# Patient Record
Sex: Female | Born: 1945 | Race: White | Hispanic: No | Smoking: Former smoker
Health system: Southern US, Community
[De-identification: ages and names within clinical notes are randomized; demographics above are authoritative.]

## PROBLEM LIST (undated history)

## (undated) DIAGNOSIS — R011 Cardiac murmur, unspecified: Secondary | ICD-10-CM

## (undated) DIAGNOSIS — M1990 Unspecified osteoarthritis, unspecified site: Secondary | ICD-10-CM

## (undated) DIAGNOSIS — E785 Hyperlipidemia, unspecified: Secondary | ICD-10-CM

## (undated) DIAGNOSIS — F419 Anxiety disorder, unspecified: Secondary | ICD-10-CM

## (undated) DIAGNOSIS — J439 Emphysema, unspecified: Secondary | ICD-10-CM

## (undated) DIAGNOSIS — E039 Hypothyroidism, unspecified: Secondary | ICD-10-CM

## (undated) DIAGNOSIS — N840 Polyp of corpus uteri: Secondary | ICD-10-CM

## (undated) DIAGNOSIS — F329 Major depressive disorder, single episode, unspecified: Secondary | ICD-10-CM

## (undated) DIAGNOSIS — H269 Unspecified cataract: Secondary | ICD-10-CM

## (undated) DIAGNOSIS — E669 Obesity, unspecified: Secondary | ICD-10-CM

## (undated) DIAGNOSIS — C55 Malignant neoplasm of uterus, part unspecified: Secondary | ICD-10-CM

## (undated) DIAGNOSIS — J302 Other seasonal allergic rhinitis: Secondary | ICD-10-CM

## (undated) DIAGNOSIS — I10 Essential (primary) hypertension: Secondary | ICD-10-CM

## (undated) DIAGNOSIS — E119 Type 2 diabetes mellitus without complications: Secondary | ICD-10-CM

## (undated) DIAGNOSIS — G4730 Sleep apnea, unspecified: Secondary | ICD-10-CM

## (undated) DIAGNOSIS — F32A Depression, unspecified: Secondary | ICD-10-CM

## (undated) DIAGNOSIS — K589 Irritable bowel syndrome without diarrhea: Secondary | ICD-10-CM

## (undated) HISTORY — DX: Obesity, unspecified: E66.9

## (undated) HISTORY — DX: Type 2 diabetes mellitus without complications: E11.9

## (undated) HISTORY — DX: Unspecified cataract: H26.9

## (undated) HISTORY — DX: Polyp of corpus uteri: N84.0

## (undated) HISTORY — PX: EYE SURGERY: SHX253

## (undated) HISTORY — DX: Major depressive disorder, single episode, unspecified: F32.9

## (undated) HISTORY — DX: Malignant neoplasm of uterus, part unspecified: C55

## (undated) HISTORY — DX: Depression, unspecified: F32.A

## (undated) HISTORY — DX: Hyperlipidemia, unspecified: E78.5

## (undated) HISTORY — DX: Emphysema, unspecified: J43.9

## (undated) HISTORY — DX: Irritable bowel syndrome, unspecified: K58.9

## (undated) HISTORY — DX: Unspecified osteoarthritis, unspecified site: M19.90

---

## 1992-07-15 HISTORY — PX: PLANTAR FASCIA SURGERY: SHX746

## 1999-02-14 ENCOUNTER — Ambulatory Visit (HOSPITAL_COMMUNITY): Admission: RE | Admit: 1999-02-14 | Discharge: 1999-02-14 | Payer: Self-pay | Admitting: Gastroenterology

## 1999-08-31 ENCOUNTER — Other Ambulatory Visit: Admission: RE | Admit: 1999-08-31 | Discharge: 1999-08-31 | Payer: Self-pay | Admitting: Obstetrics and Gynecology

## 2000-07-28 ENCOUNTER — Other Ambulatory Visit: Admission: RE | Admit: 2000-07-28 | Discharge: 2000-07-28 | Payer: Self-pay | Admitting: Obstetrics and Gynecology

## 2001-08-05 ENCOUNTER — Other Ambulatory Visit: Admission: RE | Admit: 2001-08-05 | Discharge: 2001-08-05 | Payer: Self-pay | Admitting: Obstetrics and Gynecology

## 2002-08-05 ENCOUNTER — Other Ambulatory Visit: Admission: RE | Admit: 2002-08-05 | Discharge: 2002-08-05 | Payer: Self-pay | Admitting: Obstetrics and Gynecology

## 2003-09-05 ENCOUNTER — Other Ambulatory Visit: Admission: RE | Admit: 2003-09-05 | Discharge: 2003-09-05 | Payer: Self-pay | Admitting: Obstetrics and Gynecology

## 2004-09-04 ENCOUNTER — Ambulatory Visit (HOSPITAL_COMMUNITY): Admission: RE | Admit: 2004-09-04 | Discharge: 2004-09-04 | Payer: Self-pay | Admitting: Gastroenterology

## 2004-10-29 ENCOUNTER — Other Ambulatory Visit: Admission: RE | Admit: 2004-10-29 | Discharge: 2004-10-29 | Payer: Self-pay | Admitting: Obstetrics and Gynecology

## 2005-11-20 ENCOUNTER — Other Ambulatory Visit: Admission: RE | Admit: 2005-11-20 | Discharge: 2005-11-20 | Payer: Self-pay | Admitting: Obstetrics and Gynecology

## 2005-12-13 ENCOUNTER — Encounter: Payer: Self-pay | Admitting: Family Medicine

## 2006-07-16 ENCOUNTER — Ambulatory Visit: Payer: Self-pay | Admitting: Internal Medicine

## 2006-07-16 LAB — CONVERTED CEMR LAB
Calcium: 9.6 mg/dL (ref 8.4–10.5)
GFR calc non Af Amer: 91 mL/min
Glomerular Filtration Rate, Af Am: 111 mL/min/{1.73_m2}
Glucose, Bld: 93 mg/dL (ref 70–99)
Hemoglobin: 15.9 g/dL — ABNORMAL HIGH (ref 12.0–15.0)
MCV: 90.2 fL (ref 78.0–100.0)
Platelets: 237 10*3/uL (ref 150–400)
RDW: 13.1 % (ref 11.5–14.6)
Sodium: 139 meq/L (ref 135–145)
Total Bilirubin: 0.6 mg/dL (ref 0.3–1.2)
WBC: 6 10*3/uL (ref 4.5–10.5)

## 2006-07-23 ENCOUNTER — Encounter: Admission: RE | Admit: 2006-07-23 | Discharge: 2006-07-23 | Payer: Self-pay | Admitting: Internal Medicine

## 2007-01-13 ENCOUNTER — Ambulatory Visit: Payer: Self-pay | Admitting: Family Medicine

## 2007-01-13 DIAGNOSIS — G454 Transient global amnesia: Secondary | ICD-10-CM

## 2007-01-13 DIAGNOSIS — E039 Hypothyroidism, unspecified: Secondary | ICD-10-CM

## 2007-01-13 DIAGNOSIS — F329 Major depressive disorder, single episode, unspecified: Secondary | ICD-10-CM | POA: Insufficient documentation

## 2007-01-13 DIAGNOSIS — Z9189 Other specified personal risk factors, not elsewhere classified: Secondary | ICD-10-CM

## 2007-01-13 DIAGNOSIS — F418 Other specified anxiety disorders: Secondary | ICD-10-CM | POA: Insufficient documentation

## 2007-01-15 LAB — CONVERTED CEMR LAB
AST: 21 units/L (ref 0–37)
Alkaline Phosphatase: 84 units/L (ref 39–117)
Basophils Absolute: 0 10*3/uL (ref 0.0–0.1)
CO2: 28 meq/L (ref 19–32)
Chloride: 112 meq/L (ref 96–112)
Eosinophils Absolute: 0.1 10*3/uL (ref 0.0–0.6)
Eosinophils Relative: 2.3 % (ref 0.0–5.0)
Free T4: 0.8 ng/dL (ref 0.6–1.6)
GFR calc Af Amer: 111 mL/min
Glucose, Bld: 86 mg/dL (ref 70–99)
HCT: 44.7 % (ref 36.0–46.0)
Hemoglobin: 15.3 g/dL — ABNORMAL HIGH (ref 12.0–15.0)
MCHC: 34.3 g/dL (ref 30.0–36.0)
MCV: 90 fL (ref 78.0–100.0)
Monocytes Relative: 7.6 % (ref 3.0–11.0)
Neutro Abs: 3.2 10*3/uL (ref 1.4–7.7)
Neutrophils Relative %: 61.4 % (ref 43.0–77.0)
Platelets: 209 10*3/uL (ref 150–400)
Potassium: 4 meq/L (ref 3.5–5.1)
RBC: 4.97 M/uL (ref 3.87–5.11)
Sodium: 142 meq/L (ref 135–145)
Total Bilirubin: 0.6 mg/dL (ref 0.3–1.2)
Total CHOL/HDL Ratio: 4.4
Triglycerides: 111 mg/dL (ref 0–149)

## 2007-03-18 ENCOUNTER — Ambulatory Visit: Payer: Self-pay | Admitting: Family Medicine

## 2007-03-23 LAB — CONVERTED CEMR LAB
ALT: 16 units/L (ref 0–35)
Bilirubin, Direct: 0.1 mg/dL (ref 0.0–0.3)
HDL: 48.6 mg/dL (ref >39.0)
LDL Cholesterol: 131 mg/dL — ABNORMAL HIGH (ref 0–99)
TSH: 4.06 microintl units/mL (ref 0.35–5.50)
Total CHOL/HDL Ratio: 4.1
Triglycerides: 99 mg/dL (ref 0–149)
VLDL: 20 mg/dL (ref 0–40)

## 2007-05-04 ENCOUNTER — Telehealth (INDEPENDENT_AMBULATORY_CARE_PROVIDER_SITE_OTHER): Payer: Self-pay | Admitting: *Deleted

## 2007-07-16 HISTORY — PX: BUNIONECTOMY: SHX129

## 2007-08-27 ENCOUNTER — Encounter: Payer: Self-pay | Admitting: Family Medicine

## 2008-01-04 ENCOUNTER — Telehealth (INDEPENDENT_AMBULATORY_CARE_PROVIDER_SITE_OTHER): Payer: Self-pay | Admitting: *Deleted

## 2008-01-12 ENCOUNTER — Ambulatory Visit: Payer: Self-pay | Admitting: Family Medicine

## 2008-01-21 ENCOUNTER — Encounter (INDEPENDENT_AMBULATORY_CARE_PROVIDER_SITE_OTHER): Payer: Self-pay | Admitting: *Deleted

## 2008-01-21 LAB — CONVERTED CEMR LAB
AST: 21 units/L (ref 0–37)
Alkaline Phosphatase: 80 units/L (ref 39–117)
BUN: 14 mg/dL (ref 6–23)
Basophils Absolute: 0 10*3/uL (ref 0.0–0.1)
Basophils Relative: 0.2 % (ref 0.0–1.0)
Bilirubin, Direct: 0.1 mg/dL (ref 0.0–0.3)
CO2: 27 meq/L (ref 19–32)
Calcium: 9.1 mg/dL (ref 8.4–10.5)
Creatinine, Ser: 0.7 mg/dL (ref 0.4–1.2)
Direct LDL: 164.9 mg/dL
Eosinophils Relative: 2.3 % (ref 0.0–5.0)
Glucose, Bld: 106 mg/dL — ABNORMAL HIGH (ref 70–99)
Hemoglobin: 14.7 g/dL (ref 12.0–15.0)
MCHC: 34.5 g/dL (ref 30.0–36.0)
MCV: 92.5 fL (ref 78.0–100.0)
Monocytes Absolute: 0.4 10*3/uL (ref 0.1–1.0)
Neutro Abs: 2.7 10*3/uL (ref 1.4–7.7)
Potassium: 3.9 meq/L (ref 3.5–5.1)
RDW: 13 % (ref 11.5–14.6)
TSH: 6.04 microintl units/mL — ABNORMAL HIGH (ref 0.35–5.50)
Triglycerides: 84 mg/dL (ref 0–149)
VLDL: 17 mg/dL (ref 0–40)

## 2008-01-25 ENCOUNTER — Ambulatory Visit: Payer: Self-pay | Admitting: Family Medicine

## 2008-01-25 DIAGNOSIS — J309 Allergic rhinitis, unspecified: Secondary | ICD-10-CM | POA: Insufficient documentation

## 2008-01-25 DIAGNOSIS — R945 Abnormal results of liver function studies: Secondary | ICD-10-CM

## 2008-01-25 DIAGNOSIS — H9319 Tinnitus, unspecified ear: Secondary | ICD-10-CM | POA: Insufficient documentation

## 2008-01-27 LAB — HM MAMMOGRAPHY: HM Mammogram: NORMAL

## 2008-01-28 ENCOUNTER — Telehealth (INDEPENDENT_AMBULATORY_CARE_PROVIDER_SITE_OTHER): Payer: Self-pay | Admitting: *Deleted

## 2008-04-01 ENCOUNTER — Telehealth (INDEPENDENT_AMBULATORY_CARE_PROVIDER_SITE_OTHER): Payer: Self-pay | Admitting: *Deleted

## 2008-04-05 ENCOUNTER — Ambulatory Visit: Payer: Self-pay | Admitting: Family Medicine

## 2008-04-05 DIAGNOSIS — E785 Hyperlipidemia, unspecified: Secondary | ICD-10-CM | POA: Insufficient documentation

## 2008-04-05 DIAGNOSIS — E1169 Type 2 diabetes mellitus with other specified complication: Secondary | ICD-10-CM | POA: Insufficient documentation

## 2008-04-06 LAB — CONVERTED CEMR LAB
Albumin: 4.1 g/dL (ref 3.5–5.2)
Bilirubin, Direct: 0.1 mg/dL (ref 0.0–0.3)
Total Bilirubin: 0.7 mg/dL (ref 0.3–1.2)
Total CHOL/HDL Ratio: 2.9
Total Protein: 7 g/dL (ref 6.0–8.3)

## 2008-04-07 ENCOUNTER — Encounter (INDEPENDENT_AMBULATORY_CARE_PROVIDER_SITE_OTHER): Payer: Self-pay | Admitting: *Deleted

## 2008-12-13 ENCOUNTER — Telehealth (INDEPENDENT_AMBULATORY_CARE_PROVIDER_SITE_OTHER): Payer: Self-pay | Admitting: *Deleted

## 2009-01-27 ENCOUNTER — Ambulatory Visit: Payer: Self-pay | Admitting: Family Medicine

## 2009-01-27 LAB — CONVERTED CEMR LAB
Bilirubin Urine: NEGATIVE
Blood in Urine, dipstick: NEGATIVE
Ketones, urine, test strip: NEGATIVE
Specific Gravity, Urine: 1.01
WBC Urine, dipstick: NEGATIVE

## 2009-02-07 ENCOUNTER — Ambulatory Visit: Payer: Self-pay | Admitting: Family Medicine

## 2009-02-07 LAB — CONVERTED CEMR LAB
BUN: 14 mg/dL (ref 6–23)
Basophils Absolute: 0 10*3/uL (ref 0.0–0.1)
Basophils Relative: 0.6 % (ref 0.0–3.0)
CO2: 30 meq/L (ref 19–32)
Calcium: 8.8 mg/dL (ref 8.4–10.5)
Chloride: 107 meq/L (ref 96–112)
Cholesterol: 141 mg/dL (ref 0–200)
Creatinine, Ser: 0.7 mg/dL (ref 0.4–1.2)
Eosinophils Absolute: 0.1 10*3/uL (ref 0.0–0.7)
Eosinophils Relative: 2.2 % (ref 0.0–5.0)
GFR calc non Af Amer: 90.52 mL/min (ref >60)
Glucose, Bld: 112 mg/dL — ABNORMAL HIGH (ref 70–99)
HCT: 44.1 % (ref 36.0–46.0)
HDL: 55.6 mg/dL (ref >39.00)
Hemoglobin: 15 g/dL (ref 12.0–15.0)
LDL Cholesterol: 71 mg/dL (ref 0–99)
Lymphocytes Relative: 30.9 % (ref 12.0–46.0)
Lymphs Abs: 1.3 10*3/uL (ref 0.7–4.0)
MCHC: 34.1 g/dL (ref 30.0–36.0)
MCV: 91 fL (ref 78.0–100.0)
Monocytes Absolute: 0.3 10*3/uL (ref 0.1–1.0)
Monocytes Relative: 7.4 % (ref 3.0–12.0)
Neutro Abs: 2.5 10*3/uL (ref 1.4–7.7)
Neutrophils Relative %: 58.9 % (ref 43.0–77.0)
Platelets: 162 10*3/uL (ref 150.0–400.0)
Potassium: 4.4 meq/L (ref 3.5–5.1)
RBC: 4.85 M/uL (ref 3.87–5.11)
RDW: 13.7 % (ref 11.5–14.6)
Sodium: 141 meq/L (ref 135–145)
TSH: 1.43 microintl units/mL (ref 0.35–5.50)
Total CHOL/HDL Ratio: 3
Triglycerides: 72 mg/dL (ref 0.0–149.0)
VLDL: 14.4 mg/dL (ref 0.0–40.0)
WBC: 4.2 10*3/uL — ABNORMAL LOW (ref 4.5–10.5)

## 2009-03-06 ENCOUNTER — Telehealth (INDEPENDENT_AMBULATORY_CARE_PROVIDER_SITE_OTHER): Payer: Self-pay | Admitting: *Deleted

## 2009-05-30 ENCOUNTER — Ambulatory Visit: Payer: Self-pay | Admitting: Family Medicine

## 2009-05-30 DIAGNOSIS — R7309 Other abnormal glucose: Secondary | ICD-10-CM | POA: Insufficient documentation

## 2009-05-31 LAB — CONVERTED CEMR LAB
CO2: 30 meq/L (ref 19–32)
Creatinine, Ser: 0.6 mg/dL (ref 0.4–1.2)
Hgb A1c MFr Bld: 5.8 % (ref 4.6–6.5)

## 2009-12-25 ENCOUNTER — Telehealth (INDEPENDENT_AMBULATORY_CARE_PROVIDER_SITE_OTHER): Payer: Self-pay | Admitting: *Deleted

## 2010-01-11 ENCOUNTER — Ambulatory Visit: Payer: Self-pay | Admitting: Family Medicine

## 2010-01-12 LAB — CONVERTED CEMR LAB
ALT: 19 units/L (ref 0–35)
Albumin: 4 g/dL (ref 3.5–5.2)
Alkaline Phosphatase: 78 units/L (ref 39–117)
BUN: 12 mg/dL (ref 6–23)
Basophils Absolute: 0 10*3/uL (ref 0.0–0.1)
Basophils Relative: 0.4 % (ref 0.0–3.0)
Bilirubin, Direct: 0.2 mg/dL (ref 0.0–0.3)
Creatinine, Ser: 0.7 mg/dL (ref 0.4–1.2)
Eosinophils Absolute: 0.1 10*3/uL (ref 0.0–0.7)
Eosinophils Relative: 2.5 % (ref 0.0–5.0)
Glucose, Bld: 100 mg/dL — ABNORMAL HIGH (ref 70–99)
Hgb A1c MFr Bld: 5.8 % (ref 4.6–6.5)
Lymphs Abs: 1.3 10*3/uL (ref 0.7–4.0)
MCHC: 34.5 g/dL (ref 30.0–36.0)
MCV: 93.3 fL (ref 78.0–100.0)
Potassium: 4.6 meq/L (ref 3.5–5.1)
Sodium: 140 meq/L (ref 135–145)
TSH: 1.67 microintl units/mL (ref 0.35–5.50)
Total CHOL/HDL Ratio: 3
Triglycerides: 88 mg/dL (ref 0.0–149.0)
VLDL: 17.6 mg/dL (ref 0.0–40.0)

## 2010-01-17 ENCOUNTER — Ambulatory Visit: Payer: Self-pay | Admitting: Family Medicine

## 2010-01-17 DIAGNOSIS — F17200 Nicotine dependence, unspecified, uncomplicated: Secondary | ICD-10-CM

## 2010-01-24 ENCOUNTER — Telehealth (INDEPENDENT_AMBULATORY_CARE_PROVIDER_SITE_OTHER): Payer: Self-pay | Admitting: *Deleted

## 2010-02-08 ENCOUNTER — Encounter: Payer: Self-pay | Admitting: Family Medicine

## 2010-02-09 ENCOUNTER — Encounter: Payer: Self-pay | Admitting: Family Medicine

## 2010-03-09 ENCOUNTER — Encounter: Payer: Self-pay | Admitting: Family Medicine

## 2010-03-20 ENCOUNTER — Ambulatory Visit: Payer: Self-pay | Admitting: Family Medicine

## 2010-03-21 LAB — CONVERTED CEMR LAB
Basophils Absolute: 0 10*3/uL (ref 0.0–0.1)
Bilirubin, Direct: 0.1 mg/dL (ref 0.0–0.3)
CO2: 30 meq/L (ref 19–32)
Cholesterol: 141 mg/dL (ref 0–200)
Eosinophils Absolute: 0.1 10*3/uL (ref 0.0–0.7)
Eosinophils Relative: 2.6 % (ref 0.0–5.0)
GFR calc non Af Amer: 100 mL/min (ref >60)
Hgb A1c MFr Bld: 5.9 % (ref 4.6–6.5)
Lymphs Abs: 1.4 10*3/uL (ref 0.7–4.0)
MCV: 92.9 fL (ref 78.0–100.0)
Neutro Abs: 3.3 10*3/uL (ref 1.4–7.7)
RBC: 4.72 M/uL (ref 3.87–5.11)
RDW: 14.7 % — ABNORMAL HIGH (ref 11.5–14.6)
Sodium: 141 meq/L (ref 135–145)
Total CHOL/HDL Ratio: 3
Total Protein: 6.3 g/dL (ref 6.0–8.3)
WBC: 5.3 10*3/uL (ref 4.5–10.5)

## 2010-05-29 ENCOUNTER — Encounter: Payer: Self-pay | Admitting: Family Medicine

## 2010-05-30 ENCOUNTER — Encounter (INDEPENDENT_AMBULATORY_CARE_PROVIDER_SITE_OTHER): Payer: Self-pay | Admitting: *Deleted

## 2010-08-06 ENCOUNTER — Encounter: Payer: Self-pay | Admitting: Family Medicine

## 2010-08-12 LAB — CONVERTED CEMR LAB: Pap Smear: NORMAL

## 2010-08-14 NOTE — Letter (Signed)
Summary: [facility]  [facility]   Imported By: Lanelle Bal 02/16/2010 09:32:39  _____________________________________________________________________  External Attachment:    Type:   Image     Comment:   External Document

## 2010-08-14 NOTE — Assessment & Plan Note (Signed)
Summary: cpx - review lab/cbs   Vital Signs:  Patient profile:   65 year old female Height:      61.5 inches Weight:      256 pounds Temp:     98.7 degrees F oral Pulse rate:   80 / minute Resp:     18 per minute BP sitting:   100 / 72  (left arm)  Vitals Entered By: Jeremy Johann CMA (January 17, 2010 10:08 AM) CC: cpx Comments --no pap --review labs REVIEWED MED LIST, PATIENT AGREED DOSE AND INSTRUCTION CORRECT    History of Present Illness: Pt here for Cpe. Pt state she did have and episode of chest pain , palpatations about 5 years ago but she never had it checked out.  About 7 years ago she had stress test etc but she can not remember cardiologists name.  It was on [employer].  Pt with no complaints.    Hyperlipidemia follow-up      This is a 65 year old woman who presents for Hyperlipidemia follow-up.  The patient denies muscle aches, GI upset, abdominal pain, flushing, itching, constipation, diarrhea, and fatigue.  The patient denies the following symptoms: chest pain/pressure, exercise intolerance, dypsnea, palpitations, syncope, and pedal edema.  Compliance with medications (by patient report) has been near 100%.  Dietary compliance has been good.  The patient reports exercising 3-4X per week.  Adjunctive measures currently used by the patient include ASA, fish oil supplements, and weight reduction.  Pt would like to come off of statin.  No side effects.  She said she has been working hard with diet and exercise.  colonoscopy scheduled for August with Dr Matthias Hughs.  Preventive Screening-Counseling & Management  Alcohol-Tobacco     Alcohol drinks/day: <1     Alcohol type: 3-4-x a year     Smoking Status: current     Smoking Cessation Counseling: YES     Smoke Cessation Stage: precontemplative     Packs/Day: 1.0     Year Started: 1968     Cans of tobacco/week: no     Passive Smoke Exposure: no  Caffeine-Diet-Exercise     Caffeine use/day: 0     Does Patient Exercise:  yes     Type of exercise: curves     Exercise (avg: min/session): 30-60     Times/week: 3  Hep-HIV-STD-Contraception     HIV Risk: no     Dental Visit-last 6 months yes     Dental Care Counseling: not indicated; dental care within six months     SBE monthly: no     SBE Education/Counseling: to perform regular SBE  Safety-Violence-Falls     Seat Belt Use: 100      Sexual History:  currently monogamous.    Current Medications (verified): 1)  Lexapro 20 Mg Tabs (Escitalopram Oxalate) .Marland Kitchen.. 1 By Mouth Once Daily 2)  Fish Oil   Oil (Fish Oil) .Marland Kitchen.. 1 Once Daily 3)  Allegra 180 Mg  Tabs (Fexofenadine Hcl) .Marland Kitchen.. 1 By Mouth Once Daily 4)  Synthroid 75 Mcg  Tabs (Levothyroxine Sodium) .Marland Kitchen.. 1 By Mouth Once Daily 5)  Zocor 20 Mg Tabs (Simvastatin) .Marland Kitchen.. 1 By Mouth At Bedtime 6)  Ambien 10 Mg Tabs (Zolpidem Tartrate) .Marland Kitchen.. 1 By Mouth At Bedtime As Needed 7)  Ubiquinol 100mg  .... Once Daily 8)  Daily Multiple Vitamins  Tabs (Multiple Vitamin) .... Once Daily 9)  Vitamin C 500 Mg Tabs (Ascorbic Acid) .... Take 1 Once Daily 10)  Acacia Seneqol .Marland KitchenMarland KitchenMarland Kitchen  Once Daily 11)  Garlic 320 Mg Caps (Garlic) .... Once Daily 12)  Vitamin E .... Once Daily 13)  Chantix Starting Month Pak 0.5 Mg X 11 & 1 Mg X 42 Tabs (Varenicline Tartrate) .... As Directed  Allergies (verified): No Known Drug Allergies  Past History:  Past Medical History: Last updated: 02/07/2009 Depression Allergic rhinitis Hyperlipidemia  Family History: Last updated: 01/13/2007 F- colon ca  69 M- pancreatic ca 73 PGM-  colon CA MGM- MI  67s Maunt- breast CA  in 30s MAunt-  MI in mid to late 60s  Social History: Last updated: 01/17/2010 Married Current Smoker Alcohol use-yes Drug use-no Regular exercise-no Occupation:  daycare P/T  Risk Factors: Alcohol Use: <1 (01/17/2010) Caffeine Use: 0 (01/17/2010) Exercise: yes (01/17/2010)  Risk Factors: Smoking Status: current (01/17/2010) Packs/Day: 1.0 (01/17/2010) Cans of  tobacco/wk: no (01/17/2010) Passive Smoke Exposure: no (01/17/2010)  Past Surgical History: plantar fascitis -- 1994-- Dr Eulah Pont bunionectomy--left--Dr Duda--2009  Family History: Reviewed history from 01/13/2007 and no changes required. F- colon ca  6 M- pancreatic ca 73 PGM-  colon CA MGM- MI  26s Maunt- breast CA  in 30s MAunt-  MI in mid to late 60s  Social History: Reviewed history from 01/13/2007 and no changes required. 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 Eyes:  Denies blurring, discharge, double vision, eye irritation, eye pain, halos, itching, light sensitivity, red eye, vision loss-1 eye, and vision loss-both eyes; optho-- q1y. ENT:  Denies decreased hearing, difficulty swallowing, ear discharge, earache, hoarseness, nasal congestion, nosebleeds, postnasal drainage, ringing in ears, sinus pressure, and sore throat. CV:  Denies bluish discoloration of lips or nails, chest pain or discomfort, difficulty breathing at night, difficulty breathing while lying down, fainting, fatigue, leg cramps with exertion, lightheadness, near fainting, palpitations, shortness of breath with exertion, swelling of feet, swelling of hands, and weight gain. Resp:  Denies chest discomfort, chest pain with inspiration, cough, coughing up blood, excessive snoring, hypersomnolence, morning headaches, pleuritic, shortness of breath, sputum productive, and wheezing. GI:  Denies abdominal pain, bloody stools, change in bowel habits, constipation, dark tarry stools, diarrhea, excessive appetite, gas, hemorrhoids, indigestion, loss of appetite, and nausea. GU:  Denies abnormal vaginal bleeding, decreased  libido, discharge, dysuria, genital sores, hematuria, incontinence, nocturia, urinary frequency, and urinary hesitancy. MS:  Denies joint pain, joint redness, joint swelling, loss of strength, low back pain, mid back pain, muscle aches, muscle , cramps, muscle weakness, stiffness, and thoracic pain. Derm:  Denies changes in color of skin, changes in nail beds, dryness, excessive perspiration, flushing, hair loss, insect bite(s), itching, lesion(s), poor wound healing, and rash. Neuro:  Denies brief paralysis, difficulty with concentration, disturbances in coordination, falling down, headaches, inability to speak, memory loss, numbness, poor balance, seizures, sensation of room spinning, tingling, tremors, visual disturbances, and weakness. Psych:  Denies alternate hallucination ( auditory/visual), anxiety, depression, easily angered, easily tearful, irritability, mental problems, panic attacks, sense of great danger, suicidal thoughts/plans, thoughts of violence, unusual visions or sounds, and thoughts /plans of harming others. Endo:  Denies cold intolerance, excessive hunger, excessive thirst, excessive urination, heat intolerance, polyuria, and weight change. Allergy:  Denies hives or rash, itching eyes, persistent infections, seasonal allergies, and sneezing.  Physical Exam  General:  Well-developed,well-nourished,in no acute distress; alert,appropriate and cooperative throughout examination   Impression & Recommendations:  Problem # 1:  PREVENTIVE HEALTH CARE (ICD-V70.0)  reviewed labs with pt recheck 3 months ghm utd need records from [facility]  Orders: EKG w/ Interpretation ([address])  Problem # 2:  FASTING HYPERGLYCEMIA (ICD-790.29)  Labs Reviewed: Creat: 0.7 (01/11/2010)     Orders: EKG w/ Interpretation (93000)  Problem # 3:  HYPERLIPIDEMIA (ICD-272.4)  Her updated medication list for this problem includes:    Zocor 20 Mg Tabs (Simvastatin) .Marland Kitchen... 1 by mouth at  bedtime  Labs Reviewed: SGOT: 22 (01/11/2010)   SGPT: 19 (01/11/2010)   HDL:52.50 (01/11/2010), 55.60 (01/27/2009)  LDL:68 (01/11/2010), 71 (01/27/2009)  Chol:138 (01/11/2010), 141 (01/27/2009)  Trig:88.0 (01/11/2010), 72.0 (01/27/2009)  Orders: EKG w/ Interpretation (93000)  Problem # 4:  HYPOTHYROIDISM NOS (ICD-244.9)  Labs Reviewed: TSH: 1.67 (01/11/2010)    HgBA1c: 5.8 (01/11/2010) Chol: 138 (01/11/2010)   HDL: 52.50 (01/11/2010)   LDL: 68 (01/11/2010)   TG: 88.0 (01/11/2010)  Orders: EKG w/ Interpretation (93000)  Problem # 5:  DEPRESSION (ICD-311)  Her updated medication list for this problem includes:    Lexapro 20 Mg Tabs (Escitalopram oxalate) .Marland Kitchen... 1 by mouth once daily  Orders: EKG w/ Interpretation (93000)  Problem # 6:  TOBACCO USE (ICD-305.1)  Her updated medication list for this problem includes:    Chantix Starting Month Pak 0.5 Mg X 11 & 1 Mg X 42 Tabs (Varenicline tartrate) .Marland Kitchen... As directed  Encouraged smoking cessation and discussed different methods for smoking cessation.   Orders: EKG w/ Interpretation (93000)  Complete Medication List: 1)  Lexapro 20 Mg Tabs (Escitalopram oxalate) .Marland Kitchen.. 1 by mouth once daily 2)  Fish Oil Oil (Fish oil) .Marland Kitchen.. 1 once daily 3)  Allegra 180 Mg Tabs (Fexofenadine hcl) .Marland Kitchen.. 1 by mouth once daily 4)  Synthroid 75 Mcg Tabs (levothyroxine Sodium)  .Marland Kitchen.. 1 by mouth once daily 5)  Zocor 20 Mg Tabs (Simvastatin) .Marland Kitchen.. 1 by mouth at bedtime 6)  Ambien 10 Mg Tabs (Zolpidem tartrate) .Marland Kitchen.. 1 by mouth at bedtime as needed 7)  Ubiquinol 100mg   .... Once daily 8)  Daily Multiple Vitamins Tabs (Multiple vitamin) .... Once daily 9)  Vitamin C 500 Mg Tabs (Ascorbic acid) .... Take 1 once daily 10)  Acacia Seneqol  .... Once daily 11)  Garlic 320 Mg Caps (Garlic) .... Once daily 12)  Vitamin E  .... Once daily 13)  Chantix Starting Month Pak 0.5 Mg X 11 & 1 Mg X 42 Tabs (Varenicline tartrate) .... As directed  Other Orders: Tdap =>  92yrs IM ([address]) Zoster (Shingles) Vaccine Live [phone]) Admin 1st Vaccine ([address]) Admin of Any Addtl Vaccine ([address]) Admin 1st Vaccine (State) [phone]) Admin of Any Addtl Vaccine (State) [phone])  Patient Instructions: 1)  fasting labs in 3 months since we decreased zocor dose---- 272.4 790.6  bmp, hgba1c, lipid, hep, cbcd 2)  Please schedule a follow-up appointment in 6 months .  3)  Tobacco is very bad for your health and your loved ones ! You should stop smoking !  4)  Stop smoking tips: Choose a quit date. Cut down before the quit date. Decide what you will do as a substitute when you feel the urge to smoke(gum, toothpick, exercise).  Prescriptions: ZOCOR 20 MG TABS (SIMVASTATIN) 1 by mouth at bedtime  #90 x 3   Entered and Authorized by:   Loreen Freud DO   Signed by:  Loreen Freud DO on 01/17/2010   Method used:   Faxed to ...       [facility] education [facility])       [address] [address]       [place], [place]  [address]       Ph: [phone]       Fax: [phone]   RxID:   [id number] CHANTIX STARTING MONTH PAK 0.5 MG X 11 & 1 MG X 42 TABS (VARENICLINE TARTRATE) as directed  #1 x 0   Entered and Authorized by:   Loreen Freud DO   Signed by:   Loreen Freud DO on 01/17/2010   Method used:   Print then Give to Patient   RxID:   [id number] AMBIEN 10 MG TABS (ZOLPIDEM TARTRATE) 1 by mouth at bedtime as needed  #30 x 0   Entered and Authorized by:   Loreen Freud DO   Signed by:   Loreen Freud DO on 01/17/2010   Method used:   Print then Give to Patient   RxID:   [id number] ZOCOR 20 MG TABS (SIMVASTATIN) 1 by mouth at bedtime  #90 x 3   Entered and Authorized by:   Loreen Freud DO   Signed by:   Loreen Freud DO on 01/17/2010   Method used:   Faxed to ...       [facility] education [facility])       [address] [address]       [place], [place]  [address]       Ph: [phone]       Fax: [phone]   RxID:   [id number] SYNTHROID 75 MCG  TABS (LEVOTHYROXINE SODIUM) 1 by mouth once  daily  #90 x 3   Entered and Authorized by:   Loreen Freud DO   Signed by:   Loreen Freud DO on 01/17/2010   Method used:   Faxed to ...       [facility] education [facility])       [address] [address]       [place], [place]  [address]       Ph: [phone]       Fax: [phone]   RxID:   [id number] LEXAPRO 20 MG TABS (ESCITALOPRAM OXALATE) 1 by mouth once daily  #90 x 3   Entered and Authorized by:   Loreen Freud DO   Signed by:   Loreen Freud DO on 01/17/2010   Method used:   Faxed to ...       [facility] education [facility])       [address] [address]       [place], [place]  [address]       Ph: [phone]       Fax: [phone]   RxID:   [id number] ZOCOR 20 MG TABS (SIMVASTATIN) 1 by mouth at bedtime  #90 x 3   Entered and Authorized by:   Loreen Freud DO   Signed by:   Loreen Freud DO on 01/17/2010   Method used:   Print then Give to Patient   RxID:   [id number]    EKG  Procedure date:  01/17/2010  Findings:      sinus rhythm 68 bpm   TD Result Date:  01/17/2010 TD Result:  given TD Next Due:  10 yr PAP Result Date:  01/25/2009 PAP Result:  normal PAP Next Due:  1 yr Mammogram Result Date:  01/27/2008 Mammogram Result:  normal Mammogram Next Due:  1 yr      Tetanus/Td Vaccine  Vaccine Type: Tdap    Site: right deltoid    Mfr: GlaxoSmithKline    Dose: 0.5 ml    Route: IM    Given by: Jeremy Johann CMA    Exp. Date: 10/07/2011    Lot #: [id number]    VIS given: 06/02/07 version given January 17, 2010.  Zostavax # 1    Vaccine Type: Zostavax    Site: left [place]    Mfr: Merck    Dose: 0.35ml    Route: IM    Given by: Jeremy Johann CMA    Exp. Date: 02/07/2011    Lot #: [id number]    VIS given: 04/26/05 given January 17, 2010.

## 2010-08-14 NOTE — Progress Notes (Signed)
Summary: -result comparison---Waiting on fax  Phone Note Call from Patient Call back at Home Phone [phone]   Summary of Call: Patient left message that Dr. Laury Axon wanted to compare tests from cardiology with new EKG. Patient would like to know what she found when she compared them. Per patient it is ok to leave message on machine if she does not answer.  Please advise. Initial call taken by: Lucious Groves,  January 24, 2010 2:14 PM  Follow-up for Phone Call        did we receive EKG from cardiologist?  I don't see it scanned in yet . Follow-up by: Loreen Freud DO,  January 24, 2010 2:28 PM  Additional Follow-up for Phone Call Additional follow up Details #1::        It is not in patient paper chart, nor e-chart. I do not know who the cardiologist was, ?possibly an [facility]? Left message on machine to call back to office. Lucious Groves CMA  January 24, 2010 3:08 PM  Additional Follow-up by: Lucious Groves CMA,  January 24, 2010 3:07 PM    Additional Follow-up for Phone Call Additional follow up Details #2::    Spoke with patient and it was an Deboraha Sprang MD, Dr. Amil Amen. Patient has already signed release form with their office, and will call them to have it re-faxed to our office.  Lucious Groves CMA,  January 25, 2010 9:55 AM call in am [phone] patricia..............Marland KitchenFelecia Deloach CMA  January 25, 2010 4:49 PM  awaiting return call from patricia to see if she is able to find old ekg due to pt not seen in several years.Marti Sleigh Deloach CMA  January 26, 2010 11:40 AM   Additional Follow-up for Phone Call Additional follow up Details #3:: Details for Additional Follow-up Action Taken: EKG is the same!!   left message to call office..............Marland KitchenFelecia Deloach CMA  January 29, 2010 3:34 PM   DISCUSS WITH PATIENT..................Marland KitchenFelecia Deloach CMA  January 30, 2010 10:19 AM  Additional Follow-up by: Loreen Freud DO,  January 26, 2010 5:01 PM

## 2010-08-14 NOTE — Miscellaneous (Signed)
Summary: Flu/[facility]  [facility]   Imported By: Lanelle Bal 06/08/2010 12:08:51  _____________________________________________________________________  External Attachment:    Type:   Image     Comment:   External Document

## 2010-08-14 NOTE — Procedures (Signed)
Summary: Colonoscopy/[facility]  Colonoscopy/[facility]   Imported By: Lanelle Bal 03/29/2010 10:15:43  _____________________________________________________________________  External Attachment:    Type:   Image     Comment:   External Document

## 2010-08-14 NOTE — Letter (Signed)
Summary: [facility]   Imported By: Lanelle Bal 02/21/2010 08:25:36  _____________________________________________________________________  External Attachment:    Type:   Image     Comment:   External Document

## 2010-08-14 NOTE — Miscellaneous (Signed)
Summary: Immunization Entry   Immunization History:  Influenza Immunization History:    Influenza:  historical @ [facility] (05/29/2010)

## 2010-08-14 NOTE — Progress Notes (Signed)
Summary: lab  Phone Note Call from Patient   Summary of Call: patient has cpx scheduled [id number] --- lab scheduled [phone] --- need lab order Initial call taken by: Okey Regal Spring,  December 25, 2009 12:37 PM  Follow-up for Phone Call        V70.0  244.9  272.9  790.6   bmp, hep, lipid, hgba1cm TSH, CBCD,  UA Follow-up by: Loreen Freud DO,  December 25, 2009 12:40 PM  Additional Follow-up for Phone Call Additional follow up Details #1::        added lab order to appt .Marland KitchenOkey Regal Spring  December 25, 2009 3:02 PM

## 2010-08-15 ENCOUNTER — Encounter: Payer: Self-pay | Admitting: Internal Medicine

## 2010-08-15 ENCOUNTER — Ambulatory Visit (INDEPENDENT_AMBULATORY_CARE_PROVIDER_SITE_OTHER): Payer: 59 | Admitting: Internal Medicine

## 2010-08-15 DIAGNOSIS — R599 Enlarged lymph nodes, unspecified: Secondary | ICD-10-CM | POA: Insufficient documentation

## 2010-08-15 DIAGNOSIS — J029 Acute pharyngitis, unspecified: Secondary | ICD-10-CM | POA: Insufficient documentation

## 2010-08-15 LAB — CONVERTED CEMR LAB: Rapid Strep: NEGATIVE

## 2010-08-16 ENCOUNTER — Encounter: Payer: Self-pay | Admitting: Internal Medicine

## 2010-08-16 ENCOUNTER — Telehealth (INDEPENDENT_AMBULATORY_CARE_PROVIDER_SITE_OTHER): Payer: Self-pay | Admitting: *Deleted

## 2010-08-22 NOTE — Assessment & Plan Note (Signed)
Summary: sore throat   Vital Signs:  Patient profile:   65 year old female Height:      61.5 inches Weight:      228.25 pounds BMI:     42.58 Pulse rate:   82 / minute Pulse rhythm:   regular BP sitting:   132 / 84  (left arm) Cuff size:   large  Vitals Entered By: Army Fossa CMA (August 15, 2010 10:45 AM) CC: Sore throat x 4 days  Comments nasal congestion  [facility] [address]    History of Present Illness: she had a cold at the beginning of the year, lasted 3 weeks, she felt well for one week but then 4 days ago she developed a sore throat. She described a sore throat as intense and worse when she swallows.   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 1 By Mouth Once Daily 2)  Fish Oil   Oil (Fish Oil) .Marland Kitchen.. 1 Once Daily 3)  Allegra 180 Mg  Tabs (Fexofenadine Hcl) .Marland Kitchen.. 1 By Mouth Once Daily 4)  Synthroid 75 Mcg  Tabs (Levothyroxine Sodium) .Marland Kitchen.. 1 By Mouth Once Daily 5)  Zocor 20 Mg Tabs (Simvastatin) .Marland Kitchen.. 1 By Mouth At Bedtime 6)  Ubiquinol 100mg  .... Once Daily 7)  Daily Multiple Vitamins  Tabs (Multiple Vitamin) .... Once Daily 8)  Vitamin C 500 Mg Tabs (Ascorbic Acid) .... Take 1 Once Daily 9)  Acacia Seneqol .... Once Daily 10)  Garlic 320 Mg Caps (Garlic) .... Once Daily 11)  Vitamin E .... Once Daily  Allergies (verified): No Known Drug Allergies  Past History:  Past Medical History: Reviewed history from 02/07/2009 and no changes required. Depression Allergic rhinitis Hyperlipidemia  Past Surgical History: Reviewed history from 01/17/2010 and no changes required. plantar fascitis -- 1994-- Dr  Eulah Pont bunionectomy--left--Dr Duda--2009  Social History: Married Current Smoker-- 1ppd Alcohol use-yes Drug use-no Regular exercise-no Occupation:  daycare P/T  Physical Exam  General:  alert, well-developed, and overweight-appearing.   Head:  face symmetric, no tender to palpation Eyes:  EOMI perla slightly  red conjuntiva L>R Ears:  R ear normal and L ear normal.   Nose:  slightly congested Mouth:  mild redness, no discharge, uvula midline,  pharyngeal walls symmetric,tonsils not enlarged Neck:  she has a  3/4 cm mass at the left jaw line. No attached to deeper structures, nontender Lungs:  Normal respiratory effort, chest expands symmetrically. Lungs are clear to auscultation, no crackles or wheezes. Heart:  normal rate and no murmur.     Impression & Recommendations:  Problem # 1:  SORE THROAT (ICD-462) described the sore throat as severe, exam show only mild redness, strep A neg. Viral pharyngitis? Plan: Throat culture, treat if appropriate Rest, fluids, Tylenol. Call if not better in a few days  Orders: T-Culture, Throat ([id number])  Problem # 2:  LYMPHADENOPATHY (ICD-785.6)  has a three-quarter centimeter lymph node at the left jaw line. 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 Patient states is not worse   Recommend careful self-examination and reassess by  PCP when she comes back (states will see oral surgery May 2012)  Orders: T-Culture, Throat ([id number])  Complete Medication List: 1)  Lexapro 20 Mg Tabs (Escitalopram oxalate) .Marland Kitchen.. 1 by mouth once daily 2)  Fish Oil Oil (Fish oil) .Marland Kitchen.. 1 once daily 3)  Allegra 180 Mg Tabs (Fexofenadine hcl) .Marland Kitchen.. 1 by mouth once daily 4)  Synthroid 75 Mcg Tabs (levothyroxine Sodium)  .Marland Kitchen.. 1 by mouth once daily 5)  Zocor 20 Mg Tabs (Simvastatin) .Marland Kitchen.. 1 by mouth at bedtime 6)  Ubiquinol 100mg   .... Once daily 7)  Daily Multiple Vitamins Tabs  (Multiple vitamin) .... Once daily 8)  Vitamin C 500 Mg Tabs (Ascorbic acid) .... Take 1 once daily 9)  Acacia Seneqol  .... Once daily 10)  Garlic 320 Mg Caps (Garlic) .... Once daily 11)  Vitamin E  .... Once daily   Orders Added: 1)  T-Culture, Throat [[id number]] 2)  Est. Patient Level III [308[address]]    Laboratory Results    Other Tests  Rapid Strep: negative Comments: Army Fossa CMA  August 15, 2010 10:46 AM

## 2010-08-22 NOTE — Progress Notes (Signed)
Summary: ? Pink Eye  Phone Note Call from Patient Call back at Home Phone [phone]   Caller: Patient Summary of Call: Patient called in this morning and said that she thinks that she has pink eye in her left eye. She woke up this morning and it was crusted shut. She said it is red in color and has quite a bit of drainage. She was wondering if we could call something in for this since she was just seen yesterday. Please advise.   Pharmacy: [facility] on [address]  Initial call taken by: Harold Barban,  August 16, 2010 8:20 AM  Follow-up for Phone Call        it may be part of a viral pharyngitis she was seen for yesterday. Recommend cold compresses, the only over-the-counter eyedrops I recommend are  artificial tears. if she gets a lot worse, bring her back. Follow-up by: Nolon Rod. Paz MD,  August 16, 2010 8:49 AM  Additional Follow-up for Phone Call Additional follow up Details #1::        Patient is aware of directions. Additional Follow-up by: Harold Barban,  August 16, 2010 9:05 AM

## 2010-10-09 ENCOUNTER — Encounter: Payer: Self-pay | Admitting: Internal Medicine

## 2010-10-10 ENCOUNTER — Encounter: Payer: Self-pay | Admitting: Internal Medicine

## 2010-10-10 ENCOUNTER — Ambulatory Visit (INDEPENDENT_AMBULATORY_CARE_PROVIDER_SITE_OTHER): Payer: 59 | Admitting: Internal Medicine

## 2010-10-10 DIAGNOSIS — R229 Localized swelling, mass and lump, unspecified: Secondary | ICD-10-CM

## 2010-10-10 DIAGNOSIS — R2240 Localized swelling, mass and lump, unspecified lower limb: Secondary | ICD-10-CM

## 2010-10-10 NOTE — Assessment & Plan Note (Signed)
Lump in the leg x 6 months, has not change in size, benign features It is hurting some. We agreed she will call if the lump increase in size, the skin over there changes . Also if it continue hurting and likes it excised she will call for a referal

## 2010-10-10 NOTE — Progress Notes (Signed)
  Subjective:    Patient ID: [name] [name], female    DOB: 19-Jun-1946, 65 y.o.   MRN: [id number]  HPI Become aware of a knot at the L thigh 6 months ago, area has not changed in size but now is hurting some     Review of Systems No rash , redness or fluctuancy at the area of concer No injury but has started to exercise more    Past Medical History  Diagnosis Date  . Depression   . Allergic rhinitis   . Hyperlipidemia    Past Surgical History  Procedure Date  . Plantar fascia surgery 1994    Dr Eulah Pont  . Bunionectomy 2009    Dr.Duda, left     Family History  Problem Relation Age of Onset  . Colon cancer Father 51  . Pancreatic cancer Mother 62  . Colon cancer Paternal Grandmother   . Heart attack Maternal Grandmother 50  . Breast cancer Maternal Aunt 30  . Heart attack Maternal Aunt 60      Objective:   Physical Exam  Constitutional: She appears well-developed and well-nourished.  Musculoskeletal:       Legs: Lymphadenopathy:       Right: No inguinal adenopathy present.       Left: No inguinal adenopathy present.          Assessment & Plan:

## 2010-11-30 NOTE — Op Note (Signed)
[name] [name], [name]                ACCOUNT NO.:  [id number]   MEDICAL RECORD NO.:  [id number]          PATIENT TYPE:  AMB   LOCATION:  ENDO                         FACILITY:  [facility]   PHYSICIAN:  Bernette Redbird, M.D.   DATE OF BIRTH:  Dec 04, 1945   DATE OF PROCEDURE:  09/04/2004  DATE OF DISCHARGE:                                 OPERATIVE REPORT   PROCEDURE:  Colonoscopy.   INDICATION:  Family history of colon cancer, with negative screening exam  about 5 years ago.   FINDINGS:  Minimal diverticulosis.   PROCEDURE:  The nature, purpose and risks of the procedure were familiar to  the patient from prior examination and she provided written consent.  Sedation prior to and during the course of the examination totaled Phenergan  12.5 mg IV (Since the patient is on antidepressants, I felt some adjunctive  sedative medication would be helpful), plus fentanyl 112.5 mcg IV and Versed  10 mg IV.   DESCRIPTION OF PROCEDURE:  The Olympus adjustable tension adult video  colonoscope was advanced through a rather spastic sigmoid region with some  discomfort experienced by the patient. Once in the region of the transverse  colon, advancement was easy. The terminal ileum was entered for a short  distance and appeared normal. Pullback was then performed. The quality of  prep was excellent and it is felt that all areas were well seen.   There was some mild sigmoid diverticulosis with associated thickening of the  colonic wall and some stiffness in that region, but I did not see any  polyps, cancer, colitis or vascular malformations. Reinspection of the  rectum was unremarkable with antegrade viewing of the distal rectum being  performed since retroflexion could not be accomplished due to a relatively  small rectal ampulla. No biopsies were obtained. The patient tolerated the  procedure well and there were no apparent complications.   IMPRESSION:  1.  Family history of colon cancer, without  worrisome findings on current      examination (V16.0)  2.  Mild sigmoid diverticulosis.   PLAN:  Follow-up colonoscopy in 5 years.      RB/MEDQ  D:  09/04/2004  T:  09/04/2004  Job:  [id number]   cc:   Hal Morales, M.D.  Fax: [phone]   Sharlet Salina, M.D.  [address]  [name]  [place] [address]  Fax: [phone]

## 2010-11-30 NOTE — Assessment & Plan Note (Signed)
[facility]                        [place] OFFICE NOTE   NAME:[name], [name] ELLISTON                       MRN:          [id number]  DATE:07/16/2006                            DOB:          May 08, 1946    CHIEF COMPLAINT:  Confusion.   HISTORY OF PRESENT ILLNESS:  [name] [name] is a 65 year old white female,  who was seen in the office today for the first time.  She states that,  on December 31, she got confused.  The husband noticed that she kept  asking the same questions and she herself, at the beginning of the  episode, noticed that she was confused.  This lasted a few hours and  then gradually resolved.  She did not get violent and has no  recollection of what happened the last two or three hours of this event.  Denies drinking that day.  Now she is back to normal.  She is not taking  any new medications, nor has she been taking any over-the-counter or  herbal medication by mouth.  She did get some detox herbal patches that  she put on her feet prior to this problem.   PAST MEDICAL HISTORY:  1. History of depression.  2. Status post surgery for plantar fasciitis.  3. She is G0, P0.   FAMILY HISTORY:  1. Aunt had breast cancer.  2. Father and grandmother had colon cancer.  3. A maternal aunt had a stroke.  4. Mother and a cousin have diabetes.  5. No hypertension.  6. Several family members, including her mother, have heart disease.   SOCIAL HISTORY:  The patient is married.  Smokes a pack a day.  She  drinks alcohol very seldom, maybe three or four times a year, and does  not use any drugs.   REVIEW OF SYSTEMS:  Denies any chest pain, shortness of breath or  palpitations.  During the above-described episode, she did not have any  bowel or bladder incontinence, slurred speech, facial droop or seizure  activity.  She did not have any loss of consciousness, dizziness.  She  did have a slight headache after the episode of confusion.   Reports that  her depression is well at the present time with her current medications  and she is under no particular stress.   MEDICINES:  1. Lexapro 30 one p.o. daily.  2. Bupropion XL 300 one p.o. daily.  3. Multivitamins, vitamin C, ginkgo biloba, vitamin B and vitamin E.   ALLERGIES:  No known drug allergies.   PHYSICAL EXAMINATION:  The patient is alert and oriented, in no apparent  distress, cooperative and coherent.  She weighs 262 pounds, afebrile,  pulse 80, blood pressure 126/94.  NECK:  Normal carotid pulses.  LUNGS:  Clear to auscultation bilaterally.  CARDIOVASCULAR:  Regular rate and rhythm without a murmur.  ABDOMEN:  Not distended, soft, good bowel sounds.  No organomegaly.  EXTREMITIES:  No edema.  NEUROLOGICAL EXAM:  Speech is normal and fluent.  External ocular  movements are intact.  Pupils are equal and reactive to light and  accommodation.  Motor reflexes  are symmetric.   ASSESSMENT AND PLAN:  The patient presents today with episode of  confusion/amnesia that lasted a few hours and now is resolved.  She does  not have any focal signs on physical exam, nor did she have any focal  symptoms during this event.  At this point, I would like to do an MRI to  rule out a structural brain problem.  I am also going to do a CBC, TSH  and a comprehensive panel.  At the same time, she will be referred for a  neurological evaluation and, in the meantime, she is to let me know if  these symptoms recur.     [name] Ora, MD  Electronically Signed    JP/MedQ  DD: 07/16/2006  DT: 07/16/2006  Job #: [id number]

## 2010-12-14 HISTORY — PX: PAROTID GLAND TUMOR EXCISION: SHX5221

## 2011-02-20 ENCOUNTER — Encounter: Payer: Self-pay | Admitting: Family Medicine

## 2011-02-20 ENCOUNTER — Ambulatory Visit (INDEPENDENT_AMBULATORY_CARE_PROVIDER_SITE_OTHER): Payer: 59 | Admitting: Family Medicine

## 2011-02-20 DIAGNOSIS — F329 Major depressive disorder, single episode, unspecified: Secondary | ICD-10-CM

## 2011-02-20 DIAGNOSIS — F411 Generalized anxiety disorder: Secondary | ICD-10-CM

## 2011-02-20 DIAGNOSIS — Z0000 Encounter for general adult medical examination without abnormal findings: Secondary | ICD-10-CM

## 2011-02-20 DIAGNOSIS — F3289 Other specified depressive episodes: Secondary | ICD-10-CM

## 2011-02-20 DIAGNOSIS — R7309 Other abnormal glucose: Secondary | ICD-10-CM

## 2011-02-20 DIAGNOSIS — E039 Hypothyroidism, unspecified: Secondary | ICD-10-CM

## 2011-02-20 DIAGNOSIS — F419 Anxiety disorder, unspecified: Secondary | ICD-10-CM

## 2011-02-20 DIAGNOSIS — F17200 Nicotine dependence, unspecified, uncomplicated: Secondary | ICD-10-CM

## 2011-02-20 MED ORDER — LEVOTHYROXINE SODIUM 75 MCG PO TABS: 75.0000 ug | ORAL_TABLET | Freq: Every day | ORAL | Status: DC

## 2011-02-20 MED ORDER — ALPRAZOLAM 0.25 MG PO TABS: 0.2500 mg | ORAL_TABLET | Freq: Three times a day (TID) | ORAL | Status: DC | PRN

## 2011-02-20 MED ORDER — ESCITALOPRAM OXALATE 20 MG PO TABS: 20.0000 mg | ORAL_TABLET | Freq: Every day | ORAL | Status: DC

## 2011-02-20 NOTE — Assessment & Plan Note (Signed)
Check labs 

## 2011-02-20 NOTE — Assessment & Plan Note (Signed)
Check labs con't meds 

## 2011-02-20 NOTE — Progress Notes (Signed)
Subjective:     [name] [name] is a 65 y.o. female and is here for a comprehensive physical exam. The patient reports no problems.  History   Social History  . Marital Status: Married    Spouse Name: N/A    Number of Children: N/A  . Years of Education: N/A   Occupational History  . Daycare p/t    Social History Main Topics  . Smoking status: Current Everyday Smoker -- 1.0 packs/day    Types: Cigarettes  . Smokeless tobacco: Not on file  . Alcohol Use: 0.0 oz/week     rare  . Drug Use: No  . Sexually Active: Yes -- Female partner(s)   Other Topics Concern  . Not on file   Social History Narrative   Regular exercise- no    Health Maintenance  Topic Date Due  . Influenza Vaccine  04/15/2011  . Mammogram  02/20/2012  . Pap Smear  02/20/2012  . Tetanus/tdap  01/18/2020  . Colonoscopy  02/20/2020  . Zostavax  Completed     Review of Systems Review of Systems  Constitutional: Negative for activity change, appetite change and fatigue.  HENT: Negative for hearing loss, congestion, tinnitus and ear discharge.  dentist q1m Eyes: Negative for visual disturbance (see optho q1y -- vision corrected to 20/20 with glasses).  Respiratory: Negative for cough, chest tightness and shortness of breath.   Cardiovascular: Negative for chest pain, palpitations and leg swelling.  Gastrointestinal: Negative for abdominal pain, diarrhea, constipation and abdominal distention.  Genitourinary: Negative for urgency, frequency, decreased urine volume and difficulty urinating.  Musculoskeletal: Negative for back pain, arthralgias and gait problem.  Skin: Negative for color change, pallor and rash.  Neurological: Negative for dizziness, light-headedness, numbness and headaches.  Hematological: Negative for adenopathy. Does not bruise/bleed easily.  Psychiatric/Behavioral: Negative for suicidal ideas, confusion, sleep disturbance, self-injury, dysphoric mood, decreased concentration and agitation.        Objective:  BP 120/80  Pulse 65  Temp(Src) 98.7 F (37.1 C) (Oral)  Ht 5' 1.75" (1.568 m)  Wt 228 lb (103.42 kg)  BMI 42.04 kg/m2  SpO2 92%  General Appearance:    Alert, cooperative, no distress, appears stated age  Head:    Normocephalic, without obvious abnormality, atraumatic  Eyes:    PERRL, conjunctiva/corneas clear, EOM's intact, fundi    benign, both eyes  Ears:    Normal TM's and external ear canals, both ears  Nose:   Nares normal, septum midline, mucosa normal, no drainage    or sinus tenderness  Throat:   Lips, mucosa, and tongue normal; teeth and gums normal  Neck:   Supple, symmetrical, trachea midline, no adenopathy;    thyroid:  no enlargement/tenderness/nodules; no carotid   bruit or JVD  Back:     Symmetric, no curvature, ROM normal, no CVA tenderness  Lungs:     Clear to auscultation bilaterally, respirations unlabored  Chest Wall:    No tenderness or deformity   Heart:    Regular rate and rhythm, S1 and S2 normal, no murmur, rub   or gallop  Breast Exam:    gyn  Abdomen:     Soft, non-tender, bowel sounds active all four quadrants,    no masses, no organomegaly  Genitalia:    gyn  Rectal:    gyn  Extremities:   Extremities normal, atraumatic, no cyanosis or edema  Pulses:   2+ and symmetric all extremities  Skin:   Skin color, texture, turgor normal, no  rashes or lesions  Lymph nodes:   Cervical, supraclavicular, and axillary nodes normal  Neurologic:   CNII-XII intact, normal strength, sensation and reflexes    throughout                                                            Assessment:    Healthy female exam.   Plan:     See After Visit Summary for Counseling Recommendations

## 2011-02-20 NOTE — Assessment & Plan Note (Signed)
con't meds 

## 2011-02-20 NOTE — Assessment & Plan Note (Signed)
Pt will try otc--lozenges Will consider chantix

## 2011-02-20 NOTE — Patient Instructions (Signed)

## 2011-02-20 NOTE — Assessment & Plan Note (Addendum)
Check fasting lab ghm utd Mammo/ pap per gyn

## 2011-02-21 ENCOUNTER — Other Ambulatory Visit: Payer: Self-pay | Admitting: Family Medicine

## 2011-02-21 DIAGNOSIS — R7989 Other specified abnormal findings of blood chemistry: Secondary | ICD-10-CM

## 2011-02-21 DIAGNOSIS — Z0000 Encounter for general adult medical examination without abnormal findings: Secondary | ICD-10-CM

## 2011-02-21 DIAGNOSIS — E039 Hypothyroidism, unspecified: Secondary | ICD-10-CM

## 2011-02-22 ENCOUNTER — Other Ambulatory Visit (INDEPENDENT_AMBULATORY_CARE_PROVIDER_SITE_OTHER): Payer: 59

## 2011-02-22 DIAGNOSIS — Z0000 Encounter for general adult medical examination without abnormal findings: Secondary | ICD-10-CM

## 2011-02-22 DIAGNOSIS — R7309 Other abnormal glucose: Secondary | ICD-10-CM

## 2011-02-22 DIAGNOSIS — E039 Hypothyroidism, unspecified: Secondary | ICD-10-CM

## 2011-02-22 DIAGNOSIS — R7989 Other specified abnormal findings of blood chemistry: Secondary | ICD-10-CM

## 2011-02-22 LAB — BASIC METABOLIC PANEL
CO2: 29 mEq/L (ref 19–32)
Calcium: 8.8 mg/dL (ref 8.4–10.5)
GFR: 166.67 mL/min (ref >60.00)
Sodium: 140 mEq/L (ref 135–145)

## 2011-02-22 LAB — HEPATIC FUNCTION PANEL
Alkaline Phosphatase: 65 U/L (ref 39–117)
Bilirubin, Direct: 0 mg/dL (ref 0.0–0.3)
Total Bilirubin: 0.5 mg/dL (ref 0.3–1.2)

## 2011-02-22 LAB — CBC WITH DIFFERENTIAL/PLATELET
Basophils Absolute: 0 10*3/uL (ref 0.0–0.1)
Basophils Relative: 0.4 % (ref 0.0–3.0)
Eosinophils Absolute: 0.1 10*3/uL (ref 0.0–0.7)
Eosinophils Relative: 2.3 % (ref 0.0–5.0)
HCT: 42.2 % (ref 36.0–46.0)
Lymphocytes Relative: 33.2 % (ref 12.0–46.0)
Lymphs Abs: 1.4 10*3/uL (ref 0.7–4.0)
MCV: 93.6 fl (ref 78.0–100.0)
Neutrophils Relative %: 57.2 % (ref 43.0–77.0)
RDW: 14.2 % (ref 11.5–14.6)

## 2011-02-22 LAB — LIPID PANEL
HDL: 65.7 mg/dL (ref >39.00)
Total CHOL/HDL Ratio: 3
VLDL: 13.4 mg/dL (ref 0.0–40.0)

## 2011-02-22 LAB — POCT URINALYSIS DIPSTICK
Blood, UA: NEGATIVE
Nitrite, UA: NEGATIVE
Urobilinogen, UA: 0.2
pH, UA: 7.5

## 2011-02-22 LAB — TSH: TSH: 1.96 u[IU]/mL (ref 0.35–5.50)

## 2011-02-22 LAB — HEMOGLOBIN A1C: Hgb A1c MFr Bld: 5.6 % (ref 4.6–6.5)

## 2011-02-22 NOTE — Progress Notes (Signed)
Labs only

## 2011-02-25 ENCOUNTER — Telehealth: Payer: Self-pay | Admitting: *Deleted

## 2011-02-25 MED ORDER — SIMVASTATIN 20 MG PO TABS: 20.0000 mg | ORAL_TABLET | Freq: Every day | ORAL | Status: DC

## 2011-02-25 NOTE — Telephone Encounter (Signed)
Pt aware of labs result but states that she had been 1/2 the 20 mg so she has only been taking 10 mg daily. Pt does not want to increase to the 40 mg at this time. Zocor 20 mg sent in to pharmacy.

## 2011-02-25 NOTE — Telephone Encounter (Signed)
Ok

## 2011-02-25 NOTE — Telephone Encounter (Signed)
Cholesterol--- LDL goal < 100, HDL >40, TG < 150. Diet and exercise will increase HDL and decrease LDL and TG. Fish, Fish Oil, Flaxseed oil will also help increase the HDL and decrease Triglycerides. Recheck labs in 3 months.---- Increase zocor 40 mg #30 1 po qhs , 2 refills. 272.4 Lipid, hep,bmp   Left message to call back.

## 2011-02-26 ENCOUNTER — Other Ambulatory Visit: Payer: Self-pay | Admitting: Family Medicine

## 2011-02-26 MED ORDER — SIMVASTATIN 20 MG PO TABS: 20.0000 mg | ORAL_TABLET | Freq: Every day | ORAL | Status: DC

## 2011-02-26 NOTE — Telephone Encounter (Signed)
Was seen today needs to have 90 day supply of cholesterol medication sent to [facility] instead of local pharmacy.

## 2011-04-10 ENCOUNTER — Other Ambulatory Visit: Payer: Self-pay | Admitting: Obstetrics and Gynecology

## 2011-04-15 NOTE — Patient Instructions (Addendum)
   Your procedure is scheduled on: Monday October 8th  Enter through the Main Entrance of [facility] at: 12:30 pm Pick up the phone at the desk and dial [phone] and inform us of your arrival  Please call this number if you have any problems the morning of surgery: [phone]  Remember: Do not eat food after midnight  Do not drink clear liquids after: 10am Take these medicines the morning of surgery with a SIP OF WATER:morning meds  Do not wear jewelry, make-up, or FINGER nail polish Do not wear lotions, powders, or perfumes.  No deodorant Do not shave 48 hours prior to surgery. Do not bring valuables to the hospital. Patients discharged on the day of surgery will not be allowed to drive home.      Remember to use your hibiclens as instructed.Please shower with 1/2 bottle the evening before your surgery and the other 1/2 bottle the morning of surgery.

## 2011-04-17 ENCOUNTER — Encounter (HOSPITAL_COMMUNITY): Payer: Self-pay

## 2011-04-17 ENCOUNTER — Encounter (HOSPITAL_COMMUNITY)
Admission: RE | Admit: 2011-04-17 | Discharge: 2011-04-17 | Disposition: A | Discharge status: Home / Self Care | Payer: 59 | Source: Ambulatory Visit | Attending: Obstetrics and Gynecology | Admitting: Obstetrics and Gynecology

## 2011-04-17 ENCOUNTER — Other Ambulatory Visit: Payer: Self-pay

## 2011-04-17 HISTORY — DX: Other seasonal allergic rhinitis: J30.2

## 2011-04-17 HISTORY — DX: Unspecified osteoarthritis, unspecified site: M19.90

## 2011-04-17 HISTORY — DX: Hypothyroidism, unspecified: E03.9

## 2011-04-17 LAB — CBC
MCV: 91.4 fL (ref 78.0–100.0)
Platelets: 161 10*3/uL (ref 150–400)
RBC: 4.66 MIL/uL (ref 3.87–5.11)
WBC: 4.8 10*3/uL (ref 4.0–10.5)

## 2011-04-24 ENCOUNTER — Encounter (HOSPITAL_COMMUNITY): Payer: Self-pay | Admitting: Obstetrics and Gynecology

## 2011-04-24 ENCOUNTER — Encounter (HOSPITAL_COMMUNITY): Payer: Self-pay | Admitting: Anesthesiology

## 2011-04-24 ENCOUNTER — Other Ambulatory Visit: Payer: Self-pay | Admitting: Obstetrics and Gynecology

## 2011-04-24 ENCOUNTER — Ambulatory Visit (HOSPITAL_COMMUNITY)
Admission: RE | Admit: 2011-04-24 | Discharge: 2011-04-24 | Disposition: A | Discharge status: Home / Self Care | Payer: 59 | Source: Ambulatory Visit | Attending: Obstetrics and Gynecology | Admitting: Obstetrics and Gynecology

## 2011-04-24 ENCOUNTER — Ambulatory Visit (HOSPITAL_COMMUNITY): Payer: 59 | Admitting: Anesthesiology

## 2011-04-24 ENCOUNTER — Encounter (HOSPITAL_COMMUNITY)
Admission: RE | Disposition: A | Discharge status: Home / Self Care | Payer: Self-pay | Source: Ambulatory Visit | Attending: Obstetrics and Gynecology

## 2011-04-24 DIAGNOSIS — N840 Polyp of corpus uteri: Secondary | ICD-10-CM | POA: Insufficient documentation

## 2011-04-24 DIAGNOSIS — Z780 Asymptomatic menopausal state: Secondary | ICD-10-CM | POA: Insufficient documentation

## 2011-04-24 DIAGNOSIS — R9389 Abnormal findings on diagnostic imaging of other specified body structures: Secondary | ICD-10-CM | POA: Diagnosis present

## 2011-04-24 HISTORY — PX: HYSTEROSCOPY WITH D & C: SHX1775

## 2011-04-24 HISTORY — PX: POLYPECTOMY: SHX5525

## 2011-04-24 SURGERY — DILATATION AND CURETTAGE /HYSTEROSCOPY
Anesthesia: General | Site: Uterus | Wound class: Clean Contaminated

## 2011-04-24 MED ORDER — LACTATED RINGERS IV SOLN
INTRAVENOUS | Status: DC
  Administered 2011-04-24: 08:00:00 via INTRAVENOUS
  Administered 2011-04-24: 1000 mL via INTRAVENOUS

## 2011-04-24 MED ORDER — FENTANYL CITRATE 0.05 MG/ML IJ SOLN
INTRAMUSCULAR | Status: AC
  Administered 2011-04-24: 50 ug via INTRAVENOUS
  Filled 2011-04-24: qty 2

## 2011-04-24 MED ORDER — MIDAZOLAM HCL 2 MG/2ML IJ SOLN
INTRAMUSCULAR | Status: AC
  Filled 2011-04-24: qty 2

## 2011-04-24 MED ORDER — LIDOCAINE HCL 2 % IJ SOLN
INTRAMUSCULAR | Status: DC | PRN
  Administered 2011-04-24: 10 mL

## 2011-04-24 MED ORDER — PROPOFOL 10 MG/ML IV EMUL
INTRAVENOUS | Status: AC
  Filled 2011-04-24: qty 20

## 2011-04-24 MED ORDER — FENTANYL CITRATE 0.05 MG/ML IJ SOLN
INTRAMUSCULAR | Status: AC
Start: 2011-04-24 — End: 2011-04-24
  Filled 2011-04-24: qty 2

## 2011-04-24 MED ORDER — PROPOFOL 10 MG/ML IV EMUL
INTRAVENOUS | Status: DC | PRN
  Administered 2011-04-24: 200 mg via INTRAVENOUS

## 2011-04-24 MED ORDER — FENTANYL CITRATE 0.05 MG/ML IJ SOLN
INTRAMUSCULAR | Status: DC | PRN
  Administered 2011-04-24 (×2): 50 ug via INTRAVENOUS

## 2011-04-24 MED ORDER — LIDOCAINE HCL (CARDIAC) 20 MG/ML IV SOLN
INTRAVENOUS | Status: AC
  Filled 2011-04-24: qty 5

## 2011-04-24 MED ORDER — IBUPROFEN 200 MG PO TABS: 600.0000 mg | ORAL_TABLET | Freq: Four times a day (QID) | ORAL | Status: AC

## 2011-04-24 MED ORDER — FENTANYL CITRATE 0.05 MG/ML IJ SOLN
1.0000 ug/kg | INTRAMUSCULAR | Status: DC | PRN
  Administered 2011-04-24 (×2): 50 ug via INTRAVENOUS

## 2011-04-24 MED ORDER — KETOROLAC TROMETHAMINE 30 MG/ML IJ SOLN
INTRAMUSCULAR | Status: DC | PRN
  Administered 2011-04-24: 30 mg via INTRAMUSCULAR
  Administered 2011-04-24: 30 mg via INTRAVENOUS

## 2011-04-24 MED ORDER — MIDAZOLAM HCL 5 MG/5ML IJ SOLN
INTRAMUSCULAR | Status: DC | PRN
  Administered 2011-04-24: 2 mg via INTRAVENOUS

## 2011-04-24 MED ORDER — LIDOCAINE HCL (CARDIAC) 20 MG/ML IV SOLN
INTRAVENOUS | Status: DC | PRN
  Administered 2011-04-24: 50 mg via INTRAVENOUS

## 2011-04-24 MED ORDER — ONDANSETRON HCL 4 MG/2ML IJ SOLN
INTRAMUSCULAR | Status: AC
  Filled 2011-04-24: qty 2

## 2011-04-24 MED ORDER — KETOROLAC TROMETHAMINE 60 MG/2ML IM SOLN
INTRAMUSCULAR | Status: AC
  Filled 2011-04-24: qty 2

## 2011-04-24 MED ORDER — GLYCINE 1.5 % IR SOLN
Status: DC | PRN
  Administered 2011-04-24: 3000 mL

## 2011-04-24 MED ORDER — ONDANSETRON HCL 4 MG/2ML IJ SOLN
INTRAMUSCULAR | Status: DC | PRN
  Administered 2011-04-24: 4 mg via INTRAVENOUS

## 2011-04-24 SURGICAL SUPPLY — 21 items
BOOTIES KNEE HIGH SLOAN (MISCELLANEOUS) ×4 IMPLANT
CANISTER SUCTION 2500CC (MISCELLANEOUS) ×2 IMPLANT
CATH ROBINSON RED A/P 16FR (CATHETERS) ×2 IMPLANT
CLOTH BEACON ORANGE TIMEOUT ST (SAFETY) ×2 IMPLANT
CONTAINER PREFILL 10% NBF 60ML (FORM) ×3 IMPLANT
CORD ACTIVE DISPOSABLE (ELECTRODE)
CORD ELECTRO ACTIVE DISP (ELECTRODE) IMPLANT
DILATOR CANAL MILEX (MISCELLANEOUS) ×1 IMPLANT
DRAPE UTILITY XL STRL (DRAPES) ×2 IMPLANT
ELECT REM PT RETURN 9FT ADLT (ELECTROSURGICAL) ×2
ELECT VAPORTRODE GRVD BAR (ELECTRODE) IMPLANT
ELECTRODE REM PT RTRN 9FT ADLT (ELECTROSURGICAL) IMPLANT
ELECTRODE ROLLER VERSAPOINT (ELECTRODE) IMPLANT
ELECTRODE RT ANGLE VERSAPOINT (CUTTING LOOP) IMPLANT
ELECTRODE TWIZZLE TIP (MISCELLANEOUS) IMPLANT
GLOVE SURG SS PI 6.5 STRL IVOR (GLOVE) ×4 IMPLANT
GOWN PREVENTION PLUS LG XLONG (DISPOSABLE) ×4 IMPLANT
LOOP ANGLED CUTTING 22FR (CUTTING LOOP) IMPLANT
PACK HYSTEROSCOPY LF (CUSTOM PROCEDURE TRAY) ×2 IMPLANT
TOWEL OR 17X24 6PK STRL BLUE (TOWEL DISPOSABLE) ×4 IMPLANT
WATER STERILE IRR 1000ML POUR (IV SOLUTION) ×2 IMPLANT

## 2011-04-24 NOTE — H&P (Signed)
[name], [name]                ACCOUNT NO.:  [id number]  MEDICAL RECORD NO.:  [id number]  LOCATION:                                 FACILITY:  PHYSICIAN:  Hal Morales, M.D.DATE OF BIRTH:  1946/07/07  DATE OF ADMISSION: DATE OF DISCHARGE:                             HISTORY & PHYSICAL   HISTORY OF PRESENT ILLNESS:  The patient is a 65 year old white married female, para 0, who presents for further evaluation of postmenopausal bleeding.  The patient underwent menopause in 2003 and has had absolutely no vaginal bleeding up until June 2012, when she bled for 1 day with abdominal cramps.  A pelvic ultrasound that was performed showed normal uterus and ovaries bilaterally with no free fluid and no pelvic masses.  There was minimal fluid seen within the uterus and the endometrial thickness was 7.1 mm.  It was recommended that the patient undergo an endometrial biopsy for evaluation of endometrial tissue, however, the patient declined endometrial biopsy and stated she would prefer having a hysteroscopic evaluation of her endometrial cancer and D and C for workup of her lesion.  She has been given Provera in August 2012 after the ultrasound findings and had absolutely no bleeding from the Provera.  She is set up for a hysteroscopy D and C.  PAST MEDICAL HISTORY:  Hypothyroidism and hypercholesterolemia.  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  FAMILY HISTORY:  Positive for diabetes, heart disease, stroke, colorectal cancer, pancreatic cancer, and melanoma.  CURRENT MEDICATIONS: 1. Unithroid 75 mcg daily. 2. Lexapro 20 mg daily. 3. Simvastatin 20 mg daily.  DRUG SENSITIVITIES:  None known.  REVIEW OF SYSTEMS:  Essentially negative except as mentioned above.  The patient has suffered from depression for many years that has been well controlled with her current dose of  Lexapro.  PHYSICAL EXAMINATION:  GENERAL:  The patient is an obese white female in no acute distress. VITAL SIGNS:  Temperature 97.9, pulse 76, respirations 12, weight is 228 pounds down from 261 pounds at her last check 2 years ago. LUNGS:  Clear. HEART:  Regular rate and rhythm. ABDOMEN:  Obese without palpable masses or organomegaly, though The examination is compromised by the patient's habitus. GU:  EGBUS is within normal limits.  The vagina is rugous.  The cervix is without gross lesions.  The uterus is not enlarged, but the exam is compromised by the patient's habitus.  Adnexa, no masses.  Rectovaginal, no masses.  IMPRESSION: 1. One single episode of postmenopausal bleeding. 2. 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 3. Declined endometrial biopsy. 4. Hypothyroidism. 5. Hypercholesterolemia. 6. Depression.  DISPOSITION:  The patient will undergo hysteroscopy, D and C at [facility] on April 24, 2011.  The risks of anesthesia, bleeding, infection, damage to adjacent organs have all been reviewed and the patient wishes to proceed.     Hal Morales, M.D.     VPH/MEDQ  D:  04/23/2011  T:  04/24/2011  Job:  [id number]

## 2011-04-24 NOTE — Op Note (Signed)
04/24/2011  9:39 AM  PATIENT:  [name] [name]  65 y.o. female  PRE-OPERATIVE DIAGNOSIS:  thickend endometrium, status post menopause  POST-OPERATIVE DIAGNOSIS:  thickend endometrium, status post menopause  PROCEDURE:  Procedure(s): DILATATION AND CURETTAGE (D&C) /HYSTEROSCOPY POLYPECTOMY  SURGEON:  Corrina Steffensen P, MD  ASSISTANTS: N/a  ANESTHESIA:   general  ESTIMATED BLOOD LOSS: * No blood loss amount entered *   BLOOD ADMINISTERED:none  COMPLICATIONS:none FINDINGS: Uterus sounded to 6 cm. Small endometrial polyp on the Left cavity sidewall.  Both tubal ostia visualized  FLUID DEFICIT:60cc  LOCAL MEDICATIONS USED:  LIDOCAINE 10CC  SPECIMEN:  Endometrial polyp and scant currettings  DISPOSITION OF SPECIMEN:  PATHOLOGY  COUNTS:  YES CORRECT  DESCRIPTION OF PROCEDURE:the patient was taken to the operating room after appropriate identification and placed on the operating table. After the attainment of adequate general anesthesia she was placed in the lithotomy position. The perineum and vagina were prepped with multiple layers of Betadine. The bladder was emptied with a an in and out catheter. The perineum was draped in sterile field. A Graves speculum was placed in the vagina. The cervix was grasped with a single-tooth tenaculum. A paracervical block was achieved with a total of 10 cc of 2% Xylocaine and the 5 and 7:00 positions. The uterus was sounded.  The cervix was then dilated to accommodate the diagnostic hysteroscope. The hysteroscope was used to evaluate all quadrants of the uterus. The above noted findings were made and documented.  The Randall stone forceps were used to grasp the endometrial polyp, then all quadrants of the uterus were curretted with a sharp currette. The patient was given Toradol 30 mg IV and 30 mg IM. All instruments were then removed from the vagina and the patient was awakened from general anesthesia and taken to the recovery room in  satisfactory condition having tolerated the procedure well sponge and instrument counts correct.  PLAN OF CARE: Discharge home  PATIENT DISPOSITION:  PACU - hemodynamically stable.   Delay start of Pharmacological VTE agent due to early ambulation.   Hal Morales, MD 9:39 AM

## 2011-04-24 NOTE — Transfer of Care (Signed)
Immediate Anesthesia Transfer of Care Note  Patient: [name]  Procedure(s) Performed:  DILATATION AND CURETTAGE (D&C) /HYSTEROSCOPY; POLYPECTOMY  Patient Location: PACU  Anesthesia Type: General  Level of Consciousness: awake, alert  and oriented  Airway & Oxygen Therapy: Patient Spontanous Breathing and Patient connected to nasal cannula oxygen  Post-op Assessment: Report given to PACU RN and Post -op Vital signs reviewed and stable  Post vital signs: Reviewed and stable  Complications: No apparent anesthesia complications

## 2011-04-24 NOTE — H&P (Signed)
  History reviewed.  Exam performed and confirmed no change.

## 2011-04-24 NOTE — Anesthesia Preprocedure Evaluation (Signed)
Anesthesia Evaluation  Name, MR# and DOB Patient awake  General Assessment Comment  Reviewed: Allergy & Precautions, H&P , NPO status , Patient's Chart, lab work & pertinent test results  Airway Mallampati: I TM Distance: >3 FB Neck ROM: Full    Dental No notable dental hx.    Pulmonary sleep apnea    Pulmonary exam normal       Cardiovascular     Neuro/Psych Negative Neurological ROS     GI/Hepatic negative GI ROS Neg liver ROS    Endo/Other  Morbid obesity  Renal/GU negative Renal ROS  Genitourinary negative   Musculoskeletal negative musculoskeletal ROS (+)   Abdominal Normal abdominal exam  (+)   Peds negative pediatric ROS (+)  Hematology negative hematology ROS (+)   Anesthesia Other Findings   Reproductive/Obstetrics negative OB ROS                           Anesthesia Physical Anesthesia Plan  ASA: III  Anesthesia Plan: General   Post-op Pain Management:    Induction: Intravenous  Airway Management Planned: LMA  Additional Equipment:   Intra-op Plan:   Post-operative Plan:   Informed Consent: I have reviewed the patients History and Physical, chart, labs and discussed the procedure including the risks, benefits and alternatives for the proposed anesthesia with the patient or authorized representative who has indicated his/her understanding and acceptance.     Plan Discussed with: CRNA  Anesthesia Plan Comments:         Anesthesia Quick Evaluation

## 2011-04-25 NOTE — Anesthesia Postprocedure Evaluation (Signed)
  Anesthesia Post-op Note  Patient: [name]  Procedure(s) Performed:  DILATATION AND CURETTAGE (D&C) /HYSTEROSCOPY; POLYPECTOMY   Patient is awake, responsive, moving her legs, and has signs of resolution of her numbness. Pain and nausea are reasonably well controlled. Vital signs are stable and clinically acceptable. Oxygen saturation is clinically acceptable. There are no apparent anesthetic complications at this time. Patient is ready for discharge.

## 2011-04-28 ENCOUNTER — Encounter (HOSPITAL_COMMUNITY): Payer: Self-pay | Admitting: Obstetrics and Gynecology

## 2011-06-12 ENCOUNTER — Other Ambulatory Visit: Payer: Self-pay | Admitting: Family Medicine

## 2011-06-12 DIAGNOSIS — E785 Hyperlipidemia, unspecified: Secondary | ICD-10-CM

## 2011-06-13 ENCOUNTER — Other Ambulatory Visit (INDEPENDENT_AMBULATORY_CARE_PROVIDER_SITE_OTHER): Payer: 59

## 2011-06-13 DIAGNOSIS — E785 Hyperlipidemia, unspecified: Secondary | ICD-10-CM

## 2011-06-13 LAB — BASIC METABOLIC PANEL
GFR: 109.4 mL/min (ref >60.00)
Potassium: 4.1 mEq/L (ref 3.5–5.1)
Sodium: 139 mEq/L (ref 135–145)

## 2011-06-13 LAB — LIPID PANEL
Cholesterol: 216 mg/dL — ABNORMAL HIGH (ref 0–200)
HDL: 75.1 mg/dL (ref >39.00)
Total CHOL/HDL Ratio: 3
Triglycerides: 70 mg/dL (ref 0.0–149.0)
VLDL: 14 mg/dL (ref 0.0–40.0)

## 2011-06-13 LAB — HEPATIC FUNCTION PANEL
ALT: 22 U/L (ref 0–35)
Albumin: 4.3 g/dL (ref 3.5–5.2)
Total Protein: 7.1 g/dL (ref 6.0–8.3)

## 2011-06-13 LAB — LDL CHOLESTEROL, DIRECT: Direct LDL: 129.1 mg/dL

## 2011-06-13 NOTE — Progress Notes (Signed)
12  

## 2011-06-14 ENCOUNTER — Other Ambulatory Visit: Payer: 59

## 2011-06-28 MED ORDER — SIMVASTATIN 40 MG PO TABS
40.0000 mg | ORAL_TABLET | Freq: Every evening | ORAL | Status: DC
Start: 2011-06-28 — End: 2012-01-28

## 2011-07-24 LAB — HM PAP SMEAR

## 2011-11-18 ENCOUNTER — Other Ambulatory Visit: Payer: Self-pay | Admitting: Family Medicine

## 2011-11-18 ENCOUNTER — Ambulatory Visit (INDEPENDENT_AMBULATORY_CARE_PROVIDER_SITE_OTHER): Payer: 59 | Admitting: Family Medicine

## 2011-11-18 ENCOUNTER — Encounter: Payer: Self-pay | Admitting: Family Medicine

## 2011-11-18 VITALS — BP 124/82 | HR 77 | Temp 98.7°F | Ht 61.75 in | Wt 255.2 lb

## 2011-11-18 DIAGNOSIS — E785 Hyperlipidemia, unspecified: Secondary | ICD-10-CM

## 2011-11-18 DIAGNOSIS — E039 Hypothyroidism, unspecified: Secondary | ICD-10-CM

## 2011-11-18 LAB — HEPATIC FUNCTION PANEL
ALT: 18 U/L (ref 0–35)
AST: 20 U/L (ref 0–37)
Bilirubin, Direct: 0 mg/dL (ref 0.0–0.3)
Total Bilirubin: 0.5 mg/dL (ref 0.3–1.2)

## 2011-11-18 LAB — BASIC METABOLIC PANEL
Chloride: 104 mEq/L (ref 96–112)
Creatinine, Ser: 0.7 mg/dL (ref 0.4–1.2)
Potassium: 4 mEq/L (ref 3.5–5.1)
Sodium: 141 mEq/L (ref 135–145)

## 2011-11-18 LAB — LIPID PANEL
Cholesterol: 165 mg/dL (ref 0–200)
LDL Cholesterol: 87 mg/dL (ref 0–99)
VLDL: 17.4 mg/dL (ref 0.0–40.0)

## 2011-11-18 MED ORDER — LEVOTHYROXINE SODIUM 75 MCG PO TABS: 75.0000 ug | ORAL_TABLET | Freq: Every day | ORAL | Status: DC

## 2011-11-18 NOTE — Patient Instructions (Signed)
Cholesterol Cholesterol is a white, waxy, fat-like protein needed by your body in small amounts. The liver makes all the cholesterol you need. It is carried from the liver by the blood through the blood vessels. Deposits (plaque) may build up on blood vessel walls. This makes the arteries narrower and stiffer. Plaque increases the risk for heart attack and stroke. You cannot feel your cholesterol level even if it is very high. The only way to know is by a blood test to check your lipid (fats) levels. Once you know your cholesterol levels, you should keep a record of the test results. Work with your caregiver to to keep your levels in the desired range. WHAT THE RESULTS MEAN:  Total cholesterol is a rough measure of all the cholesterol in your blood.   LDL is the so-called bad cholesterol. This is the type that deposits cholesterol in the walls of the arteries. You want this level to be low.   HDL is the good cholesterol because it cleans the arteries and carries the LDL away. You want this level to be high.   Triglycerides are fat that the body can either burn for energy or store. High levels are closely linked to heart disease.  DESIRED LEVELS:  Total cholesterol below 200.   LDL below 100 for people at risk, below 70 for very high risk.   HDL above 50 is good, above 60 is best.   Triglycerides below 150.  HOW TO LOWER YOUR CHOLESTEROL:  Diet.   Choose fish or white meat chicken and [place], roasted or baked. Limit fatty cuts of red meat, fried foods, and processed meats, such as sausage and lunch meat.   Eat lots of fresh fruits and vegetables. Choose whole grains, beans, pasta, potatoes and cereals.   Use only small amounts of olive, corn or canola oils. Avoid butter, mayonnaise, shortening or palm kernel oils. Avoid foods with trans-fats.   Use skim/nonfat milk and low-fat/nonfat yogurt and cheeses. Avoid whole milk, cream, ice cream, egg yolks and cheeses. Healthy desserts include  angel food cake, ginger snaps, animal crackers, hard candy, popsicles, and low-fat/nonfat frozen yogurt. Avoid pastries, cakes, pies and cookies.   Exercise.   A regular program helps decrease LDL and raises HDL.   Helps with weight control.   Do things that increase your activity level like gardening, walking, or taking the stairs.   Medication.   May be prescribed by your caregiver to help lowering cholesterol and the risk for heart disease.   You may need medicine even if your levels are normal if you have several risk factors.  HOME CARE INSTRUCTIONS   Follow your diet and exercise programs as suggested by your caregiver.   Take medications as directed.   Have blood work done when your caregiver feels it is necessary.  MAKE SURE YOU:   Understand these instructions.   Will watch your condition.   Will get help right away if you are not doing well or get worse.  Document Released: 03/26/2001 Document Revised: 06/20/2011 Document Reviewed: 09/16/2007 [facility] Patient Information 2012 [place], [place].  Smoking Cessation, Tips for Success YOU CAN QUIT SMOKING If you are ready to quit smoking, congratulations! You have chosen to help yourself be healthier. Cigarettes bring nicotine, tar, carbon monoxide, and other irritants into your body. Your lungs, heart, and blood vessels will be able to work better without these poisons. There are many different ways to quit smoking. Nicotine gum, nicotine patches, a nicotine inhaler, or nicotine  nasal spray can help with physical craving. Hypnosis, support groups, and medicines help break the habit of smoking. Here are some tips to help you quit for good.  Throw away all cigarettes.   Clean and remove all ashtrays from your home, work, and car.   On a card, write down your reasons for quitting. Carry the card with you and read it when you get the urge to smoke.   Cleanse your body of nicotine. Drink enough water and fluids to keep your  urine clear or pale yellow. Do this after quitting to flush the nicotine from your body.   Learn to predict your moods. Do not let a bad situation be your excuse to have a cigarette. Some situations in your life might tempt you into wanting a cigarette.   Never have "just one" cigarette. It leads to wanting another and another. Remind yourself of your decision to quit.   Change habits associated with smoking. If you smoked while driving or when feeling stressed, try other activities to replace smoking. Stand up when drinking your coffee. Brush your teeth after eating. Sit in a different chair when you read the paper. Avoid alcohol while trying to quit, and try to drink fewer caffeinated beverages. Alcohol and caffeine may urge you to smoke.   Avoid foods and drinks that can trigger a desire to smoke, such as sugary or spicy foods and alcohol.   Ask people who smoke not to smoke around you.   Have something planned to do right after eating or having a cup of coffee. Take a walk or exercise to perk you up. This will help to keep you from overeating.   Try a relaxation exercise to calm you down and decrease your stress. Remember, you may be tense and nervous for the first 2 weeks after you quit, but this will pass.   Find new activities to keep your hands busy. Play with a pen, coin, or rubber band. Doodle or draw things on paper.   Brush your teeth right after eating. This will help cut down on the craving for the taste of tobacco after meals. You can try mouthwash, too.   Use oral substitutes, such as lemon drops, carrots, a cinnamon stick, or chewing gum, in place of cigarettes. Keep them handy so they are available when you have the urge to smoke.   When you have the urge to smoke, try deep breathing.   Designate your home as a nonsmoking area.   If you are a heavy smoker, ask your caregiver about a prescription for nicotine chewing gum. It can ease your withdrawal from nicotine.    Reward yourself. Set aside the cigarette money you save and buy yourself something nice.   Look for support from others. Join a support group or smoking cessation program. Ask someone at home or at work to help you with your plan to quit smoking.   Always ask yourself, "Do I need this cigarette or is this just a reflex?" Tell yourself, "Today, I choose not to smoke," or "I do not want to smoke." You are reminding yourself of your decision to quit, even if you do smoke a cigarette.  HOW WILL I FEEL WHEN I QUIT SMOKING?  The benefits of not smoking start within days of quitting.   You may have symptoms of withdrawal because your body is used to nicotine (the addictive substance in cigarettes). You may crave cigarettes, be irritable, feel very hungry, cough often, get headaches, or  have difficulty concentrating.   The withdrawal symptoms are only temporary. They are strongest when you first quit but will go away within 10 to 14 days.   When withdrawal symptoms occur, stay in control. Think about your reasons for quitting. Remind yourself that these are signs that your body is healing and getting used to being without cigarettes.   Remember that withdrawal symptoms are easier to treat than the major diseases that smoking can cause.   Even after the withdrawal is over, expect periodic urges to smoke. However, these cravings are generally short-lived and will go away whether you smoke or not. Do not smoke!   If you relapse and smoke again, do not lose hope. Most smokers quit 3 times before they are successful.   If you relapse, do not give up! Plan ahead and think about what you will do the next time you get the urge to smoke.  LIFE AS A NONSMOKER: MAKE IT FOR A MONTH, MAKE IT FOR LIFE Day 1: Hang this page where you will see it every day. Day 2: Get rid of all ashtrays, matches, and lighters. Day 3: Drink water. Breathe deeply between sips. Day 4: Avoid places with smoke-filled air, such as  bars, clubs, or the smoking section of restaurants. Day 5: Keep track of how much money you save by not smoking. Day 6: Avoid boredom. Keep a good book with you or go to the movies. Day 7: Reward yourself! One week without smoking! Day 8: Make a dental appointment to get your teeth cleaned. Day 9: Decide how you will turn down a cigarette before it is offered to you. Day 10: Review your reasons for quitting. Day 11: Distract yourself. Stay active to keep your mind off smoking and to relieve tension. Take a walk, exercise, read a book, do a crossword puzzle, or try a new hobby. Day 12: Exercise. Get off the bus before your stop or use stairs instead of escalators. Day 13: Call on friends for support and encouragement. Day 14: Reward yourself! Two weeks without smoking! Day 15: Practice deep breathing exercises. Day 16: Bet a friend that you can stay a nonsmoker. Day 17: Ask to sit in nonsmoking sections of restaurants. Day 18: Hang up "No Smoking" signs. Day 19: Think of yourself as a nonsmoker. Day 20: Each morning, tell yourself you will not smoke. Day 21: Reward yourself! Three weeks without smoking! Day 22: Think of smoking in negative ways. Remember how it stains your teeth, gives you bad breath, and leaves you short of breath. Day 23: Eat a nutritious breakfast. Day 24:Do not relive your days as a smoker. Day 25: Hold a pencil in your hand when talking on the telephone. Day 26: Tell all your friends you do not smoke. Day 27: Think about how much better food tastes. Day 28: Remember, one cigarette is one too many. Day 29: Take up a hobby that will keep your hands busy. Day 30: Congratulations! One month without smoking! Give yourself a big reward. Your caregiver can direct you to community resources or hospitals for support, which may include:  Group support.   Education.   Hypnosis.   Subliminal therapy.  Document Released: 03/29/2004 Document Revised: 06/20/2011 Document  Reviewed: 04/17/2009 [facility] Patient Information 2012 [place], [place].

## 2011-11-18 NOTE — Progress Notes (Signed)
  Subjective:    [name] [name] is a 66 y.o. female here for follow up of dyslipidemia. The patient does not use medications that may worsen dyslipidemias (corticosteroids, progestins, anabolic steroids, diuretics, beta-blockers, amiodarone, cyclosporine, olanzapine). The patient exercises occasionally. The patient is not known to have coexisting coronary artery disease.   Cardiac Risk Factors Age > 45-female, > 55-female:  YES  +1  Smoking:   YES  +1  Sig. family hx of CHD*:  NO  Hypertension:   NO  Diabetes:   NO  HDL < 35:   NO  HDL > 59:   YES  -1  Total: 1   *- Sig. family h/o CHD per NCEP = MI or sudden death at <55yo in  father or other 1st-degree female relative, or <65yo in mother or  other 1st-degree female relative  The following portions of the patient's history were reviewed and updated as appropriate: allergies, current medications, past family history, past medical history, past social history, past surgical history and problem list.  Review of Systems Pertinent items are noted in HPI.    Objective:    BP 124/82  Pulse 77  Temp(Src) 98.7 F (37.1 C) (Oral)  Ht 5' 1.75" (1.568 m)  Wt 255 lb 3.2 oz (115.758 kg)  BMI 47.06 kg/m2  SpO2 96% General appearance: alert, cooperative, appears stated age, no distress and morbidly obese Neck: no adenopathy, no carotid bruit, no JVD, supple, symmetrical, trachea midline and thyroid not enlarged, symmetric, no tenderness/mass/nodules Heart: regular rate and rhythm, S1, S2 normal, no murmur, click, rub or gallop Extremities: extremities normal, atraumatic, no cyanosis or edema  Lab Review Lab Results  Component Value Date   CHOL 216* 06/13/2011   CHOL 215* 02/22/2011   CHOL 141 03/20/2010   HDL 75.10 06/13/2011   HDL 65.70 02/22/2011   HDL 30.86 03/20/2010   LDLDIRECT 129.1 06/13/2011   LDLDIRECT 145.9 02/22/2011   LDLDIRECT 164.9 01/12/2008      Assessment:  1.  Dyslipidemia as detailed above with 1 CHD risk factors using  NCEP scheme above.  Target levels for LDL are: < 100 mg/dl (CHD or "CHD risk equivalent" is present)  Explained to the patient the respective contributions of genetics, diet, and exercise to lipid levels and the use of medication in severe cases which do not respond to lifestyle alteration. The patient's interest and motivation in making lifestyle changes seems fair.   2 hypothyroid ---check labs,  con't meds 3.  Hx tob ----nicorette gum,  E cig,  See avs Plan:    The following changes are planned for the next 3 months, at which time the patient will return for repeat fasting lipids:  1. Dietary changes: Increase soluble fiber Plant sterols 2grams per day (e.g. Benecol): yes Reduce saturated fat, "trans" monounsaturated fatty acids, and cholesterol 2. Exercise changes:  inc aerobic exercise 3. Other treatment: Smoking cessation (sample gum given and pt uses e cig) Weight reduction (diet and exercise) 4. Lipid-lowering medications: zocor  (Recommended by NCEP after 3-6 mos of dietary therapy & lifestyle modification,  except if CHD is present or LDL well above 190.) 5. Hormone replacement therapy (patient is a postmenopausal  woman): no 6. Screening for secondary causes of dyslipidemias: None indicated 7. Lipid screening for relatives: na 8. Follow up: 3 months.  Note: The majority of the visit was spent in counseling on the pathophysiology and treatment of dyslipidemias. The total face-to-face time was in excess of 20 minutes.

## 2012-01-23 ENCOUNTER — Other Ambulatory Visit (INDEPENDENT_AMBULATORY_CARE_PROVIDER_SITE_OTHER): Payer: 59

## 2012-01-23 DIAGNOSIS — E785 Hyperlipidemia, unspecified: Secondary | ICD-10-CM

## 2012-01-23 LAB — HEPATIC FUNCTION PANEL
Alkaline Phosphatase: 64 U/L (ref 39–117)
Bilirubin, Direct: 0 mg/dL (ref 0.0–0.3)

## 2012-01-23 LAB — LIPID PANEL
Cholesterol: 167 mg/dL (ref 0–200)
LDL Cholesterol: 88 mg/dL (ref 0–99)
Total CHOL/HDL Ratio: 3

## 2012-01-23 LAB — BASIC METABOLIC PANEL
BUN: 17 mg/dL (ref 6–23)
CO2: 28 mEq/L (ref 19–32)
Chloride: 101 mEq/L (ref 96–112)
Creatinine, Ser: 0.7 mg/dL (ref 0.4–1.2)
Glucose, Bld: 97 mg/dL (ref 70–99)

## 2012-01-28 ENCOUNTER — Ambulatory Visit (INDEPENDENT_AMBULATORY_CARE_PROVIDER_SITE_OTHER): Payer: 59 | Admitting: Family Medicine

## 2012-01-28 ENCOUNTER — Encounter: Payer: Self-pay | Admitting: Family Medicine

## 2012-01-28 VITALS — BP 112/74 | HR 80 | Temp 99.3°F | Wt 256.8 lb

## 2012-01-28 DIAGNOSIS — F411 Generalized anxiety disorder: Secondary | ICD-10-CM

## 2012-01-28 DIAGNOSIS — Z1231 Encounter for screening mammogram for malignant neoplasm of breast: Secondary | ICD-10-CM

## 2012-01-28 DIAGNOSIS — F419 Anxiety disorder, unspecified: Secondary | ICD-10-CM

## 2012-01-28 DIAGNOSIS — E039 Hypothyroidism, unspecified: Secondary | ICD-10-CM

## 2012-01-28 DIAGNOSIS — E785 Hyperlipidemia, unspecified: Secondary | ICD-10-CM

## 2012-01-28 DIAGNOSIS — F17200 Nicotine dependence, unspecified, uncomplicated: Secondary | ICD-10-CM

## 2012-01-28 MED ORDER — SIMVASTATIN 40 MG PO TABS: 40.0000 mg | ORAL_TABLET | Freq: Every evening | ORAL | Status: DC

## 2012-01-28 MED ORDER — ESCITALOPRAM OXALATE 20 MG PO TABS: 20.0000 mg | ORAL_TABLET | Freq: Every day | ORAL | Status: DC

## 2012-01-28 MED ORDER — LEVOTHYROXINE SODIUM 75 MCG PO TABS: 75.0000 ug | ORAL_TABLET | Freq: Every day | ORAL | Status: DC

## 2012-01-28 NOTE — Progress Notes (Signed)
  Subjective:    Patient ID: [name] [name], female    DOB: 1946-02-07, 66 y.o.   MRN: [id number]  HPI Pt here f/u labs and cholesterol.  No complaints.   Review of Systems as above   Objective:   Physical Exam  Constitutional: She is oriented to person, place, and time. She appears well-developed and well-nourished.  Cardiovascular: Normal rate, regular rhythm and normal heart sounds.   No murmur heard. Pulmonary/Chest: Effort normal and breath sounds normal. No respiratory distress. She has no wheezes. She has no rales. She exhibits no tenderness.  Musculoskeletal: Normal range of motion.  Neurological: She is alert and oriented to person, place, and time.  Psychiatric: She has a normal mood and affect. Her behavior is normal. Judgment and thought content normal.          Assessment & Plan:

## 2012-01-28 NOTE — Patient Instructions (Signed)

## 2012-01-28 NOTE — Assessment & Plan Note (Signed)
stable °

## 2012-01-28 NOTE — Assessment & Plan Note (Signed)
Discussed quitting with pt She has E cig at home

## 2012-01-28 NOTE — Assessment & Plan Note (Signed)
con't meds  Check labs 

## 2012-03-26 ENCOUNTER — Ambulatory Visit (HOSPITAL_BASED_OUTPATIENT_CLINIC_OR_DEPARTMENT_OTHER)
Admission: RE | Admit: 2012-03-26 | Discharge: 2012-03-26 | Disposition: A | Discharge status: Home / Self Care | Payer: 59 | Source: Ambulatory Visit | Attending: Family Medicine | Admitting: Family Medicine

## 2012-03-26 DIAGNOSIS — Z1231 Encounter for screening mammogram for malignant neoplasm of breast: Secondary | ICD-10-CM

## 2012-04-15 ENCOUNTER — Ambulatory Visit (INDEPENDENT_AMBULATORY_CARE_PROVIDER_SITE_OTHER): Payer: 59 | Admitting: Obstetrics and Gynecology

## 2012-04-15 ENCOUNTER — Encounter: Payer: Self-pay | Admitting: Obstetrics and Gynecology

## 2012-04-15 VITALS — BP 122/64 | Ht 60.25 in | Wt 256.0 lb

## 2012-04-15 DIAGNOSIS — Z01419 Encounter for gynecological examination (general) (routine) without abnormal findings: Secondary | ICD-10-CM

## 2012-04-15 DIAGNOSIS — N3941 Urge incontinence: Secondary | ICD-10-CM

## 2012-04-15 DIAGNOSIS — N390 Urinary tract infection, site not specified: Secondary | ICD-10-CM

## 2012-04-15 LAB — POCT URINALYSIS DIPSTICK
Bilirubin, UA: NEGATIVE
Glucose, UA: NEGATIVE
Ketones, UA: NEGATIVE
Spec Grav, UA: 1.01
pH, UA: 5

## 2012-04-15 NOTE — Progress Notes (Signed)
AEX  Last Pap: 03/04/11 WNL: Yes Regular Periods:no Contraception: Post-menopause  Monthly Breast exam:no Tetanus<94yrs:yes Nl.Bladder Function:yes Daily BMs:no diarrhea Healthy Diet:yes Calcium:yes Mammogram:yes Date of Mammogram: 04/02/2012 Exercise:no Have often Exercise: n/a Seatbelt: yes Abuse at home: no Stressful work:no Sigmoid-colonoscopy: 2011 Bone Density: Yes 02/2011 PCP: Dr. Laury Axon Change in PMH: None Change in [facility]: None  Subjective:    [name] [name] is a 66 y.o. female [id number] who presents for annual exam.  C/O urgency incontinence.  Only very rare episodes of stress incontinence   The following portions of the patient's history were reviewed and updated as appropriate: allergies, current medications, past family history, past medical history, past social history, past surgical history and problem list.  Review of Systems Pertinent items are noted in HPI. Gastrointestinal:No change in bowel habits, no abdominal pain, no rectal bleeding Genitourinary:negative for dysuria, frequency, hematuria, nocturia.    Objective:     BP 122/64  Ht 5' 0.25" (1.53 m)  Wt 256 lb (116.121 kg)  BMI 49.58 kg/m2  Weight:  Wt Readings from Last 1 Encounters:  04/15/12 256 lb (116.121 kg)     BMI: Body mass index is 49.58 kg/(m^2). General Appearance: Alert, appropriate appearance for age. No acute distress HEENT: Grossly normal Neck / Thyroid: Supple, no masses, nodes or enlargement Lungs: clear to auscultation bilaterally Back: No CVA tenderness Breast Exam: No masses or nodes.No dimpling, nipple retraction or discharge. Cardiovascular: Regular rate and rhythm. S1, S2, no murmur Gastrointestinal: Soft, non-tender, no masses or organomegaly Pelvic Exam: Vulva and vagina appear normal. Bimanual exam reveals normal uterus and adnexa. Exam limited by body habitus Rectovaginal: normal rectal, no masses Lymphatic Exam: Non-palpable nodes in neck, clavicular, axillary, or  inguinal regions Skin: no rash or abnormalities Neurologic: Normal gait and speech, no tremor  Psychiatric: Alert and oriented, appropriate affect.    Urinalysis:Neg.  C&S pending    Assessment:    Hypoestrogenic urgency incontinence    Plan:   mammogram pap smear due 2015 return annually or prn Offered meds but declined.  Bladder training described

## 2012-04-17 LAB — URINE CULTURE: Organism ID, Bacteria: NO GROWTH

## 2012-07-22 ENCOUNTER — Encounter: Payer: Self-pay | Admitting: Lab

## 2012-07-23 ENCOUNTER — Ambulatory Visit (INDEPENDENT_AMBULATORY_CARE_PROVIDER_SITE_OTHER): Payer: 59 | Admitting: Family Medicine

## 2012-07-23 ENCOUNTER — Encounter: Payer: Self-pay | Admitting: Family Medicine

## 2012-07-23 VITALS — BP 116/72 | HR 79 | Temp 98.6°F | Wt 260.0 lb

## 2012-07-23 DIAGNOSIS — R002 Palpitations: Secondary | ICD-10-CM

## 2012-07-23 DIAGNOSIS — F17200 Nicotine dependence, unspecified, uncomplicated: Secondary | ICD-10-CM

## 2012-07-23 MED ORDER — VARENICLINE TARTRATE 0.5 MG X 11 & 1 MG X 42 PO MISC: ORAL | Status: DC

## 2012-07-23 NOTE — Assessment & Plan Note (Signed)
ekg normal Pt requesting to see cardiology Check labs

## 2012-07-23 NOTE — Patient Instructions (Signed)
Smoking Cessation Quitting smoking is important to your health and has many advantages. However, it is not always easy to quit since nicotine is a very addictive drug. Often times, people try 3 times or more before being able to quit. This document explains the best ways for you to prepare to quit smoking. Quitting takes hard work and a lot of effort, but you can do it. ADVANTAGES OF QUITTING SMOKING  You will live longer, feel better, and live better.  Your body will feel the impact of quitting smoking almost immediately.  Within 20 minutes, blood pressure decreases. Your pulse returns to its normal level.  After 8 hours, carbon monoxide levels in the blood return to normal. Your oxygen level increases.  After 24 hours, the chance of having a heart attack starts to decrease. Your breath, hair, and body stop smelling like smoke.  After 48 hours, damaged nerve endings begin to recover. Your sense of taste and smell improve.  After 72 hours, the body is virtually free of nicotine. Your bronchial tubes relax and breathing becomes easier.  After 2 to 12 weeks, lungs can hold more air. Exercise becomes easier and circulation improves.  The risk of having a heart attack, stroke, cancer, or lung disease is greatly reduced.  After 1 year, the risk of coronary heart disease is cut in half.  After 5 years, the risk of stroke falls to the same as a nonsmoker.  After 10 years, the risk of lung cancer is cut in half and the risk of other cancers decreases significantly.  After 15 years, the risk of coronary heart disease drops, usually to the level of a nonsmoker.  If you are pregnant, quitting smoking will improve your chances of having a healthy baby.  The people you live with, especially any children, will be healthier.  You will have extra money to spend on things other than cigarettes. QUESTIONS TO THINK ABOUT BEFORE ATTEMPTING TO QUIT You may want to talk about your answers with your  caregiver.  Why do you want to quit?  If you tried to quit in the past, what helped and what did not?  What will be the most difficult situations for you after you quit? How will you plan to handle them?  Who can help you through the tough times? Your family? Friends? A caregiver?  What pleasures do you get from smoking? What ways can you still get pleasure if you quit? Here are some questions to ask your caregiver:  How can you help me to be successful at quitting?  What medicine do you think would be best for me and how should I take it?  What should I do if I need more help?  What is smoking withdrawal like? How can I get information on withdrawal? GET READY  Set a quit date.  Change your environment by getting rid of all cigarettes, ashtrays, matches, and lighters in your home, car, or work. Do not let people smoke in your home.  Review your past attempts to quit. Think about what worked and what did not. GET SUPPORT AND ENCOURAGEMENT You have a better chance of being successful if you have help. You can get support in many ways.  Tell your family, friends, and co-workers that you are going to quit and need their support. Ask them not to smoke around you.  Get individual, group, or telephone counseling and support. Programs are available at local hospitals and health centers. Call your local health department for   information about programs in your area.  Spiritual beliefs and practices may help some smokers quit.  Download a "quit meter" on your computer to keep track of quit statistics, such as how long you have gone without smoking, cigarettes not smoked, and money saved.  Get a self-help book about quitting smoking and staying off of tobacco. LEARN NEW SKILLS AND BEHAVIORS  Distract yourself from urges to smoke. Talk to someone, go for a walk, or occupy your time with a task.  Change your normal routine. Take a different route to work. Drink tea instead of coffee.  Eat breakfast in a different place.  Reduce your stress. Take a hot bath, exercise, or read a book.  Plan something enjoyable to do every day. Reward yourself for not smoking.  Explore interactive web-based programs that specialize in helping you quit. GET MEDICINE AND USE IT CORRECTLY Medicines can help you stop smoking and decrease the urge to smoke. Combining medicine with the above behavioral methods and support can greatly increase your chances of successfully quitting smoking.  Nicotine replacement therapy helps deliver nicotine to your body without the negative effects and risks of smoking. Nicotine replacement therapy includes nicotine gum, lozenges, inhalers, nasal sprays, and skin patches. Some may be available over-the-counter and others require a prescription.  Antidepressant medicine helps people abstain from smoking, but how this works is unknown. This medicine is available by prescription.  Nicotinic receptor partial agonist medicine simulates the effect of nicotine in your brain. This medicine is available by prescription. Ask your caregiver for advice about which medicines to use and how to use them based on your health history. Your caregiver will tell you what side effects to look out for if you choose to be on a medicine or therapy. Carefully read the information on the package. Do not use any other product containing nicotine while using a nicotine replacement product.  RELAPSE OR DIFFICULT SITUATIONS Most relapses occur within the first 3 months after quitting. Do not be discouraged if you start smoking again. Remember, most people try several times before finally quitting. You may have symptoms of withdrawal because your body is used to nicotine. You may crave cigarettes, be irritable, feel very hungry, cough often, get headaches, or have difficulty concentrating. The withdrawal symptoms are only temporary. They are strongest when you first quit, but they will go away within  10 14 days. To reduce the chances of relapse, try to:  Avoid drinking alcohol. Drinking lowers your chances of successfully quitting.  Reduce the amount of caffeine you consume. Once you quit smoking, the amount of caffeine in your body increases and can give you symptoms, such as a rapid heartbeat, sweating, and anxiety.  Avoid smokers because they can make you want to smoke.  Do not let weight gain distract you. Many smokers will gain weight when they quit, usually less than 10 pounds. Eat a healthy diet and stay active. You can always lose the weight gained after you quit.  Find ways to improve your mood other than smoking. FOR MORE INFORMATION  www.smokefree.gov  Document Released: 06/25/2001 Document Revised: 12/31/2011 Document Reviewed: 10/10/2011 [employer] Patient Information 2013 [employer].  

## 2012-07-23 NOTE — Progress Notes (Signed)
  Subjective:    Patient ID: [name] [name], female    DOB: 03-24-1946, 67 y.o.   MRN: [id number]  HPI Pt here to discuss quitting smoking and is curious about getting a CT of the chest. She saw it on TV that after 30 yrs of smoking you should get a CT.    Review of Systems    as above Objective:   Physical Exam BP 116/72  Pulse 79  Temp 98.6 F (37 C) (Oral)  Wt 260 lb (117.935 kg)  SpO2 97% General appearance: alert, cooperative, appears stated age and no distress Neck: no adenopathy, no carotid bruit, no JVD, supple, symmetrical, trachea midline and thyroid not enlarged, symmetric, no tenderness/mass/nodules Lungs: clear to auscultation bilaterally Heart: S1, S2 normal       Assessment & Plan:

## 2012-07-23 NOTE — Assessment & Plan Note (Signed)
Pt to start chantix See AVS

## 2012-07-24 LAB — HEPATIC FUNCTION PANEL
Albumin: 4.4 g/dL (ref 3.5–5.2)
Alkaline Phosphatase: 74 U/L (ref 39–117)
Total Protein: 7.2 g/dL (ref 6.0–8.3)

## 2012-07-24 LAB — CBC WITH DIFFERENTIAL/PLATELET
Basophils Relative: 0.5 % (ref 0.0–3.0)
Eosinophils Absolute: 0.1 10*3/uL (ref 0.0–0.7)
HCT: 45.8 % (ref 36.0–46.0)
Hemoglobin: 15.4 g/dL — ABNORMAL HIGH (ref 12.0–15.0)
Lymphocytes Relative: 21.6 % (ref 12.0–46.0)
Lymphs Abs: 1.3 10*3/uL (ref 0.7–4.0)
MCHC: 33.7 g/dL (ref 30.0–36.0)
MCV: 91.3 fl (ref 78.0–100.0)
Neutro Abs: 4 10*3/uL (ref 1.4–7.7)
RBC: 5.02 Mil/uL (ref 3.87–5.11)

## 2012-07-24 LAB — BASIC METABOLIC PANEL
CO2: 29 mEq/L (ref 19–32)
Calcium: 9.1 mg/dL (ref 8.4–10.5)
Chloride: 102 mEq/L (ref 96–112)
Glucose, Bld: 91 mg/dL (ref 70–99)
Potassium: 4 mEq/L (ref 3.5–5.1)
Sodium: 138 mEq/L (ref 135–145)

## 2012-07-24 LAB — TSH: TSH: 3.03 u[IU]/mL (ref 0.35–5.50)

## 2012-08-04 ENCOUNTER — Telehealth: Payer: Self-pay | Admitting: *Deleted

## 2012-08-04 NOTE — Telephone Encounter (Signed)
Spoke with pharmacy no PA available under Pt plan. Pt will need to pay out of pocket for med. Pt made aware via phone call.

## 2012-08-20 ENCOUNTER — Encounter: Payer: Self-pay | Admitting: Family Medicine

## 2012-08-22 ENCOUNTER — Encounter: Payer: Self-pay | Admitting: Family Medicine

## 2012-08-25 ENCOUNTER — Encounter: Payer: Self-pay | Admitting: Family Medicine

## 2012-08-25 MED ORDER — VARENICLINE TARTRATE 1 MG PO TABS: 1.0000 mg | ORAL_TABLET | Freq: Two times a day (BID) | ORAL | Status: DC

## 2012-12-14 ENCOUNTER — Other Ambulatory Visit (INDEPENDENT_AMBULATORY_CARE_PROVIDER_SITE_OTHER): Payer: 59

## 2012-12-14 DIAGNOSIS — E785 Hyperlipidemia, unspecified: Secondary | ICD-10-CM

## 2012-12-15 LAB — HEPATIC FUNCTION PANEL
ALT: 17 U/L (ref 0–35)
AST: 21 U/L (ref 0–37)
Albumin: 3.8 g/dL (ref 3.5–5.2)
Alkaline Phosphatase: 60 U/L (ref 39–117)
Total Bilirubin: 0.4 mg/dL (ref 0.3–1.2)

## 2012-12-15 LAB — LIPID PANEL
Total CHOL/HDL Ratio: 3
Triglycerides: 103 mg/dL (ref 0.0–149.0)

## 2012-12-17 ENCOUNTER — Ambulatory Visit (INDEPENDENT_AMBULATORY_CARE_PROVIDER_SITE_OTHER): Payer: 59 | Admitting: Family Medicine

## 2012-12-17 ENCOUNTER — Encounter: Payer: Self-pay | Admitting: Family Medicine

## 2012-12-17 VITALS — BP 120/76 | HR 70 | Temp 98.6°F | Wt 254.0 lb

## 2012-12-17 DIAGNOSIS — F419 Anxiety disorder, unspecified: Secondary | ICD-10-CM

## 2012-12-17 DIAGNOSIS — IMO0002 Reserved for concepts with insufficient information to code with codable children: Secondary | ICD-10-CM

## 2012-12-17 DIAGNOSIS — E785 Hyperlipidemia, unspecified: Secondary | ICD-10-CM

## 2012-12-17 DIAGNOSIS — F411 Generalized anxiety disorder: Secondary | ICD-10-CM

## 2012-12-17 DIAGNOSIS — E039 Hypothyroidism, unspecified: Secondary | ICD-10-CM

## 2012-12-17 DIAGNOSIS — W57XXXA Bitten or stung by nonvenomous insect and other nonvenomous arthropods, initial encounter: Secondary | ICD-10-CM

## 2012-12-17 MED ORDER — SIMVASTATIN 40 MG PO TABS: 40.0000 mg | ORAL_TABLET | Freq: Every evening | ORAL | Status: DC

## 2012-12-17 MED ORDER — LEVOTHYROXINE SODIUM 75 MCG PO TABS: 75.0000 ug | ORAL_TABLET | Freq: Every day | ORAL | Status: DC

## 2012-12-17 MED ORDER — ESCITALOPRAM OXALATE 20 MG PO TABS: 20.0000 mg | ORAL_TABLET | Freq: Every day | ORAL | Status: DC

## 2012-12-17 NOTE — Progress Notes (Signed)
  Subjective:    MATTISEN POHLMANN is a 67 y.o. female here for follow up of dyslipidemia. The patient does not use medications that may worsen dyslipidemias (corticosteroids, progestins, anabolic steroids, diuretics, beta-blockers, amiodarone, cyclosporine, olanzapine). The patient exercises occasionally. The patient is not known to have coexisting coronary artery disease.  Cardiac Risk Factors Age > 45-female, > 55-female:  YES  +1  Smoking:   yew  Sig. family hx of CHD*:  NO  Hypertension:   NO  Diabetes:   NO  HDL < 35:   NO  HDL > 59:   NO  Total: 2   *- Sig. family h/o CHD per NCEP = MI or sudden death at <55yo in  father or other 1st-degree female relative, or <65yo in mother or  other 1st-degree female relative  The pt also c/o sore spot on 3rd R toe--she may have been bit by something.  The following portions of the patient's history were reviewed and updated as appropriate: allergies, current medications, past family history, past medical history, past social history, past surgical history and problem list.  Review of Systems Pertinent items are noted in HPI.    Objective:    BP 120/76  Pulse 70  Temp(Src) 98.6 F (37 C) (Oral)  Wt 254 lb (115.214 kg)  BMI 49.22 kg/m2  SpO2 95% General appearance: alert, cooperative, appears stated age and no distress Neck: no adenopathy, no carotid bruit, no JVD, supple, symmetrical, trachea midline and thyroid not enlarged, symmetric, no tenderness/mass/nodules Lungs: clear to auscultation bilaterally Heart: regular rate and rhythm, S1, S2 normal, no murmur, click, rub or gallop Extremities: extremities normal, atraumatic, no cyanosis or edema Skin: + papule on 3rd toe, itchy  Lab Review Lab Results  Component Value Date   CHOL 157 12/15/2012   CHOL 167 01/23/2012   CHOL 165 11/18/2011   HDL 56.80 12/15/2012   HDL 65.70 01/23/2012   HDL 91.47 11/18/2011   LDLDIRECT 129.1 06/13/2011   LDLDIRECT 145.9 02/22/2011   LDLDIRECT 164.9  01/12/2008      Assessment:    Dyslipidemia as detailed above with 1 CHD risk factors using NCEP scheme above.  Target levels for LDL are: < 100 mg/dl (CHD or "CHD risk equivalent" is present)  Explained to the patient the respective contributions of genetics, diet, and exercise to lipid levels and the use of medication in severe cases which do not respond to lifestyle alteration. The patient's interest and motivation in making lifestyle changes seems good.    Plan:    The following changes are planned for the next 6 months, at which time the patient will return for repeat fasting lipids:  1. Dietary changes: Increase soluble fiber Plant sterols 2grams per day (e.g. Benecol): yes Reduce saturated fat, "trans" monounsaturated fatty acids, and cholesterol 2. Exercise changes:  inc exercise 3. Other treatment: Weight reduction (-) 4. Lipid-lowering medications: con't zocor  (Recommended by NCEP after 3-6 mos of dietary therapy & lifestyle modification,  except if CHD is present or LDL well above 190.) 5. Hormone replacement therapy (patient is a postmenopausal  woman): no 6. Screening for secondary causes of dyslipidemias: None indicated 7. Lipid screening for relatives: na 8. Follow up: 6 months.  Marland Kitchen

## 2012-12-17 NOTE — Patient Instructions (Addendum)

## 2012-12-17 NOTE — Assessment & Plan Note (Signed)
otc hydrocortisone

## 2013-02-01 ENCOUNTER — Ambulatory Visit (INDEPENDENT_AMBULATORY_CARE_PROVIDER_SITE_OTHER): Payer: 59 | Admitting: Family Medicine

## 2013-02-01 ENCOUNTER — Encounter: Payer: Self-pay | Admitting: Family Medicine

## 2013-02-01 VITALS — BP 116/78 | HR 74 | Temp 98.6°F | Ht 61.0 in | Wt 258.8 lb

## 2013-02-01 DIAGNOSIS — Z23 Encounter for immunization: Secondary | ICD-10-CM

## 2013-02-01 DIAGNOSIS — E785 Hyperlipidemia, unspecified: Secondary | ICD-10-CM

## 2013-02-01 DIAGNOSIS — D492 Neoplasm of unspecified behavior of bone, soft tissue, and skin: Secondary | ICD-10-CM

## 2013-02-01 DIAGNOSIS — D229 Melanocytic nevi, unspecified: Secondary | ICD-10-CM | POA: Insufficient documentation

## 2013-02-01 DIAGNOSIS — E039 Hypothyroidism, unspecified: Secondary | ICD-10-CM

## 2013-02-01 DIAGNOSIS — Z0000 Encounter for general adult medical examination without abnormal findings: Secondary | ICD-10-CM

## 2013-02-01 NOTE — Assessment & Plan Note (Signed)
Check labs con't meds 

## 2013-02-01 NOTE — Patient Instructions (Signed)

## 2013-02-01 NOTE — Assessment & Plan Note (Signed)
Check labs 

## 2013-02-01 NOTE — Assessment & Plan Note (Signed)
Pt to make appointment to see her derm Suspect SCC

## 2013-02-01 NOTE — Progress Notes (Signed)
Subjective:    Patient ID: [name] [name], female    DOB: February 20, 1946, 67 y.o.   MRN: [id number]  HPI Pt here to get paperwork filled out for work / ins.  She also wants pneumovax Pt would also like me to look at mole on chest.  No other complaints.      Review of Systems As above Past Medical History  Diagnosis Date  . Depression   . Allergic rhinitis   . Hyperlipidemia   . Hyperlipidemia     on simvastatin  . Seasonal allergies   . Arthritis   . Hypothyroidism   . Endometrial polyp   . Obesity    Family History  Problem Relation Age of Onset  . Colon cancer Father 44  . Cancer Father 58    colon  . Pancreatic cancer Mother 64  . Cancer Mother 70    pancreatic  . Colon cancer Paternal Grandmother   . Cancer Paternal Grandmother 37    colon  . Heart attack Maternal Grandmother 50  . Heart disease Maternal Grandmother   . Breast cancer Maternal Aunt 30  . Heart attack Maternal Aunt 60  . Cancer Sister 21    melanoma  . Thyroid disease Sister   . Cancer Brother 58    skin  . Thyroid disease Sister    History   Social History  . Marital Status: Married    Spouse Name: N/A    Number of Children: N/A  . Years of Education: N/A   Occupational History  . Daycare p/t    Social History Main Topics  . Smoking status: Former Smoker -- 1.00 packs/day    Types: Cigarettes    Quit date: 07/28/2012  . Smokeless tobacco: Never Used  . Alcohol Use: 0.0 oz/week     Comment: rare  . Drug Use: No  . Sexually Active: Yes -- Female partner(s)    Birth Control/ Protection: Post-menopausal   Other Topics Concern  . Not on file   Social History Narrative   Regular exercise- no    Current Outpatient Prescriptions on File Prior to Visit  Medication Sig Dispense Refill  . ALPRAZolam (XANAX) 0.25 MG tablet Take 1 tablet (0.25 mg total) by mouth 3 (three) times daily as needed for anxiety.  30 tablet  0  . ASTAXANTHIN PO Take 1 tablet by mouth daily.        Marland Kitchen  escitalopram (LEXAPRO) 20 MG tablet Take 1 tablet (20 mg total) by mouth daily.  90 tablet  3  . fexofenadine (ALLEGRA) 180 MG tablet Take 180 mg by mouth daily.        Marland Kitchen levothyroxine (SYNTHROID, LEVOTHROID) 75 MCG tablet Take 1 tablet (75 mcg total) by mouth daily.  90 tablet  3  . multivitamin (THERAGRAN) per tablet Take 1 tablet by mouth daily.        . simvastatin (ZOCOR) 40 MG tablet Take 1 tablet (40 mg total) by mouth every evening.  90 tablet  3  . Ubiquinol 100 MG CAPS Take by mouth.       No current facility-administered medications on file prior to visit.       Objective:   Physical Exam  BP 116/78  Pulse 74  Temp(Src) 98.6 F (37 C) (Oral)  Ht 5\' 1"  (1.549 m)  Wt 258 lb 12.8 oz (117.391 kg)  BMI 48.93 kg/m2  SpO2 96% General appearance: alert, cooperative, appears stated age and no distress Neck: no adenopathy, no carotid  bruit, no JVD, supple, symmetrical, trachea midline and thyroid not enlarged, symmetric, no tenderness/mass/nodules Lungs: clear to auscultation bilaterally Heart: S1, S2 normal Extremities: extremities normal, atraumatic, no cyanosis or edema Skin: suspicious mole chest--  ? SCC       Assessment & Plan:

## 2013-02-02 LAB — POCT URINALYSIS DIPSTICK
Ketones, UA: NEGATIVE
Leukocytes, UA: NEGATIVE
Protein, UA: NEGATIVE
pH, UA: 6.5

## 2013-02-02 LAB — CBC WITH DIFFERENTIAL/PLATELET
Basophils Absolute: 0.1 10*3/uL (ref 0.0–0.1)
Eosinophils Absolute: 0.1 10*3/uL (ref 0.0–0.7)
Lymphocytes Relative: 24.4 % (ref 12.0–46.0)
MCHC: 33.7 g/dL (ref 30.0–36.0)
Neutrophils Relative %: 64.4 % (ref 43.0–77.0)
Platelets: 156 10*3/uL (ref 150.0–400.0)
RDW: 13.4 % (ref 11.5–14.6)

## 2013-02-02 LAB — HEPATIC FUNCTION PANEL
Albumin: 3.8 g/dL (ref 3.5–5.2)
Alkaline Phosphatase: 65 U/L (ref 39–117)
Bilirubin, Direct: 0 mg/dL (ref 0.0–0.3)
Total Bilirubin: 0.5 mg/dL (ref 0.3–1.2)

## 2013-02-02 LAB — LIPID PANEL
HDL: 59.7 mg/dL (ref >39.00)
Total CHOL/HDL Ratio: 2
VLDL: 15.2 mg/dL (ref 0.0–40.0)

## 2013-02-02 LAB — BASIC METABOLIC PANEL
CO2: 28 mEq/L (ref 19–32)
Calcium: 8.8 mg/dL (ref 8.4–10.5)
Creatinine, Ser: 0.7 mg/dL (ref 0.4–1.2)
Glucose, Bld: 97 mg/dL (ref 70–99)

## 2013-02-02 LAB — TSH: TSH: 3.22 u[IU]/mL (ref 0.35–5.50)

## 2013-02-04 ENCOUNTER — Ambulatory Visit: Payer: 59

## 2013-02-04 DIAGNOSIS — L0390 Cellulitis, unspecified: Secondary | ICD-10-CM

## 2013-02-04 MED ORDER — CEPHALEXIN 500 MG PO CAPS: 500.0000 mg | ORAL_CAPSULE | Freq: Two times a day (BID) | ORAL | Status: DC

## 2013-02-04 NOTE — Patient Instructions (Signed)
Patient came into the office with a cellulitis reaction to the site of the pneumovax (left deltoid) which is warm to the touch. Reviewed with Dr. Beverely Low and it is ok to call in Keflex 500 mg 1 by mouth twice daily for 7 days. Rx has been faxed and patient has been made aware to follow up.      KP

## 2013-02-16 ENCOUNTER — Other Ambulatory Visit: Payer: Self-pay | Admitting: Dermatology

## 2013-03-10 ENCOUNTER — Other Ambulatory Visit: Payer: Self-pay | Admitting: Obstetrics and Gynecology

## 2013-03-10 DIAGNOSIS — Z1231 Encounter for screening mammogram for malignant neoplasm of breast: Secondary | ICD-10-CM

## 2013-04-07 ENCOUNTER — Ambulatory Visit (HOSPITAL_BASED_OUTPATIENT_CLINIC_OR_DEPARTMENT_OTHER)
Admission: RE | Admit: 2013-04-07 | Discharge: 2013-04-07 | Disposition: A | Discharge status: Home / Self Care | Payer: 59 | Source: Ambulatory Visit | Attending: Obstetrics and Gynecology | Admitting: Obstetrics and Gynecology

## 2013-04-07 DIAGNOSIS — Z1231 Encounter for screening mammogram for malignant neoplasm of breast: Secondary | ICD-10-CM | POA: Insufficient documentation

## 2013-05-20 ENCOUNTER — Other Ambulatory Visit: Payer: Self-pay

## 2013-07-16 ENCOUNTER — Other Ambulatory Visit (INDEPENDENT_AMBULATORY_CARE_PROVIDER_SITE_OTHER): Payer: 59

## 2013-07-16 DIAGNOSIS — E785 Hyperlipidemia, unspecified: Secondary | ICD-10-CM

## 2013-07-16 LAB — BASIC METABOLIC PANEL
BUN: 12 mg/dL (ref 6–23)
CO2: 25 meq/L (ref 19–32)
Calcium: 9.2 mg/dL (ref 8.4–10.5)
Chloride: 102 mEq/L (ref 96–112)
Creatinine, Ser: 0.7 mg/dL (ref 0.4–1.2)
GFR: 93.85 mL/min (ref >60.00)
GLUCOSE: 120 mg/dL — AB (ref 70–99)
POTASSIUM: 3.9 meq/L (ref 3.5–5.1)
SODIUM: 136 meq/L (ref 135–145)

## 2013-07-16 LAB — HEPATIC FUNCTION PANEL
ALT: 20 U/L (ref 0–35)
AST: 28 U/L (ref 0–37)
Albumin: 4.6 g/dL (ref 3.5–5.2)
Alkaline Phosphatase: 76 U/L (ref 39–117)
BILIRUBIN DIRECT: 0.1 mg/dL (ref 0.0–0.3)
BILIRUBIN TOTAL: 0.7 mg/dL (ref 0.3–1.2)
Total Protein: 7.4 g/dL (ref 6.0–8.3)

## 2013-07-16 LAB — LIPID PANEL
CHOL/HDL RATIO: 2
Cholesterol: 165 mg/dL (ref 0–200)
HDL: 68.7 mg/dL (ref >39.00)
LDL Cholesterol: 74 mg/dL (ref 0–99)
Triglycerides: 110 mg/dL (ref 0.0–149.0)
VLDL: 22 mg/dL (ref 0.0–40.0)

## 2013-07-22 ENCOUNTER — Telehealth: Payer: Self-pay

## 2013-07-22 NOTE — Telephone Encounter (Signed)
Medication and allergies:  Reviewed and updated  90 day supply/mail order: na Local pharmacy: [facility] [address]   Immunizations due: UTD   A/P:   No changes to FH or PSH or personal hx MMG--03/2013--neg Bone Density--none on file CCS--02/2010--benign polyp--next due 2016 Flu vaccine--05/2013 PNA--01/2013 Tdap--01/2010 Shingles--01/2010  To Discuss with Provider: Second PNA vaccine The first one does not show on health maintenance because it was before her birthday

## 2013-07-23 ENCOUNTER — Encounter: Payer: Self-pay | Admitting: Family Medicine

## 2013-07-23 ENCOUNTER — Ambulatory Visit: Payer: 59 | Admitting: Family Medicine

## 2013-07-23 VITALS — BP 118/80 | HR 79 | Temp 98.6°F | Ht 60.5 in | Wt 260.0 lb

## 2013-07-23 DIAGNOSIS — E039 Hypothyroidism, unspecified: Secondary | ICD-10-CM

## 2013-07-23 DIAGNOSIS — F3289 Other specified depressive episodes: Secondary | ICD-10-CM

## 2013-07-23 DIAGNOSIS — Z23 Encounter for immunization: Secondary | ICD-10-CM

## 2013-07-23 DIAGNOSIS — E669 Obesity, unspecified: Secondary | ICD-10-CM | POA: Insufficient documentation

## 2013-07-23 DIAGNOSIS — F419 Anxiety disorder, unspecified: Secondary | ICD-10-CM

## 2013-07-23 DIAGNOSIS — F329 Major depressive disorder, single episode, unspecified: Secondary | ICD-10-CM

## 2013-07-23 DIAGNOSIS — E785 Hyperlipidemia, unspecified: Secondary | ICD-10-CM

## 2013-07-23 MED ORDER — ESCITALOPRAM OXALATE 20 MG PO TABS: 20.0000 mg | ORAL_TABLET | Freq: Every day | ORAL | Status: DC

## 2013-07-23 MED ORDER — SIMVASTATIN 40 MG PO TABS: 40.0000 mg | ORAL_TABLET | Freq: Every evening | ORAL | Status: DC

## 2013-07-23 MED ORDER — LEVOTHYROXINE SODIUM 75 MCG PO TABS: 75.0000 ug | ORAL_TABLET | Freq: Every day | ORAL | Status: DC

## 2013-07-23 MED ORDER — ALPRAZOLAM 0.25 MG PO TABS: 0.2500 mg | ORAL_TABLET | Freq: Three times a day (TID) | ORAL | Status: DC | PRN

## 2013-07-23 NOTE — Progress Notes (Signed)
Pre visit review using our clinic review tool, if applicable. No additional management support is needed unless otherwise documented below in the visit note. 

## 2013-07-23 NOTE — Assessment & Plan Note (Signed)
Check labs 

## 2013-07-23 NOTE — Assessment & Plan Note (Signed)
Stable con't meds 

## 2013-07-23 NOTE — Patient Instructions (Signed)
Preventive Care for Adults, Female A healthy lifestyle and preventive care can promote health and wellness. Preventive health guidelines for women include the following key practices.  A routine yearly physical is a good way to check with your caregiver about your health and preventive screening. It is a chance to share any concerns and updates on your health, and to receive a thorough exam.  Visit your dentist for a routine exam and preventive care every 6 months. Brush your teeth twice a day and floss once a day. Good oral hygiene prevents tooth decay and gum disease.  The frequency of eye exams is based on your age, health, family medical history, use of contact lenses, and other factors. Follow your caregiver's recommendations for frequency of eye exams.  Eat a healthy diet. Foods like vegetables, fruits, whole grains, low-fat dairy products, and lean protein foods contain the nutrients you need without too many calories. Decrease your intake of foods high in solid fats, added sugars, and salt. Eat the right amount of calories for you.Get information about a proper diet from your caregiver, if necessary.  Regular physical exercise is one of the most important things you can do for your health. Most adults should get at least 150 minutes of moderate-intensity exercise (any activity that increases your heart rate and causes you to sweat) each week. In addition, most adults need muscle-strengthening exercises on 2 or more days a week.  Maintain a healthy weight. The body mass index (BMI) is a screening tool to identify possible weight problems. It provides an estimate of body fat based on height and weight. Your caregiver can help determine your BMI, and can help you achieve or maintain a healthy weight.For adults 20 years and older:  A BMI below 18.5 is considered underweight.  A BMI of 18.5 to 24.9 is normal.  A BMI of 25 to 29.9 is considered overweight.  A BMI of 30 and above is  considered obese.  Maintain normal blood lipids and cholesterol levels by exercising and minimizing your intake of saturated fat. Eat a balanced diet with plenty of fruit and vegetables. Blood tests for lipids and cholesterol should begin at age 20 and be repeated every 5 years. If your lipid or cholesterol levels are high, you are over 50, or you are at high risk for heart disease, you may need your cholesterol levels checked more frequently.Ongoing high lipid and cholesterol levels should be treated with medicines if diet and exercise are not effective.  If you smoke, find out from your caregiver how to quit. If you do not use tobacco, do not start.  Lung cancer screening is recommended for adults aged 55 80 years who are at high risk for developing lung cancer because of a history of smoking. Yearly low-dose computed tomography (CT) is recommended for people who have at least a 30-pack-year history of smoking and are a current smoker or have quit within the past 15 years. A pack year of smoking is smoking an average of 1 pack of cigarettes a day for 1 year (for example: 1 pack a day for 30 years or 2 packs a day for 15 years). Yearly screening should continue until the smoker has stopped smoking for at least 15 years. Yearly screening should also be stopped for people who develop a health problem that would prevent them from having lung cancer treatment.  If you are pregnant, do not drink alcohol. If you are breastfeeding, be very cautious about drinking alcohol. If you are   not pregnant and choose to drink alcohol, do not exceed 1 drink per day. One drink is considered to be 12 ounces (355 mL) of beer, 5 ounces (148 mL) of wine, or 1.5 ounces (44 mL) of liquor.  Avoid use of street drugs. Do not share needles with anyone. Ask for help if you need support or instructions about stopping the use of drugs.  High blood pressure causes heart disease and increases the risk of stroke. Your blood pressure  should be checked at least every 1 to 2 years. Ongoing high blood pressure should be treated with medicines if weight loss and exercise are not effective.  If you are 61 to 66 years old, ask your caregiver if you should take aspirin to prevent strokes.  Diabetes screening involves taking a blood sample to check your fasting blood sugar level. This should be done once every 3 years, after age 73, if you are within normal weight and without risk factors for diabetes. Testing should be considered at a younger age or be carried out more frequently if you are overweight and have at least 1 risk factor for diabetes.  Breast cancer screening is essential preventive care for women. You should practice "breast self-awareness." This means understanding the normal appearance and feel of your breasts and may include breast self-examination. Any changes detected, no matter how small, should be reported to a caregiver. Women in their 75s and 30s should have a clinical breast exam (CBE) by a caregiver as part of a regular health exam every 1 to 3 years. After age 53, women should have a CBE every year. Starting at age 60, women should consider having a mammography (breast X-ray test) every year. Women who have a family history of breast cancer should talk to their caregiver about genetic screening. Women at a high risk of breast cancer should talk to their caregivers about having magnetic resonance imaging (MRI) and a mammography every year.  Breast cancer gene (BRCA)-related cancer risk assessment is recommended for women who have family members with BRCA-related cancers. BRCA-related cancers include breast, ovarian, tubal, and peritoneal cancers. Having family members with these cancers may be associated with an increased risk for harmful changes (mutations) in the breast cancer genes BRCA1 and BRCA2. Results of the assessment will determine the need for genetic counseling and BRCA1 and BRCA2 testing.  The Pap test is  a screening test for cervical cancer. A Pap test can show cell changes on the cervix that might become cervical cancer if left untreated. A Pap test is a procedure in which cells are obtained and examined from the lower end of the uterus (cervix).  Women should have a Pap test starting at age 85.  Between ages 23 and 51, Pap tests should be repeated every 2 years.  Beginning at age 42, you should have a Pap test every 3 years as long as the past 3 Pap tests have been normal.  Some women have medical problems that increase the chance of getting cervical cancer. Talk to your caregiver about these problems. It is especially important to talk to your caregiver if a new problem develops soon after your last Pap test. In these cases, your caregiver may recommend more frequent screening and Pap tests.  The above recommendations are the same for women who have or have not gotten the vaccine for human papillomavirus (HPV).  If you had a hysterectomy for a problem that was not cancer or a condition that could lead to cancer, then  you no longer need Pap tests. Even if you no longer need a Pap test, a regular exam is a good idea to make sure no other problems are starting.  If you are between ages 79 and 19, and you have had normal Pap tests going back 10 years, you no longer need Pap tests. Even if you no longer need a Pap test, a regular exam is a good idea to make sure no other problems are starting.  If you have had past treatment for cervical cancer or a condition that could lead to cancer, you need Pap tests and screening for cancer for at least 20 years after your treatment.  If Pap tests have been discontinued, risk factors (such as a new sexual partner) need to be reassessed to determine if screening should be resumed.  The HPV test is an additional test that may be used for cervical cancer screening. The HPV test looks for the virus that can cause the cell changes on the cervix. The cells collected  during the Pap test can be tested for HPV. The HPV test could be used to screen women aged 90 years and older, and should be used in women of any age who have unclear Pap test results. After the age of 9, women should have HPV testing at the same frequency as a Pap test.  Colorectal cancer can be detected and often prevented. Most routine colorectal cancer screening begins at the age of 63 and continues through age 61. However, your caregiver may recommend screening at an earlier age if you have risk factors for colon cancer. On a yearly basis, your caregiver may provide home test kits to check for hidden blood in the stool. Use of a small camera at the end of a tube, to directly examine the colon (sigmoidoscopy or colonoscopy), can detect the earliest forms of colorectal cancer. Talk to your caregiver about this at age 32, when routine screening begins. Direct examination of the colon should be repeated every 5 to 10 years through age 7, unless early forms of pre-cancerous polyps or small growths are found.  Hepatitis C blood testing is recommended for all people born from [birth year] through 1965 and any individual with known risks for hepatitis C.  Practice safe sex. Use condoms and avoid high-risk sexual practices to reduce the spread of sexually transmitted infections (STIs). STIs include gonorrhea, chlamydia, syphilis, trichomonas, herpes, HPV, and human immunodeficiency virus (HIV). Herpes, HIV, and HPV are viral illnesses that have no cure. They can result in disability, cancer, and death. Sexually active women aged 7 and younger should be checked for chlamydia. Older women with new or multiple partners should also be tested for chlamydia. Testing for other STIs is recommended if you are sexually active and at increased risk.  Osteoporosis is a disease in which the bones lose minerals and strength with aging. This can result in serious bone fractures. The risk of osteoporosis can be identified using a  bone density scan. Women ages 30 and over and women at risk for fractures or osteoporosis should discuss screening with their caregivers. Ask your caregiver whether you should take a calcium supplement or vitamin D to reduce the rate of osteoporosis.  Menopause can be associated with physical symptoms and risks. Hormone replacement therapy is available to decrease symptoms and risks. You should talk to your caregiver about whether hormone replacement therapy is right for you.  Use sunscreen. Apply sunscreen liberally and repeatedly throughout the day. You should seek shade  when your shadow is shorter than you. Protect yourself by wearing long sleeves, pants, a wide-brimmed hat, and sunglasses year round, whenever you are outdoors.  Once a month, do a whole body skin exam, using a mirror to look at the skin on your back. Notify your caregiver of new moles, moles that have irregular borders, moles that are larger than a pencil eraser, or moles that have changed in shape or color.  Stay current with required immunizations.  Influenza vaccine. All adults should be immunized every year.  Tetanus, diphtheria, and acellular pertussis (Td, Tdap) vaccine. Pregnant women should receive 1 dose of Tdap vaccine during each pregnancy. The dose should be obtained regardless of the length of time since the last dose. Immunization is preferred during the 27th to 36th week of gestation. An adult who has not previously received Tdap or who does not know her vaccine status should receive 1 dose of Tdap. This initial dose should be followed by tetanus and diphtheria toxoids (Td) booster doses every 10 years. Adults with an unknown or incomplete history of completing a 3-dose immunization series with Td-containing vaccines should begin or complete a primary immunization series including a Tdap dose. Adults should receive a Td booster every 10 years.  Varicella vaccine. An adult without evidence of immunity to varicella  should receive 2 doses or a second dose if she has previously received 1 dose. Pregnant females who do not have evidence of immunity should receive the first dose after pregnancy. This first dose should be obtained before leaving the health care facility. The second dose should be obtained 4 8 weeks after the first dose.  Human papillomavirus (HPV) vaccine. Females aged 79 26 years who have not received the vaccine previously should obtain the 3-dose series. The vaccine is not recommended for use in pregnant females. However, pregnancy testing is not needed before receiving a dose. If a female is found to be pregnant after receiving a dose, no treatment is needed. In that case, the remaining doses should be delayed until after the pregnancy. Immunization is recommended for any person with an immunocompromised condition through the age of 21 years if she did not get any or all doses earlier. During the 3-dose series, the second dose should be obtained 4 8 weeks after the first dose. The third dose should be obtained 24 weeks after the first dose and 16 weeks after the second dose.  Zoster vaccine. One dose is recommended for adults aged 56 years or older unless certain conditions are present.  Measles, mumps, and rubella (MMR) vaccine. Adults born before [birth year] generally are considered immune to measles and mumps. Adults born in [birth year] or later should have 1 or more doses of MMR vaccine unless there is a contraindication to the vaccine or there is laboratory evidence of immunity to each of the three diseases. A routine second dose of MMR vaccine should be obtained at least 28 days after the first dose for students attending postsecondary schools, health care workers, or international travelers. People who received inactivated measles vaccine or an unknown type of measles vaccine during 1963 1967 should receive 2 doses of MMR vaccine. People who received inactivated mumps vaccine or an unknown type of mumps vaccine  before 1979 and are at high risk for mumps infection should consider immunization with 2 doses of MMR vaccine. For females of childbearing age, rubella immunity should be determined. If there is no evidence of immunity, females who are not pregnant should be vaccinated. If there  is no evidence of immunity, females who are pregnant should delay immunization until after pregnancy. Unvaccinated health care workers born before [birth year] who lack laboratory evidence of measles, mumps, or rubella immunity or laboratory confirmation of disease should consider measles and mumps immunization with 2 doses of MMR vaccine or rubella immunization with 1 dose of MMR vaccine.  Pneumococcal 13-valent conjugate (PCV13) vaccine. When indicated, a person who is uncertain of her immunization history and has no record of immunization should receive the PCV13 vaccine. An adult aged 57 years or older who has certain medical conditions and has not been previously immunized should receive 1 dose of PCV13 vaccine. This PCV13 should be followed with a dose of pneumococcal polysaccharide (PPSV23) vaccine. The PPSV23 vaccine dose should be obtained at least 8 weeks after the dose of PCV13 vaccine. An adult aged 27 years or older who has certain medical conditions and previously received 1 or more doses of PPSV23 vaccine should receive 1 dose of PCV13. The PCV13 vaccine dose should be obtained 1 or more years after the last PPSV23 vaccine dose.  Pneumococcal polysaccharide (PPSV23) vaccine. When PCV13 is also indicated, PCV13 should be obtained first. All adults aged 21 years and older should be immunized. An adult younger than age 83 years who has certain medical conditions should be immunized. Any person who resides in a nursing home or long-term care facility should be immunized. An adult smoker should be immunized. People with an immunocompromised condition and certain other conditions should receive both PCV13 and PPSV23 vaccines. People  with human immunodeficiency virus (HIV) infection should be immunized as soon as possible after diagnosis. Immunization during chemotherapy or radiation therapy should be avoided. Routine use of PPSV23 vaccine is not recommended for American Indians, [place] Natives, or people younger than 65 years unless there are medical conditions that require PPSV23 vaccine. When indicated, people who have unknown immunization and have no record of immunization should receive PPSV23 vaccine. One-time revaccination 5 years after the first dose of PPSV23 is recommended for people aged 4 64 years who have chronic kidney failure, nephrotic syndrome, asplenia, or immunocompromised conditions. People who received 1 2 doses of PPSV23 before age 54 years should receive another dose of PPSV23 vaccine at age 47 years or later if at least 5 years have passed since the previous dose. Doses of PPSV23 are not needed for people immunized with PPSV23 at or after age 39 years.  Meningococcal vaccine. Adults with asplenia or persistent complement component deficiencies should receive 2 doses of quadrivalent meningococcal conjugate (MenACWY-D) vaccine. The doses should be obtained at least 2 months apart. Microbiologists working with certain meningococcal bacteria, [employer], people at risk during an outbreak, and people who travel to or live in countries with a high rate of meningitis should be immunized. A first-year college student up through age 49 years who is living in a residence hall should receive a dose if she did not receive a dose on or after her 16th birthday. Adults who have certain high-risk conditions should receive one or more doses of vaccine.  Hepatitis A vaccine. Adults who wish to be protected from this disease, have certain high-risk conditions, work with hepatitis A-infected animals, work in hepatitis A research labs, or travel to or work in countries with a high rate of hepatitis A should be immunized. Adults  who were previously unvaccinated and who anticipate close contact with an international adoptee during the first 60 days after arrival in the [place] from a country  with a high rate of hepatitis A should be immunized.  Hepatitis B vaccine. Adults who wish to be protected from this disease, have certain high-risk conditions, may be exposed to blood or other infectious body fluids, are household contacts or sex partners of hepatitis B positive people, are clients or workers in certain care facilities, or travel to or work in countries with a high rate of hepatitis B should be immunized.  Haemophilus influenzae type b (Hib) vaccine. A previously unvaccinated person with asplenia or sickle cell disease or having a scheduled splenectomy should receive 1 dose of Hib vaccine. Regardless of previous immunization, a recipient of a hematopoietic stem cell transplant should receive a 3-dose series 6 12 months after her successful transplant. Hib vaccine is not recommended for adults with HIV infection. Preventive Services / Frequency Ages 91 to 60  Blood pressure check.** / Every 1 to 2 years.  Lipid and cholesterol check.** / Every 5 years beginning at age 64.  Clinical breast exam.** / Every 3 years for women in their 85s and 65s.  BRCA-related cancer risk assessment.** / For women who have family members with a BRCA-related cancer (breast, ovarian, tubal, or peritoneal cancers).  Pap test.** / Every 2 years from ages 20 through 29. Every 3 years starting at age 5 through age 3 or 18 with a history of 3 consecutive normal Pap tests.  HPV screening.** / Every 3 years from ages 52 through ages 45 to 72 with a history of 3 consecutive normal Pap tests.  Hepatitis C blood test.** / For any individual with known risks for hepatitis C.  Skin self-exam. / Monthly.  Influenza vaccine. / Every year.  Tetanus, diphtheria, and acellular pertussis (Tdap, Td) vaccine.** / Consult your caregiver. Pregnant  women should receive 1 dose of Tdap vaccine during each pregnancy. 1 dose of Td every 10 years.  Varicella vaccine.** / Consult your caregiver. Pregnant females who do not have evidence of immunity should receive the first dose after pregnancy.  HPV vaccine. / 3 doses over 6 months, if 45 and younger. The vaccine is not recommended for use in pregnant females. However, pregnancy testing is not needed before receiving a dose.  Measles, mumps, rubella (MMR) vaccine.** / You need at least 1 dose of MMR if you were born in 1957 or later. You may also need a 2nd dose. For females of childbearing age, rubella immunity should be determined. If there is no evidence of immunity, females who are not pregnant should be vaccinated. If there is no evidence of immunity, females who are pregnant should delay immunization until after pregnancy.  Pneumococcal 13-valent conjugate (PCV13) vaccine.** / Consult your caregiver.  Pneumococcal polysaccharide (PPSV23) vaccine.** / 1 to 2 doses if you smoke cigarettes or if you have certain conditions.  Meningococcal vaccine.** / 1 dose if you are age 53 to 51 years and a Market researcher living in a residence hall, or have one of several medical conditions, you need to get vaccinated against meningococcal disease. You may also need additional booster doses.  Hepatitis A vaccine.** / Consult your caregiver.  Hepatitis B vaccine.** / Consult your caregiver.  Haemophilus influenzae type b (Hib) vaccine.** / Consult your caregiver. Ages 26 to 40  Blood pressure check.** / Every 1 to 2 years.  Lipid and cholesterol check.** / Every 5 years beginning at age 70.  Lung cancer screening. / Every year if you are aged 46 80 years and have a 30-pack-year history of smoking and  currently smoke or have quit within the past 15 years. Yearly screening is stopped once you have quit smoking for at least 15 years or develop a health problem that would prevent you from having  lung cancer treatment.  Clinical breast exam.** / Every year after age 40.  BRCA-related cancer risk assessment.** / For women who have family members with a BRCA-related cancer (breast, ovarian, tubal, or peritoneal cancers).  Mammogram.** / Every year beginning at age 40 and continuing for as long as you are in good health. Consult with your caregiver.  Pap test.** / Every 3 years starting at age 30 through age 65 or 70 with a history of 3 consecutive normal Pap tests.  HPV screening.** / Every 3 years from ages 30 through ages 65 to 70 with a history of 3 consecutive normal Pap tests.  Fecal occult blood test (FOBT) of stool. / Every year beginning at age 50 and continuing until age 75. You may not need to do this test if you get a colonoscopy every 10 years.  Flexible sigmoidoscopy or colonoscopy.** / Every 5 years for a flexible sigmoidoscopy or every 10 years for a colonoscopy beginning at age 50 and continuing until age 75.  Hepatitis C blood test.** / For all people born from 1943 through 1965 and any individual with known risks for hepatitis C.  Skin self-exam. / Monthly.  Influenza vaccine. / Every year.  Tetanus, diphtheria, and acellular pertussis (Tdap/Td) vaccine.** / Consult your caregiver. Pregnant women should receive 1 dose of Tdap vaccine during each pregnancy. 1 dose of Td every 10 years.  Varicella vaccine.** / Consult your caregiver. Pregnant females who do not have evidence of immunity should receive the first dose after pregnancy.  Zoster vaccine.** / 1 dose for adults aged 60 years or older.  Measles, mumps, rubella (MMR) vaccine.** / You need at least 1 dose of MMR if you were born in 1957 or later. You may also need a 2nd dose. For females of childbearing age, rubella immunity should be determined. If there is no evidence of immunity, females who are not pregnant should be vaccinated. If there is no evidence of immunity, females who are pregnant should delay  immunization until after pregnancy.  Pneumococcal 13-valent conjugate (PCV13) vaccine.** / Consult your caregiver.  Pneumococcal polysaccharide (PPSV23) vaccine.** / 1 to 2 doses if you smoke cigarettes or if you have certain conditions.  Meningococcal vaccine.** / Consult your caregiver.  Hepatitis A vaccine.** / Consult your caregiver.  Hepatitis B vaccine.** / Consult your caregiver.  Haemophilus influenzae type b (Hib) vaccine.** / Consult your caregiver. Ages 65 and over  Blood pressure check.** / Every 1 to 2 years.  Lipid and cholesterol check.** / Every 5 years beginning at age 20.  Lung cancer screening. / Every year if you are aged 55 80 years and have a 30-pack-year history of smoking and currently smoke or have quit within the past 15 years. Yearly screening is stopped once you have quit smoking for at least 15 years or develop a health problem that would prevent you from having lung cancer treatment.  Clinical breast exam.** / Every year after age 40.  BRCA-related cancer risk assessment.** / For women who have family members with a BRCA-related cancer (breast, ovarian, tubal, or peritoneal cancers).  Mammogram.** / Every year beginning at age 40 and continuing for as long as you are in good health. Consult with your caregiver.  Pap test.** / Every 3 years starting at age   70 through age 47 or 66 with a 3 consecutive normal Pap tests. Testing can be stopped between 65 and 70 with 3 consecutive normal Pap tests and no abnormal Pap or HPV tests in the past 10 years.  HPV screening.** / Every 3 years from ages 79 through ages 33 or 59 with a history of 3 consecutive normal Pap tests. Testing can be stopped between 65 and 70 with 3 consecutive normal Pap tests and no abnormal Pap or HPV tests in the past 10 years.  Fecal occult blood test (FOBT) of stool. / Every year beginning at age 63 and continuing until age 5. You may not need to do this test if you get a colonoscopy  every 10 years.  Flexible sigmoidoscopy or colonoscopy.** / Every 5 years for a flexible sigmoidoscopy or every 10 years for a colonoscopy beginning at age 31 and continuing until age 71.  Hepatitis C blood test.** / For all people born from [birth year] through 1963 and any individual with known risks for hepatitis C.  Osteoporosis screening.** / A one-time screening for women ages 33 and over and women at risk for fractures or osteoporosis.  Skin self-exam. / Monthly.  Influenza vaccine. / Every year.  Tetanus, diphtheria, and acellular pertussis (Tdap/Td) vaccine.** / 1 dose of Td every 10 years.  Varicella vaccine.** / Consult your caregiver.  Zoster vaccine.** / 1 dose for adults aged 1 years or older.  Pneumococcal 13-valent conjugate (PCV13) vaccine.** / Consult your caregiver.  Pneumococcal polysaccharide (PPSV23) vaccine.** / 1 dose for all adults aged 43 years and older.  Meningococcal vaccine.** / Consult your caregiver.  Hepatitis A vaccine.** / Consult your caregiver.  Hepatitis B vaccine.** / Consult your caregiver.  Haemophilus influenzae type b (Hib) vaccine.** / Consult your caregiver. ** Family history and personal history of risk and conditions may change your caregiver's recommendations. Document Released: 08/27/2001 Document Revised: 10/26/2012 Document Reviewed: 11/26/2010 [facility] Patient Information 2014 [place], [place].

## 2013-07-23 NOTE — Progress Notes (Signed)
Subjective:     [name] [name] is a 68 y.o. female and is here for a comprehensive physical exam. The patient reports L sided neck pain-- no chest pain, no arm pain.  History   Social History  . Marital Status: Married    Spouse Name: N/A    Number of Children: N/A  . Years of Education: N/A   Occupational History  . Daycare p/t    Social History Main Topics  . Smoking status: Former Smoker -- 1.00 packs/day    Types: Cigarettes    Quit date: 07/28/2012  . Smokeless tobacco: Never Used  . Alcohol Use: 0.0 oz/week     Comment: rare  . Drug Use: No  . Sexual Activity: Yes    Partners: Male    Birth Control/ Protection: Post-menopausal   Other Topics Concern  . Not on file   Social History Narrative   Regular exercise- no    Health Maintenance  Topic Date Due  . Pneumococcal Polysaccharide Vaccine Age 49 And Over  05/28/2013  . Influenza Vaccine  02/12/2014  . Colonoscopy  02/20/2015  . Mammogram  04/08/2015  . Tetanus/tdap  01/18/2020  . Zostavax  Completed    The following portions of the patient's history were reviewed and updated as appropriate:  She  has a past medical history of Depression; Allergic rhinitis; Hyperlipidemia; Hyperlipidemia; Seasonal allergies; Arthritis; Hypothyroidism; Endometrial polyp; and Obesity. She  does not have any pertinent problems on file. 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 Her family history includes Breast cancer (age of onset: 62) in her maternal aunt; Cancer (age of onset: 69) in her father; Cancer (age of onset: 71) in her brother and paternal grandmother; Cancer (age of onset: 41) in her sister; Cancer (age of onset: 37) in her mother; Colon cancer in her paternal grandmother; Colon cancer (age of onset: 35) in her father; Heart attack (age of onset: 62) in her maternal  grandmother; Heart attack (age of onset: 58) in her maternal aunt; Heart disease in her maternal grandmother; Pancreatic cancer (age of onset: 60) in her mother; Thyroid disease in her sister and sister. She  reports that she quit smoking about a year ago. Her smoking use included Cigarettes. She smoked 1.00 pack per day. She has never used smokeless tobacco. She reports that she drinks alcohol. She reports that she does not use illicit drugs. She has a current medication list which includes the following prescription(s): alprazolam, astaxanthin, escitalopram, fexofenadine, levothyroxine, multivitamin, simvastatin, and ubiquinol. Current Outpatient Prescriptions on File Prior to Visit  Medication Sig Dispense Refill  . ALPRAZolam (XANAX) 0.25 MG tablet Take 1 tablet (0.25 mg total) by mouth 3 (three) times daily as needed for anxiety.  30 tablet  0  . ASTAXANTHIN PO Take 1 tablet by mouth daily.        Marland Kitchen escitalopram (LEXAPRO) 20 MG tablet Take 1 tablet (20 mg total) by mouth daily.  90 tablet  3  . fexofenadine (ALLEGRA) 180 MG tablet Take 180 mg by mouth daily.        Marland Kitchen levothyroxine (SYNTHROID, LEVOTHROID) 75 MCG tablet Take 1 tablet (75 mcg total) by mouth daily.  90 tablet  3  . multivitamin (THERAGRAN) per tablet Take 1 tablet by mouth daily.        . simvastatin (ZOCOR) 40 MG tablet Take 1 tablet (40 mg total) by mouth  every evening.  90 tablet  3  . Ubiquinol 100 MG CAPS Take by mouth.       No current facility-administered medications on file prior to visit.   She has No Known Allergies..  Review of Systems Review of Systems  Constitutional: Negative for activity change, appetite change and fatigue.  HENT: Negative for hearing loss, congestion, tinnitus and ear discharge.  dentist q88m Eyes: Negative for visual disturbance (see optho q1y -- vision corrected to 20/20 with glasses).  Respiratory: Negative for cough, chest tightness and shortness of breath.   Cardiovascular: Negative for  chest pain, palpitations and leg swelling.  Gastrointestinal: Negative for abdominal pain, diarrhea, constipation and abdominal distention.  Genitourinary: Negative for urgency, frequency, decreased urine volume and difficulty urinating.  Musculoskeletal: Negative for back pain, arthralgias and gait problem.  Skin: Negative for color change, pallor and rash.  Neurological: Negative for dizziness, light-headedness, numbness and headaches.  Hematological: Negative for adenopathy. Does not bruise/bleed easily.  Psychiatric/Behavioral: Negative for suicidal ideas, confusion, sleep disturbance, self-injury, dysphoric mood, decreased concentration and agitation.       Objective:    BP 118/80  Pulse 79  Temp(Src) 98.6 F (37 C) (Oral)  Ht 5' 0.5" (1.537 m)  Wt 260 lb (117.935 kg)  BMI 49.92 kg/m2  SpO2 95% General appearance: alert, cooperative, appears stated age and no distress Head: Normocephalic, without obvious abnormality, atraumatic Eyes: conjunctivae/corneas clear. PERRL, EOM's intact. Fundi benign. Ears: normal TM's and external ear canals both ears Nose: Nares normal. Septum midline. Mucosa normal. No drainage or sinus tenderness. Throat: lips, mucosa, and tongue normal; teeth and gums normal Neck: no adenopathy, no carotid bruit, no JVD, supple, symmetrical, trachea midline and thyroid not enlarged, symmetric, no tenderness/mass/nodules Back: symmetric, no curvature. ROM normal. No CVA tenderness. Lungs: clear to auscultation bilaterally Breasts: normal appearance, no masses or tenderness Heart: regular rate and rhythm, S1, S2 normal, no murmur, click, rub or gallop Abdomen: soft, non-tender; bowel sounds normal; no masses,  no organomegaly Pelvic: not indicated; status post hysterectomy, negative ROS Extremities: extremities normal, atraumatic, no cyanosis or edema Pulses: 2+ and symmetric Skin: Skin color, texture, turgor normal. No rashes or lesions Lymph nodes:  Cervical, supraclavicular, and axillary nodes normal. Neurologic: Alert and oriented X 3, normal strength and tone. Normal symmetric reflexes. Normal coordination and gait Psych--no depression, no anxieyt      Assessment:    Healthy female exam.      Plan:    ghm utd Check labs See After Visit Summary for Counseling Recommendations

## 2013-07-23 NOTE — Assessment & Plan Note (Signed)
Check labs con't meds 

## 2013-08-09 ENCOUNTER — Ambulatory Visit (INDEPENDENT_AMBULATORY_CARE_PROVIDER_SITE_OTHER): Payer: 59 | Admitting: Family Medicine

## 2013-08-09 ENCOUNTER — Encounter: Payer: Self-pay | Admitting: Family Medicine

## 2013-08-09 ENCOUNTER — Ambulatory Visit (HOSPITAL_BASED_OUTPATIENT_CLINIC_OR_DEPARTMENT_OTHER)
Admission: RE | Admit: 2013-08-09 | Discharge: 2013-08-09 | Disposition: A | Discharge status: Home / Self Care | Payer: 59 | Source: Ambulatory Visit | Attending: Family Medicine | Admitting: Family Medicine

## 2013-08-09 VITALS — BP 122/72 | HR 85 | Temp 98.6°F | Wt 268.4 lb

## 2013-08-09 DIAGNOSIS — F411 Generalized anxiety disorder: Secondary | ICD-10-CM

## 2013-08-09 DIAGNOSIS — R109 Unspecified abdominal pain: Secondary | ICD-10-CM | POA: Insufficient documentation

## 2013-08-09 DIAGNOSIS — R1011 Right upper quadrant pain: Secondary | ICD-10-CM

## 2013-08-09 DIAGNOSIS — Z0000 Encounter for general adult medical examination without abnormal findings: Secondary | ICD-10-CM

## 2013-08-09 DIAGNOSIS — K219 Gastro-esophageal reflux disease without esophagitis: Secondary | ICD-10-CM

## 2013-08-09 LAB — BASIC METABOLIC PANEL
BUN: 19 mg/dL (ref 6–23)
CHLORIDE: 104 meq/L (ref 96–112)
CO2: 27 meq/L (ref 19–32)
Calcium: 9.1 mg/dL (ref 8.4–10.5)
Creatinine, Ser: 0.7 mg/dL (ref 0.4–1.2)
GFR: 97.17 mL/min (ref >60.00)
GLUCOSE: 79 mg/dL (ref 70–99)
POTASSIUM: 3.7 meq/L (ref 3.5–5.1)
SODIUM: 138 meq/L (ref 135–145)

## 2013-08-09 LAB — CBC WITH DIFFERENTIAL/PLATELET
BASOS ABS: 0 10*3/uL (ref 0.0–0.1)
Basophils Relative: 0.3 % (ref 0.0–3.0)
Eosinophils Absolute: 0.1 10*3/uL (ref 0.0–0.7)
Eosinophils Relative: 2.5 % (ref 0.0–5.0)
HEMATOCRIT: 40.1 % (ref 36.0–46.0)
HEMOGLOBIN: 13.5 g/dL (ref 12.0–15.0)
LYMPHS ABS: 1.4 10*3/uL (ref 0.7–4.0)
Lymphocytes Relative: 32.4 % (ref 12.0–46.0)
MCHC: 33.7 g/dL (ref 30.0–36.0)
MCV: 89.2 fl (ref 78.0–100.0)
MONO ABS: 0.4 10*3/uL (ref 0.1–1.0)
MONOS PCT: 8.7 % (ref 3.0–12.0)
NEUTROS ABS: 2.4 10*3/uL (ref 1.4–7.7)
Neutrophils Relative %: 56.1 % (ref 43.0–77.0)
Platelets: 167 10*3/uL (ref 150.0–400.0)
RBC: 4.49 Mil/uL (ref 3.87–5.11)
RDW: 14.9 % — AB (ref 11.5–14.6)
WBC: 4.2 10*3/uL — ABNORMAL LOW (ref 4.5–10.5)

## 2013-08-09 LAB — HEPATIC FUNCTION PANEL
ALBUMIN: 3.9 g/dL (ref 3.5–5.2)
ALT: 17 U/L (ref 0–35)
AST: 20 U/L (ref 0–37)
Alkaline Phosphatase: 71 U/L (ref 39–117)
Bilirubin, Direct: 0 mg/dL (ref 0.0–0.3)
Total Bilirubin: 0.2 mg/dL — ABNORMAL LOW (ref 0.3–1.2)
Total Protein: 6.6 g/dL (ref 6.0–8.3)

## 2013-08-09 LAB — POCT URINALYSIS DIPSTICK
BILIRUBIN UA: NEGATIVE
GLUCOSE UA: NEGATIVE
KETONES UA: NEGATIVE
Leukocytes, UA: NEGATIVE
Nitrite, UA: NEGATIVE
Protein, UA: NEGATIVE
RBC UA: NEGATIVE
UROBILINOGEN UA: 0.2
pH, UA: 5

## 2013-08-09 LAB — AMYLASE: Amylase: 70 U/L (ref 27–131)

## 2013-08-09 LAB — LIPASE: Lipase: 13 U/L (ref 11.0–59.0)

## 2013-08-09 MED ORDER — LANSOPRAZOLE 30 MG PO CPDR: 30.0000 mg | DELAYED_RELEASE_CAPSULE | Freq: Every day | ORAL | Status: DC

## 2013-08-09 NOTE — Progress Notes (Signed)
  Subjective:     [name] [name] is a 68 y.o. female who presents for evaluation of abdominal pain. Onset was a few weeks ago. Symptoms have been gradually worsening. The pain is described as sharp, and is 8/10 in intensity. Pain is located in the RUQ without radiation.  Aggravating factors: none.  Alleviating factors: none. Associated symptoms: belching, diarrhea and nausea. The patient denies anorexia, arthralagias, chills, constipation, dysuria, fever, flatus, frequency, headache, hematochezia, hematuria, melena, myalgias and vomiting.  The patient's history has been marked as reviewed and updated as appropriate.  Review of Systems Pertinent items are noted in HPI.     Objective:    BP 122/72  Pulse 85  Temp(Src) 98.6 F (37 C) (Oral)  Wt 268 lb 6.4 oz (121.745 kg)  SpO2 95% General appearance: alert, cooperative, appears stated age and mild distress Throat: lips, mucosa, and tongue normal; teeth and gums normal Neck: no adenopathy, no carotid bruit, no JVD, supple, symmetrical, trachea midline and thyroid not enlarged, symmetric, no tenderness/mass/nodules Abdomen: abnormal findings:  moderate tenderness in the RUQ    Assessment:    Abdominal pain, likely secondary to GB .    Plan:    The diagnosis was discussed with the patient and evaluation and treatment plans outlined. See orders for lab and imaging studies. Initiate empiric trial of acid suppression; see orders. Further follow-up plans will be based on outcome of lab/imaging studies; see orders.

## 2013-08-09 NOTE — Patient Instructions (Signed)
Abdominal Pain, Adult °Many things can cause abdominal pain. Usually, abdominal pain is not caused by a disease and will improve without treatment. It can often be observed and treated at home. Your health care provider will do a physical exam and possibly order blood tests and X-rays to help determine the seriousness of your pain. However, in many cases, more time must pass before a clear cause of the pain can be found. Before that point, your health care provider may not know if you need more testing or further treatment. °HOME CARE INSTRUCTIONS  °Monitor your abdominal pain for any changes. The following actions may help to alleviate any discomfort you are experiencing: °· Only take over-the-counter or prescription medicines as directed by your health care provider. °· Do not take laxatives unless directed to do so by your health care provider. °· Try a clear liquid diet (broth, tea, or water) as directed by your health care provider. Slowly move to a bland diet as tolerated. °SEEK MEDICAL CARE IF: °· You have unexplained abdominal pain. °· You have abdominal pain associated with nausea or diarrhea. °· You have pain when you urinate or have a bowel movement. °· You experience abdominal pain that wakes you in the night. °· You have abdominal pain that is worsened or improved by eating food. °· You have abdominal pain that is worsened with eating fatty foods. °SEEK IMMEDIATE MEDICAL CARE IF:  °· Your pain does not go away within 2 hours. °· You have a fever. °· You keep throwing up (vomiting). °· Your pain is felt only in portions of the abdomen, such as the right side or the left lower portion of the abdomen. °· You pass bloody or black tarry stools. °MAKE SURE YOU: °· Understand these instructions.   °· Will watch your condition.   °· Will get help right away if you are not doing well or get worse.   °Document Released: 04/10/2005 Document Revised: 04/21/2013 Document Reviewed: 03/10/2013 °[employer]® Patient  Information ©2014 [employer]. ° °

## 2013-08-09 NOTE — Progress Notes (Signed)
Pre visit review using our clinic review tool, if applicable. No additional management support is needed unless otherwise documented below in the visit note. 

## 2013-10-15 ENCOUNTER — Encounter: Payer: Self-pay | Admitting: Family Medicine

## 2013-10-15 DIAGNOSIS — K219 Gastro-esophageal reflux disease without esophagitis: Secondary | ICD-10-CM

## 2013-10-18 MED ORDER — LANSOPRAZOLE 15 MG PO CPDR: 15.0000 mg | DELAYED_RELEASE_CAPSULE | Freq: Every day | ORAL | Status: DC

## 2013-10-21 ENCOUNTER — Other Ambulatory Visit (INDEPENDENT_AMBULATORY_CARE_PROVIDER_SITE_OTHER): Payer: 59

## 2013-10-21 DIAGNOSIS — R7989 Other specified abnormal findings of blood chemistry: Secondary | ICD-10-CM

## 2013-10-21 LAB — HEMOGLOBIN A1C: HEMOGLOBIN A1C: 5.8 % (ref 4.6–6.5)

## 2013-10-21 LAB — BASIC METABOLIC PANEL
BUN: 15 mg/dL (ref 6–23)
CALCIUM: 9.1 mg/dL (ref 8.4–10.5)
CO2: 29 meq/L (ref 19–32)
Chloride: 104 mEq/L (ref 96–112)
Creatinine, Ser: 0.7 mg/dL (ref 0.4–1.2)
GFR: 90.64 mL/min (ref >60.00)
Glucose, Bld: 98 mg/dL (ref 70–99)
Potassium: 4.2 mEq/L (ref 3.5–5.1)
Sodium: 139 mEq/L (ref 135–145)

## 2014-01-21 ENCOUNTER — Ambulatory Visit: Payer: 59 | Admitting: Family Medicine

## 2014-02-04 ENCOUNTER — Ambulatory Visit: Payer: 59 | Admitting: Family Medicine

## 2014-02-24 ENCOUNTER — Ambulatory Visit (INDEPENDENT_AMBULATORY_CARE_PROVIDER_SITE_OTHER): Payer: 59 | Admitting: Family Medicine

## 2014-02-24 ENCOUNTER — Encounter: Payer: Self-pay | Admitting: Family Medicine

## 2014-02-24 VITALS — BP 120/72 | HR 73 | Temp 98.2°F | Ht 61.5 in | Wt 262.0 lb

## 2014-02-24 DIAGNOSIS — R197 Diarrhea, unspecified: Secondary | ICD-10-CM

## 2014-02-24 DIAGNOSIS — E785 Hyperlipidemia, unspecified: Secondary | ICD-10-CM

## 2014-02-24 DIAGNOSIS — I10 Essential (primary) hypertension: Secondary | ICD-10-CM

## 2014-02-24 LAB — LIPID PANEL
CHOLESTEROL: 130 mg/dL (ref 0–200)
HDL: 58 mg/dL (ref >39.00)
LDL Cholesterol: 59 mg/dL (ref 0–99)
NonHDL: 72
Total CHOL/HDL Ratio: 2
Triglycerides: 66 mg/dL (ref 0.0–149.0)
VLDL: 13.2 mg/dL (ref 0.0–40.0)

## 2014-02-24 LAB — CBC WITH DIFFERENTIAL/PLATELET
BASOS ABS: 0 10*3/uL (ref 0.0–0.1)
Basophils Relative: 0.7 % (ref 0.0–3.0)
Eosinophils Absolute: 0.1 10*3/uL (ref 0.0–0.7)
Eosinophils Relative: 2 % (ref 0.0–5.0)
HEMATOCRIT: 40.8 % (ref 36.0–46.0)
Hemoglobin: 13.8 g/dL (ref 12.0–15.0)
LYMPHS ABS: 1 10*3/uL (ref 0.7–4.0)
Lymphocytes Relative: 21.2 % (ref 12.0–46.0)
MCHC: 33.9 g/dL (ref 30.0–36.0)
MCV: 88.5 fl (ref 78.0–100.0)
MONO ABS: 0.3 10*3/uL (ref 0.1–1.0)
MONOS PCT: 7.5 % (ref 3.0–12.0)
NEUTROS PCT: 68.6 % (ref 43.0–77.0)
Neutro Abs: 3.1 10*3/uL (ref 1.4–7.7)
PLATELETS: 156 10*3/uL (ref 150.0–400.0)
RBC: 4.61 Mil/uL (ref 3.87–5.11)
RDW: 14.8 % (ref 11.5–15.5)
WBC: 4.6 10*3/uL (ref 4.0–10.5)

## 2014-02-24 LAB — HEPATIC FUNCTION PANEL
ALBUMIN: 4.1 g/dL (ref 3.5–5.2)
ALT: 17 U/L (ref 0–35)
AST: 22 U/L (ref 0–37)
Alkaline Phosphatase: 73 U/L (ref 39–117)
Bilirubin, Direct: 0.1 mg/dL (ref 0.0–0.3)
Total Bilirubin: 0.5 mg/dL (ref 0.2–1.2)
Total Protein: 6.8 g/dL (ref 6.0–8.3)

## 2014-02-24 LAB — BASIC METABOLIC PANEL
BUN: 13 mg/dL (ref 6–23)
CO2: 28 mEq/L (ref 19–32)
Calcium: 9.2 mg/dL (ref 8.4–10.5)
Chloride: 104 mEq/L (ref 96–112)
Creatinine, Ser: 0.8 mg/dL (ref 0.4–1.2)
GFR: 82.24 mL/min (ref >60.00)
GLUCOSE: 97 mg/dL (ref 70–99)
Potassium: 4 mEq/L (ref 3.5–5.1)
Sodium: 139 mEq/L (ref 135–145)

## 2014-02-24 LAB — HEMOGLOBIN A1C: Hgb A1c MFr Bld: 5.7 % (ref 4.6–6.5)

## 2014-02-24 MED ORDER — LOPERAMIDE HCL 2 MG PO TABS
ORAL_TABLET | ORAL | Status: DC
Start: 2014-02-24 — End: 2014-09-19

## 2014-02-24 NOTE — Progress Notes (Signed)
Pre visit review using our clinic review tool, if applicable. No additional management support is needed unless otherwise documented below in the visit note. 

## 2014-02-24 NOTE — Addendum Note (Signed)
Addended by: Rosalita Chessman on: 02/24/2014 11:54 AM   Modules accepted: Level of Service

## 2014-02-25 LAB — NICOTINE/COTININE METABOLITES: Cotinine: <10 ng/mL

## 2014-02-28 ENCOUNTER — Telehealth: Payer: Self-pay

## 2014-02-28 NOTE — Telephone Encounter (Signed)
Call from the pharmacy and they advised that the imodium 2 mg tablet is OTC. The will have to cancel the Rx and the patient can get it OTC or would you like to switch to the capsule. Per Dr.Lowe the patient is only taking half so she can not break a capsule. She advised to make the patient aware and see what she would like to do. I call the patient and Left a message to call the office.     KP

## 2014-03-01 ENCOUNTER — Encounter: Payer: Self-pay | Admitting: Family Medicine

## 2014-03-01 NOTE — Telephone Encounter (Signed)
Spoke with patient and she advised the GI doctor took care of the script for her.     KP

## 2014-03-01 NOTE — Telephone Encounter (Signed)
Caller name: Myrtie  Relation to pt: self  Call back number: [phone] Reason for call: Pt returned your call pt states if she does not pick up leave a detailed message.

## 2014-03-07 ENCOUNTER — Encounter: Payer: Self-pay | Admitting: Family Medicine

## 2014-04-16 ENCOUNTER — Other Ambulatory Visit: Payer: Self-pay | Admitting: Family Medicine

## 2014-05-04 ENCOUNTER — Other Ambulatory Visit: Payer: Self-pay | Admitting: Family Medicine

## 2014-05-04 DIAGNOSIS — Z1231 Encounter for screening mammogram for malignant neoplasm of breast: Secondary | ICD-10-CM

## 2014-05-09 ENCOUNTER — Ambulatory Visit (HOSPITAL_BASED_OUTPATIENT_CLINIC_OR_DEPARTMENT_OTHER)
Admission: RE | Admit: 2014-05-09 | Discharge: 2014-05-09 | Disposition: A | Discharge status: Home / Self Care | Payer: 59 | Source: Ambulatory Visit | Attending: Family Medicine | Admitting: Family Medicine

## 2014-05-09 DIAGNOSIS — Z1231 Encounter for screening mammogram for malignant neoplasm of breast: Secondary | ICD-10-CM | POA: Insufficient documentation

## 2014-08-30 ENCOUNTER — Telehealth: Payer: Self-pay | Admitting: Family Medicine

## 2014-08-30 NOTE — Telephone Encounter (Signed)
error:[id number] ° °

## 2014-09-05 ENCOUNTER — Other Ambulatory Visit: Payer: Self-pay | Admitting: Family Medicine

## 2014-09-05 ENCOUNTER — Telehealth: Payer: Self-pay | Admitting: Family Medicine

## 2014-09-05 DIAGNOSIS — E039 Hypothyroidism, unspecified: Secondary | ICD-10-CM

## 2014-09-05 DIAGNOSIS — I10 Essential (primary) hypertension: Secondary | ICD-10-CM

## 2014-09-05 NOTE — Telephone Encounter (Signed)
Caller name:Ronnita Stave  Relationship to patient:self Can be reached:[phone]   Reason for call: PT wanting to make appointment for just med check questions about new meds- / BP check/ Labs- does not want Pap or EKG- states does not want a full physical. Please advise if office visit for 30 minutes or 15 minutes with lab appointment attached should be made.

## 2014-09-05 NOTE — Telephone Encounter (Signed)
15 min visit-- can get labs before--order is in

## 2014-09-05 NOTE — Telephone Encounter (Signed)
PT also requesting that Labs be done prior to office visit so that results can be discussed-

## 2014-09-05 NOTE — Telephone Encounter (Signed)
Please advise      KP 

## 2014-09-06 NOTE — Telephone Encounter (Signed)
appt scheduled

## 2014-09-08 ENCOUNTER — Other Ambulatory Visit (INDEPENDENT_AMBULATORY_CARE_PROVIDER_SITE_OTHER): Payer: 59

## 2014-09-08 DIAGNOSIS — E039 Hypothyroidism, unspecified: Secondary | ICD-10-CM

## 2014-09-08 DIAGNOSIS — I10 Essential (primary) hypertension: Secondary | ICD-10-CM

## 2014-09-08 LAB — POCT URINALYSIS DIPSTICK
Bilirubin, UA: NEGATIVE
Glucose, UA: NEGATIVE
KETONES UA: NEGATIVE
LEUKOCYTES UA: NEGATIVE
Nitrite, UA: NEGATIVE
PH UA: 6
PROTEIN UA: NEGATIVE
RBC UA: NEGATIVE
SPEC GRAV UA: 1.02
Urobilinogen, UA: 0.2

## 2014-09-08 LAB — LIPID PANEL
CHOL/HDL RATIO: 2
Cholesterol: 157 mg/dL (ref 0–200)
HDL: 66.3 mg/dL (ref >39.00)
LDL CALC: 72 mg/dL (ref 0–99)
NonHDL: 90.7
Triglycerides: 92 mg/dL (ref 0.0–149.0)
VLDL: 18.4 mg/dL (ref 0.0–40.0)

## 2014-09-08 LAB — BASIC METABOLIC PANEL
BUN: 11 mg/dL (ref 6–23)
CALCIUM: 9.3 mg/dL (ref 8.4–10.5)
CO2: 30 mEq/L (ref 19–32)
Chloride: 106 mEq/L (ref 96–112)
Creatinine, Ser: 0.76 mg/dL (ref 0.40–1.20)
GFR: 80.86 mL/min (ref >60.00)
Glucose, Bld: 106 mg/dL — ABNORMAL HIGH (ref 70–99)
Potassium: 5 mEq/L (ref 3.5–5.1)
Sodium: 142 mEq/L (ref 135–145)

## 2014-09-08 LAB — HEPATIC FUNCTION PANEL
ALK PHOS: 83 U/L (ref 39–117)
ALT: 12 U/L (ref 0–35)
AST: 18 U/L (ref 0–37)
Albumin: 4.3 g/dL (ref 3.5–5.2)
Bilirubin, Direct: 0.1 mg/dL (ref 0.0–0.3)
Total Bilirubin: 0.4 mg/dL (ref 0.2–1.2)
Total Protein: 6.8 g/dL (ref 6.0–8.3)

## 2014-09-08 LAB — CBC WITH DIFFERENTIAL/PLATELET
Basophils Absolute: 0 10*3/uL (ref 0.0–0.1)
Basophils Relative: 0.4 % (ref 0.0–3.0)
EOS ABS: 0.1 10*3/uL (ref 0.0–0.7)
Eosinophils Relative: 2.6 % (ref 0.0–5.0)
HCT: 41.6 % (ref 36.0–46.0)
Hemoglobin: 14 g/dL (ref 12.0–15.0)
LYMPHS PCT: 29.2 % (ref 12.0–46.0)
Lymphs Abs: 1.2 10*3/uL (ref 0.7–4.0)
MCHC: 33.7 g/dL (ref 30.0–36.0)
MCV: 87.6 fl (ref 78.0–100.0)
Monocytes Absolute: 0.3 10*3/uL (ref 0.1–1.0)
Monocytes Relative: 8 % (ref 3.0–12.0)
NEUTROS PCT: 59.8 % (ref 43.0–77.0)
Neutro Abs: 2.4 10*3/uL (ref 1.4–7.7)
Platelets: 176 10*3/uL (ref 150.0–400.0)
RBC: 4.75 Mil/uL (ref 3.87–5.11)
RDW: 14.8 % (ref 11.5–15.5)
WBC: 4.1 10*3/uL (ref 4.0–10.5)

## 2014-09-08 LAB — TSH: TSH: 5.34 u[IU]/mL — ABNORMAL HIGH (ref 0.35–4.50)

## 2014-09-13 ENCOUNTER — Other Ambulatory Visit: Payer: Self-pay

## 2014-09-13 MED ORDER — LEVOTHYROXINE SODIUM 100 MCG PO TABS: 100.0000 ug | ORAL_TABLET | Freq: Every day | ORAL | Status: DC

## 2014-09-19 ENCOUNTER — Encounter: Payer: Self-pay | Admitting: Family Medicine

## 2014-09-19 ENCOUNTER — Ambulatory Visit (INDEPENDENT_AMBULATORY_CARE_PROVIDER_SITE_OTHER): Payer: 59 | Admitting: Family Medicine

## 2014-09-19 VITALS — BP 114/74 | HR 80 | Temp 99.2°F | Wt 266.4 lb

## 2014-09-19 DIAGNOSIS — E785 Hyperlipidemia, unspecified: Secondary | ICD-10-CM

## 2014-09-19 DIAGNOSIS — R197 Diarrhea, unspecified: Secondary | ICD-10-CM

## 2014-09-19 DIAGNOSIS — F419 Anxiety disorder, unspecified: Secondary | ICD-10-CM

## 2014-09-19 DIAGNOSIS — E039 Hypothyroidism, unspecified: Secondary | ICD-10-CM

## 2014-09-19 MED ORDER — LOPERAMIDE HCL 2 MG PO TABS: ORAL_TABLET | ORAL | Status: DC

## 2014-09-19 MED ORDER — ALPRAZOLAM 0.25 MG PO TABS
0.2500 mg | ORAL_TABLET | Freq: Three times a day (TID) | ORAL | Status: DC | PRN
Start: 2014-09-19 — End: 2015-01-23

## 2014-09-19 NOTE — Assessment & Plan Note (Signed)
Synthroid just adjusted Recheck 2 months

## 2014-09-19 NOTE — Assessment & Plan Note (Signed)
con't zocor Lab Results  Component Value Date   CHOL 157 09/08/2014   HDL 66.30 09/08/2014   LDLCALC 72 09/08/2014   LDLDIRECT 129.1 06/13/2011   TRIG 92.0 09/08/2014   CHOLHDL 2 09/08/2014

## 2014-09-19 NOTE — Progress Notes (Signed)
Pre visit review using our clinic review tool, if applicable. No additional management support is needed unless otherwise documented below in the visit note. 

## 2014-09-19 NOTE — Progress Notes (Signed)
Patient ID: [name] [name], female    DOB: 1945-10-30  Age: 69 y.o. MRN: [id number]    Subjective:  Subjective HPI [name] [name] presents for f/u thyroid.  She has been depressed and we are hoping the adjustment in her synthroid will help.  She is also here to discuss her labs.  Review of Systems  Constitutional: Positive for fatigue. Negative for activity change, appetite change and unexpected weight change.  Respiratory: Negative for cough and shortness of breath.   Cardiovascular: Negative for chest pain and palpitations.  Musculoskeletal: Negative.   Psychiatric/Behavioral: Positive for dysphoric mood. Negative for suicidal ideas, behavioral problems, sleep disturbance and self-injury. The patient is not nervous/anxious.      History Past Medical History  Diagnosis Date  . Depression   . Allergic rhinitis   . Hyperlipidemia   . Hyperlipidemia     on simvastatin  . Seasonal allergies   . Arthritis   . Hypothyroidism   . Endometrial polyp   . 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   Her family history includes Breast cancer (age of onset: 57) in her maternal aunt; Cancer (age of onset: 82) in her father; Cancer (age of onset: 58) in her brother and paternal grandmother; Cancer (age of onset: 54) in her sister; Cancer (age of onset: 56) in her mother; Colon cancer in her paternal grandmother; Colon cancer (age of onset: 76) in her father; Heart attack (age of onset: 42) in her maternal grandmother; Heart attack (age of onset: 46) in her maternal aunt; Heart disease in her maternal grandmother; Pancreatic cancer (age of onset: 24) in her mother; Thyroid disease in her sister and sister.She reports that she quit smoking about 2 years ago. Her smoking use included Cigarettes. She smoked 1.00 pack per day. She has  never used smokeless tobacco. She reports that she drinks alcohol. She reports that she does not use illicit drugs.  Current Outpatient Prescriptions on File Prior to Visit  Medication Sig Dispense Refill  . ASTAXANTHIN PO Take 1 tablet by mouth daily.      Marland Kitchen escitalopram (LEXAPRO) 20 MG tablet Take 1 tablet (20 mg total) by mouth daily. Office visit due now 90 tablet 0  . fexofenadine (ALLEGRA) 180 MG tablet Take 180 mg by mouth daily.      Marland Kitchen levothyroxine (SYNTHROID, LEVOTHROID) 100 MCG tablet Take 1 tablet (100 mcg total) by mouth daily before breakfast. 90 tablet 0  . multivitamin (THERAGRAN) per tablet Take 1 tablet by mouth daily.      . simvastatin (ZOCOR) 40 MG tablet Take 1 tablet by mouth  every evening 90 tablet 0  . Ubiquinol 100 MG CAPS Take by mouth.     No current facility-administered medications on file prior to visit.     Objective:  Objective Physical Exam  Constitutional: She is oriented to person, place, and time. She appears well-developed and well-nourished. No distress.  HENT:  Right Ear: External ear normal.  Left Ear: External ear normal.  Nose: Nose normal.  Mouth/Throat: Oropharynx is clear and moist.  Eyes: EOM are normal. Pupils are equal, round, and reactive to light.  Neck: Normal range of motion. Neck supple.  Cardiovascular: Normal rate, regular rhythm and normal heart sounds.   No murmur heard. Pulmonary/Chest: Effort normal and breath sounds normal. No respiratory distress. She has no wheezes. She has  no rales. She exhibits no tenderness.  Neurological: She is alert and oriented to person, place, and time.  Psychiatric: She has a normal mood and affect. Her behavior is normal. Judgment and thought content normal.   BP 114/74 mmHg  Pulse 80  Temp(Src) 99.2 F (37.3 C) (Oral)  Wt 266 lb 6.4 oz (120.838 kg)  SpO2 95% Wt Readings from Last 3 Encounters:  09/19/14 266 lb 6.4 oz (120.838 kg)  02/24/14 262 lb (118.842 kg)  08/09/13 268 lb 6.4 oz  (121.745 kg)     Lab Results  Component Value Date   WBC 4.1 09/08/2014   HGB 14.0 09/08/2014   HCT 41.6 09/08/2014   PLT 176.0 09/08/2014   GLUCOSE 106* 09/08/2014   CHOL 157 09/08/2014   TRIG 92.0 09/08/2014   HDL 66.30 09/08/2014   LDLDIRECT 129.1 06/13/2011   LDLCALC 72 09/08/2014   ALT 12 09/08/2014   AST 18 09/08/2014   NA 142 09/08/2014   K 5.0 09/08/2014   CL 106 09/08/2014   CREATININE 0.76 09/08/2014   BUN 11 09/08/2014   CO2 30 09/08/2014   TSH 5.34* 09/08/2014   HGBA1C 5.7 02/24/2014   MICROALBUR 0.2 02/01/2013    Mm Digital Screening Bilateral  05/11/2014   CLINICAL DATA:  Screening.  EXAM: DIGITAL SCREENING BILATERAL MAMMOGRAM WITH CAD  COMPARISON:  Previous exam(s)  ACR Breast Density Category a: The breast tissue is almost entirely fatty.  FINDINGS: There are no findings suspicious for malignancy. Images were processed with CAD.  IMPRESSION: No mammographic evidence of malignancy. A result letter of this screening mammogram will be mailed directly to the patient.  RECOMMENDATION: Screening mammogram in one year. (Code:SM-B-01Y)  BI-RADS CATEGORY  1: Negative.   Electronically Signed   By: Lajean Manes M.D.   On: 05/11/2014 10:19     Assessment & Plan:  Plan I have changed Ms. Penner's loperamide. I am also having her maintain her fexofenadine, multivitamin, ASTAXANTHIN PO, Ubiquinol, escitalopram, simvastatin, levothyroxine, and ALPRAZolam.  Meds ordered this encounter  Medications  . loperamide (IMODIUM A-D) 2 MG tablet    Sig: 1 po bid    Dispense:  180 tablet    Refill:  3  . ALPRAZolam (XANAX) 0.25 MG tablet    Sig: Take 1 tablet (0.25 mg total) by mouth 3 (three) times daily as needed for anxiety.    Dispense:  30 tablet    Refill:  0    Problem List Items Addressed This Visit      Unprioritized   Hypothyroidism    Synthroid just adjusted Recheck 2 months      Relevant Orders   Thyroid Panel With TSH   Hyperlipidemia    con't  zocor Lab Results  Component Value Date   CHOL 157 09/08/2014   HDL 66.30 09/08/2014   LDLCALC 72 09/08/2014   LDLDIRECT 129.1 06/13/2011   TRIG 92.0 09/08/2014   CHOLHDL 2 09/08/2014          Other Visit Diagnoses    Diarrhea    -  Primary    Relevant Medications    loperamide (IMODIUM A-D) 2 MG tablet    Anxiety        Relevant Medications    ALPRAZolam  (XANAX) tablet       Follow-up: Return in about 2 months (around 11/19/2014), or if symptoms worsen or fail to improve, for lab only.  Garnet Koyanagi, DO

## 2014-09-19 NOTE — Patient Instructions (Signed)

## 2014-09-23 ENCOUNTER — Encounter: Payer: Self-pay | Admitting: Family Medicine

## 2014-11-02 ENCOUNTER — Encounter: Payer: Self-pay | Admitting: Family Medicine

## 2014-11-11 ENCOUNTER — Other Ambulatory Visit: Payer: Self-pay | Admitting: Family Medicine

## 2014-11-21 ENCOUNTER — Other Ambulatory Visit (INDEPENDENT_AMBULATORY_CARE_PROVIDER_SITE_OTHER): Payer: 59

## 2014-11-21 ENCOUNTER — Encounter: Payer: Self-pay | Admitting: Family Medicine

## 2014-11-21 DIAGNOSIS — E039 Hypothyroidism, unspecified: Secondary | ICD-10-CM | POA: Diagnosis not present

## 2014-11-21 LAB — THYROID PANEL WITH TSH
FREE THYROXINE INDEX: 2.8 (ref 1.4–3.8)
T3 Uptake: 31 % (ref 22–35)
T4 TOTAL: 9 ug/dL (ref 4.5–12.0)
TSH: 3.491 u[IU]/mL (ref 0.350–4.500)

## 2014-11-22 MED ORDER — LEVOTHYROXINE SODIUM 100 MCG PO TABS: 100.0000 ug | ORAL_TABLET | Freq: Every day | ORAL | Status: DC

## 2014-12-01 ENCOUNTER — Telehealth: Payer: Self-pay | Admitting: Family Medicine

## 2014-12-01 NOTE — Telephone Encounter (Signed)
Caller name:Kailani Relationship to patient:self Can be reached:[phone] Pharmacy:[facility]  Reason for call:when she received her meds she had a note attached saying to call the office

## 2014-12-01 NOTE — Telephone Encounter (Signed)
Patient aware that she is good due to labs being current and she verbalized understanding, I advised that was on the bottle from when she needed a repeat TSH.     KP

## 2014-12-19 ENCOUNTER — Encounter: Payer: Self-pay | Admitting: Family Medicine

## 2014-12-20 ENCOUNTER — Other Ambulatory Visit: Payer: Self-pay | Admitting: Family Medicine

## 2014-12-20 DIAGNOSIS — E785 Hyperlipidemia, unspecified: Secondary | ICD-10-CM

## 2014-12-23 ENCOUNTER — Other Ambulatory Visit (INDEPENDENT_AMBULATORY_CARE_PROVIDER_SITE_OTHER): Payer: 59

## 2014-12-23 DIAGNOSIS — E785 Hyperlipidemia, unspecified: Secondary | ICD-10-CM | POA: Diagnosis not present

## 2014-12-23 LAB — HEPATIC FUNCTION PANEL
ALT: 15 U/L (ref 0–35)
AST: 21 U/L (ref 0–37)
Albumin: 4.2 g/dL (ref 3.5–5.2)
Alkaline Phosphatase: 74 U/L (ref 39–117)
Bilirubin, Direct: 0.1 mg/dL (ref 0.0–0.3)
Total Bilirubin: 0.4 mg/dL (ref 0.2–1.2)
Total Protein: 6.5 g/dL (ref 6.0–8.3)

## 2014-12-23 LAB — BASIC METABOLIC PANEL WITH GFR
BUN: 13 mg/dL (ref 6–23)
CO2: 28 meq/L (ref 19–32)
Calcium: 9.2 mg/dL (ref 8.4–10.5)
Chloride: 104 meq/L (ref 96–112)
Creatinine, Ser: 0.64 mg/dL (ref 0.40–1.20)
GFR: 98.5 mL/min
Glucose, Bld: 92 mg/dL (ref 70–99)
Potassium: 3.8 meq/L (ref 3.5–5.1)
Sodium: 139 meq/L (ref 135–145)

## 2014-12-23 LAB — LIPID PANEL
Cholesterol: 134 mg/dL (ref 0–200)
HDL: 64.8 mg/dL (ref >39.00)
LDL Cholesterol: 56 mg/dL (ref 0–99)
NonHDL: 69.2
Total CHOL/HDL Ratio: 2
Triglycerides: 65 mg/dL (ref 0.0–149.0)
VLDL: 13 mg/dL (ref 0.0–40.0)

## 2014-12-26 ENCOUNTER — Encounter: Payer: Self-pay | Admitting: Family Medicine

## 2014-12-27 ENCOUNTER — Encounter: Payer: Self-pay | Admitting: *Deleted

## 2015-01-11 ENCOUNTER — Other Ambulatory Visit: Payer: Self-pay | Admitting: Family Medicine

## 2015-01-14 ENCOUNTER — Encounter: Payer: Self-pay | Admitting: Family Medicine

## 2015-01-23 ENCOUNTER — Encounter: Payer: Self-pay | Admitting: Family Medicine

## 2015-01-23 ENCOUNTER — Ambulatory Visit (INDEPENDENT_AMBULATORY_CARE_PROVIDER_SITE_OTHER): Payer: 59 | Admitting: Family Medicine

## 2015-01-23 VITALS — BP 120/76 | HR 74 | Temp 98.8°F | Ht 63.0 in | Wt 251.0 lb

## 2015-01-23 DIAGNOSIS — Z87891 Personal history of nicotine dependence: Secondary | ICD-10-CM | POA: Diagnosis not present

## 2015-01-23 DIAGNOSIS — E785 Hyperlipidemia, unspecified: Secondary | ICD-10-CM

## 2015-01-23 DIAGNOSIS — Z0000 Encounter for general adult medical examination without abnormal findings: Secondary | ICD-10-CM | POA: Diagnosis not present

## 2015-01-23 DIAGNOSIS — G454 Transient global amnesia: Secondary | ICD-10-CM | POA: Diagnosis not present

## 2015-01-23 DIAGNOSIS — F419 Anxiety disorder, unspecified: Secondary | ICD-10-CM | POA: Diagnosis not present

## 2015-01-23 LAB — HEMOGLOBIN A1C: Hgb A1c MFr Bld: 5.5 % (ref 4.6–6.5)

## 2015-01-23 MED ORDER — ALPRAZOLAM 0.25 MG PO TABS: 0.2500 mg | ORAL_TABLET | Freq: Three times a day (TID) | ORAL | Status: DC | PRN

## 2015-01-23 MED ORDER — ASPIRIN EC 81 MG PO TBEC: 81.0000 mg | DELAYED_RELEASE_TABLET | Freq: Every day | ORAL | Status: DC

## 2015-01-23 NOTE — Progress Notes (Signed)
Patient ID: [name] [name], female    DOB: March 26, 1946  Age: 69 y.o. MRN: [id number]    Subjective:  Subjective HPI [name] [name] presents for f/u cholesterol  She would like to come off the statin.  She is not having any problems ---she just wants to stop it.  In 2008 she had an episode of Transient global Myriam Jacobson and she saw a neurologist and she was told it was not a precursor to alzheirmers.   This occurred again last week.  She told her husband "she was blocking something"--- it lasted 5 min and then she was back to normal.  She is requesting to follow back up with neuro.    Review of Systems  Constitutional: Negative for activity change, appetite change, fatigue and unexpected weight change.  Respiratory: Negative for cough and shortness of breath.   Cardiovascular: Negative for chest pain and palpitations.  Neurological: Negative for dizziness, seizures, speech difficulty, weakness, light-headedness, numbness and headaches.  Psychiatric/Behavioral: Negative for behavioral problems and dysphoric mood. The patient is not nervous/anxious.     History Past Medical History  Diagnosis Date  . Depression   . Allergic rhinitis   . Hyperlipidemia   . Hyperlipidemia     on simvastatin  . Seasonal allergies   . Arthritis   . Hypothyroidism   . Endometrial polyp   . 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   Her family history includes Breast cancer (age of onset: 61) in her maternal aunt; Cancer (age of onset: 49) in her father; Cancer (age of onset: 47) in her brother and paternal grandmother; Cancer (age of onset: 66) in her sister; Cancer (age of onset: 31) in her mother; Colon cancer in her paternal grandmother; Colon cancer (age of onset: 68) in her father; Heart attack (age of onset: 80) in her maternal  grandmother; Heart attack (age of onset: 53) in her maternal aunt; Heart disease in her maternal grandmother; Pancreatic cancer (age of onset: 10) in her mother; Thyroid disease in her sister and sister.She reports that she quit smoking about 2 years ago. Her smoking use included Cigarettes. She has a 84 pack-year smoking history. She has never used smokeless tobacco. She reports that she drinks alcohol. She reports that she does not use illicit drugs.  Current Outpatient Prescriptions on File Prior to Visit  Medication Sig Dispense Refill  . ASTAXANTHIN PO Take 1 tablet by mouth daily.      Marland Kitchen escitalopram (LEXAPRO) 20 MG tablet Take 1 tablet by mouth  daily 90 tablet 3  . fexofenadine (ALLEGRA) 180 MG tablet Take 180 mg by mouth daily.      Marland Kitchen levothyroxine (SYNTHROID, LEVOTHROID) 100 MCG tablet Take 1 tablet (100 mcg total) by mouth daily before breakfast. Repeat labs are due now 90 tablet 3  . loperamide (IMODIUM A-D) 2 MG tablet 1 po bid 180 tablet 3  . multivitamin (THERAGRAN) per tablet Take 1 tablet by mouth daily.      . simvastatin (ZOCOR) 40 MG tablet Take 1 tablet by mouth  every evening 90 tablet 3  . Ubiquinol 100 MG CAPS Take by mouth.     No current facility-administered medications on file prior to visit.     Objective:  Objective Physical Exam  Constitutional: She is oriented to person, place, and time. She appears well-developed and well-nourished.  HENT:  Head: Normocephalic and atraumatic.  Eyes: Conjunctivae and EOM are normal.  Neck: Normal range of motion. Neck supple. No JVD present. Carotid bruit is not present. No thyromegaly present.  Cardiovascular: Normal rate, regular rhythm and normal heart sounds.   No murmur heard. Pulmonary/Chest: Effort normal and breath sounds normal. No respiratory distress. She has no wheezes. She has no rales. She exhibits no tenderness.  Musculoskeletal: She exhibits no edema.  Neurological: She is alert and oriented to person,  place, and time. She displays normal reflexes. Coordination normal.  Psychiatric: She has a normal mood and affect. Her behavior is normal.   BP 120/76 mmHg  Pulse 74  Temp(Src) 98.8 F (37.1 C) (Oral)  Ht 5\' 3"  (1.6 m)  Wt 251 lb (113.853 kg)  BMI 44.47 kg/m2  SpO2 96% Wt Readings from Last 3 Encounters:  01/23/15 251 lb (113.853 kg)  09/19/14 266 lb 6.4 oz (120.838 kg)  02/24/14 262 lb (118.842 kg)     Lab Results  Component Value Date   WBC 4.1 09/08/2014   HGB 14.0 09/08/2014   HCT 41.6 09/08/2014   PLT 176.0 09/08/2014   GLUCOSE 92 12/23/2014   CHOL 134 12/23/2014   TRIG 65.0 12/23/2014   HDL 64.80 12/23/2014   LDLDIRECT 129.1 06/13/2011   LDLCALC 56 12/23/2014   ALT 15 12/23/2014   AST 21 12/23/2014   NA 139 12/23/2014   K 3.8 12/23/2014   CL 104 12/23/2014   CREATININE 0.64 12/23/2014   BUN 13 12/23/2014   CO2 28 12/23/2014   TSH 3.491 11/21/2014   HGBA1C 5.5 01/23/2015   MICROALBUR 0.2 02/01/2013    Mm Digital Screening Bilateral  05/11/2014   CLINICAL DATA:  Screening.  EXAM: DIGITAL SCREENING BILATERAL MAMMOGRAM WITH CAD  COMPARISON:  Previous exam(s)  ACR Breast Density Category a: The breast tissue is almost entirely fatty.  FINDINGS: There are no findings suspicious for malignancy. Images were processed with CAD.  IMPRESSION: No mammographic evidence of malignancy. A result letter of this screening mammogram will be mailed directly to the patient.  RECOMMENDATION: Screening mammogram in one year. (Code:[id number]-01Y)  BI-RADS CATEGORY  1: Negative.   Electronically Signed   By: Lajean Manes M.D.   On: 05/11/2014 10:19     Assessment & Plan:  Plan I am having Ms. Kimm start on aspirin EC. I am also having her maintain her fexofenadine, multivitamin, ASTAXANTHIN PO, Ubiquinol, loperamide, levothyroxine, escitalopram, simvastatin, ibuprofen, cholecalciferol, and ALPRAZolam.  Meds ordered this encounter  Medications  . ibuprofen (ADVIL,MOTRIN) 200 MG tablet     Sig: Take 200 mg by mouth 4 (four) times daily as needed.  . cholecalciferol (VITAMIN D) 1000 UNITS tablet    Sig: Take 1,000 Units by mouth daily.  Marland Kitchen aspirin EC 81 MG tablet    Sig: Take 1 tablet (81 mg total) by mouth daily.  Marland Kitchen ALPRAZolam (XANAX) 0.25 MG tablet    Sig: Take 1 tablet (0.25 mg total) by mouth 3 (three) times daily as needed for anxiety.    Dispense:  30 tablet    Refill:  0    Problem List Items Addressed This Visit    TRANSIENT GLOBAL AMNESIA - Primary    Pt worried it is occurring again-- she is requesting a neuro referral       Relevant Orders   Ambulatory referral to Neurology   Preventative health care   Relevant Orders   Nicotine/cotinine metabolites   Hemoglobin A1c (Completed)   Hyperlipidemia  Pt wants to check labs in 3 months off zocor Pt aware of risk heart attack and stroke but still wants to stop it      Relevant Medications   aspirin EC 81 MG tablet   Other Relevant Orders   Hepatic function panel   Lipid panel    Other Visit Diagnoses    Anxiety        Relevant Medications    ALPRAZolam (XANAX) 0.25 MG tablet    History of smoking 30 or more pack years        Relevant Orders    Ambulatory Referral for Lung Cancer Scre       Follow-up: Return in about 3 months (around 04/25/2015) for labs, hyperlipidemia.  Garnet Koyanagi, DO

## 2015-01-23 NOTE — Assessment & Plan Note (Signed)
Pt worried it is occurring again-- she is requesting a neuro referral

## 2015-01-23 NOTE — Assessment & Plan Note (Signed)
Pt wants to check labs in 3 months off zocor Pt aware of risk heart attack and stroke but still wants to stop it

## 2015-01-23 NOTE — Patient Instructions (Signed)
Transient Global Amnesia °Your exam shows you may have a rare problem that causes temporary amnesia, an inability to remember what has happened in the past several hours or day. Transient global amnesia (TGA) means you cannot remember recent events, even though you may look and act normally. There are no physical problems in TGA; your vision, strength, coordination, and sensations are all normal. TGA occurs most often in older patients, and in patients with high blood pressure. The exact cause of TGA is not known, although it is thought to be due to vascular disease in your brain. °There is usually a complete return to normal memory capacity after an episode is over. About 20-30% of patients with TGA will have more than one episode, and some studies show a slight increased risk for stroke. Although no special treatment is needed, taking up to one adult aspirin daily reduces the risk of having a stroke. You should consider taking aspirin daily if you are not allergic to it. Medical evaluation may require specialized scans to check for stroke or other brain problems, an EEG (brain wave test), or blood tests. °Avoid alcohol or any sedating medicines until you are completely recovered. Call your doctor right away if your memory is not fully recovered after 24 hours, or if you have any other serious problems including: °· Severe headache, nausea, vomiting, fever, or other symptoms of an infection. °· Weakness, numbness, difficulty with movement, or incoordination. °· Blurred or double vision, unusual sleepiness, seizures, or fainting. °Document Released: 08/08/2004 Document Revised: 09/23/2011 Document Reviewed: 07/01/2005 °[employer]® Patient Information ©2015 [employer]. This information is not intended to replace advice given to you by your health care provider. Make sure you discuss any questions you have with your health care provider. ° °

## 2015-01-23 NOTE — Progress Notes (Signed)
Pre visit review using our clinic review tool, if applicable. No additional management support is needed unless otherwise documented below in the visit note. 

## 2015-01-24 ENCOUNTER — Other Ambulatory Visit: Payer: Self-pay | Admitting: Acute Care

## 2015-01-24 DIAGNOSIS — Z87891 Personal history of nicotine dependence: Secondary | ICD-10-CM

## 2015-01-24 LAB — NICOTINE/COTININE METABOLITES

## 2015-01-26 ENCOUNTER — Ambulatory Visit (INDEPENDENT_AMBULATORY_CARE_PROVIDER_SITE_OTHER): Payer: 59 | Admitting: Acute Care

## 2015-01-26 ENCOUNTER — Ambulatory Visit (HOSPITAL_BASED_OUTPATIENT_CLINIC_OR_DEPARTMENT_OTHER)
Admission: RE | Admit: 2015-01-26 | Discharge: 2015-01-26 | Disposition: A | Discharge status: Home / Self Care | Payer: 59 | Source: Ambulatory Visit | Attending: Acute Care | Admitting: Acute Care

## 2015-01-26 ENCOUNTER — Encounter: Payer: Self-pay | Admitting: Acute Care

## 2015-01-26 DIAGNOSIS — Z122 Encounter for screening for malignant neoplasm of respiratory organs: Secondary | ICD-10-CM | POA: Diagnosis not present

## 2015-01-26 DIAGNOSIS — Z87891 Personal history of nicotine dependence: Secondary | ICD-10-CM | POA: Insufficient documentation

## 2015-01-26 NOTE — Progress Notes (Signed)
Shared Decision Making Visit Lung Cancer Screening Program [phone])   Eligibility:  Age 68 y.o.  Pack Years Smoking History Calculation :36 (# packs/per year x # years smoked)  Recent History of coughing up blood  no  Unexplained weight loss? no ( >Than 15 pounds within the last 6 months )  Prior History Lung / other cancer no (Diagnosis within the last 5 years already requiring surveillance chest CT Scans).  Smoking Status Former Smoker  Former Smokers: Years since quit: 2 years  Quit Date: 07/28/2012  Visit Components:  Discussion included one or more decision making aids. yes  Discussion included risk/benefits of screening. yes  Discussion included potential follow up diagnostic testing for abnormal scans. yes  Discussion included meaning and risk of over diagnosis. yes  Discussion included meaning and risk of False Positives. yes  Discussion included meaning of total radiation exposure. yes  Counseling Included:  Importance of adherence to annual lung cancer LDCT screening. yes  Impact of comorbidities on ability to participate in the program. yes  Ability and willingness to under diagnostic treatment. yes  Smoking Cessation Counseling:  Current Smokers:   Discussed importance of smoking cessation. NA  Information about tobacco cessation classes and interventions provided to patient.Not a current smoker  Patient provided with "ticket" for LDCT Scan.   Symptomatic Patient. no  Counseling:NA  Diagnosis Code: Tobacco Use Z72.0  Asymptomatic Patient yes  Counseling: Reminded to remain smoke free  Former Smokers:   Discussed the importance of maintaining cigarette abstinence. yes  Diagnosis Code: Personal History of Nicotine Dependence. A07.622  Information about tobacco cessation classes and interventions provided to patient. NA, former smoker  Patient provided with "ticket" for LDCT Scan. yes  Written Order for Lung Cancer Screening with LDCT  placed in Epic. Yes (CT Chest Lung Cancer Screening Low Dose W/O CM) [id number] Z12.2-Screening of respiratory organs Z87.891-Personal history of nicotine dependence   I spend 20 minutes of face to face time with Mrs. Washington explaining the risks and benefits of the lung cancer screening program. We went though a power point together , and discussed all of the above indicated topics, allowing for questions to be asked and answered.We discussed that the single most powerful thing she can do to decrease her risk of lung cancer is to remain smoke free. Ms. Bergh has no intention of resuming smoking, and was successful in quitting 2 years ago with the help of [facility]. Take- aways from todays appointment included a copy of the power point we reviewed together, and my card with my contact information.She verbalized understanding. Ms. Fleet had no further questions or concerns about the screening program. She verbalized understanding of location and time of her scan appointment, and that I will call her with the results.She has my contact information in the event she has any questions.   Magdalen Spatz, NP

## 2015-01-27 ENCOUNTER — Telehealth: Payer: Self-pay | Admitting: Acute Care

## 2015-01-27 NOTE — Telephone Encounter (Signed)
I called [name] [name] this morning to give her the LDCT scan results. She was not home, and I left a message asking that she return my call for the results. She has just called back and I told her that her scan resulted as a Lung RADS 2 ( nodules that are benign in appearance with very low likelihood of becoming clinically active cancer due to lack of size or growth). She verbalized understanding.I told her the recommendation is for a repeat scan in 12 months,and that I will call to schedule her. I also told her that if there is a change in her health, and if she starts coughing up blood or has an unexplained weight loss of 15 pounds or more over a 6 month period that she should follow up with Dr. Etter Sjogren, or the lung cancer program immediately. She verbalized understanding of all of the above.

## 2015-01-28 ENCOUNTER — Encounter: Payer: Self-pay | Admitting: Family Medicine

## 2015-03-02 ENCOUNTER — Encounter: Payer: Self-pay | Admitting: Family Medicine

## 2015-03-07 ENCOUNTER — Ambulatory Visit: Payer: 59 | Admitting: Neurology

## 2015-04-07 ENCOUNTER — Encounter: Payer: Self-pay | Admitting: Family Medicine

## 2015-04-07 ENCOUNTER — Other Ambulatory Visit: Payer: Self-pay | Admitting: Family Medicine

## 2015-04-07 DIAGNOSIS — E785 Hyperlipidemia, unspecified: Secondary | ICD-10-CM

## 2015-04-07 DIAGNOSIS — Z1159 Encounter for screening for other viral diseases: Secondary | ICD-10-CM

## 2015-04-10 ENCOUNTER — Other Ambulatory Visit (HOSPITAL_BASED_OUTPATIENT_CLINIC_OR_DEPARTMENT_OTHER): Payer: Self-pay | Admitting: Obstetrics and Gynecology

## 2015-04-10 DIAGNOSIS — Z1231 Encounter for screening mammogram for malignant neoplasm of breast: Secondary | ICD-10-CM

## 2015-04-20 ENCOUNTER — Other Ambulatory Visit (INDEPENDENT_AMBULATORY_CARE_PROVIDER_SITE_OTHER): Payer: 59

## 2015-04-20 DIAGNOSIS — E785 Hyperlipidemia, unspecified: Secondary | ICD-10-CM

## 2015-04-20 DIAGNOSIS — Z1159 Encounter for screening for other viral diseases: Secondary | ICD-10-CM

## 2015-04-20 LAB — COMPREHENSIVE METABOLIC PANEL
ALBUMIN: 3.9 g/dL (ref 3.5–5.2)
ALT: 15 U/L (ref 0–35)
AST: 20 U/L (ref 0–37)
Alkaline Phosphatase: 63 U/L (ref 39–117)
BUN: 14 mg/dL (ref 6–23)
CHLORIDE: 105 meq/L (ref 96–112)
CO2: 30 meq/L (ref 19–32)
CREATININE: 0.61 mg/dL (ref 0.40–1.20)
Calcium: 9.1 mg/dL (ref 8.4–10.5)
GFR: 104.01 mL/min (ref >60.00)
Glucose, Bld: 96 mg/dL (ref 70–99)
POTASSIUM: 4.2 meq/L (ref 3.5–5.1)
Sodium: 141 mEq/L (ref 135–145)
Total Bilirubin: 0.4 mg/dL (ref 0.2–1.2)
Total Protein: 6.3 g/dL (ref 6.0–8.3)

## 2015-04-20 LAB — LIPID PANEL
CHOL/HDL RATIO: 3
CHOLESTEROL: 208 mg/dL — AB (ref 0–200)
HDL: 67.8 mg/dL (ref >39.00)
LDL Cholesterol: 128 mg/dL — ABNORMAL HIGH (ref 0–99)
NonHDL: 140.57
TRIGLYCERIDES: 64 mg/dL (ref 0.0–149.0)
VLDL: 12.8 mg/dL (ref 0.0–40.0)

## 2015-04-20 NOTE — Addendum Note (Signed)
Addended by: Harl Bowie on: 04/20/2015 02:12 PM   Modules accepted: Orders

## 2015-04-21 LAB — HEPATITIS C ANTIBODY: HCV AB: NEGATIVE

## 2015-04-28 ENCOUNTER — Encounter: Payer: Self-pay | Admitting: Family Medicine

## 2015-04-28 ENCOUNTER — Ambulatory Visit (INDEPENDENT_AMBULATORY_CARE_PROVIDER_SITE_OTHER): Payer: 59 | Admitting: Family Medicine

## 2015-04-28 VITALS — BP 114/62 | HR 76 | Temp 98.2°F | Wt 240.0 lb

## 2015-04-28 DIAGNOSIS — Z23 Encounter for immunization: Secondary | ICD-10-CM

## 2015-04-28 DIAGNOSIS — F419 Anxiety disorder, unspecified: Secondary | ICD-10-CM

## 2015-04-28 DIAGNOSIS — E039 Hypothyroidism, unspecified: Secondary | ICD-10-CM

## 2015-04-28 DIAGNOSIS — J302 Other seasonal allergic rhinitis: Secondary | ICD-10-CM

## 2015-04-28 DIAGNOSIS — E785 Hyperlipidemia, unspecified: Secondary | ICD-10-CM

## 2015-04-28 MED ORDER — ALPRAZOLAM 0.25 MG PO TABS: 0.2500 mg | ORAL_TABLET | Freq: Three times a day (TID) | ORAL | Status: DC | PRN

## 2015-04-28 MED ORDER — LEVOCETIRIZINE DIHYDROCHLORIDE 5 MG PO TABS: 5.0000 mg | ORAL_TABLET | Freq: Every evening | ORAL | Status: DC

## 2015-04-28 NOTE — Progress Notes (Signed)
Patient ID: [name] [name], female    DOB: [birth year]  Age: 69 y.o. MRN: [id number]    Subjective:  Subjective HPI [name] [name] presents for f/u thyroid and has a mole she wants me to see.   Review of Systems  Constitutional: Negative for diaphoresis, appetite change, fatigue and unexpected weight change.  Eyes: Negative for pain, redness and visual disturbance.  Respiratory: Negative for cough, chest tightness, shortness of breath and wheezing.   Cardiovascular: Negative for chest pain, palpitations and leg swelling.  Endocrine: Negative for cold intolerance, heat intolerance, polydipsia, polyphagia and polyuria.  Genitourinary: Negative for dysuria, frequency and difficulty urinating.  Neurological: Negative for dizziness, light-headedness, numbness and headaches.    History Past Medical History  Diagnosis Date  . Depression   . Allergic rhinitis   . Hyperlipidemia   . Hyperlipidemia     on simvastatin  . Seasonal allergies   . Arthritis   . Hypothyroidism   . Endometrial polyp   . 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   Her family history includes Breast cancer (age of onset: 21) in her maternal aunt; Cancer (age of onset: 34) in her father; Cancer (age of onset: 21) in her brother and paternal grandmother; Cancer (age of onset: 70) in her sister; Cancer (age of onset: 15) in her mother; Colon cancer in her paternal grandmother; Colon cancer (age of onset: 31) in her father; Heart attack (age of onset: 78) in her maternal grandmother; Heart attack (age of onset: 40) in her maternal aunt; Heart disease in her maternal grandmother; Pancreatic cancer (age of onset: 71) in her mother; Thyroid disease in her sister and sister.She reports that she quit smoking about 2 years ago. Her smoking use included  Cigarettes. She has a 84 pack-year smoking history. She has never used smokeless tobacco. She reports that she drinks alcohol. She reports that she does not use illicit drugs.  Current Outpatient Prescriptions on File Prior to Visit  Medication Sig Dispense Refill  . aspirin EC 81 MG tablet Take 1 tablet (81 mg total) by mouth daily.    . ASTAXANTHIN PO Take 1 tablet by mouth daily.      . cholecalciferol (VITAMIN D) 1000 UNITS tablet Take 1,000 Units by mouth daily.    Marland Kitchen escitalopram (LEXAPRO) 20 MG tablet Take 1 tablet by mouth  daily 90 tablet 3  . ibuprofen (ADVIL,MOTRIN) 200 MG tablet Take 200 mg by mouth 4 (four) times daily as needed.    Marland Kitchen levothyroxine (SYNTHROID, LEVOTHROID) 100 MCG tablet Take 1 tablet (100 mcg total) by mouth daily before breakfast. Repeat labs are due now 90 tablet 3  . loperamide (IMODIUM A-D) 2 MG tablet 1 po bid 180 tablet 3  . multivitamin (THERAGRAN) per tablet Take 1 tablet by mouth daily.      . simvastatin (ZOCOR) 40 MG tablet Take 1 tablet by mouth  every evening 90 tablet 3  . Ubiquinol 100 MG CAPS Take by mouth.     No current facility-administered medications on file prior to visit.     Objective:  Objective Physical Exam  Constitutional: She is oriented to person, place, and time. She appears well-developed and well-nourished.  HENT:  Head: Normocephalic and atraumatic.  Eyes: Conjunctivae and EOM are normal.  Neck: Normal range of motion. Neck supple. No JVD present. Carotid bruit is not  present. No thyromegaly present.  Cardiovascular: Normal rate, regular rhythm and normal heart sounds.   No murmur heard. Pulmonary/Chest: Effort normal and breath sounds normal. No respiratory distress. She has no wheezes. She has no rales. She exhibits no tenderness.  Musculoskeletal: She exhibits no edema.  Neurological: She is alert and oriented to person, place, and time.  Skin: Skin is warm. No abrasion, no lesion and no rash noted.  Psychiatric: She  has a normal mood and affect. Her behavior is normal.  Nursing note and vitals reviewed.  BP 114/62 mmHg  Pulse 76  Temp(Src) 98.2 F (36.8 C) (Oral)  Wt 240 lb (108.863 kg)  SpO2 97% Wt Readings from Last 3 Encounters:  04/28/15 240 lb (108.863 kg)  01/23/15 251 lb (113.853 kg)  09/19/14 266 lb 6.4 oz (120.838 kg)     Lab Results  Component Value Date   WBC 4.1 09/08/2014   HGB 14.0 09/08/2014   HCT 41.6 09/08/2014   PLT 176.0 09/08/2014   GLUCOSE 96 04/20/2015   CHOL 208* 04/20/2015   TRIG 64.0 04/20/2015   HDL 67.80 04/20/2015   LDLDIRECT 129.1 06/13/2011   LDLCALC 128* 04/20/2015   ALT 15 04/20/2015   AST 20 04/20/2015   NA 141 04/20/2015   K 4.2 04/20/2015   CL 105 04/20/2015   CREATININE 0.61 04/20/2015   BUN 14 04/20/2015   CO2 30 04/20/2015   TSH 3.491 11/21/2014   HGBA1C 5.5 01/23/2015   MICROALBUR 0.2 02/01/2013    Ct Chest Lung Ca Screen Low Dose W/o Cm  01/26/2015  CLINICAL DATA:  69 year old former smoker. Asymptomatic. Eighty-four pack-year history. EXAM: CT CHEST WITHOUT CONTRAST TECHNIQUE: Multidetector CT imaging of the chest was performed following the standard protocol without IV contrast. COMPARISON:  None. FINDINGS: Mediastinum: Heart size is normal. There is no pericardial effusion. The trachea appears patent and is midline. Normal appearance of the esophagus. No axillary or supraclavicular adenopathy. Lungs/Pleura: No pleural effusions. No airspace consolidation or atelectasis identified. There are 2 small nodules identified in the right lung. The largest is in the right lower lobe, image 163/series 3. This has an equivalent diameter of 4.5 mm. Upper Abdomen: The visualized portions of the liver are within normal limits. Normal appearance of the spleen. The adrenal glands are unremarkable. 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 There is no aggressive lytic or sclerotic bone lesions. IMPRESSION: 1. Lung-RADS  Category 2, benign appearance or behavior. Continue annual screening with low-dose chest CT without contrast in 12 months Electronically Signed   By: Kerby Moors M.D.   On: 01/26/2015 17:16     Assessment & Plan:  Plan I have discontinued Ms. Mages's fexofenadine. I am also having her start on levocetirizine. Additionally, I am having her maintain her multivitamin, ASTAXANTHIN PO, Ubiquinol, loperamide, levothyroxine, escitalopram, simvastatin, ibuprofen, cholecalciferol, aspirin EC, and ALPRAZolam.  Meds ordered this encounter  Medications  . levocetirizine (XYZAL) 5 MG tablet    Sig: Take 1 tablet (5 mg total) by mouth every evening.    Dispense:  30 tablet    Refill:  11  . ALPRAZolam (XANAX) 0.25 MG tablet    Sig: Take 1 tablet (0.25 mg total) by mouth 3 (three) times daily as needed for anxiety.    Dispense:  30 tablet    Refill:  0    Problem List Items Addressed This Visit    Hypothyroidism    con't synthroid Check labs  Hyperlipidemia - Primary    Other Visit Diagnoses    Seasonal allergies        Relevant Medications    levocetirizine (XYZAL) 5 MG tablet    Anxiety        Relevant Medications    ALPRAZolam (XANAX) 0.25 MG tablet    Need for immunization against influenza        Relevant Orders    Flu Vaccine QUAD 36+ mos IM (Fluarix) (Completed)       Follow-up: Return in about 6 months (around 10/27/2015), or if symptoms worsen or fail to improve, for hypertension, hyperlipidemia.  Garnet Koyanagi, DO

## 2015-04-28 NOTE — Patient Instructions (Signed)

## 2015-04-28 NOTE — Progress Notes (Signed)
Pre visit review using our clinic review tool, if applicable. No additional management support is needed unless otherwise documented below in the visit note. 

## 2015-04-30 NOTE — Assessment & Plan Note (Signed)
con't synthroid Check labs  

## 2015-05-16 ENCOUNTER — Ambulatory Visit (HOSPITAL_BASED_OUTPATIENT_CLINIC_OR_DEPARTMENT_OTHER)
Admission: RE | Admit: 2015-05-16 | Discharge: 2015-05-16 | Disposition: A | Discharge status: Home / Self Care | Payer: 59 | Source: Ambulatory Visit | Attending: Obstetrics and Gynecology | Admitting: Obstetrics and Gynecology

## 2015-05-16 DIAGNOSIS — Z1231 Encounter for screening mammogram for malignant neoplasm of breast: Secondary | ICD-10-CM

## 2015-06-19 ENCOUNTER — Encounter: Payer: Self-pay | Admitting: Family Medicine

## 2015-06-20 MED ORDER — LEVOTHYROXINE SODIUM 25 MCG PO TABS: 25.0000 ug | ORAL_TABLET | Freq: Every day | ORAL | Status: DC

## 2015-06-20 MED ORDER — LEVOTHYROXINE SODIUM 75 MCG PO TABS: 75.0000 ug | ORAL_TABLET | Freq: Every day | ORAL | Status: DC

## 2015-07-10 ENCOUNTER — Encounter: Payer: Self-pay | Admitting: Family Medicine

## 2015-07-11 NOTE — Telephone Encounter (Signed)
Ok to give 0.25

## 2015-08-31 ENCOUNTER — Encounter: Payer: Self-pay | Admitting: Acute Care

## 2015-09-16 ENCOUNTER — Other Ambulatory Visit: Payer: Self-pay | Admitting: Family

## 2015-09-18 NOTE — Telephone Encounter (Signed)
Last abnormal lab says she should be taking 100 mcg

## 2015-09-18 NOTE — Telephone Encounter (Signed)
Pt has rx for levothyroxine 75 mcg and 25 mcg. Please advise correct dosage for refill.

## 2015-09-19 ENCOUNTER — Other Ambulatory Visit: Payer: Self-pay | Admitting: Family Medicine

## 2015-09-19 NOTE — Telephone Encounter (Signed)
Pharmacy requesting verbal orders

## 2015-09-19 NOTE — Telephone Encounter (Signed)
Spoke with the patient and she stated that she had an RX for 75 mcg, and she had 2 90 day supplies and did not want to waste them so someone here said it was ok to take the 25 mcg. She said she had 87 pills left and would like to get a script for 87 of the 25 mcg. Rx faxed and she has been scheduled for May to have a follow up and recheck of labs in May.     KP

## 2015-10-03 ENCOUNTER — Other Ambulatory Visit: Payer: Self-pay | Admitting: Acute Care

## 2015-10-03 DIAGNOSIS — Z87891 Personal history of nicotine dependence: Secondary | ICD-10-CM

## 2015-10-16 ENCOUNTER — Other Ambulatory Visit: Payer: Self-pay | Admitting: Family Medicine

## 2015-11-01 ENCOUNTER — Encounter: Payer: Self-pay | Admitting: Family Medicine

## 2015-11-02 ENCOUNTER — Other Ambulatory Visit: Payer: Self-pay | Admitting: Family Medicine

## 2015-11-02 DIAGNOSIS — Z0000 Encounter for general adult medical examination without abnormal findings: Secondary | ICD-10-CM

## 2015-11-02 DIAGNOSIS — E785 Hyperlipidemia, unspecified: Secondary | ICD-10-CM

## 2015-11-02 DIAGNOSIS — E039 Hypothyroidism, unspecified: Secondary | ICD-10-CM

## 2015-11-02 NOTE — Telephone Encounter (Signed)
She just needs a lab appointment---order is in

## 2015-11-04 ENCOUNTER — Encounter: Payer: Self-pay | Admitting: Family Medicine

## 2015-11-06 ENCOUNTER — Other Ambulatory Visit: Payer: Self-pay | Admitting: Family Medicine

## 2015-11-06 DIAGNOSIS — Z1159 Encounter for screening for other viral diseases: Secondary | ICD-10-CM

## 2015-11-06 DIAGNOSIS — Z8673 Personal history of transient ischemic attack (TIA), and cerebral infarction without residual deficits: Secondary | ICD-10-CM

## 2015-11-06 DIAGNOSIS — E039 Hypothyroidism, unspecified: Secondary | ICD-10-CM

## 2015-11-06 DIAGNOSIS — E785 Hyperlipidemia, unspecified: Secondary | ICD-10-CM

## 2015-11-06 NOTE — Telephone Encounter (Signed)
If its for a physical I'll put physical labs in

## 2015-11-08 ENCOUNTER — Other Ambulatory Visit (INDEPENDENT_AMBULATORY_CARE_PROVIDER_SITE_OTHER): Payer: 59

## 2015-11-08 DIAGNOSIS — Z8673 Personal history of transient ischemic attack (TIA), and cerebral infarction without residual deficits: Secondary | ICD-10-CM

## 2015-11-08 DIAGNOSIS — E785 Hyperlipidemia, unspecified: Secondary | ICD-10-CM | POA: Diagnosis not present

## 2015-11-08 DIAGNOSIS — Z1159 Encounter for screening for other viral diseases: Secondary | ICD-10-CM

## 2015-11-08 DIAGNOSIS — E039 Hypothyroidism, unspecified: Secondary | ICD-10-CM

## 2015-11-08 LAB — LIPID PANEL
CHOLESTEROL: 225 mg/dL — AB (ref 0–200)
HDL: 69.7 mg/dL (ref >39.00)
LDL CALC: 143 mg/dL — AB (ref 0–99)
NONHDL: 154.9
Total CHOL/HDL Ratio: 3
Triglycerides: 62 mg/dL (ref 0.0–149.0)
VLDL: 12.4 mg/dL (ref 0.0–40.0)

## 2015-11-08 LAB — CBC WITH DIFFERENTIAL/PLATELET
BASOS PCT: 0.4 % (ref 0.0–3.0)
Basophils Absolute: 0 10*3/uL (ref 0.0–0.1)
EOS PCT: 2.5 % (ref 0.0–5.0)
Eosinophils Absolute: 0.1 10*3/uL (ref 0.0–0.7)
HCT: 40.5 % (ref 36.0–46.0)
HEMOGLOBIN: 13.4 g/dL (ref 12.0–15.0)
LYMPHS ABS: 1.1 10*3/uL (ref 0.7–4.0)
Lymphocytes Relative: 27.5 % (ref 12.0–46.0)
MCHC: 33.2 g/dL (ref 30.0–36.0)
MCV: 90.7 fl (ref 78.0–100.0)
Monocytes Absolute: 0.3 10*3/uL (ref 0.1–1.0)
Monocytes Relative: 8.1 % (ref 3.0–12.0)
NEUTROS PCT: 61.5 % (ref 43.0–77.0)
Neutro Abs: 2.3 10*3/uL (ref 1.4–7.7)
Platelets: 185 10*3/uL (ref 150.0–400.0)
RBC: 4.47 Mil/uL (ref 3.87–5.11)
RDW: 14.5 % (ref 11.5–15.5)
WBC: 3.8 10*3/uL — ABNORMAL LOW (ref 4.0–10.5)

## 2015-11-08 LAB — COMPREHENSIVE METABOLIC PANEL
ALT: 17 U/L (ref 0–35)
AST: 20 U/L (ref 0–37)
Albumin: 4 g/dL (ref 3.5–5.2)
Alkaline Phosphatase: 58 U/L (ref 39–117)
BUN: 19 mg/dL (ref 6–23)
CHLORIDE: 104 meq/L (ref 96–112)
CO2: 30 mEq/L (ref 19–32)
Calcium: 9 mg/dL (ref 8.4–10.5)
Creatinine, Ser: 0.54 mg/dL (ref 0.40–1.20)
GFR: 119.52 mL/min (ref >60.00)
GLUCOSE: 91 mg/dL (ref 70–99)
POTASSIUM: 4.3 meq/L (ref 3.5–5.1)
Sodium: 139 mEq/L (ref 135–145)
Total Bilirubin: 0.4 mg/dL (ref 0.2–1.2)
Total Protein: 6.4 g/dL (ref 6.0–8.3)

## 2015-11-08 LAB — TSH: TSH: 3.09 u[IU]/mL (ref 0.35–4.50)

## 2015-11-09 LAB — HEPATITIS C ANTIBODY: HCV Ab: NEGATIVE

## 2015-11-13 ENCOUNTER — Other Ambulatory Visit: Payer: Self-pay | Admitting: Family Medicine

## 2015-11-21 ENCOUNTER — Ambulatory Visit (INDEPENDENT_AMBULATORY_CARE_PROVIDER_SITE_OTHER): Payer: 59 | Admitting: Family Medicine

## 2015-11-21 ENCOUNTER — Encounter: Payer: Self-pay | Admitting: Family Medicine

## 2015-11-21 VITALS — BP 120/68 | HR 69 | Temp 98.6°F | Ht 63.0 in | Wt 241.0 lb

## 2015-11-21 DIAGNOSIS — E039 Hypothyroidism, unspecified: Secondary | ICD-10-CM | POA: Diagnosis not present

## 2015-11-21 DIAGNOSIS — E785 Hyperlipidemia, unspecified: Secondary | ICD-10-CM | POA: Diagnosis not present

## 2015-11-21 NOTE — Patient Instructions (Signed)

## 2015-11-21 NOTE — Progress Notes (Signed)
Pre visit review using our clinic review tool, if applicable. No additional management support is needed unless otherwise documented below in the visit note. 

## 2015-11-21 NOTE — Progress Notes (Signed)
Subjective:    Patient ID: [name] [name], female    DOB: 1945-12-12, 70 y.o.   MRN: [id number]  HPI  Patient here for f/u thyroid and cholesterol.  Labs done last week.   Past Medical History  Diagnosis Date  . Depression   . Allergic rhinitis   . Hyperlipidemia   . Hyperlipidemia     on simvastatin  . Seasonal allergies   . Arthritis   . Hypothyroidism   . Endometrial polyp   . Obesity   . Cataract     Review of Systems  Constitutional: Negative for diaphoresis, appetite change, fatigue and unexpected weight change.  Eyes: Negative for pain, redness and visual disturbance.  Respiratory: Negative for cough, chest tightness, shortness of breath and wheezing.   Cardiovascular: Negative for chest pain, palpitations and leg swelling.  Endocrine: Negative for cold intolerance, heat intolerance, polydipsia, polyphagia and polyuria.  Genitourinary: Negative for dysuria, frequency and difficulty urinating.  Neurological: Negative for dizziness, light-headedness, numbness and headaches.       Objective:    Physical Exam  Constitutional: She is oriented to person, place, and time. She appears well-developed and well-nourished.  HENT:  Head: Normocephalic and atraumatic.  Eyes: Conjunctivae and EOM are normal.  Neck: Normal range of motion. Neck supple. No JVD present. Carotid bruit is not present. No thyromegaly present.  Cardiovascular: Normal rate, regular rhythm and normal heart sounds.   No murmur heard. Pulmonary/Chest: Effort normal and breath sounds normal. No respiratory distress. She has no wheezes. She has no rales. She exhibits no tenderness.  Musculoskeletal: She exhibits no edema.  Neurological: She is alert and oriented to person, place, and time.  Psychiatric: She has a normal mood and affect. Her behavior is normal.    BP 120/68 mmHg  Pulse 69  Temp(Src) 98.6 F (37 C) (Oral)  Ht 5\' 3"  (1.6 m)  Wt 241 lb (109.317 kg)  BMI 42.70 kg/m2  SpO2 98% Wt  Readings from Last 3 Encounters:  11/21/15 241 lb (109.317 kg)  04/28/15 240 lb (108.863 kg)  01/23/15 251 lb (113.853 kg)     Lab Results  Component Value Date   WBC 3.8* 11/08/2015   HGB 13.4 11/08/2015   HCT 40.5 11/08/2015   PLT 185.0 11/08/2015   GLUCOSE 91 11/08/2015   CHOL 225* 11/08/2015   TRIG 62.0 11/08/2015   HDL 69.70 11/08/2015   LDLDIRECT 129.1 06/13/2011   LDLCALC 143* 11/08/2015   ALT 17 11/08/2015   AST 20 11/08/2015   NA 139 11/08/2015   K 4.3 11/08/2015   CL 104 11/08/2015   CREATININE 0.54 11/08/2015   BUN 19 11/08/2015   CO2 30 11/08/2015   TSH 3.09 11/08/2015   HGBA1C 5.5 01/23/2015   MICROALBUR 0.2 02/01/2013    Mm Digital Screening Bilateral  05/18/2015  CLINICAL DATA:  Screening. EXAM: DIGITAL SCREENING BILATERAL MAMMOGRAM WITH CAD COMPARISON:  Previous exam(s). ACR Breast Density Category b: There are scattered areas of fibroglandular density. FINDINGS: There are no findings suspicious for malignancy. Images were processed with CAD. IMPRESSION: No mammographic evidence of malignancy. A result letter of this screening mammogram will be mailed directly to the patient. RECOMMENDATION: Screening mammogram in one year. (Code:SM-B-01Y) BI-RADS CATEGORY  1: Negative. Electronically Signed   By: Fidela Salisbury M.D.   On: 05/18/2015 10:25       Assessment & Plan:   Problem List Items Addressed This Visit      Unprioritized   Hyperlipidemia - Primary  Relevant Orders   Comprehensive metabolic panel   Lipid panel    Other Visit Diagnoses    Hypothyroidism, unspecified hypothyroidism type          Lab Results  Component Value Date   TSH 3.09 11/08/2015      [name], DO [phone]

## 2015-11-22 MED ORDER — LEVOTHYROXINE SODIUM 100 MCG PO TABS: ORAL_TABLET | ORAL | Status: DC

## 2016-01-04 ENCOUNTER — Other Ambulatory Visit: Payer: Self-pay | Admitting: Family Medicine

## 2016-01-30 ENCOUNTER — Ambulatory Visit (HOSPITAL_BASED_OUTPATIENT_CLINIC_OR_DEPARTMENT_OTHER): Payer: 59

## 2016-01-31 ENCOUNTER — Ambulatory Visit (HOSPITAL_BASED_OUTPATIENT_CLINIC_OR_DEPARTMENT_OTHER)
Admission: RE | Admit: 2016-01-31 | Discharge: 2016-01-31 | Disposition: A | Discharge status: Home / Self Care | Payer: 59 | Source: Ambulatory Visit | Attending: Acute Care | Admitting: Acute Care

## 2016-01-31 ENCOUNTER — Other Ambulatory Visit: Payer: Self-pay | Admitting: Acute Care

## 2016-01-31 DIAGNOSIS — Z87891 Personal history of nicotine dependence: Secondary | ICD-10-CM | POA: Diagnosis not present

## 2016-01-31 DIAGNOSIS — K8020 Calculus of gallbladder without cholecystitis without obstruction: Secondary | ICD-10-CM | POA: Diagnosis not present

## 2016-01-31 DIAGNOSIS — Z122 Encounter for screening for malignant neoplasm of respiratory organs: Secondary | ICD-10-CM | POA: Diagnosis present

## 2016-02-01 ENCOUNTER — Encounter: Payer: Self-pay | Admitting: Family Medicine

## 2016-02-01 ENCOUNTER — Other Ambulatory Visit: Payer: Self-pay | Admitting: Family Medicine

## 2016-02-01 DIAGNOSIS — E785 Hyperlipidemia, unspecified: Secondary | ICD-10-CM

## 2016-02-12 ENCOUNTER — Other Ambulatory Visit (INDEPENDENT_AMBULATORY_CARE_PROVIDER_SITE_OTHER): Payer: 59

## 2016-02-12 DIAGNOSIS — E785 Hyperlipidemia, unspecified: Secondary | ICD-10-CM | POA: Diagnosis not present

## 2016-02-12 LAB — COMPREHENSIVE METABOLIC PANEL
ALK PHOS: 66 U/L (ref 39–117)
ALT: 16 U/L (ref 0–35)
AST: 20 U/L (ref 0–37)
Albumin: 4.1 g/dL (ref 3.5–5.2)
BUN: 22 mg/dL (ref 6–23)
CO2: 27 mEq/L (ref 19–32)
Calcium: 9.2 mg/dL (ref 8.4–10.5)
Chloride: 105 mEq/L (ref 96–112)
Creatinine, Ser: 0.67 mg/dL (ref 0.40–1.20)
GFR: 93.11 mL/min (ref >60.00)
GLUCOSE: 70 mg/dL (ref 70–99)
POTASSIUM: 4.5 meq/L (ref 3.5–5.1)
Sodium: 139 mEq/L (ref 135–145)
TOTAL PROTEIN: 6.8 g/dL (ref 6.0–8.3)
Total Bilirubin: 0.3 mg/dL (ref 0.2–1.2)

## 2016-02-12 LAB — LIPID PANEL
CHOL/HDL RATIO: 2
Cholesterol: 157 mg/dL (ref 0–200)
HDL: 71.3 mg/dL (ref >39.00)
LDL CALC: 75 mg/dL (ref 0–99)
NONHDL: 85.64
Triglycerides: 54 mg/dL (ref 0.0–149.0)
VLDL: 10.8 mg/dL (ref 0.0–40.0)

## 2016-02-19 ENCOUNTER — Other Ambulatory Visit: Payer: Self-pay | Admitting: Family Medicine

## 2016-02-19 ENCOUNTER — Ambulatory Visit (INDEPENDENT_AMBULATORY_CARE_PROVIDER_SITE_OTHER): Payer: 59 | Admitting: Family Medicine

## 2016-02-19 ENCOUNTER — Other Ambulatory Visit: Payer: Self-pay

## 2016-02-19 VITALS — BP 128/60 | HR 76 | Temp 98.3°F | Resp 18 | Ht 63.0 in | Wt 241.0 lb

## 2016-02-19 DIAGNOSIS — F419 Anxiety disorder, unspecified: Secondary | ICD-10-CM | POA: Diagnosis not present

## 2016-02-19 DIAGNOSIS — E785 Hyperlipidemia, unspecified: Secondary | ICD-10-CM

## 2016-02-19 MED ORDER — SIMVASTATIN 20 MG PO TABS: 20.0000 mg | ORAL_TABLET | Freq: Every day | ORAL | 3 refills | Status: DC

## 2016-02-19 MED ORDER — ALPRAZOLAM 0.25 MG PO TABS: 0.2500 mg | ORAL_TABLET | Freq: Three times a day (TID) | ORAL | 0 refills | Status: DC | PRN

## 2016-02-19 NOTE — Patient Instructions (Signed)

## 2016-02-19 NOTE — Progress Notes (Signed)
Patient ID: [name] [name], female    DOB: Jul 28, 1945  Age: 70 y.o. MRN: [id number]    Subjective:  Subjective  HPI [name] [name] presents for f/u cholesterol , thyroid and anxiety No complaints.  Review of Systems  Constitutional: Negative for appetite change, diaphoresis, fatigue and unexpected weight change.  Eyes: Negative for pain, redness and visual disturbance.  Respiratory: Negative for cough, chest tightness, shortness of breath and wheezing.   Cardiovascular: Negative for chest pain, palpitations and leg swelling.  Endocrine: Negative for cold intolerance, heat intolerance, polydipsia, polyphagia and polyuria.  Genitourinary: Negative for difficulty urinating, dysuria and frequency.  Neurological: Negative for dizziness, light-headedness, numbness and headaches.    History Past Medical History:  Diagnosis Date  . Allergic rhinitis   . Arthritis   . Cataract   . Depression   . Endometrial polyp   . Hyperlipidemia   . Hyperlipidemia    on simvastatin  . Hypothyroidism   . Obesity   . 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   Her family history includes Breast cancer (age of onset: 58) in her maternal aunt; Cancer (age of onset: 70) in her father; Cancer (age of onset: 22) in her brother and paternal grandmother; Cancer (age of onset: 51) in her sister; Cancer (age of onset: 32) in her mother; Colon cancer in her paternal grandmother; Colon cancer (age of onset: 63) in her father; Heart attack (age of onset: 42) in her maternal grandmother; Heart attack (age of onset: 64) in her maternal aunt; Heart disease in her maternal grandmother; Pancreatic cancer (age of onset: 13) in her mother; Thyroid disease in her sister and sister.She reports that she quit smoking about 3 years ago.  Her smoking use included Cigarettes. She has a 84.00 pack-year smoking history. She has never used smokeless tobacco. She reports that she drinks alcohol. She reports that she does not use drugs.  Current Outpatient Prescriptions on File Prior to Visit  Medication Sig Dispense Refill  . aspirin EC 81 MG tablet Take 1 tablet (81 mg total) by mouth daily.    . cholecalciferol (VITAMIN D) 1000 UNITS tablet Take 1,000 Units by mouth daily.    Marland Kitchen escitalopram (LEXAPRO) 20 MG tablet Take 1 tablet by mouth  daily 90 tablet 3  . ibuprofen (ADVIL,MOTRIN) 200 MG tablet Take 200 mg by mouth 4 (four) times daily as needed.    Marland Kitchen levocetirizine (XYZAL) 5 MG tablet Take 1 tablet (5 mg total) by mouth every evening. 30 tablet 11  . levothyroxine (SYNTHROID, LEVOTHROID) 100 MCG tablet Take 1 tablet by mouth  daily before breakfast 90 tablet 3  . loperamide (IMODIUM) 2 MG capsule Take 1 capsule by mouth two times daily 180 capsule 1  . multivitamin (THERAGRAN) per tablet Take 1 tablet by mouth daily.      Marland Kitchen Ubiquinol 100 MG CAPS Take by mouth.     No current facility-administered medications on file prior to visit.      Objective:  Objective  Physical Exam  Constitutional: She is oriented to person, place, and time. She appears well-developed and well-nourished.  HENT:  Head: Normocephalic and atraumatic.  Nose: No nasal deformity.  Mouth/Throat: Oropharynx is clear and moist and mucous membranes are normal. No oropharyngeal exudate.  Eyes: Conjunctivae and EOM are normal.  Neck: Normal range of motion.  Neck supple. No JVD present. Carotid bruit is not present. No thyromegaly present.  Cardiovascular: Normal rate, regular rhythm and normal heart sounds.   No murmur heard. Pulmonary/Chest: Effort normal and breath sounds normal. No respiratory distress. She has no wheezes. She has no rales. She exhibits no tenderness.  Abdominal: Soft. She exhibits no distension. There is no tenderness. There is no  rebound, no guarding and no CVA tenderness.  Genitourinary: Pelvic exam was performed with patient supine. There is no rash, tenderness, lesion or injury on the right labia. There is no rash, tenderness, lesion or injury on the left labia. No erythema or tenderness in the vagina. No vaginal discharge found.  Musculoskeletal: She exhibits no edema.  Lymphadenopathy:    She has no cervical adenopathy.  Neurological: She is alert and oriented to person, place, and time.  Skin: Skin is warm. She is not diaphoretic.  Psychiatric: She has a normal mood and affect.  Nursing note and vitals reviewed.  BP 128/60 (BP Location: Right Arm, Patient Position: Sitting, Cuff Size: Large)   Pulse 76   Temp 98.3 F (36.8 C)   Resp 18   Ht 5\' 3"  (1.6 m)   Wt 241 lb (109.3 kg)   SpO2 98%   BMI 42.69 kg/m  Wt Readings from Last 3 Encounters:  02/19/16 241 lb (109.3 kg)  11/21/15 241 lb (109.3 kg)  04/28/15 240 lb (108.9 kg)     Lab Results  Component Value Date   WBC 3.8 (L) 11/08/2015   HGB 13.4 11/08/2015   HCT 40.5 11/08/2015   PLT 185.0 11/08/2015   GLUCOSE 70 02/12/2016   CHOL 157 02/12/2016   TRIG 54.0 02/12/2016   HDL 71.30 02/12/2016   LDLDIRECT 129.1 06/13/2011   LDLCALC 75 02/12/2016   ALT 16 02/12/2016   AST 20 02/12/2016   NA 139 02/12/2016   K 4.5 02/12/2016   CL 105 02/12/2016   CREATININE 0.67 02/12/2016   BUN 22 02/12/2016   CO2 27 02/12/2016   TSH 3.09 11/08/2015   HGBA1C 5.5 01/23/2015   MICROALBUR 0.2 02/01/2013    Ct Chest Lung Ca Screen Low Dose W/o Cm  Result Date: 01/31/2016 CLINICAL DATA:  Former smoker, quit 3 years ago, 74 pack-year history. EXAM: CT CHEST WITHOUT CONTRAST LOW-DOSE FOR LUNG CANCER SCREENING TECHNIQUE: Multidetector CT imaging of the chest was performed following the standard protocol without IV contrast. COMPARISON:  01/26/2015. FINDINGS: Cardiovascular: Vascular structures are grossly unremarkable. Heart is at the upper limits of normal in  size. No pericardial effusion. Mediastinum/Nodes: No pathologically enlarged mediastinal or axillary lymph nodes. Hilar regions are difficult to definitively evaluate without IV contrast. Esophagus is grossly unremarkable. Lungs/Pleura: Mild centrilobular emphysema. Pulmonary nodules measure 4.6 mm (equivalent diameter) or less in size, stable. No pleural fluid. Airway is unremarkable. Upper abdomen: Visualized portion of the liver is unremarkable. Small stones are seen in the gallbladder. Visualized portions of the adrenal glands, kidneys, spleen and stomach are grossly unremarkable. Musculoskeletal: No worrisome lytic or sclerotic lesions. Degenerative changes are seen in the spine. There may be an infarct or enchondroma in the proximal right humerus. 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 IMPRESSION: 1. Lung-RADS Category 2, benign appearance or behavior. Continue annual screening with low-dose chest CT without contrast in 12 months. 2. Cholelithiasis. Electronically Signed   By: Lorin Picket M.D.   On: 01/31/2016 15:21     Assessment & Plan:  Plan  I have discontinued Ms. Mira's ASTAXANTHIN  PO and simvastatin. I am also having her start on simvastatin. Additionally, I am having her maintain her multivitamin, Ubiquinol, ibuprofen, cholecalciferol, aspirin EC, levocetirizine, levothyroxine, escitalopram, loperamide, and ALPRAZolam.  Meds ordered this encounter  Medications  . simvastatin (ZOCOR) 20 MG tablet    Sig: Take 1 tablet (20 mg total) by mouth at bedtime.    Dispense:  90 tablet    Refill:  3  . ALPRAZolam (XANAX) 0.25 MG tablet    Sig: Take 1 tablet (0.25 mg total) by mouth 3 (three) times daily as needed for anxiety.    Dispense:  60 tablet    Refill:  0    Problem List Items Addressed This Visit      Unprioritized   Hyperlipidemia - Primary   Relevant Medications   simvastatin (ZOCOR) 20 MG tablet    Other Visit Diagnoses    Anxiety       Relevant  Medications   ALPRAZolam (XANAX) 0.25 MG tablet    stable-- no complaints rto 6 months for f/u and labs  Follow-up: Return in about 6 months (around 08/21/2016) for hyperlipidemia.  [name], DO

## 2016-02-19 NOTE — Progress Notes (Signed)
Pre visit review using our clinic review tool, if applicable. No additional management support is needed unless otherwise documented below in the visit note. 

## 2016-02-21 ENCOUNTER — Encounter: Payer: Self-pay | Admitting: Family Medicine

## 2016-03-08 ENCOUNTER — Encounter: Payer: Self-pay | Admitting: Family Medicine

## 2016-08-10 ENCOUNTER — Encounter: Payer: Self-pay | Admitting: Family Medicine

## 2016-08-12 NOTE — Telephone Encounter (Signed)
Denise-please call Pt and reschedule Pt's appt to March (okay per Dr. Etter Sjogren). Thank you.

## 2016-08-12 NOTE — Telephone Encounter (Signed)
Ok to put off appointment --- to end of March

## 2016-08-14 ENCOUNTER — Other Ambulatory Visit: Payer: 59

## 2016-08-22 ENCOUNTER — Ambulatory Visit: Payer: 59 | Admitting: Family Medicine

## 2016-09-24 ENCOUNTER — Other Ambulatory Visit: Payer: Self-pay | Admitting: Family Medicine

## 2016-10-06 IMAGING — MG MM DIGITAL SCREENING BILAT W/ CAD
5 series · 5 of 5 positions shown · non-contrast
Comparison: Previous exam(s)

ACR Breast Density Category a: The breast tissue is almost entirely
fatty.

CLINICAL DATA: Screening.

EXAM:
DIGITAL SCREENING BILATERAL MAMMOGRAM WITH CAD

[R CC]
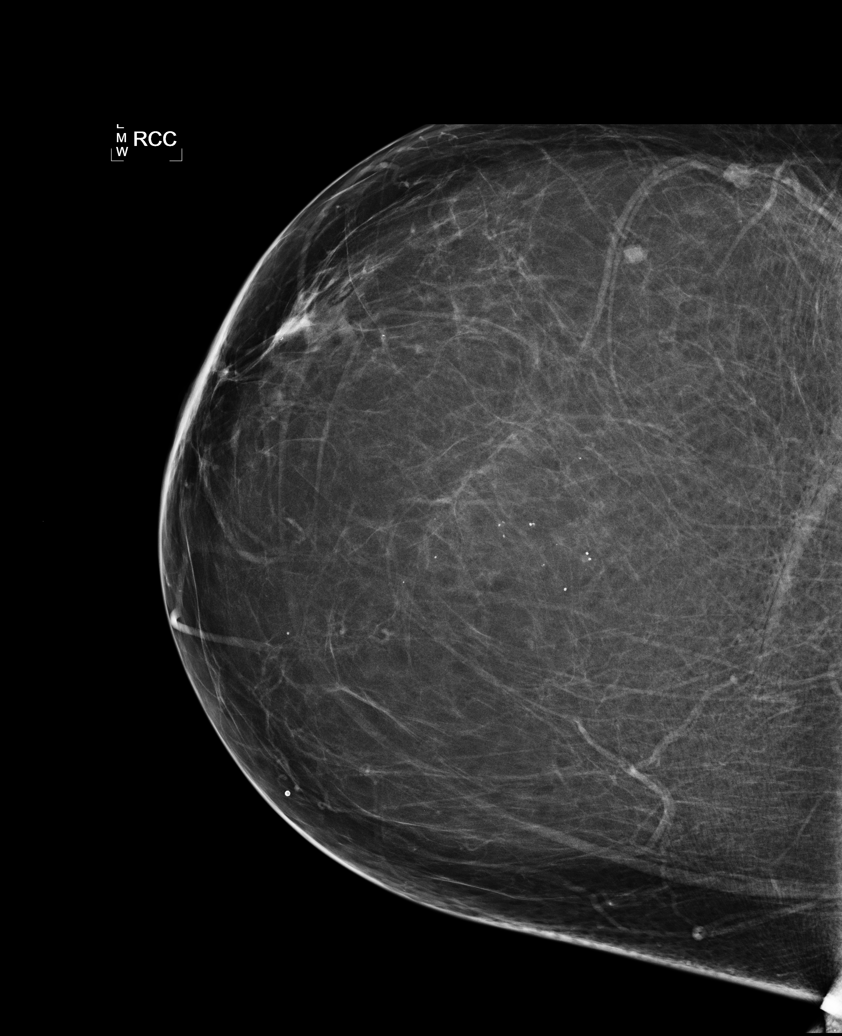

[L CC]
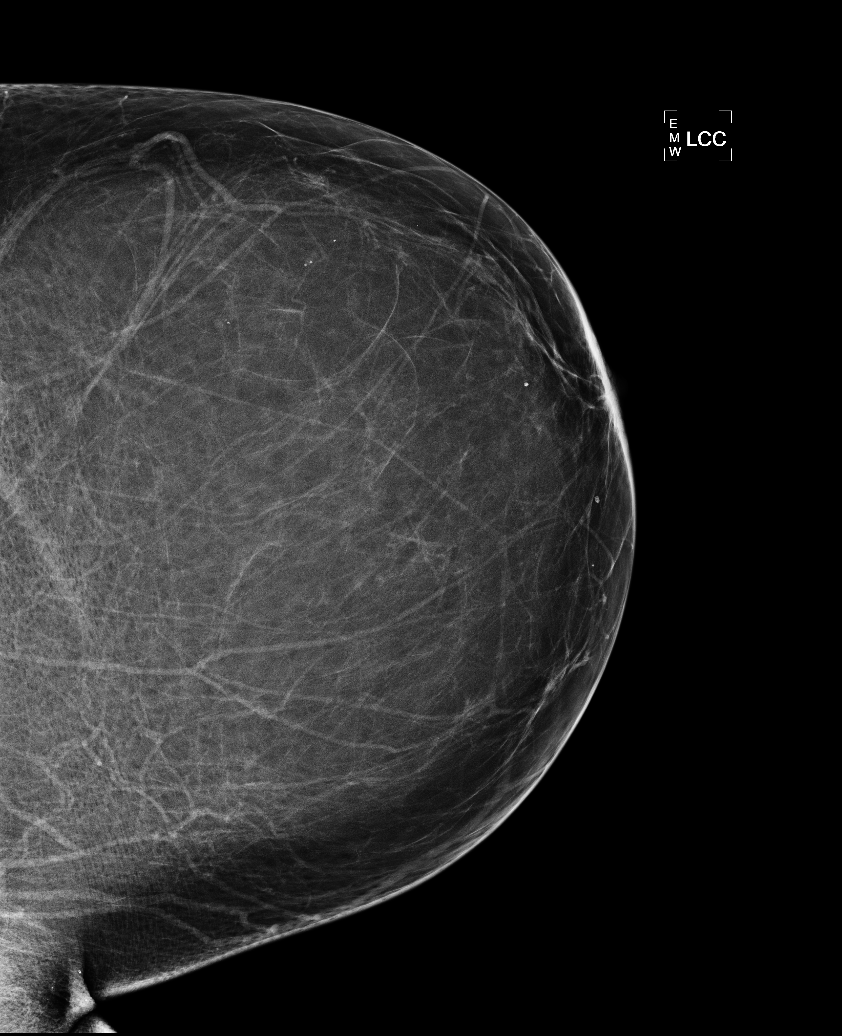

[L MLO]
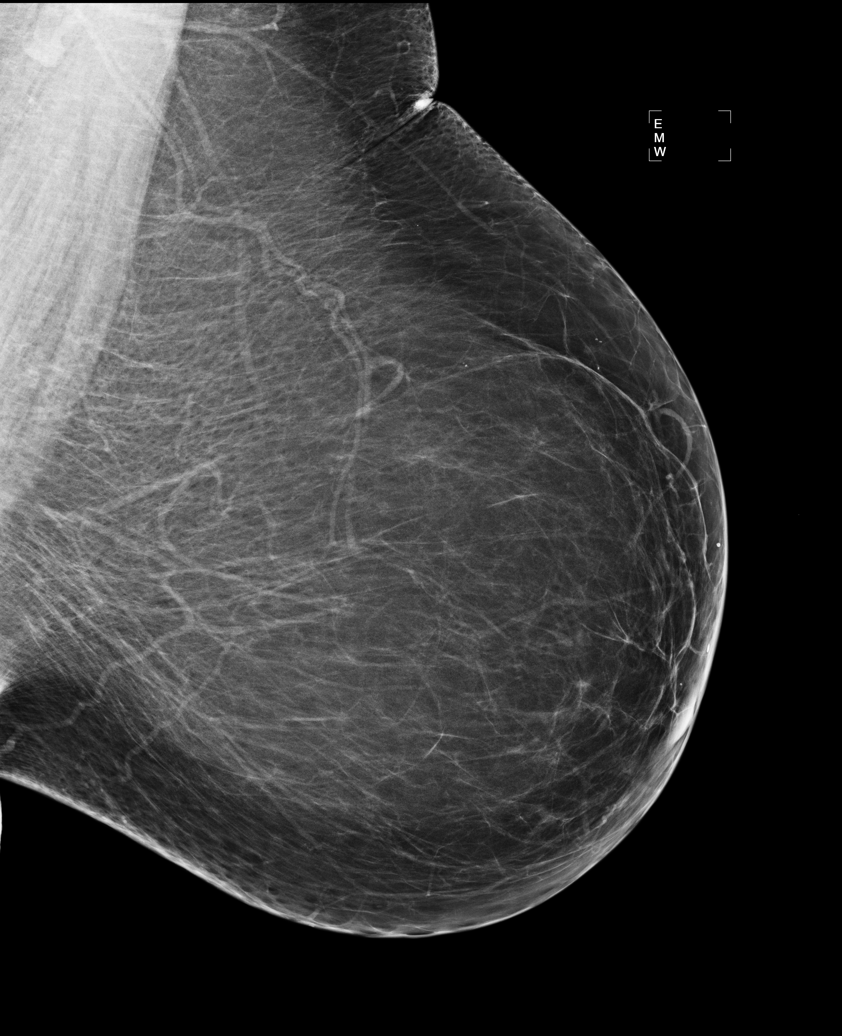

[R MLO (1 of 2)]
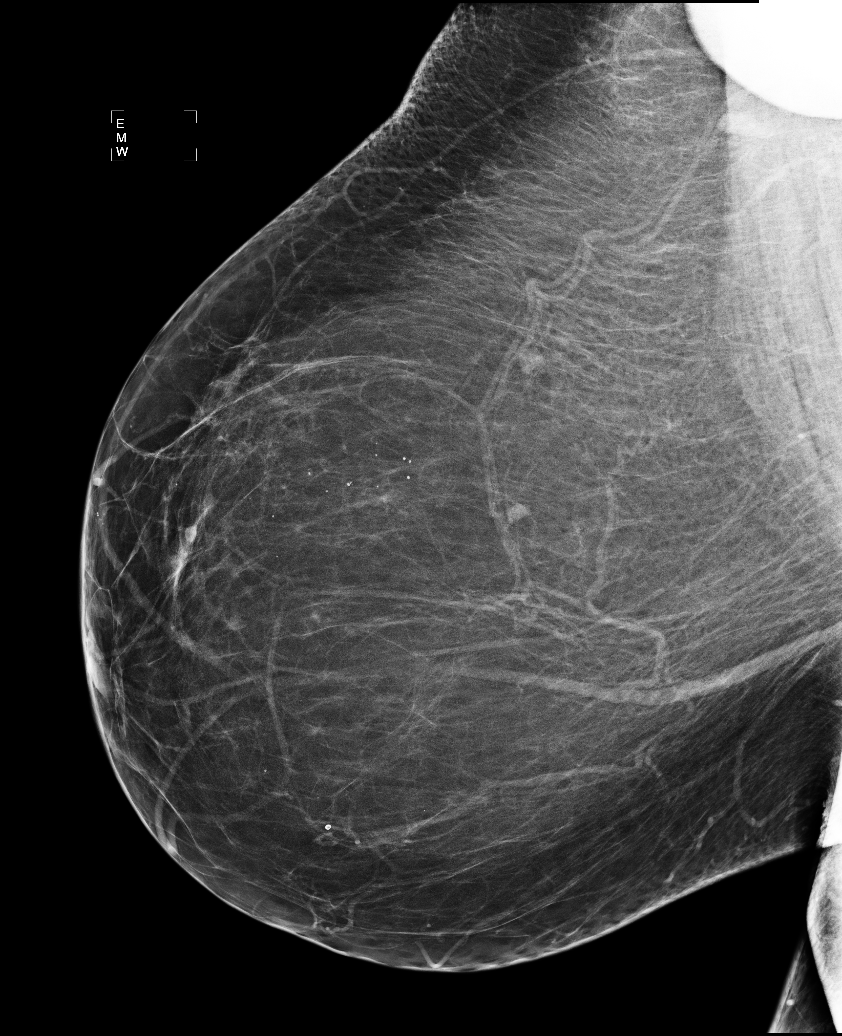

[R MLO (2 of 2)]
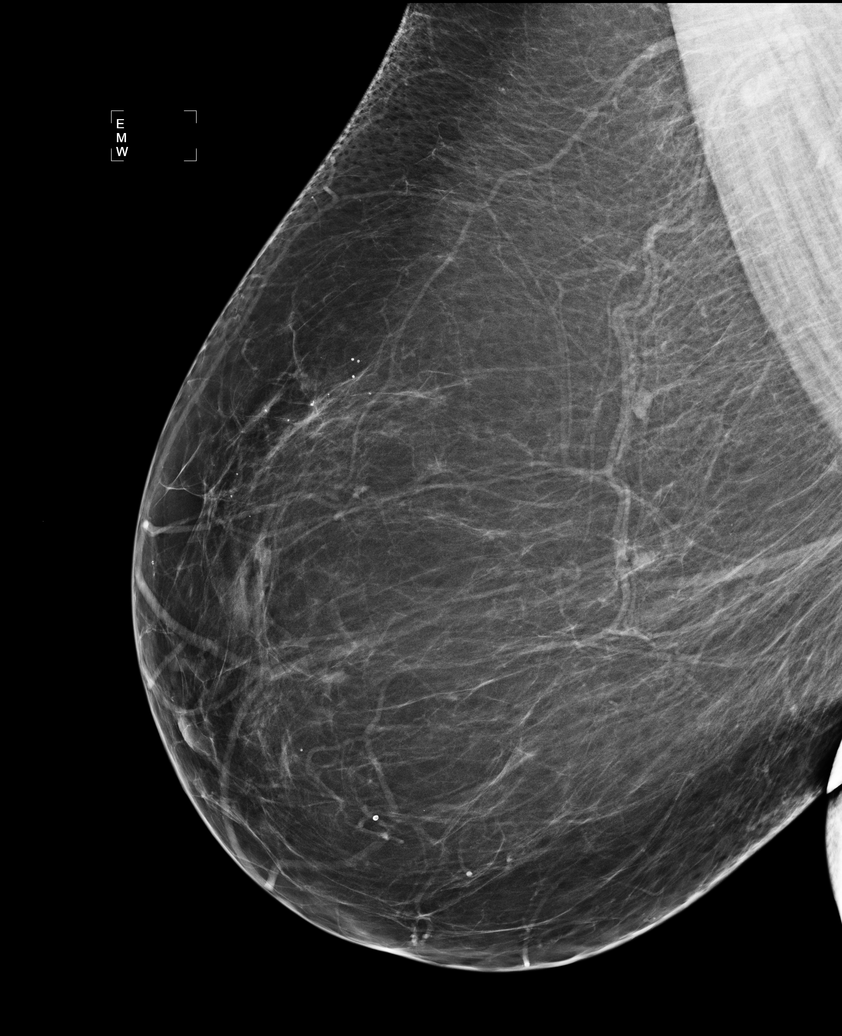

[5 of 5 positions shown; findings below may reference images not displayed]

FINDINGS: There are no findings suspicious for malignancy. Images were
processed with CAD.
IMPRESSION: No mammographic evidence of malignancy. A result letter of this
screening mammogram will be mailed directly to the patient.

RECOMMENDATION:
Screening mammogram in one year. (Code:[id number])

BI-RADS CATEGORY  1: Negative.

## 2016-10-24 LAB — HM PAP SMEAR

## 2016-12-04 ENCOUNTER — Encounter: Payer: Self-pay | Admitting: Family Medicine

## 2016-12-05 NOTE — Telephone Encounter (Signed)
Everything else-- vaccines if needed, labs  Etc

## 2016-12-11 ENCOUNTER — Encounter: Payer: Self-pay | Admitting: *Deleted

## 2016-12-12 ENCOUNTER — Other Ambulatory Visit: Payer: Self-pay | Admitting: Family Medicine

## 2016-12-19 ENCOUNTER — Other Ambulatory Visit: Payer: Self-pay | Admitting: Acute Care

## 2016-12-19 DIAGNOSIS — Z87891 Personal history of nicotine dependence: Secondary | ICD-10-CM

## 2016-12-24 ENCOUNTER — Other Ambulatory Visit (INDEPENDENT_AMBULATORY_CARE_PROVIDER_SITE_OTHER): Payer: 59

## 2016-12-24 DIAGNOSIS — E785 Hyperlipidemia, unspecified: Secondary | ICD-10-CM

## 2016-12-24 LAB — LIPID PANEL
CHOLESTEROL: 151 mg/dL (ref 0–200)
HDL: 62.9 mg/dL (ref >39.00)
LDL Cholesterol: 72 mg/dL (ref 0–99)
NonHDL: 88.25
Total CHOL/HDL Ratio: 2
Triglycerides: 79 mg/dL (ref 0.0–149.0)
VLDL: 15.8 mg/dL (ref 0.0–40.0)

## 2016-12-24 LAB — COMPREHENSIVE METABOLIC PANEL
ALT: 14 U/L (ref 0–35)
AST: 17 U/L (ref 0–37)
Albumin: 4.1 g/dL (ref 3.5–5.2)
Alkaline Phosphatase: 65 U/L (ref 39–117)
BUN: 19 mg/dL (ref 6–23)
CALCIUM: 9.4 mg/dL (ref 8.4–10.5)
CHLORIDE: 106 meq/L (ref 96–112)
CO2: 30 meq/L (ref 19–32)
Creatinine, Ser: 0.63 mg/dL (ref 0.40–1.20)
GFR: 99.71 mL/min (ref >60.00)
GLUCOSE: 114 mg/dL — AB (ref 70–99)
POTASSIUM: 4.2 meq/L (ref 3.5–5.1)
Sodium: 140 mEq/L (ref 135–145)
Total Bilirubin: 0.3 mg/dL (ref 0.2–1.2)
Total Protein: 6.4 g/dL (ref 6.0–8.3)

## 2016-12-31 ENCOUNTER — Other Ambulatory Visit: Payer: Self-pay | Admitting: Family Medicine

## 2016-12-31 DIAGNOSIS — E785 Hyperlipidemia, unspecified: Secondary | ICD-10-CM

## 2016-12-31 DIAGNOSIS — R739 Hyperglycemia, unspecified: Secondary | ICD-10-CM

## 2017-01-30 ENCOUNTER — Ambulatory Visit (HOSPITAL_BASED_OUTPATIENT_CLINIC_OR_DEPARTMENT_OTHER)
Admission: RE | Admit: 2017-01-30 | Discharge: 2017-01-30 | Disposition: A | Discharge status: Home / Self Care | Payer: 59 | Source: Ambulatory Visit | Attending: Acute Care | Admitting: Acute Care

## 2017-01-30 DIAGNOSIS — J439 Emphysema, unspecified: Secondary | ICD-10-CM | POA: Diagnosis not present

## 2017-01-30 DIAGNOSIS — Z87891 Personal history of nicotine dependence: Secondary | ICD-10-CM | POA: Insufficient documentation

## 2017-01-30 DIAGNOSIS — Z122 Encounter for screening for malignant neoplasm of respiratory organs: Secondary | ICD-10-CM | POA: Insufficient documentation

## 2017-02-03 ENCOUNTER — Telehealth: Payer: Self-pay | Admitting: Acute Care

## 2017-02-06 NOTE — Telephone Encounter (Signed)
Pt informed of CT results per Eric Form, NP.  PT verbalized understanding.  Copy sent to PCP.  Order placed for 1 yr f/u CT.  Pt was also concerned about emphysema noted on CT.  I advised pt that we cannot tell the severity of this by CT scan.  I advised her to discuss this with Dr Carollee Herter and if she feels the need she can refer pt to see one of our pulmonologists to evaluate her further.

## 2017-02-07 ENCOUNTER — Other Ambulatory Visit: Payer: Self-pay | Admitting: Family Medicine

## 2017-02-07 ENCOUNTER — Encounter: Payer: Self-pay | Admitting: Family Medicine

## 2017-02-07 DIAGNOSIS — Z0000 Encounter for general adult medical examination without abnormal findings: Secondary | ICD-10-CM

## 2017-02-07 NOTE — Telephone Encounter (Signed)
Order is in.

## 2017-02-13 ENCOUNTER — Telehealth: Payer: Self-pay | Admitting: Family Medicine

## 2017-02-13 NOTE — Telephone Encounter (Signed)
Patient returned call and stated PCP didn't request check up mentioned below but patient stated she's in need of form filled out. Nurse appointment cancelled and [facility] with Percell Miller.

## 2017-02-13 NOTE — Telephone Encounter (Signed)
Called pt, lvm for RN. Pt is scheduled a nurse visit tomorrow for a BP check and high and weight. RN would like to know if PCP requested? If not, why is BP check and high/ weight check needed.. RN says that it sounds that pt need a ov instead.

## 2017-02-14 ENCOUNTER — Telehealth: Payer: Self-pay | Admitting: Family Medicine

## 2017-02-14 ENCOUNTER — Other Ambulatory Visit: Payer: 59

## 2017-02-14 ENCOUNTER — Ambulatory Visit (INDEPENDENT_AMBULATORY_CARE_PROVIDER_SITE_OTHER): Payer: 59 | Admitting: Medical

## 2017-02-14 VITALS — BP 126/62 | HR 72 | Temp 98.1°F | Ht 60.5 in | Wt 261.0 lb

## 2017-02-14 DIAGNOSIS — R739 Hyperglycemia, unspecified: Secondary | ICD-10-CM | POA: Diagnosis not present

## 2017-02-14 LAB — COMPREHENSIVE METABOLIC PANEL
ALBUMIN: 4.1 g/dL (ref 3.5–5.2)
ALK PHOS: 65 U/L (ref 39–117)
ALT: 11 U/L (ref 0–35)
AST: 17 U/L (ref 0–37)
BUN: 17 mg/dL (ref 6–23)
CHLORIDE: 103 meq/L (ref 96–112)
CO2: 31 mEq/L (ref 19–32)
Calcium: 9.1 mg/dL (ref 8.4–10.5)
Creatinine, Ser: 0.67 mg/dL (ref 0.40–1.20)
GFR: 92.83 mL/min (ref >60.00)
GLUCOSE: 90 mg/dL (ref 70–99)
POTASSIUM: 4.1 meq/L (ref 3.5–5.1)
SODIUM: 139 meq/L (ref 135–145)
TOTAL PROTEIN: 6.3 g/dL (ref 6.0–8.3)
Total Bilirubin: 0.4 mg/dL (ref 0.2–1.2)

## 2017-02-14 LAB — HEMOGLOBIN A1C: HEMOGLOBIN A1C: 5.8 % (ref 4.6–6.5)

## 2017-02-14 NOTE — Patient Instructions (Addendum)
For mild high sugar will get cmp and a1c. Will follow results and fill out your insurance form.  Low sugar diet recommended and exercise.   Follow up as regularly scheduled with pcp or as needed

## 2017-02-14 NOTE — Progress Notes (Signed)
Subjective:    Patient ID: [name] [name], female    DOB: 02-Mar-1946, 71 y.o.   MRN: [id number]  HPI   Pt in for evaluation/form to be filled out.  Pt states in past she get her physical with ob/gyn.   Appears future labs placed for upcoming visit with Dr Etter Sjogren.   Pt has mild high sugars in the past.  No report of hyperglycemic signs or symptoms.   Pt needs form filled out for insurance filled out.   Review of Systems  Constitutional: Negative for chills, fatigue and fever.  Respiratory: Negative for cough, chest tightness, shortness of breath and wheezing.   Cardiovascular: Negative for chest pain and palpitations.  Gastrointestinal: Negative for abdominal pain.  Endocrine: Negative for polydipsia, polyphagia and polyuria.  Musculoskeletal: Negative for back pain.  Neurological: Negative for dizziness and headaches.  Hematological: Negative for adenopathy. Does not bruise/bleed easily.  Psychiatric/Behavioral: Negative for behavioral problems and confusion.   Past Medical History:  Diagnosis Date  . Allergic rhinitis   . Arthritis   . Cataract   . Depression   . Endometrial polyp   . Hyperlipidemia   . Hyperlipidemia    on simvastatin  . Hypothyroidism   . Obesity   . Seasonal allergies      Social History   Social History  . Marital status: Married    Spouse name: N/A  . Number of children: N/A  . Years of education: N/A   Occupational History  . Daycare p/t    Social History Main Topics  . Smoking status: Former Smoker    Packs/day: 2.00    Years: 42.00    Types: Cigarettes    Quit date: 07/28/2012  . Smokeless tobacco: Never Used  . Alcohol use 0.0 oz/week     Comment: rare  . Drug use: No  . Sexual activity: Yes    Partners: Male    Birth control/ protection: Post-menopausal   Other Topics Concern  . Not on file   Social History Narrative   Regular exercise- no     Past Surgical History:  Procedure Laterality Date  . BUNIONECTOMY   2009   Dr.Duda, left  . EYE SURGERY    . HYSTEROSCOPY W/D&C  04/24/2011   Procedure: DILATATION AND CURETTAGE (D&C) /HYSTEROSCOPY;  Surgeon: Eldred Manges, MD;  Location: [facility];  Service: Gynecology;  Laterality: N/A;  . 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 PLANTAR FASCIA SURGERY  1994   Dr Percell Miller  . POLYPECTOMY  04/24/2011   Procedure: POLYPECTOMY;  Surgeon: Eldred Manges, MD;  Location: [facility];  Service: Gynecology;  Laterality: N/A;    Family History  Problem Relation Age of Onset  . Colon cancer Father 54  . Cancer Father 5       colon  . Pancreatic cancer Mother 61  . Cancer Mother 11       pancreatic  . Colon cancer Paternal Grandmother   . Cancer Paternal Grandmother 50       colon  . Heart attack Maternal Grandmother 50  . Heart disease Maternal Grandmother   . Breast cancer Maternal Aunt 30  . Heart attack Maternal Aunt 60  . Cancer Sister 34       melanoma  . Thyroid disease Sister   . Cancer Brother 49       skin  . Thyroid disease Sister     No Known Allergies  Current Outpatient Prescriptions  on File Prior to Visit  Medication Sig Dispense Refill  . ALPRAZolam (XANAX) 0.25 MG tablet Take 1 tablet (0.25 mg total) by mouth 3 (three) times daily as needed for anxiety. (Patient taking differently: Take 0.25 mg by mouth as needed for anxiety. ) 60 tablet 0  . aspirin EC 81 MG tablet Take 1 tablet (81 mg total) by mouth daily.    Marland Kitchen escitalopram (LEXAPRO) 20 MG tablet TAKE 1 TABLET BY MOUTH  DAILY 90 tablet 0  . ibuprofen (ADVIL,MOTRIN) 200 MG tablet Take 200 mg by mouth 4 (four) times daily as needed.    Marland Kitchen levocetirizine (XYZAL) 5 MG tablet Take 1 tablet (5 mg total) by mouth every evening. 30 tablet 11  . levothyroxine (SYNTHROID, LEVOTHROID) 100 MCG tablet TAKE 1 TABLET BY MOUTH  DAILY BEFORE BREAKFAST 90 tablet 1  . loperamide (IMODIUM) 2 MG capsule Take 1 capsule by mouth two times daily 180 capsule 1  . multivitamin (THERAGRAN) per  tablet Take 1 tablet by mouth daily.      . simvastatin (ZOCOR) 20 MG tablet Take 1 tablet (20 mg total) by mouth at bedtime. 90 tablet 3  . Ubiquinol 100 MG CAPS Take by mouth.     No current facility-administered medications on file prior to visit.     BP 126/62   Pulse 72   Temp 98.1 F (36.7 C) (Oral)   Ht 5' 0.5" (1.537 m)   Wt 261 lb (118.4 kg)   SpO2 99%   BMI 50.13 kg/m       Objective:   Physical Exam  General- No acute distress. Pleasant patient. Neck- Full range of motion, no jvd Lungs- Clear, even and unlabored. Heart- regular rate and rhythm. Neurologic- CNII- XII grossly intact.        Assessment & Plan:  For mild high sugar will get cmp and a1c. Will follow results and fill out your insurance form.  Low sugar diet recommended and exercise.   Follow up as regularly scheduled with pcp or as needed  Note since recent cmp considered fsbs to check sugar but pt really expressed dislike(almost anxiety) over finger stick checks.  Bueford Arp, Percell Miller, PA-C

## 2017-02-14 NOTE — Telephone Encounter (Signed)
Pt came in today. She would like to know if she still need to come in to have lab apt that is scheduled? Please advise.

## 2017-02-14 NOTE — Telephone Encounter (Signed)
Called and line was busy.

## 2017-02-14 NOTE — Telephone Encounter (Signed)
Noted. Thanks.

## 2017-02-15 ENCOUNTER — Other Ambulatory Visit: Payer: Self-pay | Admitting: Family Medicine

## 2017-02-15 DIAGNOSIS — E785 Hyperlipidemia, unspecified: Secondary | ICD-10-CM

## 2017-02-17 NOTE — Telephone Encounter (Signed)
Called again and line busy

## 2017-02-18 ENCOUNTER — Other Ambulatory Visit: Payer: Self-pay | Admitting: Family Medicine

## 2017-02-18 ENCOUNTER — Encounter: Payer: Self-pay | Admitting: Family Medicine

## 2017-02-18 DIAGNOSIS — J439 Emphysema, unspecified: Secondary | ICD-10-CM

## 2017-02-21 ENCOUNTER — Encounter: Payer: Self-pay | Admitting: Family Medicine

## 2017-02-25 ENCOUNTER — Encounter: Payer: Self-pay | Admitting: *Deleted

## 2017-04-03 ENCOUNTER — Other Ambulatory Visit: Payer: 59

## 2017-05-01 ENCOUNTER — Encounter: Payer: Self-pay | Admitting: Pulmonary Disease

## 2017-05-01 ENCOUNTER — Ambulatory Visit (INDEPENDENT_AMBULATORY_CARE_PROVIDER_SITE_OTHER): Payer: 59 | Admitting: Pulmonary Disease

## 2017-05-01 ENCOUNTER — Institutional Professional Consult (permissible substitution): Payer: 59 | Admitting: Pulmonary Disease

## 2017-05-01 VITALS — BP 142/80 | HR 75 | Ht 60.5 in | Wt 262.0 lb

## 2017-05-01 DIAGNOSIS — J432 Centrilobular emphysema: Secondary | ICD-10-CM | POA: Diagnosis not present

## 2017-05-01 NOTE — Progress Notes (Signed)
Subjective:    Patient ID: [name] [name], female    DOB: 21-Jan-1946, 71 y.o.   MRN: [id number]  Synopsis: Former heavy smoker referred for emphysema in 2018  HPI Chief Complaint  Patient presents with  . Advice Only    Referred by Dr. Carollee Herter for emphysema.  CAT :12.    [name] [name] used to smoke cigarettes for 44 years, 2 packs per day.  She quit in 2014.    Childhood was normal with out lung problems. She used to get bronchitis a lot when she was a smoker in her early 46s.  At this point she notes some dyspnea when she climbs stairs: > has to stop to catch her breath > She denies wheezing or tightness  Doesn't use any inhalers.    She has never needed oxygen. She's never been hospitalized for respiratory problem.   She says that she thinks she may need to have knee surgery soon because of bilateral osteoarthritis of the knees.   Past Medical History:  Diagnosis Date  . Allergic rhinitis   . Arthritis   . Cataract   . Depression   . Endometrial polyp   . Hyperlipidemia   . Hyperlipidemia    on simvastatin  . Hypothyroidism   . Obesity   . Seasonal allergies      Family History  Problem Relation Age of Onset  . Colon cancer Father 54  . Cancer Father 87       colon  . Pancreatic cancer Mother 50  . Cancer Mother 69       pancreatic  . Colon cancer Paternal Grandmother   . Cancer Paternal Grandmother 75       colon  . Heart attack Maternal Grandmother 50  . Heart disease Maternal Grandmother   . Cancer Sister 52       melanoma  . Thyroid disease Sister   . Cancer Brother 62       skin  . Thyroid disease Sister   . Breast cancer Maternal Aunt 30  . Heart attack Maternal Aunt 60     Social History   Social History  . Marital status: Married    Spouse name: N/A  . Number of children: N/A  . Years of education: N/A   Occupational History  . Daycare p/t    Social History Main Topics  . Smoking status: Former Smoker    Packs/day: 2.00   Years: 42.00    Types: Cigarettes    Quit date: 07/28/2012  . Smokeless tobacco: Never Used  . Alcohol use 0.0 oz/week     Comment: rare  . Drug use: No  . Sexual activity: Yes    Partners: Male    Birth control/ protection: Post-menopausal   Other Topics Concern  . Not on file   Social History Narrative   Regular exercise- no      No Known Allergies   Outpatient Medications Prior to Visit  Medication Sig Dispense Refill  . ALPRAZolam (XANAX) 0.25 MG tablet Take 1 tablet (0.25 mg total) by mouth 3 (three) times daily as needed for anxiety. (Patient taking differently: Take 0.25 mg by mouth as needed for anxiety. ) 60 tablet 0  . aspirin EC 81 MG tablet Take 1 tablet (81 mg total) by mouth daily.    Marland Kitchen escitalopram (LEXAPRO) 20 MG tablet TAKE 1 TABLET BY MOUTH  DAILY 90 tablet 0  . ibuprofen (ADVIL,MOTRIN) 200 MG tablet Take 200 mg by mouth 4 (four)  times daily as needed.    Marland Kitchen levothyroxine (SYNTHROID, LEVOTHROID) 100 MCG tablet TAKE 1 TABLET BY MOUTH  DAILY BEFORE BREAKFAST 90 tablet 0  . loperamide (IMODIUM) 2 MG capsule TAKE 1 CAPSULE BY MOUTH TWO TIMES DAILY 180 capsule 0  . multivitamin (THERAGRAN) per tablet Take 1 tablet by mouth daily.      . simvastatin (ZOCOR) 20 MG tablet TAKE 1 TABLET BY MOUTH AT  BEDTIME 90 tablet 0  . Ubiquinol 100 MG CAPS Take by mouth.    . Coenzyme Q10 (COQ-10 PO) Take 1 tablet by mouth daily.    Marland Kitchen levocetirizine (XYZAL) 5 MG tablet Take 1 tablet (5 mg total) by mouth every evening. (Patient not taking: Reported on 05/01/2017) 30 tablet 11   No facility-administered medications prior to visit.       Review of Systems  Constitutional: Negative for fever and unexpected weight change.  HENT: Negative for congestion, dental problem, ear pain, nosebleeds, postnasal drip, rhinorrhea, sinus pressure, sneezing, sore throat and trouble swallowing.   Eyes: Negative for redness and itching.  Respiratory: Positive for shortness of breath. Negative for  cough, chest tightness and wheezing.   Cardiovascular: Negative for palpitations and leg swelling.  Gastrointestinal: Negative for nausea and vomiting.  Genitourinary: Negative for dysuria.  Musculoskeletal: Negative for joint swelling.  Skin: Negative for rash.  Neurological: Negative for headaches.  Hematological: Does not bruise/bleed easily.  Psychiatric/Behavioral: Negative for dysphoric mood. The patient is not nervous/anxious.        Objective:   Physical Exam Vitals:   05/01/17 1333  BP: (!) 142/80  Pulse: 75  SpO2: 95%  Weight: 262 lb (118.8 kg)  Height: 5' 0.5" (1.537 m)   She walked 500 feet on room air today and her O2 saturation was normal.  Gen: obese but well appearing, no acute distress HENT: NCAT, OP clear, neck supple without masses Eyes: PERRL, EOMi Lymph: no cervical lymphadenopathy PULM: CTA B CV: RRR, no mgr, no JVD GI: BS+, soft, nontender, no hsm Derm: no rash or skin breakdown MSK: normal bulk and tone Neuro: A&Ox4, CN II-XII intact, strength 5/5 in all 4 extremities Psyche: normal mood and affect   CBC    Component Value Date/Time   WBC 3.8 (L) 11/08/2015 1312   RBC 4.47 11/08/2015 1312   HGB 13.4 11/08/2015 1312   HCT 40.5 11/08/2015 1312   PLT 185.0 11/08/2015 1312   MCV 90.7 11/08/2015 1312   MCH 31.1 04/17/2011 1358   MCHC 33.2 11/08/2015 1312   RDW 14.5 11/08/2015 1312   LYMPHSABS 1.1 11/08/2015 1312   MONOABS 0.3 11/08/2015 1312   EOSABS 0.1 11/08/2015 1312   BASOSABS 0.0 11/08/2015 1312   BMET    Component Value Date/Time   NA 139 02/14/2017 1415   K 4.1 02/14/2017 1415   CL 103 02/14/2017 1415   CO2 31 02/14/2017 1415   GLUCOSE 90 02/14/2017 1415   GLUCOSE 93 07/16/2006 1501   BUN 17 02/14/2017 1415   CREATININE 0.67 02/14/2017 1415   CALCIUM 9.1 02/14/2017 1415   GFRNONAA 100.00 03/20/2010 1321   GFRAA 110 01/12/2008 1157   Records from her last visit with her primary care physician in August 2017 reviewed, she  was continued on simvastatin, levothyroxine, and anxiety treatment.  Chest imaging: July 2018 low-dose lung cancer screenings CT scan of the chest: Emphysema noted, RADS-2, images independently reviewed, mild centrilobular emphysema noted  Spirometry: October 2018 ratio normal, FEV1 2.01 L 97% predicted  Assessment & Plan:   Centrilobular emphysema ([place]) - Plan: Spirometry with Graph  Discussion:  Eilis has no airflow obstruction on her spirometry testing today so she does not have COPD. She does have mild centrilobular emphysema seen on the CT scan of her chest but we are reassured because she does not have recurrent respiratory infections nor does she have significant shortness of breath. I explained to her that she is at increased risk for severe respiratory infections because she has emphysema so she needs to make sure she protects her self with annual flu shots and good hand hygiene in the wintertime. However given her lack of symptoms there is no need to start inhaled medicine at this time. She can follow-up with [place] on an as-needed basis.    Plan: Centrilobular emphysema: I'm glad you have had both pneumonia vaccines Get a flu shot this year  Exercise as much as possible, stay active You are at increased risk for severe respiratory infections because of the emphysema so you need to make sure you wash your hands frequently when you are outside of the house    Current Outpatient Prescriptions:  .  ALPRAZolam (XANAX) 0.25 MG tablet, Take 1 tablet (0.25 mg total) by mouth 3 (three) times daily as needed for anxiety. (Patient taking differently: Take 0.25 mg by mouth as needed for anxiety. ), Disp: 60 tablet, Rfl: 0 .  aspirin EC 81 MG tablet, Take 1 tablet (81 mg total) by mouth daily., Disp: , Rfl:  .  escitalopram (LEXAPRO) 20 MG tablet, TAKE 1 TABLET BY MOUTH  DAILY, Disp: 90 tablet, Rfl: 0 .  ibuprofen (ADVIL,MOTRIN) 200 MG tablet, Take 200 mg by mouth 4 (four) times daily as  needed., Disp: , Rfl:  .  levothyroxine (SYNTHROID, LEVOTHROID) 100 MCG tablet, TAKE 1 TABLET BY MOUTH  DAILY BEFORE BREAKFAST, Disp: 90 tablet, Rfl: 0 .  loperamide (IMODIUM) 2 MG capsule, TAKE 1 CAPSULE BY MOUTH TWO TIMES DAILY, Disp: 180 capsule, Rfl: 0 .  multivitamin (THERAGRAN) per tablet, Take 1 tablet by mouth daily.  , Disp: , Rfl:  .  simvastatin (ZOCOR) 20 MG tablet, TAKE 1 TABLET BY MOUTH AT  BEDTIME, Disp: 90 tablet, Rfl: 0 .  Ubiquinol 100 MG CAPS, Take by mouth., Disp: , Rfl:

## 2017-05-01 NOTE — Patient Instructions (Signed)
Centrilobular emphysema: I'm glad you have had both pneumonia vaccines Get a flu shot this year  Exercise as much as possible, stay active You are at increased risk for severe respiratory infections because of the emphysema so you need to make sure you wash your hands frequently when you are outside of the house  We will see you back on an as needed basis

## 2017-05-01 NOTE — Progress Notes (Signed)
[place] patient 

## 2017-05-08 ENCOUNTER — Other Ambulatory Visit: Payer: Self-pay | Admitting: Family Medicine

## 2017-05-08 DIAGNOSIS — E785 Hyperlipidemia, unspecified: Secondary | ICD-10-CM

## 2017-05-09 ENCOUNTER — Other Ambulatory Visit: Payer: Self-pay | Admitting: Family Medicine

## 2017-08-02 ENCOUNTER — Other Ambulatory Visit: Payer: Self-pay | Admitting: Family Medicine

## 2017-08-19 ENCOUNTER — Other Ambulatory Visit: Payer: Self-pay | Admitting: Family Medicine

## 2017-09-13 ENCOUNTER — Encounter: Payer: Self-pay | Admitting: Family Medicine

## 2017-09-16 ENCOUNTER — Other Ambulatory Visit: Payer: Self-pay | Admitting: Family Medicine

## 2017-09-16 DIAGNOSIS — E039 Hypothyroidism, unspecified: Secondary | ICD-10-CM

## 2017-09-16 DIAGNOSIS — E785 Hyperlipidemia, unspecified: Secondary | ICD-10-CM

## 2017-09-16 NOTE — Telephone Encounter (Signed)
Orders in 

## 2017-10-26 ENCOUNTER — Encounter: Payer: Self-pay | Admitting: Family Medicine

## 2017-10-30 ENCOUNTER — Other Ambulatory Visit (HOSPITAL_BASED_OUTPATIENT_CLINIC_OR_DEPARTMENT_OTHER): Payer: Self-pay | Admitting: Obstetrics and Gynecology

## 2017-10-30 DIAGNOSIS — Z1231 Encounter for screening mammogram for malignant neoplasm of breast: Secondary | ICD-10-CM

## 2017-11-25 ENCOUNTER — Ambulatory Visit (HOSPITAL_BASED_OUTPATIENT_CLINIC_OR_DEPARTMENT_OTHER)
Admission: RE | Admit: 2017-11-25 | Discharge: 2017-11-25 | Disposition: A | Discharge status: Home / Self Care | Payer: 59 | Source: Ambulatory Visit | Attending: Obstetrics and Gynecology | Admitting: Obstetrics and Gynecology

## 2017-11-25 ENCOUNTER — Other Ambulatory Visit (INDEPENDENT_AMBULATORY_CARE_PROVIDER_SITE_OTHER): Payer: 59

## 2017-11-25 DIAGNOSIS — E039 Hypothyroidism, unspecified: Secondary | ICD-10-CM | POA: Diagnosis not present

## 2017-11-25 DIAGNOSIS — E785 Hyperlipidemia, unspecified: Secondary | ICD-10-CM

## 2017-11-25 DIAGNOSIS — Z1231 Encounter for screening mammogram for malignant neoplasm of breast: Secondary | ICD-10-CM | POA: Diagnosis present

## 2017-11-25 DIAGNOSIS — Z0000 Encounter for general adult medical examination without abnormal findings: Secondary | ICD-10-CM

## 2017-11-25 LAB — CBC WITH DIFFERENTIAL/PLATELET
BASOS ABS: 0 10*3/uL (ref 0.0–0.1)
Basophils Relative: 0.9 % (ref 0.0–3.0)
EOS PCT: 2.4 % (ref 0.0–5.0)
Eosinophils Absolute: 0.1 10*3/uL (ref 0.0–0.7)
HEMATOCRIT: 41.6 % (ref 36.0–46.0)
Hemoglobin: 13.9 g/dL (ref 12.0–15.0)
LYMPHS PCT: 25.1 % (ref 12.0–46.0)
Lymphs Abs: 0.9 10*3/uL (ref 0.7–4.0)
MCHC: 33.4 g/dL (ref 30.0–36.0)
MCV: 90.7 fl (ref 78.0–100.0)
MONOS PCT: 9.9 % (ref 3.0–12.0)
Monocytes Absolute: 0.4 10*3/uL (ref 0.1–1.0)
NEUTROS ABS: 2.3 10*3/uL (ref 1.4–7.7)
Neutrophils Relative %: 61.7 % (ref 43.0–77.0)
Platelets: 164 10*3/uL (ref 150.0–400.0)
RBC: 4.58 Mil/uL (ref 3.87–5.11)
RDW: 14.5 % (ref 11.5–15.5)
WBC: 3.7 10*3/uL — ABNORMAL LOW (ref 4.0–10.5)

## 2017-11-25 LAB — LIPID PANEL
CHOLESTEROL: 140 mg/dL (ref 0–200)
HDL: 59.9 mg/dL (ref >39.00)
LDL Cholesterol: 63 mg/dL (ref 0–99)
NonHDL: 79.61
TRIGLYCERIDES: 83 mg/dL (ref 0.0–149.0)
Total CHOL/HDL Ratio: 2
VLDL: 16.6 mg/dL (ref 0.0–40.0)

## 2017-11-25 LAB — COMPREHENSIVE METABOLIC PANEL
ALK PHOS: 73 U/L (ref 39–117)
ALT: 12 U/L (ref 0–35)
AST: 17 U/L (ref 0–37)
Albumin: 3.9 g/dL (ref 3.5–5.2)
BILIRUBIN TOTAL: 0.4 mg/dL (ref 0.2–1.2)
BUN: 12 mg/dL (ref 6–23)
CALCIUM: 8.9 mg/dL (ref 8.4–10.5)
CO2: 30 mEq/L (ref 19–32)
Chloride: 103 mEq/L (ref 96–112)
Creatinine, Ser: 0.67 mg/dL (ref 0.40–1.20)
GFR: 92.62 mL/min (ref >60.00)
Glucose, Bld: 97 mg/dL (ref 70–99)
Potassium: 4.2 mEq/L (ref 3.5–5.1)
Sodium: 140 mEq/L (ref 135–145)
TOTAL PROTEIN: 6.1 g/dL (ref 6.0–8.3)

## 2017-11-25 LAB — TSH: TSH: 3.48 u[IU]/mL (ref 0.35–4.50)

## 2017-11-28 ENCOUNTER — Encounter: Payer: Self-pay | Admitting: Family Medicine

## 2017-11-28 ENCOUNTER — Ambulatory Visit (INDEPENDENT_AMBULATORY_CARE_PROVIDER_SITE_OTHER): Payer: 59 | Admitting: Family Medicine

## 2017-11-28 VITALS — BP 143/70 | HR 67 | Temp 98.7°F | Resp 16 | Ht 61.5 in | Wt 258.4 lb

## 2017-11-28 DIAGNOSIS — E669 Obesity, unspecified: Secondary | ICD-10-CM

## 2017-11-28 DIAGNOSIS — E785 Hyperlipidemia, unspecified: Secondary | ICD-10-CM | POA: Diagnosis not present

## 2017-11-28 DIAGNOSIS — F419 Anxiety disorder, unspecified: Secondary | ICD-10-CM

## 2017-11-28 DIAGNOSIS — M170 Bilateral primary osteoarthritis of knee: Secondary | ICD-10-CM

## 2017-11-28 DIAGNOSIS — Z0000 Encounter for general adult medical examination without abnormal findings: Secondary | ICD-10-CM

## 2017-11-28 DIAGNOSIS — D729 Disorder of white blood cells, unspecified: Secondary | ICD-10-CM

## 2017-11-28 DIAGNOSIS — E039 Hypothyroidism, unspecified: Secondary | ICD-10-CM

## 2017-11-28 DIAGNOSIS — Z23 Encounter for immunization: Secondary | ICD-10-CM

## 2017-11-28 MED ORDER — DICLOFENAC SODIUM 1 % TD GEL: 4.0000 g | Freq: Four times a day (QID) | TRANSDERMAL | Status: DC

## 2017-11-28 MED ORDER — SERTRALINE HCL 50 MG PO TABS: 50.0000 mg | ORAL_TABLET | Freq: Every day | ORAL | 3 refills | Status: DC

## 2017-11-28 NOTE — Progress Notes (Signed)
Subjective:  I acted as a Education administrator for [employer]. Yancey Flemings, Ali Chukson   Patient ID: [name] [name], female    DOB: Apr 12, 1946, 72 y.o.   MRN: [id number]  Chief Complaint  Patient presents with  . Annual Exam    HPI  Patient is in today for annual exam.  Pt c/o anxiety worsening  She is also concerned about her weight and would like to get info on losing weight.    Patient Care Team: Carollee Herter, Alferd Apa, DO as PCP - General Leo Grosser Seymour Bars, MD as Consulting Physician (Obstetrics and Gynecology)   Past Medical History:  Diagnosis Date  . Allergic rhinitis   . Arthritis   . Cataract   . Depression   . Endometrial polyp   . Hyperlipidemia   . Hyperlipidemia    on simvastatin  . Hypothyroidism   . Obesity   . Seasonal allergies     Past Surgical History:  Procedure Laterality Date  . BUNIONECTOMY  2009   Dr.Duda, left  . EYE SURGERY    . HYSTEROSCOPY W/D&C  04/24/2011   Procedure: DILATATION AND CURETTAGE (D&C) /HYSTEROSCOPY;  Surgeon: Eldred Manges, MD;  Location: [facility];  Service: Gynecology;  Laterality: N/A;  . 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 PLANTAR FASCIA SURGERY  1994   Dr Percell Miller  . POLYPECTOMY  04/24/2011   Procedure: POLYPECTOMY;  Surgeon: Eldred Manges, MD;  Location: [facility];  Service: Gynecology;  Laterality: N/A;    Family History  Problem Relation Age of Onset  . Colon cancer Father 19  . Cancer Father 67       colon  . Pancreatic cancer Mother 23  . Cancer Mother 42       pancreatic  . Colon cancer Paternal Grandmother   . Cancer Paternal Grandmother 36       colon  . Heart attack Maternal Grandmother 50  . Heart disease Maternal Grandmother   . Cancer Sister 81       melanoma  . Breast cancer Sister   . Cancer Brother 7       skin  . Thyroid disease Sister   . Breast cancer Maternal Aunt 30  . Heart attack Maternal Aunt 60  . Breast cancer Sister     Social History   Socioeconomic History  . Marital  status: Married    Spouse name: Not on file  . Number of children: Not on file  . Years of education: Not on file  . Highest education level: Not on file  Occupational History  . Occupation: Daycare p/t  Social Needs  . Financial resource strain: Not on file  . Food insecurity:    Worry: Not on file    Inability: Not on file  . Transportation needs:    Medical: Not on file    Non-medical: Not on file  Tobacco Use  . Smoking status: Former Smoker    Packs/day: 2.00    Years: 42.00    Pack years: 84.00    Types: Cigarettes    Last attempt to quit: 07/28/2012    Years since quitting: 5.3  . Smokeless tobacco: Never Used  Substance and Sexual Activity  . Alcohol use: Yes    Alcohol/week: 0.0 oz    Comment: rare  . Drug use: No  . Sexual activity: Yes    Partners: Male    Birth control/protection: Post-menopausal  Lifestyle  . Physical activity:  Days per week: Not on file    Minutes per session: Not on file  . Stress: Not on file  Relationships  . Social connections:    Talks on phone: Not on file    Gets together: Not on file    Attends religious service: Not on file    Active member of club or organization: Not on file    Attends meetings of clubs or organizations: Not on file    Relationship status: Not on file  . Intimate partner violence:    Fear of current or ex partner: Not on file    Emotionally abused: Not on file    Physically abused: Not on file    Forced sexual activity: Not on file  Other Topics Concern  . Not on file  Social History Narrative   Regular exercise- no     Outpatient Medications Prior to Visit  Medication Sig Dispense Refill  . ALPRAZolam (XANAX) 0.25 MG tablet Take 1 tablet (0.25 mg total) by mouth 3 (three) times daily as needed for anxiety. (Patient taking differently: Take 0.25 mg by mouth as needed for anxiety. ) 60 tablet 0  . aspirin EC 81 MG tablet Take 1 tablet (81 mg total) by mouth daily.    Marland Kitchen ibuprofen (ADVIL,MOTRIN) 200  MG tablet Take 200 mg by mouth 4 (four) times daily as needed.    Marland Kitchen levothyroxine (SYNTHROID, LEVOTHROID) 100 MCG tablet TAKE 1 TABLET BY MOUTH  DAILY BEFORE BREAKFAST 90 tablet 0  . loperamide (IMODIUM) 2 MG capsule TAKE 1 CAPSULE BY MOUTH TWO TIMES DAILY 180 capsule 0  . multivitamin (THERAGRAN) per tablet Take 1 tablet by mouth daily.      . simvastatin (ZOCOR) 20 MG tablet TAKE 1 TABLET BY MOUTH AT  BEDTIME 90 tablet 0  . Ubiquinol 100 MG CAPS Take by mouth.    . escitalopram (LEXAPRO) 20 MG tablet TAKE 1 TABLET BY MOUTH  DAILY 90 tablet 0   No facility-administered medications prior to visit.     No Known Allergies  Review of Systems  Constitutional: Negative for chills, fever and malaise/fatigue.  HENT: Negative for congestion and hearing loss.   Eyes: Negative for blurred vision and discharge.  Respiratory: Negative for cough, sputum production and shortness of breath.   Cardiovascular: Negative for chest pain, palpitations and leg swelling.  Gastrointestinal: Negative for abdominal pain, blood in stool, constipation, diarrhea, heartburn, nausea and vomiting.  Genitourinary: Negative for dysuria, frequency, hematuria and urgency.  Musculoskeletal: Negative for back pain, falls and myalgias.  Skin: Negative for rash.  Neurological: Negative for dizziness, sensory change, loss of consciousness, weakness and headaches.  Endo/Heme/Allergies: Negative for environmental allergies. Does not bruise/bleed easily.  Psychiatric/Behavioral: Negative for depression and suicidal ideas. The patient is not nervous/anxious and does not have insomnia.        Objective:    Physical Exam  Constitutional: She is oriented to person, place, and time. She appears well-developed and well-nourished.  HENT:  Head: Normocephalic and atraumatic.  Eyes: Conjunctivae and EOM are normal.  Neck: Normal range of motion. Neck supple. No JVD present. Carotid bruit is not present. No thyromegaly present.    Cardiovascular: Normal rate, regular rhythm and normal heart sounds.  No murmur heard. Pulmonary/Chest: Effort normal and breath sounds normal. No respiratory distress. She has no wheezes. She has no rales. She exhibits no tenderness.  Genitourinary:  Genitourinary Comments: Deferred-- gyn  Musculoskeletal: She exhibits no edema.  Neurological: She is alert and  oriented to person, place, and time.  Psychiatric: She has a normal mood and affect.  Nursing note and vitals reviewed.   BP (!) 143/70 (BP Location: Left Arm, Patient Position: Sitting, Cuff Size: Large)   Pulse 67   Temp 98.7 F (37.1 C) (Oral)   Resp 16   Ht 5' 1.5" (1.562 m)   Wt 258 lb 6.4 oz (117.2 kg)   SpO2 97%   BMI 48.03 kg/m  Wt Readings from Last 3 Encounters:  11/28/17 258 lb 6.4 oz (117.2 kg)  05/01/17 262 lb (118.8 kg)  02/14/17 261 lb (118.4 kg)   BP Readings from Last 3 Encounters:  11/28/17 (!) 143/70  05/01/17 (!) 142/80  02/14/17 126/62     Immunization History  Administered Date(s) Administered  . Influenza Whole 05/30/2009, 05/29/2010, 06/20/2011, 05/30/2012, 05/15/2013  . Influenza, High Dose Seasonal PF 06/18/2016  . Influenza,inj,Quad PF,6+ Mos 04/28/2015  . Influenza-Unspecified 04/14/2014, 05/13/2017  . MMR 11/28/2017  . Pneumococcal Conjugate-13 07/23/2013  . Pneumococcal Polysaccharide-23 02/01/2013  . Td 01/17/2010  . Zoster 01/17/2010    Health Maintenance  Topic Date Due  . FOOT EXAM  05/28/1958  . OPHTHALMOLOGY EXAM  05/28/1958  . URINE MICROALBUMIN  02/01/2014  . HEMOGLOBIN A1C  08/17/2017  . PNA vac Low Risk Adult (2 of 2 - PPSV23) 02/01/2018  . INFLUENZA VACCINE  02/12/2018  . MAMMOGRAM  11/26/2019  . TETANUS/TDAP  01/18/2020  . COLONOSCOPY  04/16/2020  . DEXA SCAN  Completed  . Hepatitis C Screening  Completed    Lab Results  Component Value Date   WBC 3.7 (L) 11/25/2017   HGB 13.9 11/25/2017   HCT 41.6 11/25/2017   PLT 164.0 11/25/2017   GLUCOSE 97  11/25/2017   CHOL 140 11/25/2017   TRIG 83.0 11/25/2017   HDL 59.90 11/25/2017   LDLDIRECT 129.1 06/13/2011   LDLCALC 63 11/25/2017   ALT 12 11/25/2017   AST 17 11/25/2017   NA 140 11/25/2017   K 4.2 11/25/2017   CL 103 11/25/2017   CREATININE 0.67 11/25/2017   BUN 12 11/25/2017   CO2 30 11/25/2017   TSH 3.48 11/25/2017   HGBA1C 5.8 02/14/2017   MICROALBUR 0.2 02/01/2013    Lab Results  Component Value Date   TSH 3.48 11/25/2017   Lab Results  Component Value Date   WBC 3.7 (L) 11/25/2017   HGB 13.9 11/25/2017   HCT 41.6 11/25/2017   MCV 90.7 11/25/2017   PLT 164.0 11/25/2017   Lab Results  Component Value Date   NA 140 11/25/2017   K 4.2 11/25/2017   CO2 30 11/25/2017   GLUCOSE 97 11/25/2017   BUN 12 11/25/2017   CREATININE 0.67 11/25/2017   BILITOT 0.4 11/25/2017   ALKPHOS 73 11/25/2017   AST 17 11/25/2017   ALT 12 11/25/2017   PROT 6.1 11/25/2017   ALBUMIN 3.9 11/25/2017   CALCIUM 8.9 11/25/2017   GFR 92.62 11/25/2017   Lab Results  Component Value Date   CHOL 140 11/25/2017   Lab Results  Component Value Date   HDL 59.90 11/25/2017   Lab Results  Component Value Date   LDLCALC 63 11/25/2017   Lab Results  Component Value Date   TRIG 83.0 11/25/2017   Lab Results  Component Value Date   CHOLHDL 2 11/25/2017   Lab Results  Component Value Date   HGBA1C 5.8 02/14/2017         Assessment & Plan:   Problem List Items Addressed  This Visit      Unprioritized   Abnormal WBC count   Relevant Orders   CBC with Differential/Platelet   Anxiety   Relevant Medications   sertraline (ZOLOFT) 50 MG tablet   Hyperlipidemia LDL goal <100    Encouraged heart healthy diet, increase exercise, avoid trans fats, consider a krill oil cap daily      Hypothyroidism    Check labs       Obesity (BMI 30-39.9)    Refer to healthy weight and wellness       Preventative health care - Primary    ghm utd Check labs See AVS      Relevant  Orders   MMR vaccine subcutaneous (Completed)   Primary osteoarthritis of both knees   Relevant Medications   diclofenac sodium (VOLTAREN) 1 % transdermal gel 4 g    Other Visit Diagnoses    Morbid obesity ([place])       Relevant Orders   Amb Ref to Medical Weight Management      I have discontinued [name] [name]'s escitalopram. I am also having her start on sertraline. Additionally, I am having her maintain her multivitamin, Ubiquinol, ibuprofen, aspirin EC, ALPRAZolam, simvastatin, levothyroxine, and loperamide. We will continue to administer diclofenac sodium.  Meds ordered this encounter  Medications  . sertraline (ZOLOFT) 50 MG tablet    Sig: Take 1 tablet (50 mg total) by mouth daily.    Dispense:  30 tablet    Refill:  3  . diclofenac sodium (VOLTAREN) 1 % transdermal gel 4 g    CMA served as scribe during this visit. History, Physical and Plan performed by medical provider. Documentation and orders reviewed and attested to.  [name], DO

## 2017-11-28 NOTE — Patient Instructions (Signed)
Preventive Care 70 Years and Older, Female Preventive care refers to lifestyle choices and visits with your health care provider that can promote health and wellness. What does preventive care include?  A yearly physical exam. This is also called an annual well check.  Dental exams once or twice a year.  Routine eye exams. Ask your health care provider how often you should have your eyes checked.  Personal lifestyle choices, including: ? Daily care of your teeth and gums. ? Regular physical activity. ? Eating a healthy diet. ? Avoiding tobacco and drug use. ? Limiting alcohol use. ? Practicing safe sex. ? Taking low-dose aspirin every day. ? Taking vitamin and mineral supplements as recommended by your health care provider. What happens during an annual well check? The services and screenings done by your health care provider during your annual well check will depend on your age, overall health, lifestyle risk factors, and family history of disease. Counseling Your health care provider may ask you questions about your:  Alcohol use.  Tobacco use.  Drug use.  Emotional well-being.  Home and relationship well-being.  Sexual activity.  Eating habits.  History of falls.  Memory and ability to understand (cognition).  Work and work environment.  Reproductive health.  Screening You may have the following tests or measurements:  Height, weight, and BMI.  Blood pressure.  Lipid and cholesterol levels. These may be checked every 5 years, or more frequently if you are over 50 years old.  Skin check.  Lung cancer screening. You may have this screening every year starting at age 70 if you have a 30-pack-year history of smoking and currently smoke or have quit within the past 15 years.  Fecal occult blood test (FOBT) of the stool. You may have this test every year starting at age 70.  Flexible sigmoidoscopy or colonoscopy. You may have a sigmoidoscopy every 5 years or  a colonoscopy every 10 years starting at age 70.  Hepatitis C blood test.  Hepatitis B blood test.  Sexually transmitted disease (STD) testing.  Diabetes screening. This is done by checking your blood sugar (glucose) after you have not eaten for a while (fasting). You may have this done every 1-3 years.  Bone density scan. This is done to screen for osteoporosis. You may have this done starting at age 70.  Mammogram. This may be done every 1-2 years. Talk to your health care provider about how often you should have regular mammograms.  Talk with your health care provider about your test results, treatment options, and if necessary, the need for more tests. Vaccines Your health care provider may recommend certain vaccines, such as:  Influenza vaccine. This is recommended every year.  Tetanus, diphtheria, and acellular pertussis (Tdap, Td) vaccine. You may need a Td booster every 10 years.  Varicella vaccine. You may need this if you have not been vaccinated.  Zoster vaccine. You may need this after age 60.  Measles, mumps, and rubella (MMR) vaccine. You may need at least one dose of MMR if you were born in 1957 or later. You may also need a second dose.  Pneumococcal 13-valent conjugate (PCV13) vaccine. One dose is recommended after age 70.  Pneumococcal polysaccharide (PPSV23) vaccine. One dose is recommended after age 70.  Meningococcal vaccine. You may need this if you have certain conditions.  Hepatitis A vaccine. You may need this if you have certain conditions or if you travel or work in places where you may be exposed to hepatitis   A.  Hepatitis B vaccine. You may need this if you have certain conditions or if you travel or work in places where you may be exposed to hepatitis B.  Haemophilus influenzae type b (Hib) vaccine. You may need this if you have certain conditions.  Talk to your health care provider about which screenings and vaccines you need and how often you  need them. This information is not intended to replace advice given to you by your health care provider. Make sure you discuss any questions you have with your health care provider. Document Released: 07/28/2015 Document Revised: 03/20/2016 Document Reviewed: 05/02/2015 Elsevier Interactive Patient Education  2018 [employer]  

## 2017-11-30 ENCOUNTER — Encounter: Payer: Self-pay | Admitting: Family Medicine

## 2017-11-30 DIAGNOSIS — D729 Disorder of white blood cells, unspecified: Secondary | ICD-10-CM | POA: Insufficient documentation

## 2017-11-30 DIAGNOSIS — F419 Anxiety disorder, unspecified: Secondary | ICD-10-CM | POA: Insufficient documentation

## 2017-11-30 DIAGNOSIS — M170 Bilateral primary osteoarthritis of knee: Secondary | ICD-10-CM | POA: Insufficient documentation

## 2017-11-30 NOTE — Assessment & Plan Note (Signed)
Check labs 

## 2017-11-30 NOTE — Assessment & Plan Note (Signed)
-   Refer to healthy weight and wellness 

## 2017-11-30 NOTE — Assessment & Plan Note (Signed)
Encouraged heart healthy diet, increase exercise, avoid trans fats, consider a krill oil cap daily 

## 2017-11-30 NOTE — Assessment & Plan Note (Signed)
ghm utd Check labs See AVS 

## 2017-12-10 ENCOUNTER — Other Ambulatory Visit: Payer: Self-pay | Admitting: Family Medicine

## 2017-12-10 ENCOUNTER — Encounter: Payer: Self-pay | Admitting: Family Medicine

## 2017-12-10 DIAGNOSIS — E785 Hyperlipidemia, unspecified: Secondary | ICD-10-CM

## 2017-12-10 DIAGNOSIS — M170 Bilateral primary osteoarthritis of knee: Secondary | ICD-10-CM

## 2017-12-10 DIAGNOSIS — F419 Anxiety disorder, unspecified: Secondary | ICD-10-CM

## 2017-12-10 DIAGNOSIS — E039 Hypothyroidism, unspecified: Secondary | ICD-10-CM

## 2017-12-10 MED ORDER — LOPERAMIDE HCL 2 MG PO CAPS: ORAL_CAPSULE | ORAL | 1 refills | Status: DC

## 2017-12-10 MED ORDER — ALPRAZOLAM 0.25 MG PO TABS: 0.2500 mg | ORAL_TABLET | Freq: Three times a day (TID) | ORAL | 0 refills | Status: DC | PRN

## 2017-12-10 MED ORDER — DICLOFENAC SODIUM 1 % TD GEL: 4.0000 g | Freq: Four times a day (QID) | TRANSDERMAL | 3 refills | Status: DC

## 2017-12-10 MED ORDER — SIMVASTATIN 20 MG PO TABS: 20.0000 mg | ORAL_TABLET | Freq: Every day | ORAL | 0 refills | Status: DC

## 2017-12-10 MED ORDER — LEVOTHYROXINE SODIUM 100 MCG PO TABS: ORAL_TABLET | ORAL | 1 refills | Status: DC

## 2017-12-10 NOTE — Telephone Encounter (Signed)
Nothing on database  Last written: 02/19/16 Last ov: 11/28/17 Next ov: 03/03/18 Contract: none UDS: none  Can get uds and contract at august visit. Levothyroxine, loperamide, and simavastin already sent.    Do you want to send in gel for her knees to?

## 2017-12-21 ENCOUNTER — Other Ambulatory Visit: Payer: Self-pay | Admitting: Family Medicine

## 2017-12-21 DIAGNOSIS — M170 Bilateral primary osteoarthritis of knee: Secondary | ICD-10-CM

## 2017-12-22 ENCOUNTER — Encounter: Payer: Self-pay | Admitting: Family Medicine

## 2017-12-23 ENCOUNTER — Encounter: Payer: Self-pay | Admitting: Family Medicine

## 2017-12-23 ENCOUNTER — Other Ambulatory Visit: Payer: Self-pay

## 2017-12-23 DIAGNOSIS — F419 Anxiety disorder, unspecified: Secondary | ICD-10-CM

## 2017-12-23 MED ORDER — SERTRALINE HCL 50 MG PO TABS: 50.0000 mg | ORAL_TABLET | Freq: Every day | ORAL | 3 refills | Status: DC

## 2017-12-23 NOTE — Telephone Encounter (Signed)
You just want to do labs in 6 months?

## 2017-12-23 NOTE — Telephone Encounter (Signed)
As long as she has symptoms we can do ua---- a lot of ins are not paying for a urine just to do it.

## 2017-12-23 NOTE — Telephone Encounter (Signed)
Lab only fine

## 2017-12-23 NOTE — Telephone Encounter (Signed)
In patients last visit you said to return in 3 months and in labs you have to return in 6 months.  Which would you like to do.  It looks like she just expecting labs only.

## 2017-12-24 ENCOUNTER — Other Ambulatory Visit: Payer: Self-pay | Admitting: *Deleted

## 2017-12-24 DIAGNOSIS — D72819 Decreased white blood cell count, unspecified: Secondary | ICD-10-CM

## 2017-12-24 DIAGNOSIS — E039 Hypothyroidism, unspecified: Secondary | ICD-10-CM

## 2017-12-24 DIAGNOSIS — E785 Hyperlipidemia, unspecified: Secondary | ICD-10-CM

## 2017-12-30 NOTE — Telephone Encounter (Signed)
Wbc only slightly low Ok to wait 6 months

## 2018-01-07 ENCOUNTER — Other Ambulatory Visit: Payer: Self-pay | Admitting: Family Medicine

## 2018-01-19 ENCOUNTER — Other Ambulatory Visit: Payer: Self-pay | Admitting: *Deleted

## 2018-01-19 DIAGNOSIS — F419 Anxiety disorder, unspecified: Secondary | ICD-10-CM

## 2018-01-19 MED ORDER — SERTRALINE HCL 50 MG PO TABS: 50.0000 mg | ORAL_TABLET | Freq: Every day | ORAL | 0 refills | Status: DC

## 2018-01-24 ENCOUNTER — Other Ambulatory Visit: Payer: Self-pay | Admitting: Family Medicine

## 2018-01-24 DIAGNOSIS — M170 Bilateral primary osteoarthritis of knee: Secondary | ICD-10-CM

## 2018-01-28 ENCOUNTER — Other Ambulatory Visit: Payer: Self-pay | Admitting: *Deleted

## 2018-01-28 DIAGNOSIS — F419 Anxiety disorder, unspecified: Secondary | ICD-10-CM

## 2018-01-28 MED ORDER — SERTRALINE HCL 50 MG PO TABS: 50.0000 mg | ORAL_TABLET | Freq: Every day | ORAL | 1 refills | Status: DC

## 2018-01-30 ENCOUNTER — Other Ambulatory Visit: Payer: Self-pay | Admitting: Acute Care

## 2018-01-30 DIAGNOSIS — Z87891 Personal history of nicotine dependence: Secondary | ICD-10-CM

## 2018-02-02 ENCOUNTER — Encounter: Payer: Self-pay | Admitting: Family Medicine

## 2018-02-02 ENCOUNTER — Other Ambulatory Visit: Payer: Self-pay | Admitting: Family Medicine

## 2018-02-02 DIAGNOSIS — Z87891 Personal history of nicotine dependence: Secondary | ICD-10-CM

## 2018-02-02 NOTE — Telephone Encounter (Signed)
That is the only test I ordered --- make sure please that is all that is needed

## 2018-02-03 ENCOUNTER — Ambulatory Visit (HOSPITAL_BASED_OUTPATIENT_CLINIC_OR_DEPARTMENT_OTHER)
Admission: RE | Admit: 2018-02-03 | Discharge: 2018-02-03 | Disposition: A | Discharge status: Home / Self Care | Payer: 59 | Source: Ambulatory Visit | Attending: Acute Care | Admitting: Acute Care

## 2018-02-03 DIAGNOSIS — Z122 Encounter for screening for malignant neoplasm of respiratory organs: Secondary | ICD-10-CM | POA: Insufficient documentation

## 2018-02-03 DIAGNOSIS — Z87891 Personal history of nicotine dependence: Secondary | ICD-10-CM

## 2018-02-05 ENCOUNTER — Encounter (INDEPENDENT_AMBULATORY_CARE_PROVIDER_SITE_OTHER): Payer: 59

## 2018-02-06 ENCOUNTER — Ambulatory Visit: Payer: 59

## 2018-02-06 DIAGNOSIS — Z87891 Personal history of nicotine dependence: Secondary | ICD-10-CM

## 2018-02-10 ENCOUNTER — Other Ambulatory Visit: Payer: Self-pay | Admitting: Acute Care

## 2018-02-10 DIAGNOSIS — Z87891 Personal history of nicotine dependence: Secondary | ICD-10-CM

## 2018-02-10 DIAGNOSIS — Z122 Encounter for screening for malignant neoplasm of respiratory organs: Secondary | ICD-10-CM

## 2018-02-10 LAB — NICOTINE/COTININE SP
Cotinine: <2 ng/mL
Nicotine screen: <2 ng/mL

## 2018-02-17 ENCOUNTER — Other Ambulatory Visit: Payer: Self-pay | Admitting: Family Medicine

## 2018-02-17 ENCOUNTER — Encounter (INDEPENDENT_AMBULATORY_CARE_PROVIDER_SITE_OTHER): Payer: Self-pay | Admitting: Family Medicine

## 2018-02-17 ENCOUNTER — Ambulatory Visit (INDEPENDENT_AMBULATORY_CARE_PROVIDER_SITE_OTHER): Payer: 59 | Admitting: Family Medicine

## 2018-02-17 VITALS — BP 140/83 | HR 72 | Temp 97.6°F | Ht 60.0 in | Wt 253.0 lb

## 2018-02-17 DIAGNOSIS — M170 Bilateral primary osteoarthritis of knee: Secondary | ICD-10-CM

## 2018-02-17 DIAGNOSIS — E038 Other specified hypothyroidism: Secondary | ICD-10-CM | POA: Diagnosis not present

## 2018-02-17 DIAGNOSIS — R0602 Shortness of breath: Secondary | ICD-10-CM

## 2018-02-17 DIAGNOSIS — Z1331 Encounter for screening for depression: Secondary | ICD-10-CM

## 2018-02-17 DIAGNOSIS — E66813 Obesity, class 3: Secondary | ICD-10-CM

## 2018-02-17 DIAGNOSIS — Z6841 Body Mass Index (BMI) 40.0 and over, adult: Secondary | ICD-10-CM | POA: Diagnosis not present

## 2018-02-17 DIAGNOSIS — R5383 Other fatigue: Secondary | ICD-10-CM | POA: Diagnosis not present

## 2018-02-17 DIAGNOSIS — Z0289 Encounter for other administrative examinations: Secondary | ICD-10-CM

## 2018-02-18 LAB — INSULIN, RANDOM: INSULIN: 4.2 u[IU]/mL (ref 2.6–24.9)

## 2018-02-18 LAB — TSH: TSH: 6.93 u[IU]/mL — ABNORMAL HIGH (ref 0.450–4.500)

## 2018-02-18 LAB — HEMOGLOBIN A1C
ESTIMATED AVERAGE GLUCOSE: 117 mg/dL
HEMOGLOBIN A1C: 5.7 % — AB (ref 4.8–5.6)

## 2018-02-18 LAB — T3: T3 TOTAL: 86 ng/dL (ref 71–180)

## 2018-02-18 LAB — VITAMIN D 25 HYDROXY (VIT D DEFICIENCY, FRACTURES): VIT D 25 HYDROXY: 24.2 ng/mL — AB (ref 30.0–100.0)

## 2018-02-18 LAB — T4, FREE: FREE T4: 1.54 ng/dL (ref 0.82–1.77)

## 2018-02-18 NOTE — Progress Notes (Signed)
Office: [phone]  /  Fax: [phone]   Dear Dr. Carollee Herter,   Thank you for referring [name] [name] to our clinic. The following note includes my evaluation and treatment recommendations.  HPI:   Chief Complaint: OBESITY    [name] [name] has been referred by [name], DO for consultation regarding her obesity and obesity related comorbidities.    [name] [name] (MR# [id number]) is a 72 y.o. female who presents on 02/18/2018 for obesity evaluation and treatment. Current BMI is Body mass index is 49.41 kg/m.[name] [name] has been struggling with her weight for many years and has been unsuccessful in either losing weight, maintaining weight loss, or reaching her healthy weight goal.     [name] [name] attended our information session and states she is currently in the action stage of change and ready to dedicate time achieving and maintaining a healthier weight. [name] [name] is interested in becoming our patient and working on intensive lifestyle modifications including (but not limited to) diet, exercise and weight loss.    [name] [name] states her family eats meals together she thinks her family will eat healthier with  her her desired weight loss is 118 lbs she has been heavy most of  her life she started gaining weight after age 54 her heaviest weight ever was 267 lbs. she has significant food cravings issues  she snacks frequently in the evenings she skips meals frequently she frequently eats larger portions than normal  she struggles with emotional eating    Fatigue [name] [name] feels her energy is lower than it should be. This has worsened with weight gain and has not worsened recently. Mileydi admits to daytime somnolence and admits to waking up still tired. Patient is at risk for obstructive sleep apnea. [name] [name] has a history of symptoms of daytime fatigue and morning fatigue. Patient generally gets 10 hours of sleep per night, and states they generally have restful sleep. Snoring is not  present. Apneic episodes are not present. Epworth Sleepiness Score is 6  EKG was ordered today and shows poor R wave progression.  Dyspnea on exertion [name] [name] notes increasing shortness of breath with exercising and seems to be worsening over time with weight gain. She notes getting out of breath sooner with activity than she used to. This has not gotten worse recently. EKG was ordered today and shows poor R wave progression. [name] [name] denies orthopnea.  Hypothyroid [name] [name] has a diagnosis of hypothyroidism. She is on levothyroxine. She denies hot or cold intolerance or palpitations, but does admit to ongoing fatigue.  Depression Screen [name] [name] Food and Mood (modified PHQ-9) score was  Depression screen PHQ 2/9 02/17/2018  Decreased Interest 3  Down, Depressed, Hopeless 3  PHQ - 2 Score 6  Altered sleeping 3  Tired, decreased energy 3  Change in appetite 2  Feeling bad or failure about yourself  3  Trouble concentrating 0  Moving slowly or fidgety/restless 3  Suicidal thoughts 0  PHQ-9 Score 20  Difficult doing work/chores Very difficult    ALLERGIES: No Known Allergies  MEDICATIONS: Current Outpatient Medications on File Prior to Visit  Medication Sig Dispense Refill  . diclofenac sodium (VOLTAREN) 1 % GEL APPLY TO AFFECTED AREA(S)  TOPICALLY 4 GRAMS 4 TIMES  DAILY 400 g 0  . levothyroxine (SYNTHROID, LEVOTHROID) 100 MCG tablet TAKE 1 TABLET BY MOUTH  DAILY BEFORE BREAKFAST 90 tablet 1  . loperamide (IMODIUM) 2 MG capsule TAKE 1 CAPSULE BY MOUTH TWO TIMES DAILY 180 capsule 1  . multivitamin (THERAGRAN)  per tablet Take 1 tablet by mouth daily.      . sertraline (ZOLOFT) 50 MG tablet Take 1 tablet (50 mg total) by mouth daily. 90 tablet 1  . simvastatin (ZOCOR) 20 MG tablet Take 1 tablet (20 mg total) by mouth at bedtime. 90 tablet 0  . Ubiquinol 100 MG CAPS Take by mouth.     Current Facility-Administered Medications on File Prior to Visit  Medication Dose Route Frequency Provider  Last Rate Last Dose  . diclofenac sodium (VOLTAREN) 1 % transdermal gel 4 g  4 g Topical QID [name], DO        PAST MEDICAL HISTORY: Past Medical History:  Diagnosis Date  . Allergic rhinitis   . Arthritis   . Cataract   . Depression   . Emphysema lung (HCC)    Early stage  . Endometrial polyp   . Hyperlipidemia   . Hyperlipidemia    on simvastatin  . Hypothyroidism   . IBS (irritable bowel syndrome)   . Obesity   . Osteoarthritis   . Seasonal allergies     PAST SURGICAL HISTORY: Past Surgical History:  Procedure Laterality Date  . BUNIONECTOMY  2009   Dr.Duda, left  . EYE SURGERY    . HYSTEROSCOPY W/D&C  04/24/2011   Procedure: DILATATION AND CURETTAGE (D&C) /HYSTEROSCOPY;  Surgeon: Eldred Manges, MD;  Location: [facility];  Service: Gynecology;  Laterality: N/A;  . 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 PLANTAR FASCIA SURGERY  1994   Dr Percell Miller  . POLYPECTOMY  04/24/2011   Procedure: POLYPECTOMY;  Surgeon: Eldred Manges, MD;  Location: [facility];  Service: Gynecology;  Laterality: N/A;    SOCIAL HISTORY: Social History   Tobacco Use  . Smoking status: Former Smoker    Packs/day: 2.00    Years: 42.00    Pack years: 84.00    Types: Cigarettes    Last attempt to quit: 07/28/2012    Years since quitting: 5.5  . Smokeless tobacco: Never Used  Substance Use Topics  . Alcohol use: Yes    Comment: rare  . Drug use: No    FAMILY HISTORY: Family History  Problem Relation Age of Onset  . Colon cancer Father 61  . Cancer Father 3       colon  . Alcoholism Father   . Pancreatic cancer Mother 14  . Cancer Mother 65       pancreatic  . Colon cancer Paternal Grandmother   . Cancer Paternal Grandmother 63       colon  . Heart attack Maternal Grandmother 50  . Heart disease Maternal Grandmother   . Cancer Sister 68       melanoma  . Breast cancer Sister   . Cancer Brother 78       skin  . Thyroid disease Sister   . Breast  cancer Maternal Aunt 30  . Heart attack Maternal Aunt 60  . Breast cancer Sister     ROS: Review of Systems  Constitutional: Positive for malaise/fatigue.  HENT: Positive for tinnitus.   Eyes:       Wear Glasses or Contacts  Respiratory: Positive for shortness of breath.   Cardiovascular: Negative for palpitations and orthopnea.       Positive for Shortness of Breath with activity  Gastrointestinal: Positive for diarrhea.  Musculoskeletal: Positive for neck pain.       Muscle or Joint Pain  Skin:  Dryness   Endo/Heme/Allergies:       Negative for heat or cold intolerance  Psychiatric/Behavioral: Positive for depression.    PHYSICAL EXAM: Blood pressure 140/83, pulse 72, temperature 97.6 F (36.4 C), temperature source Oral, height 5' (1.524 m), weight 253 lb (114.8 kg), SpO2 94 %. Body mass index is 49.41 kg/m. Physical Exam  Constitutional: She is oriented to person, place, and time. She appears well-developed and well-nourished.  HENT:  Head: Normocephalic and atraumatic.  Nose: Nose normal.  Eyes: EOM are normal. No scleral icterus.  Neck: Normal range of motion. Neck supple. No thyromegaly present.  Cardiovascular: Normal rate and regular rhythm.  Pulmonary/Chest: Effort normal. No respiratory distress.  Abdominal: Soft. There is no tenderness.  + obesity  Musculoskeletal: Normal range of motion.  Range of Motion normal in all 4 extremities  Neurological: She is alert and oriented to person, place, and time. Coordination normal.  Skin: Skin is warm and dry.  Psychiatric: She has a normal mood and affect.  Vitals reviewed.   RECENT LABS AND TESTS: BMET    Component Value Date/Time   NA 140 11/25/2017 1325   K 4.2 11/25/2017 1325   CL 103 11/25/2017 1325   CO2 30 11/25/2017 1325   GLUCOSE 97 11/25/2017 1325   GLUCOSE 93 07/16/2006 1501   BUN 12 11/25/2017 1325   CREATININE 0.67 11/25/2017 1325   CALCIUM 8.9 11/25/2017 1325   GFRNONAA 100.00  03/20/2010 1321   GFRAA 110 01/12/2008 1157   Lab Results  Component Value Date   HGBA1C 5.7 (H) 02/17/2018   Lab Results  Component Value Date   INSULIN 4.2 02/17/2018   CBC    Component Value Date/Time   WBC 3.7 (L) 11/25/2017 1325   RBC 4.58 11/25/2017 1325   HGB 13.9 11/25/2017 1325   HCT 41.6 11/25/2017 1325   PLT 164.0 11/25/2017 1325   MCV 90.7 11/25/2017 1325   MCH 31.1 04/17/2011 1358   MCHC 33.4 11/25/2017 1325   RDW 14.5 11/25/2017 1325   LYMPHSABS 0.9 11/25/2017 1325   MONOABS 0.4 11/25/2017 1325   EOSABS 0.1 11/25/2017 1325   BASOSABS 0.0 11/25/2017 1325   Iron/TIBC/Ferritin/ %Sat No results found for: IRON, TIBC, FERRITIN, IRONPCTSAT Lipid Panel     Component Value Date/Time   CHOL 140 11/25/2017 1325   TRIG 83.0 11/25/2017 1325   HDL 59.90 11/25/2017 1325   CHOLHDL 2 11/25/2017 1325   VLDL 16.6 11/25/2017 1325   LDLCALC 63 11/25/2017 1325   LDLDIRECT 129.1 06/13/2011 1020   Hepatic Function Panel     Component Value Date/Time   PROT 6.1 11/25/2017 1325   ALBUMIN 3.9 11/25/2017 1325   AST 17 11/25/2017 1325   ALT 12 11/25/2017 1325   ALKPHOS 73 11/25/2017 1325   BILITOT 0.4 11/25/2017 1325   BILIDIR 0.1 12/23/2014 1031      Component Value Date/Time   TSH 6.930 (H) 02/17/2018 1113   TSH 3.48 11/25/2017 1325   TSH 3.09 11/08/2015 1312    ECG  shows NSR with a rate of 66 BPM INDIRECT CALORIMETER done today shows a VO2 of 241 and a REE of 1678.  Her calculated basal metabolic rate is 4580 thus her basal metabolic rate is better than expected.    ASSESSMENT AND PLAN: Other fatigue - Plan: EKG 12-Lead, Hemoglobin A1c, Insulin, random, VITAMIN D 25 Hydroxy (Vit-D Deficiency, Fractures)  Shortness of breath on exertion  Other specified hypothyroidism - Plan: T3, T4, free, TSH  Depression  screening  Class 3 severe obesity with serious comorbidity and body mass index (BMI) of 45.0 to 49.9 in adult, unspecified obesity type  ([place])  PLAN: Fatigue Lizanne was informed that her fatigue may be related to obesity, depression or many other causes. Labs will be ordered, and in the meanwhile Ming has agreed to work on diet, exercise and weight loss to help with fatigue. Proper sleep hygiene was discussed including the need for 7-8 hours of quality sleep each night. A sleep study was not ordered based on symptoms and Epworth score. We will order EKG and indirect calorimetry today.  Dyspnea on exertion [name] [name]'s shortness of breath appears to be obesity related and exercise induced. She has agreed to work on weight loss and gradually increase exercise to treat her exercise induced shortness of breath. If [name] [name] follows our instructions and loses weight without improvement of her shortness of breath, we will plan to refer to pulmonology.  We will order EKG and indirect calorimetry today. We will order labs and will monitor this condition regularly. [name] [name] agrees to this plan.  Hypothyroid [name] [name] was informed of the importance of good thyroid control to help with weight loss efforts. She was also informed that supertheraputic thyroid levels are dangerous and will not improve weight loss results. We will order thyroid panel today and [name] [name] will follow up with our clinic in 2 weeks.  Depression Screen [name] [name] had a strongly positive depression screening. Depression is commonly associated with obesity and often results in emotional eating behaviors. We will monitor this closely and work on CBT to help improve the non-hunger eating patterns. Referral to Psychology may be required if no improvement is seen as she continues in our clinic.  Obesity [name] [name] is currently in the action stage of change and her goal is to continue with weight loss efforts. I recommend [name] [name] begin the structured treatment plan as follows:  She has agreed to follow the Category 2 plan [name] [name] has been instructed to eventually work up to a goal of 150 minutes  of combined cardio and strengthening exercise per week for weight loss and overall health benefits. We discussed the following Behavioral Modification Strategies today: no skipping meals, increasing lean protein intake, increasing vegetables and work on meal planning and easy cooking plans   She was informed of the importance of frequent follow up visits to maximize her success with intensive lifestyle modifications for her multiple health conditions. She was informed we would discuss her lab results at her next visit unless there is a critical issue that needs to be addressed sooner. Kierstin agreed to keep her next visit at the agreed upon time to discuss these results.    OBESITY BEHAVIORAL INTERVENTION VISIT  Today's visit was # 1 out of 22.  Starting weight: 253 lbs Starting date: 02/17/18 Today's weight : 253 lbs  Today's date: 02/17/2018 Total lbs lost to date: 0 (Patients must lose 7 lbs in the first 6 months to continue with counseling)   ASK: We discussed the diagnosis of obesity with Sherol Dade today and Alea agreed to give [place] permission to discuss obesity behavioral modification therapy today.  ASSESS: Elenora has the diagnosis of obesity and her BMI today is 49.41 Alivia is in the action stage of change   ADVISE: Sequita was educated on the multiple health risks of obesity as well as the benefit of weight loss to improve her health. She was advised of the need for long term treatment and the importance of lifestyle  modifications.  AGREE: Multiple dietary modification options and treatment options were discussed and  Tinleigh agreed to the above obesity treatment plan.   I, Doreene Nest, am acting as transcriptionist for  Eber Jones, MD  I have reviewed the above documentation for accuracy and completeness, and I agree with the above. - Ilene Qua, MD

## 2018-03-01 ENCOUNTER — Other Ambulatory Visit: Payer: Self-pay | Admitting: Family Medicine

## 2018-03-01 DIAGNOSIS — M170 Bilateral primary osteoarthritis of knee: Secondary | ICD-10-CM

## 2018-03-03 ENCOUNTER — Ambulatory Visit (INDEPENDENT_AMBULATORY_CARE_PROVIDER_SITE_OTHER): Payer: 59 | Admitting: Family Medicine

## 2018-03-03 ENCOUNTER — Ambulatory Visit: Payer: 59 | Admitting: Family Medicine

## 2018-03-03 VITALS — BP 119/81 | HR 93 | Temp 98.3°F | Ht 60.0 in | Wt 246.0 lb

## 2018-03-03 DIAGNOSIS — E559 Vitamin D deficiency, unspecified: Secondary | ICD-10-CM | POA: Diagnosis not present

## 2018-03-03 DIAGNOSIS — Z9189 Other specified personal risk factors, not elsewhere classified: Secondary | ICD-10-CM | POA: Diagnosis not present

## 2018-03-03 DIAGNOSIS — E038 Other specified hypothyroidism: Secondary | ICD-10-CM

## 2018-03-03 DIAGNOSIS — Z6841 Body Mass Index (BMI) 40.0 and over, adult: Secondary | ICD-10-CM

## 2018-03-03 DIAGNOSIS — R7303 Prediabetes: Secondary | ICD-10-CM | POA: Diagnosis not present

## 2018-03-03 MED ORDER — LEVOTHYROXINE SODIUM 112 MCG PO TABS: 112.0000 ug | ORAL_TABLET | Freq: Every day | ORAL | 0 refills | Status: DC

## 2018-03-03 MED ORDER — METFORMIN HCL 500 MG PO TABS: 500.0000 mg | ORAL_TABLET | Freq: Every day | ORAL | 0 refills | Status: DC

## 2018-03-03 MED ORDER — VITAMIN D (ERGOCALCIFEROL) 1.25 MG (50000 UNIT) PO CAPS: 50000.0000 [IU] | ORAL_CAPSULE | ORAL | 0 refills | Status: DC

## 2018-03-03 NOTE — Progress Notes (Signed)
Office: [phone]  /  Fax: [phone]   HPI:   Chief Complaint: OBESITY [name] [name] is here to discuss her progress with her obesity treatment plan. She is on the Category 2 plan and is following her eating plan approximately 80 % of the time. She states she is exercising 0 minutes 0 times per week. [name] [name] went out to eat three times over the last two weeks. She denies any hunger. [name] [name] wants to figure out how to incorporate eating out. Her weight is 246 lb (111.6 kg) today and has had a weight loss of 7 pounds over a period of 2 weeks since her last visit. She has lost 7 lbs since starting treatment with [place].  Vitamin D deficiency [name] [name] has a diagnosis of vitamin D deficiency. [name] [name] is not on OTC vit D currently, and she denies nausea, vomiting or muscle weakness.  Pre-Diabetes [name] [name] has a diagnosis of prediabetes based on her elevated Hgb A1c. Her insulin level is at 4.2. She has been prediabetic for the past eight years. She was informed this puts her at greater risk of developing diabetes. She is not taking metformin currently and continues to work on diet and exercise to decrease risk of diabetes. She admits to carb cravings and denies nausea or hypoglycemia.  At risk for diabetes [name] [name] is at higher than average risk for developing diabetes due to her obesity and prediabetes. She currently denies polyuria or polydipsia.  Hypothyroidism [name] [name] has a diagnosis of hypothyroidism. She is on 100 mcg of levothyroxine. She denies hot or cold intolerance, but she does admit to ongoing fatigue.  ALLERGIES: No Known Allergies  MEDICATIONS: Current Outpatient Medications on File Prior to Visit  Medication Sig Dispense Refill  . diclofenac sodium (VOLTAREN) 1 % GEL APPLY 4 GRAMS TO AFFECTED  AREA(S) TOPICALLY 4 TIMES  DAILY 400 g 0  . loperamide (IMODIUM) 2 MG capsule TAKE 1 CAPSULE BY MOUTH TWO TIMES DAILY 180 capsule 1  . multivitamin (THERAGRAN) per tablet Take 1 tablet by mouth  daily.      . sertraline (ZOLOFT) 50 MG tablet Take 1 tablet (50 mg total) by mouth daily. 90 tablet 1  . simvastatin (ZOCOR) 20 MG tablet Take 1 tablet (20 mg total) by mouth at bedtime. 90 tablet 0  . Ubiquinol 100 MG CAPS Take by mouth.     Current Facility-Administered Medications on File Prior to Visit  Medication Dose Route Frequency Provider Last Rate Last Dose  . diclofenac sodium (VOLTAREN) 1 % transdermal gel 4 g  4 g Topical QID [name], DO        PAST MEDICAL HISTORY: Past Medical History:  Diagnosis Date  . Allergic rhinitis   . Arthritis   . Cataract   . Depression   . Emphysema lung (HCC)    Early stage  . Endometrial polyp   . Hyperlipidemia   . Hyperlipidemia    on simvastatin  . Hypothyroidism   . IBS (irritable bowel syndrome)   . Obesity   . Osteoarthritis   . Seasonal allergies     PAST SURGICAL HISTORY: Past Surgical History:  Procedure Laterality Date  . BUNIONECTOMY  2009   Dr.Duda, left  . EYE SURGERY    . HYSTEROSCOPY W/D&C  04/24/2011   Procedure: DILATATION AND CURETTAGE (D&C) /HYSTEROSCOPY;  Surgeon: Eldred Manges, MD;  Location: [facility];  Service: Gynecology;  Laterality: N/A;  . 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 PLANTAR  FASCIA SURGERY  1994   Dr Percell Miller  . POLYPECTOMY  04/24/2011   Procedure: POLYPECTOMY;  Surgeon: Eldred Manges, MD;  Location: [facility];  Service: Gynecology;  Laterality: N/A;    SOCIAL HISTORY: Social History   Tobacco Use  . Smoking status: Former Smoker    Packs/day: 2.00    Years: 42.00    Pack years: 84.00    Types: Cigarettes    Last attempt to quit: 07/28/2012    Years since quitting: 5.6  . Smokeless tobacco: Never Used  Substance Use Topics  . Alcohol use: Yes    Comment: rare  . Drug use: No    FAMILY HISTORY: Family History  Problem Relation Age of Onset  . Colon cancer Father 37  . Cancer Father 38       colon  . Alcoholism Father   . Pancreatic cancer  Mother 40  . Cancer Mother 66       pancreatic  . Colon cancer Paternal Grandmother   . Cancer Paternal Grandmother 37       colon  . Heart attack Maternal Grandmother 50  . Heart disease Maternal Grandmother   . Cancer Sister 60       melanoma  . Breast cancer Sister   . Cancer Brother 34       skin  . Thyroid disease Sister   . Breast cancer Maternal Aunt 30  . Heart attack Maternal Aunt 60  . Breast cancer Sister     ROS: Review of Systems  Constitutional: Positive for malaise/fatigue and weight loss.  Gastrointestinal: Negative for nausea and vomiting.  Genitourinary: Negative for frequency.  Musculoskeletal:       Negative for muscle weakness   Endo/Heme/Allergies: Negative for polydipsia.       Positive for carb cravings Negative for hypoglycemia Negative for polyphagia Negative for heat or cold intolerance    PHYSICAL EXAM: Blood pressure 119/81, pulse 93, temperature 98.3 F (36.8 C), temperature source Oral, height 5' (1.524 m), weight 246 lb (111.6 kg), SpO2 96 %. Body mass index is 48.04 kg/m. Physical Exam  Constitutional: She is oriented to person, place, and time. She appears well-developed and well-nourished.  Cardiovascular: Normal rate.  Pulmonary/Chest: Effort normal.  Musculoskeletal: Normal range of motion.  Neurological: She is oriented to person, place, and time.  Skin: Skin is warm and dry.  Psychiatric: She has a normal mood and affect. Her behavior is normal.  Vitals reviewed.   RECENT LABS AND TESTS: BMET    Component Value Date/Time   NA 140 11/25/2017 1325   K 4.2 11/25/2017 1325   CL 103 11/25/2017 1325   CO2 30 11/25/2017 1325   GLUCOSE 97 11/25/2017 1325   GLUCOSE 93 07/16/2006 1501   BUN 12 11/25/2017 1325   CREATININE 0.67 11/25/2017 1325   CALCIUM 8.9 11/25/2017 1325   GFRNONAA 100.00 03/20/2010 1321   GFRAA 110 01/12/2008 1157   Lab Results  Component Value Date   HGBA1C 5.7 (H) 02/17/2018   HGBA1C 5.8 02/14/2017     HGBA1C 5.5 01/23/2015   HGBA1C 5.7 02/24/2014   HGBA1C 5.8 10/21/2013   Lab Results  Component Value Date   INSULIN 4.2 02/17/2018   CBC    Component Value Date/Time   WBC 3.7 (L) 11/25/2017 1325   RBC 4.58 11/25/2017 1325   HGB 13.9 11/25/2017 1325   HCT 41.6 11/25/2017 1325   PLT 164.0 11/25/2017 1325   MCV 90.7 11/25/2017 1325   MCH  31.1 04/17/2011 1358   MCHC 33.4 11/25/2017 1325   RDW 14.5 11/25/2017 1325   LYMPHSABS 0.9 11/25/2017 1325   MONOABS 0.4 11/25/2017 1325   EOSABS 0.1 11/25/2017 1325   BASOSABS 0.0 11/25/2017 1325   Iron/TIBC/Ferritin/ %Sat No results found for: IRON, TIBC, FERRITIN, IRONPCTSAT Lipid Panel     Component Value Date/Time   CHOL 140 11/25/2017 1325   TRIG 83.0 11/25/2017 1325   HDL 59.90 11/25/2017 1325   CHOLHDL 2 11/25/2017 1325   VLDL 16.6 11/25/2017 1325   LDLCALC 63 11/25/2017 1325   LDLDIRECT 129.1 06/13/2011 1020   Hepatic Function Panel     Component Value Date/Time   PROT 6.1 11/25/2017 1325   ALBUMIN 3.9 11/25/2017 1325   AST 17 11/25/2017 1325   ALT 12 11/25/2017 1325   ALKPHOS 73 11/25/2017 1325   BILITOT 0.4 11/25/2017 1325   BILIDIR 0.1 12/23/2014 1031      Component Value Date/Time   TSH 6.930 (H) 02/17/2018 1113   TSH 3.48 11/25/2017 1325   TSH 3.09 11/08/2015 1312   Results for TRULEE, HAMSTRA (MRN [id number]) as of 03/03/2018 15:13  Ref. Range 02/17/2018 11:13  Vitamin D, 25-Hydroxy Latest Ref Range: 30.0 - 100.0 ng/mL 24.2 (L)   ASSESSMENT AND PLAN: Vitamin D deficiency - Plan: Vitamin D, Ergocalciferol, (DRISDOL) 50000 units CAPS capsule  Prediabetes - Plan: metFORMIN (GLUCOPHAGE) 500 MG tablet  Other specified hypothyroidism - Plan: levothyroxine (SYNTHROID, LEVOTHROID) 112 MCG tablet  At risk for diabetes mellitus  Class 3 severe obesity with serious comorbidity and body mass index (BMI) of 45.0 to 49.9 in adult, unspecified obesity type ([name] [name])  PLAN:  Vitamin D Deficiency Ninnie was informed  that low vitamin D levels contributes to fatigue and are associated with obesity, breast, and colon cancer. She agrees to continue to take prescription Vit D @50 ,000 IU every week #4 with no refills and will follow up for routine testing of vitamin D, at least 2-3 times per year. She was informed of the risk of over-replacement of vitamin D and agrees to not increase her dose unless she discusses this with [place] first. [name] [name] agrees to follow up as directed.  Pre-Diabetes [name] [name] will continue to work on weight loss, exercise, and decreasing simple carbohydrates in her diet to help decrease the risk of diabetes. We dicussed metformin including benefits and risks. She was informed that eating too many simple carbohydrates or too many calories at one sitting increases the likelihood of GI side effects. [name] [name] agreed to start  metformin for now and a prescription was written today for metformin 500 mg qAM #30 with no refills. [name] [name] agreed to follow up with [place] as directed to monitor her progress.  Diabetes risk counseling [name] [name] was given extended (30 minutes) diabetes prevention counseling today. She is 72 y.o. female and has risk factors for diabetes including obesity. We discussed intensive lifestyle modifications today with an emphasis on weight loss as well as increasing exercise and decreasing simple carbohydrates in her diet.  Hypothyroidism [name] [name] was informed of the importance of good thyroid control to help with weight loss efforts. She was also informed that supertheraputic thyroid levels are dangerous and will not improve weight loss results. [name] [name] agrees to start levothyroxine 112 mcg qAM #30 with no refills and follow up as directed.  Obesity [name] [name] is currently in the action stage of change. As such, her goal is to continue with weight loss efforts She has agreed to follow the Category 2 plan [name] [name]  has been instructed to work up to a goal of 150 minutes of combined cardio and strengthening  exercise per week for weight loss and overall health benefits. We discussed the following Behavioral Modification Strategies today: planning for success, keep a strict food journal, increasing lean protein intake, increasing vegetables and work on meal planning and easy cooking plans  Gussie has agreed to follow up with our clinic in 2 weeks. She was informed of the importance of frequent follow up visits to maximize her success with intensive lifestyle modifications for her multiple health conditions.   OBESITY BEHAVIORAL INTERVENTION VISIT  Today's visit was # 2   Starting weight: 253 lbs Starting date: 02/17/18 Today's weight : 246 lbs  Today's date: 03/03/2018 Total lbs lost to date: 7 At least 15 minutes were spent on discussing the following behavioral intervention visit.   ASK: We discussed the diagnosis of obesity with Sherol Dade today and Nima agreed to give [place] permission to discuss obesity behavioral modification therapy today.  ASSESS: Jamielyn has the diagnosis of obesity and her BMI today is 48.04 Keesha is in the action stage of change   ADVISE: Monzerat was educated on the multiple health risks of obesity as well as the benefit of weight loss to improve her health. She was advised of the need for long term treatment and the importance of lifestyle modifications to improve her current health and to decrease her risk of future health problems.  AGREE: Multiple dietary modification options and treatment options were discussed and  Shephanie agreed to follow the recommendations documented in the above note.  ARRANGE: Eilee was educated on the importance of frequent visits to treat obesity as outlined per CMS and USPSTF guidelines and agreed to schedule her next follow up appointment today.  I, Doreene Nest, am acting as Location manager for Eber Jones  I have reviewed the above documentation for accuracy and completeness, and I agree with the above. -  Ilene Qua, MD

## 2018-03-04 ENCOUNTER — Encounter (INDEPENDENT_AMBULATORY_CARE_PROVIDER_SITE_OTHER): Payer: Self-pay

## 2018-03-18 ENCOUNTER — Ambulatory Visit (INDEPENDENT_AMBULATORY_CARE_PROVIDER_SITE_OTHER): Payer: 59 | Admitting: Family Medicine

## 2018-03-18 VITALS — BP 117/79 | HR 65 | Temp 97.9°F | Ht 60.0 in | Wt 242.0 lb

## 2018-03-18 DIAGNOSIS — R7303 Prediabetes: Secondary | ICD-10-CM | POA: Diagnosis not present

## 2018-03-18 DIAGNOSIS — Z9189 Other specified personal risk factors, not elsewhere classified: Secondary | ICD-10-CM

## 2018-03-18 DIAGNOSIS — E559 Vitamin D deficiency, unspecified: Secondary | ICD-10-CM

## 2018-03-18 DIAGNOSIS — E038 Other specified hypothyroidism: Secondary | ICD-10-CM

## 2018-03-18 DIAGNOSIS — Z6841 Body Mass Index (BMI) 40.0 and over, adult: Secondary | ICD-10-CM

## 2018-03-18 MED ORDER — VITAMIN D (ERGOCALCIFEROL) 1.25 MG (50000 UNIT) PO CAPS: 50000.0000 [IU] | ORAL_CAPSULE | ORAL | 0 refills | Status: DC

## 2018-03-18 MED ORDER — METFORMIN HCL 500 MG PO TABS: 500.0000 mg | ORAL_TABLET | Freq: Every day | ORAL | 0 refills | Status: DC

## 2018-03-18 MED ORDER — LEVOTHYROXINE SODIUM 112 MCG PO TABS: 112.0000 ug | ORAL_TABLET | Freq: Every day | ORAL | 0 refills | Status: DC

## 2018-03-18 NOTE — Progress Notes (Signed)
Office: [phone]  /  Fax: [phone]   HPI:   Chief Complaint: OBESITY Lestine is here to discuss her progress with her obesity treatment plan. She is on the Category 2 plan and is following her eating plan approximately 90 % of the time. She states she is exercising 0 minutes 0 times per week. Jacyln is starting to feel slightly disappointed with her 11 pound weight loss for the month. Her knees are feeling better and she is fitting into clothes she hasn't worn for a while. Her weight is 242 lb (109.8 kg) today and has had a weight loss of 4 pounds over a period of 2 weeks since her last visit. She has lost 11 lbs since starting treatment with [place].  Vitamin D deficiency Ghadeer has a diagnosis of vitamin D deficiency. She is currently taking prescription vitamin D. Fatigue is improving and she denies nausea, vomiting or muscle weakness.  Pre-Diabetes Kennetha has a diagnosis of prediabetes based on her elevated Hgb A1c and was informed this puts her at greater risk of developing diabetes. She is taking metformin currently and she denies any GI side effects. Shaniah continues to work on diet and exercise to decrease risk of diabetes. She denies hypoglycemia.  At risk for diabetes Liel is at higher than average risk for developing diabetes due to her obesity and prediabetes. She currently denies polyuria or polydipsia.  Hypothyroidism Fayrene has a diagnosis of hypothyroidism. She is on levothyroxine. She denies hot or cold intolerance or palpitations, but does admit to ongoing fatigue.  ALLERGIES: No Known Allergies  MEDICATIONS: Current Outpatient Medications on File Prior to Visit  Medication Sig Dispense Refill  . diclofenac sodium (VOLTAREN) 1 % GEL APPLY 4 GRAMS TO AFFECTED  AREA(S) TOPICALLY 4 TIMES  DAILY 400 g 0  . loperamide (IMODIUM) 2 MG capsule TAKE 1 CAPSULE BY MOUTH TWO TIMES DAILY 180 capsule 1  . multivitamin (THERAGRAN) per tablet Take 1 tablet by mouth daily.        . sertraline (ZOLOFT) 50 MG tablet Take 1 tablet (50 mg total) by mouth daily. 90 tablet 1  . simvastatin (ZOCOR) 20 MG tablet Take 1 tablet (20 mg total) by mouth at bedtime. 90 tablet 0  . Ubiquinol 100 MG CAPS Take by mouth.     Current Facility-Administered Medications on File Prior to Visit  Medication Dose Route Frequency Provider Last Rate Last Dose  . diclofenac sodium (VOLTAREN) 1 % transdermal gel 4 g  4 g Topical QID [name], DO        PAST MEDICAL HISTORY: Past Medical History:  Diagnosis Date  . Allergic rhinitis   . Arthritis   . Cataract   . Depression   . Emphysema lung (HCC)    Early stage  . Endometrial polyp   . Hyperlipidemia   . Hyperlipidemia    on simvastatin  . Hypothyroidism   . IBS (irritable bowel syndrome)   . Obesity   . Osteoarthritis   . Seasonal allergies     PAST SURGICAL HISTORY: Past Surgical History:  Procedure Laterality Date  . BUNIONECTOMY  2009   Dr.Duda, left  . EYE SURGERY    . HYSTEROSCOPY W/D&C  04/24/2011   Procedure: DILATATION AND CURETTAGE (D&C) /HYSTEROSCOPY;  Surgeon: Eldred Manges, MD;  Location: [facility];  Service: Gynecology;  Laterality: N/A;  . 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 PLANTAR FASCIA SURGERY  1994   Dr Percell Miller  .  POLYPECTOMY  04/24/2011   Procedure: POLYPECTOMY;  Surgeon: Eldred Manges, MD;  Location: [facility];  Service: Gynecology;  Laterality: N/A;    SOCIAL HISTORY: Social History   Tobacco Use  . Smoking status: Former Smoker    Packs/day: 2.00    Years: 42.00    Pack years: 84.00    Types: Cigarettes    Last attempt to quit: 07/28/2012    Years since quitting: 5.6  . Smokeless tobacco: Never Used  Substance Use Topics  . Alcohol use: Yes    Comment: rare  . Drug use: No    FAMILY HISTORY: Family History  Problem Relation Age of Onset  . Colon cancer Father 20  . Cancer Father 44       colon  . Alcoholism Father   . Pancreatic cancer Mother 15   . Cancer Mother 26       pancreatic  . Colon cancer Paternal Grandmother   . Cancer Paternal Grandmother 68       colon  . Heart attack Maternal Grandmother 50  . Heart disease Maternal Grandmother   . Cancer Sister 44       melanoma  . Breast cancer Sister   . Cancer Brother 42       skin  . Thyroid disease Sister   . Breast cancer Maternal Aunt 30  . Heart attack Maternal Aunt 60  . Breast cancer Sister     ROS: Review of Systems  Constitutional: Positive for malaise/fatigue and weight loss.  Cardiovascular: Negative for palpitations.  Gastrointestinal: Negative for diarrhea, nausea and vomiting.  Genitourinary: Negative for frequency.  Musculoskeletal:       Negative for muscle weakness  Endo/Heme/Allergies: Negative for polydipsia.       Negative for hypoglycemia Negative for heat or cold intolerance    PHYSICAL EXAM: Blood pressure 117/79, pulse 65, temperature 97.9 F (36.6 C), temperature source Oral, height 5' (1.524 m), weight 242 lb (109.8 kg), SpO2 95 %. Body mass index is 47.26 kg/m. Physical Exam  Constitutional: She is oriented to person, place, and time. She appears well-developed and well-nourished.  Cardiovascular: Normal rate.  Pulmonary/Chest: Effort normal.  Musculoskeletal: Normal range of motion.  Neurological: She is oriented to person, place, and time.  Skin: Skin is warm and dry.  Psychiatric: She has a normal mood and affect. Her behavior is normal.  Vitals reviewed.   RECENT LABS AND TESTS: BMET    Component Value Date/Time   NA 140 11/25/2017 1325   K 4.2 11/25/2017 1325   CL 103 11/25/2017 1325   CO2 30 11/25/2017 1325   GLUCOSE 97 11/25/2017 1325   GLUCOSE 93 07/16/2006 1501   BUN 12 11/25/2017 1325   CREATININE 0.67 11/25/2017 1325   CALCIUM 8.9 11/25/2017 1325   GFRNONAA 100.00 03/20/2010 1321   GFRAA 110 01/12/2008 1157   Lab Results  Component Value Date   HGBA1C 5.7 (H) 02/17/2018   HGBA1C 5.8 02/14/2017   HGBA1C  5.5 01/23/2015   HGBA1C 5.7 02/24/2014   HGBA1C 5.8 10/21/2013   Lab Results  Component Value Date   INSULIN 4.2 02/17/2018   CBC    Component Value Date/Time   WBC 3.7 (L) 11/25/2017 1325   RBC 4.58 11/25/2017 1325   HGB 13.9 11/25/2017 1325   HCT 41.6 11/25/2017 1325   PLT 164.0 11/25/2017 1325   MCV 90.7 11/25/2017 1325   MCH 31.1 04/17/2011 1358   MCHC 33.4 11/25/2017 1325   RDW 14.5  11/25/2017 1325   LYMPHSABS 0.9 11/25/2017 1325   MONOABS 0.4 11/25/2017 1325   EOSABS 0.1 11/25/2017 1325   BASOSABS 0.0 11/25/2017 1325   Iron/TIBC/Ferritin/ %Sat No results found for: IRON, TIBC, FERRITIN, IRONPCTSAT Lipid Panel     Component Value Date/Time   CHOL 140 11/25/2017 1325   TRIG 83.0 11/25/2017 1325   HDL 59.90 11/25/2017 1325   CHOLHDL 2 11/25/2017 1325   VLDL 16.6 11/25/2017 1325   LDLCALC 63 11/25/2017 1325   LDLDIRECT 129.1 06/13/2011 1020   Hepatic Function Panel     Component Value Date/Time   PROT 6.1 11/25/2017 1325   ALBUMIN 3.9 11/25/2017 1325   AST 17 11/25/2017 1325   ALT 12 11/25/2017 1325   ALKPHOS 73 11/25/2017 1325   BILITOT 0.4 11/25/2017 1325   BILIDIR 0.1 12/23/2014 1031      Component Value Date/Time   TSH 6.930 (H) 02/17/2018 1113   TSH 3.48 11/25/2017 1325   TSH 3.09 11/08/2015 1312   Results for SHANTI, AGRESTI (MRN [id number]) as of 03/18/2018 14:26  Ref. Range 02/17/2018 11:13  Vitamin D, 25-Hydroxy Latest Ref Range: 30.0 - 100.0 ng/mL 24.2 (L)   ASSESSMENT AND PLAN: Vitamin D deficiency - Plan: Vitamin D, Ergocalciferol, (DRISDOL) 50000 units CAPS capsule  Prediabetes - Plan: metFORMIN (GLUCOPHAGE) 500 MG tablet  Other specified hypothyroidism - Plan: levothyroxine (SYNTHROID, LEVOTHROID) 112 MCG tablet  At risk for diabetes mellitus  Class 3 severe obesity with serious comorbidity and body mass index (BMI) of 45.0 to 49.9 in adult, unspecified obesity type ([place])  PLAN:  Vitamin D Deficiency Keyaira was informed that low  vitamin D levels contributes to fatigue and are associated with obesity, breast, and colon cancer. She agrees to continue to take prescription Vit D @50 ,000 IU every week #4 with no refills and will follow up for routine testing of vitamin D, at least 2-3 times per year. She was informed of the risk of over-replacement of vitamin D and agrees to not increase her dose unless she discusses this with [place] first. Bethanie agrees to follow up as directed.  Pre-Diabetes Azaliyah will continue to work on weight loss, exercise, and decreasing simple carbohydrates in her diet to help decrease the risk of diabetes. We dicussed metformin including benefits and risks. She was informed that eating too many simple carbohydrates or too many calories at one sitting increases the likelihood of GI side effects. Karman agreed to continue metformin for now and a prescription was written today for 1 month refill. Loida agreed to follow up with [place] as directed to monitor her progress.  Diabetes risk counseling Trenee was given extended (15 minutes) diabetes prevention counseling today. She is 72 y.o. female and has risk factors for diabetes including obesity and prediabetes. We discussed intensive lifestyle modifications today with an emphasis on weight loss as well as increasing exercise and decreasing simple carbohydrates in her diet.  Hypothyroidism Rudean was informed of the importance of good thyroid control to help with weight loss efforts. She was also informed that supertheraputic thyroid levels are dangerous and will not improve weight loss results. Adriana agrees to continue levothyroxine 112 mcg qAM #30 with no refills and follow up as directed.  Obesity Jermani is currently in the action stage of change. As such, her goal is to continue with weight loss efforts She has agreed to follow the Category 2 plan Makena has been instructed to work up to a goal of 150 minutes of combined cardio and strengthening  exercise per  week for weight loss and overall health benefits. We discussed the following Behavioral Modification Strategies today: planning for success, increasing vegetables, decrease eating out and work on meal planning and easy cooking plans  Paytin has agreed to follow up with our clinic in 2 weeks. She was informed of the importance of frequent follow up visits to maximize her success with intensive lifestyle modifications for her multiple health conditions.   OBESITY BEHAVIORAL INTERVENTION VISIT  Today's visit was # 3   Starting weight: 253 lbs Starting date: 02/17/18 Today's weight : 242 lbs Today's date: 03/18/2018 Total lbs lost to date: 11   ASK: We discussed the diagnosis of obesity with Sherol Dade today and Rosann Auerbach agreed to give [place] permission to discuss obesity behavioral modification therapy today.  ASSESS: Avenell has the diagnosis of obesity and her BMI today is 47.26 Zerah is in the action stage of change   ADVISE: Jowanna was educated on the multiple health risks of obesity as well as the benefit of weight loss to improve her health. She was advised of the need for long term treatment and the importance of lifestyle modifications to improve her current health and to decrease her risk of future health problems.  AGREE: Multiple dietary modification options and treatment options were discussed and  Mackensi agreed to follow the recommendations documented in the above note.  ARRANGE: Ellean was educated on the importance of frequent visits to treat obesity as outlined per CMS and USPSTF guidelines and agreed to schedule her next follow up appointment today.  I, Doreene Nest, am acting as transcriptionist for Eber Jones, MD  I have reviewed the above documentation for accuracy and completeness, and I agree with the above. - Ilene Qua, MD

## 2018-04-02 ENCOUNTER — Ambulatory Visit (INDEPENDENT_AMBULATORY_CARE_PROVIDER_SITE_OTHER): Payer: 59 | Admitting: Family Medicine

## 2018-04-02 VITALS — BP 112/73 | HR 68 | Temp 97.6°F | Ht 60.0 in | Wt 238.0 lb

## 2018-04-02 DIAGNOSIS — R7303 Prediabetes: Secondary | ICD-10-CM

## 2018-04-02 DIAGNOSIS — E559 Vitamin D deficiency, unspecified: Secondary | ICD-10-CM | POA: Diagnosis not present

## 2018-04-02 DIAGNOSIS — Z6841 Body Mass Index (BMI) 40.0 and over, adult: Secondary | ICD-10-CM

## 2018-04-03 ENCOUNTER — Other Ambulatory Visit: Payer: Self-pay | Admitting: Family Medicine

## 2018-04-03 DIAGNOSIS — M170 Bilateral primary osteoarthritis of knee: Secondary | ICD-10-CM

## 2018-04-06 NOTE — Progress Notes (Signed)
Office: [phone]  /  Fax: [phone]   HPI:   Chief Complaint: OBESITY [name] [name] is here to discuss her progress with her obesity treatment plan. She is on the  follow the Category 2 plan and is following her eating plan approximately 80 % of the time. She states she is exercising 0 minutes 0 times per week. [name] [name] is feeling much better, but is having some GI cramps. She is still going out to eat, but making smarter choices when going out.  Her weight is 238 lb (108 kg) today and has had a weight loss of 4 pounds over a period of 2 weeks since her last visit. She has lost 15 lbs since starting treatment with [place].  Vitamin D deficiency [name] [name] has a diagnosis of vitamin D deficiency. She is currently taking vit D and denies nausea, vomiting or muscle weakness.   Ref. Range 02/17/2018 11:13  Vitamin D, 25-Hydroxy Latest Ref Range: 30.0 - 100.0 ng/mL 24.2 (L)   Pre-Diabetes [name] [name] has a diagnosis of prediabetes based on her elevated HgA1c and was informed this puts her at greater risk of developing diabetes. She is taking metformin currently and is experiencing some occasional GI symptoms such as abdominal cramping. She continues to work on diet and exercise to decrease risk of diabetes. She denies nausea or hypoglycemia.  ALLERGIES: No Known Allergies  MEDICATIONS: Current Outpatient Medications on File Prior to Visit  Medication Sig Dispense Refill  . levothyroxine (SYNTHROID, LEVOTHROID) 112 MCG tablet Take 1 tablet (112 mcg total) by mouth daily. 30 tablet 0  . loperamide (IMODIUM) 2 MG capsule TAKE 1 CAPSULE BY MOUTH TWO TIMES DAILY 180 capsule 1  . metFORMIN (GLUCOPHAGE) 500 MG tablet Take 1 tablet (500 mg total) by mouth daily with breakfast. 30 tablet 0  . multivitamin (THERAGRAN) per tablet Take 1 tablet by mouth daily.      . sertraline (ZOLOFT) 50 MG tablet Take 1 tablet (50 mg total) by mouth daily. 90 tablet 1  . simvastatin (ZOCOR) 20 MG tablet Take 1 tablet (20 mg total)  by mouth at bedtime. 90 tablet 0  . Ubiquinol 100 MG CAPS Take by mouth.    . Vitamin D, Ergocalciferol, (DRISDOL) 50000 units CAPS capsule Take 1 capsule (50,000 Units total) by mouth every 7 (seven) days. 4 capsule 0   Current Facility-Administered Medications on File Prior to Visit  Medication Dose Route Frequency Provider Last Rate Last Dose  . diclofenac sodium (VOLTAREN) 1 % transdermal gel 4 g  4 g Topical QID [name], DO        PAST MEDICAL HISTORY: Past Medical History:  Diagnosis Date  . Allergic rhinitis   . Arthritis   . Cataract   . Depression   . Emphysema lung (HCC)    Early stage  . Endometrial polyp   . Hyperlipidemia   . Hyperlipidemia    on simvastatin  . Hypothyroidism   . IBS (irritable bowel syndrome)   . Obesity   . Osteoarthritis   . Seasonal allergies     PAST SURGICAL HISTORY: Past Surgical History:  Procedure Laterality Date  . BUNIONECTOMY  2009   Dr.Duda, left  . EYE SURGERY    . HYSTEROSCOPY W/D&C  04/24/2011   Procedure: DILATATION AND CURETTAGE (D&C) /HYSTEROSCOPY;  Surgeon: Eldred Manges, MD;  Location: [facility];  Service: Gynecology;  Laterality: N/A;  . 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 PLANTAR FASCIA SURGERY  1994   Dr Percell Miller  . POLYPECTOMY  04/24/2011   Procedure: POLYPECTOMY;  Surgeon: Eldred Manges, MD;  Location: [facility];  Service: Gynecology;  Laterality: N/A;    SOCIAL HISTORY: Social History   Tobacco Use  . Smoking status: Former Smoker    Packs/day: 2.00    Years: 42.00    Pack years: 84.00    Types: Cigarettes    Last attempt to quit: 07/28/2012    Years since quitting: 5.6  . Smokeless tobacco: Never Used  Substance Use Topics  . Alcohol use: Yes    Comment: rare  . Drug use: No    FAMILY HISTORY: Family History  Problem Relation Age of Onset  . Colon cancer Father 42  . Cancer Father 66       colon  . Alcoholism Father   . Pancreatic cancer Mother 50  . Cancer  Mother 14       pancreatic  . Colon cancer Paternal Grandmother   . Cancer Paternal Grandmother 52       colon  . Heart attack Maternal Grandmother 50  . Heart disease Maternal Grandmother   . Cancer Sister 41       melanoma  . Breast cancer Sister   . Cancer Brother 30       skin  . Thyroid disease Sister   . Breast cancer Maternal Aunt 30  . Heart attack Maternal Aunt 60  . Breast cancer Sister     ROS: Review of Systems  Constitutional: Positive for weight loss.  Gastrointestinal: Negative for nausea and vomiting.  Musculoskeletal:       Negative for muscle weakness   Endo/Heme/Allergies:       Negative for hypoglycemia     PHYSICAL EXAM: Blood pressure 112/73, pulse 68, temperature 97.6 F (36.4 C), temperature source Oral, height 5' (1.524 m), weight 238 lb (108 kg), SpO2 95 %. Body mass index is 46.48 kg/m. Physical Exam  Constitutional: She is oriented to person, place, and time. She appears well-developed and well-nourished.  HENT:  Head: Normocephalic.  Cardiovascular: Normal rate.  Musculoskeletal: Normal range of motion.  Neurological: She is oriented to person, place, and time.  Skin: Skin is warm and dry.  Psychiatric: She has a normal mood and affect. Her behavior is normal.  Vitals reviewed.   RECENT LABS AND TESTS: BMET    Component Value Date/Time   NA 140 11/25/2017 1325   K 4.2 11/25/2017 1325   CL 103 11/25/2017 1325   CO2 30 11/25/2017 1325   GLUCOSE 97 11/25/2017 1325   GLUCOSE 93 07/16/2006 1501   BUN 12 11/25/2017 1325   CREATININE 0.67 11/25/2017 1325   CALCIUM 8.9 11/25/2017 1325   GFRNONAA 100.00 03/20/2010 1321   GFRAA 110 01/12/2008 1157   Lab Results  Component Value Date   HGBA1C 5.7 (H) 02/17/2018   HGBA1C 5.8 02/14/2017   HGBA1C 5.5 01/23/2015   HGBA1C 5.7 02/24/2014   HGBA1C 5.8 10/21/2013   Lab Results  Component Value Date   INSULIN 4.2 02/17/2018   CBC    Component Value Date/Time   WBC 3.7 (L) 11/25/2017  1325   RBC 4.58 11/25/2017 1325   HGB 13.9 11/25/2017 1325   HCT 41.6 11/25/2017 1325   PLT 164.0 11/25/2017 1325   MCV 90.7 11/25/2017 1325   MCH 31.1 04/17/2011 1358   MCHC 33.4 11/25/2017 1325   RDW 14.5 11/25/2017 1325   LYMPHSABS 0.9 11/25/2017 1325   MONOABS 0.4  11/25/2017 1325   EOSABS 0.1 11/25/2017 1325   BASOSABS 0.0 11/25/2017 1325   Iron/TIBC/Ferritin/ %Sat No results found for: IRON, TIBC, FERRITIN, IRONPCTSAT Lipid Panel     Component Value Date/Time   CHOL 140 11/25/2017 1325   TRIG 83.0 11/25/2017 1325   HDL 59.90 11/25/2017 1325   CHOLHDL 2 11/25/2017 1325   VLDL 16.6 11/25/2017 1325   LDLCALC 63 11/25/2017 1325   LDLDIRECT 129.1 06/13/2011 1020   Hepatic Function Panel     Component Value Date/Time   PROT 6.1 11/25/2017 1325   ALBUMIN 3.9 11/25/2017 1325   AST 17 11/25/2017 1325   ALT 12 11/25/2017 1325   ALKPHOS 73 11/25/2017 1325   BILITOT 0.4 11/25/2017 1325   BILIDIR 0.1 12/23/2014 1031      Component Value Date/Time   TSH 6.930 (H) 02/17/2018 1113   TSH 3.48 11/25/2017 1325   TSH 3.09 11/08/2015 1312     Ref. Range 02/17/2018 11:13  Vitamin D, 25-Hydroxy Latest Ref Range: 30.0 - 100.0 ng/mL 24.2 (L)   ASSESSMENT AND PLAN: Vitamin D deficiency  Prediabetes  Class 3 severe obesity with serious comorbidity and body mass index (BMI) of 45.0 to 49.9 in adult, unspecified obesity type ([place])  PLAN: Vitamin D Deficiency Norissa was informed that low vitamin D levels contributes to fatigue and are associated with obesity, breast, and colon cancer. She agrees to continue to take prescription Vit D @50 ,000 IU every week and will follow up for routine testing of vitamin D, at least 2-3 times per year. She was informed of the risk of over-replacement of vitamin D and agrees to not increase her dose unless she discusses this with [place] first.  Pre-Diabetes [name] [name] will continue to work on weight loss, exercise, and decreasing simple carbohydrates in her  diet to help decrease the risk of diabetes. We dicussed metformin including benefits and risks. She was informed that eating too many simple carbohydrates or too many calories at one sitting increases the likelihood of GI side effects. Skyeler agrees to continue Metformin. [name] [name] agreed to follow up with [place] as directed to monitor her progress.  I spent > than 50% of the 15 minute visit on counseling as documented in the note.  Obesity [name] [name] is currently in the action stage of change. As such, her goal is to continue with weight loss efforts She has agreed to follow the Category 2 plan [name] [name] has been instructed to work up to a goal of 150 minutes of combined cardio and strengthening exercise per week for weight loss and overall health benefits. We discussed the following Behavioral Modification Strategies today: increasing lean protein intake, increasing vegetables and work on meal planning,  easy cooking plans, and planning for success.    [name] [name] has agreed to follow up with our clinic in 2 weeks. She was informed of the importance of frequent follow up visits to maximize her success with intensive lifestyle modifications for her multiple health conditions.   OBESITY BEHAVIORAL INTERVENTION VISIT  Today's visit was # 4   Starting weight: 253 lb Starting date: 02/17/18 Today's weight : 238 lb Today's date: 04/02/18 Total lbs lost to date: 15 lb    ASK: We discussed the diagnosis of obesity with [name] [name] today and [name] [name] agreed to give [place] permission to discuss obesity behavioral modification therapy today.  ASSESS: [name] [name] has the diagnosis of obesity and her BMI today is 46.48 [name] [name] is in the action stage of change   ADVISE: Uri was  educated on the multiple health risks of obesity as well as the benefit of weight loss to improve her health. She was advised of the need for long term treatment and the importance of lifestyle modifications to improve her current health and to  decrease her risk of future health problems.  AGREE: Multiple dietary modification options and treatment options were discussed and  [name] [name] agreed to follow the recommendations documented in the above note.  ARRANGE: [name] [name] was educated on the importance of frequent visits to treat obesity as outlined per CMS and USPSTF guidelines and agreed to schedule her next follow up appointment today.  I, Renee Ramus, am acting as transcriptionist for Ilene Qua, MD   I have reviewed the above documentation for accuracy and completeness, and I agree with the above. - Ilene Qua, MD

## 2018-04-10 ENCOUNTER — Other Ambulatory Visit: Payer: Self-pay | Admitting: Family Medicine

## 2018-04-10 DIAGNOSIS — M170 Bilateral primary osteoarthritis of knee: Secondary | ICD-10-CM

## 2018-04-16 ENCOUNTER — Ambulatory Visit (INDEPENDENT_AMBULATORY_CARE_PROVIDER_SITE_OTHER): Payer: 59 | Admitting: Family Medicine

## 2018-04-16 VITALS — BP 141/72 | HR 68 | Temp 97.7°F | Ht 60.0 in | Wt 236.0 lb

## 2018-04-16 DIAGNOSIS — Z6841 Body Mass Index (BMI) 40.0 and over, adult: Secondary | ICD-10-CM | POA: Diagnosis not present

## 2018-04-16 DIAGNOSIS — E559 Vitamin D deficiency, unspecified: Secondary | ICD-10-CM | POA: Diagnosis not present

## 2018-04-16 DIAGNOSIS — R7303 Prediabetes: Secondary | ICD-10-CM

## 2018-04-20 NOTE — Progress Notes (Signed)
Office: [phone]  /  Fax: [phone]   HPI:   Chief Complaint: OBESITY [name] [name] is here to discuss her progress with her obesity treatment plan. She is on the Category 2 plan and is following her eating plan approximately 85-90 % of the time. She states she is exercising 0 minutes 0 times per week. [name] [name] may not have gotten all food in a few times, mostly in terms of protein at meals.  Her weight is 236 lb (107 kg) today and has had a weight loss of 2 pounds over a period of 2 weeks since her last visit. She has lost 17 lbs since starting treatment with [place].  Pre-Diabetes [name] [name] has a diagnosis of pre-diabetes based on her elevated Hgb A1c and was informed this puts her at greater risk of developing diabetes. She denies GI side effects of metformin and denies hypoglycemia. She continues to work on diet and exercise to decrease risk of diabetes.  Vitamin D Deficiency [name] [name] has a diagnosis of vitamin D deficiency. She is currently taking prescription Vit D. She notes fatigue and denies nausea, vomiting or muscle weakness.  ALLERGIES: No Known Allergies  MEDICATIONS: Current Outpatient Medications on File Prior to Visit  Medication Sig Dispense Refill  . diclofenac sodium (VOLTAREN) 1 % GEL Apply 4 g topically 4 (four) times daily. 400 g 0  . levothyroxine (SYNTHROID, LEVOTHROID) 112 MCG tablet Take 1 tablet (112 mcg total) by mouth daily. 30 tablet 0  . loperamide (IMODIUM) 2 MG capsule TAKE 1 CAPSULE BY MOUTH TWO TIMES DAILY 180 capsule 1  . metFORMIN (GLUCOPHAGE) 500 MG tablet Take 1 tablet (500 mg total) by mouth daily with breakfast. 30 tablet 0  . multivitamin (THERAGRAN) per tablet Take 1 tablet by mouth daily.      . sertraline (ZOLOFT) 50 MG tablet Take 1 tablet (50 mg total) by mouth daily. 90 tablet 1  . simvastatin (ZOCOR) 20 MG tablet Take 1 tablet (20 mg total) by mouth at bedtime. 90 tablet 0  . Ubiquinol 100 MG CAPS Take by mouth.    . Vitamin D, Ergocalciferol,  (DRISDOL) 50000 units CAPS capsule Take 1 capsule (50,000 Units total) by mouth every 7 (seven) days. 4 capsule 0   Current Facility-Administered Medications on File Prior to Visit  Medication Dose Route Frequency Provider Last Rate Last Dose  . diclofenac sodium (VOLTAREN) 1 % transdermal gel 4 g  4 g Topical QID [name], DO        PAST MEDICAL HISTORY: Past Medical History:  Diagnosis Date  . Allergic rhinitis   . Arthritis   . Cataract   . Depression   . Emphysema lung (HCC)    Early stage  . Endometrial polyp   . Hyperlipidemia   . Hyperlipidemia    on simvastatin  . Hypothyroidism   . IBS (irritable bowel syndrome)   . Obesity   . Osteoarthritis   . Seasonal allergies     PAST SURGICAL HISTORY: Past Surgical History:  Procedure Laterality Date  . BUNIONECTOMY  2009   Dr.Duda, left  . EYE SURGERY    . HYSTEROSCOPY W/D&C  04/24/2011   Procedure: DILATATION AND CURETTAGE (D&C) /HYSTEROSCOPY;  Surgeon: Eldred Manges, MD;  Location: [facility];  Service: Gynecology;  Laterality: N/A;  . 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 PLANTAR FASCIA SURGERY  1994   Dr Percell Miller  . POLYPECTOMY  04/24/2011   Procedure: POLYPECTOMY;  Surgeon: Lorriane Shire  Midge Minium, MD;  Location: [facility];  Service: Gynecology;  Laterality: N/A;    SOCIAL HISTORY: Social History   Tobacco Use  . Smoking status: Former Smoker    Packs/day: 2.00    Years: 42.00    Pack years: 84.00    Types: Cigarettes    Last attempt to quit: 07/28/2012    Years since quitting: 5.7  . Smokeless tobacco: Never Used  Substance Use Topics  . Alcohol use: Yes    Comment: rare  . Drug use: No    FAMILY HISTORY: Family History  Problem Relation Age of Onset  . Colon cancer Father 29  . Cancer Father 76       colon  . Alcoholism Father   . Pancreatic cancer Mother 44  . Cancer Mother 74       pancreatic  . Colon cancer Paternal Grandmother   . Cancer Paternal Grandmother 77        colon  . Heart attack Maternal Grandmother 50  . Heart disease Maternal Grandmother   . Cancer Sister 72       melanoma  . Breast cancer Sister   . Cancer Brother 72       skin  . Thyroid disease Sister   . Breast cancer Maternal Aunt 30  . Heart attack Maternal Aunt 60  . Breast cancer Sister     ROS: Review of Systems  Constitutional: Positive for malaise/fatigue and weight loss.  Gastrointestinal: Negative for nausea and vomiting.  Musculoskeletal:       Negative muscle weakness  Endo/Heme/Allergies:       Negative hypoglycemia    PHYSICAL EXAM: Blood pressure (!) 141/72, pulse 68, temperature 97.7 F (36.5 C), temperature source Oral, height 5' (1.524 m), weight 236 lb (107 kg), SpO2 97 %. Body mass index is 46.09 kg/m. Physical Exam  Constitutional: She is oriented to person, place, and time. She appears well-developed and well-nourished.  Cardiovascular: Normal rate.  Pulmonary/Chest: Effort normal.  Musculoskeletal: Normal range of motion.  Neurological: She is oriented to person, place, and time.  Skin: Skin is warm and dry.  Psychiatric: She has a normal mood and affect. Her behavior is normal.  Vitals reviewed.   RECENT LABS AND TESTS: BMET    Component Value Date/Time   NA 140 11/25/2017 1325   K 4.2 11/25/2017 1325   CL 103 11/25/2017 1325   CO2 30 11/25/2017 1325   GLUCOSE 97 11/25/2017 1325   GLUCOSE 93 07/16/2006 1501   BUN 12 11/25/2017 1325   CREATININE 0.67 11/25/2017 1325   CALCIUM 8.9 11/25/2017 1325   GFRNONAA 100.00 03/20/2010 1321   GFRAA 110 01/12/2008 1157   Lab Results  Component Value Date   HGBA1C 5.7 (H) 02/17/2018   HGBA1C 5.8 02/14/2017   HGBA1C 5.5 01/23/2015   HGBA1C 5.7 02/24/2014   HGBA1C 5.8 10/21/2013   Lab Results  Component Value Date   INSULIN 4.2 02/17/2018   CBC    Component Value Date/Time   WBC 3.7 (L) 11/25/2017 1325   RBC 4.58 11/25/2017 1325   HGB 13.9 11/25/2017 1325   HCT 41.6 11/25/2017 1325    PLT 164.0 11/25/2017 1325   MCV 90.7 11/25/2017 1325   MCH 31.1 04/17/2011 1358   MCHC 33.4 11/25/2017 1325   RDW 14.5 11/25/2017 1325   LYMPHSABS 0.9 11/25/2017 1325   MONOABS 0.4 11/25/2017 1325   EOSABS 0.1 11/25/2017 1325   BASOSABS 0.0 11/25/2017 1325   Iron/TIBC/Ferritin/ %Sat No  results found for: IRON, TIBC, FERRITIN, IRONPCTSAT Lipid Panel     Component Value Date/Time   CHOL 140 11/25/2017 1325   TRIG 83.0 11/25/2017 1325   HDL 59.90 11/25/2017 1325   CHOLHDL 2 11/25/2017 1325   VLDL 16.6 11/25/2017 1325   LDLCALC 63 11/25/2017 1325   LDLDIRECT 129.1 06/13/2011 1020   Hepatic Function Panel     Component Value Date/Time   PROT 6.1 11/25/2017 1325   ALBUMIN 3.9 11/25/2017 1325   AST 17 11/25/2017 1325   ALT 12 11/25/2017 1325   ALKPHOS 73 11/25/2017 1325   BILITOT 0.4 11/25/2017 1325   BILIDIR 0.1 12/23/2014 1031      Component Value Date/Time   TSH 6.930 (H) 02/17/2018 1113   TSH 3.48 11/25/2017 1325   TSH 3.09 11/08/2015 1312  Results for ARLOA, PRAK (MRN [id number]) as of 04/20/2018 09:23  Ref. Range 02/17/2018 11:13  Vitamin D, 25-Hydroxy Latest Ref Range: 30.0 - 100.0 ng/mL 24.2 (L)    ASSESSMENT AND PLAN: Prediabetes  Vitamin D deficiency  Class 3 severe obesity with serious comorbidity and body mass index (BMI) of 45.0 to 49.9 in adult, unspecified obesity type [name]  PLAN:  Pre-Diabetes [name] [name] will continue to work on weight loss, exercise, and decreasing simple carbohydrates in her diet to help decrease the risk of diabetes. We dicussed metformin including benefits and risks. She was informed that eating too many simple carbohydrates or too many calories at one sitting increases the likelihood of GI side effects. [name] [name] agrees to continue taking metformin, she denies needing a refill. [name] [name] agrees to follow up with our clinic in 2 weeks as directed to monitor her progress.  Vitamin D Deficiency [name] [name] was informed that low vitamin D levels  contributes to fatigue and are associated with obesity, breast, and colon cancer. [name] [name] agrees to continue taking prescription Vit D @50 ,000 IU every week, she is unsure if she needs a refill. She will follow up for routine testing of vitamin D, at least 2-3 times per year. She was informed of the risk of over-replacement of vitamin D and agrees to not increase her dose unless she discusses this with [place] first. [name] [name] agrees to follow up with our clinic in 2 weeks.  Obesity [name] [name] is currently in the action stage of change. As such, her goal is to continue with weight loss efforts She has agreed to follow the Category 2 plan [name] [name] has been instructed to work up to a goal of 150 minutes of combined cardio and strengthening exercise per week for weight loss and overall health benefits. We discussed the following Behavioral Modification Strategies today: increasing lean protein intake, increasing vegetables, work on meal planning and easy cooking plans, and planning for success   [name] [name] has agreed to follow up with our clinic in 2 weeks. She was informed of the importance of frequent follow up visits to maximize her success with intensive lifestyle modifications for her multiple health conditions.   OBESITY BEHAVIORAL INTERVENTION VISIT  Today's visit was # 5   Starting weight: 253 lbs Starting date: 02/17/18 Today's weight : 236 lbs  Today's date: 04/16/2018 Total lbs lost to date: 17    ASK: We discussed the diagnosis of obesity with [name] [name] today and [name] [name] agreed to give [place] permission to discuss obesity behavioral modification therapy today.  ASSESS: [name] [name] has the diagnosis of obesity and her BMI today is 46.09 [name] [name] is in the action stage of change   ADVISE: [name] [name] was  educated on the multiple health risks of obesity as well as the benefit of weight loss to improve her health. She was advised of the need for long term treatment and the importance of lifestyle modifications  to improve her current health and to decrease her risk of future health problems.  AGREE: Multiple dietary modification options and treatment options were discussed and  [name] [name] agreed to follow the recommendations documented in the above note.  ARRANGE: [name] [name] was educated on the importance of frequent visits to treat obesity as outlined per CMS and USPSTF guidelines and agreed to schedule her next follow up appointment today.  I, Trixie Dredge, am acting as transcriptionist for Ilene Qua, MD  I have reviewed the above documentation for accuracy and completeness, and I agree with the above. - Ilene Qua, MD

## 2018-04-22 ENCOUNTER — Other Ambulatory Visit: Payer: Self-pay | Admitting: Family Medicine

## 2018-04-22 DIAGNOSIS — E785 Hyperlipidemia, unspecified: Secondary | ICD-10-CM

## 2018-04-22 DIAGNOSIS — M170 Bilateral primary osteoarthritis of knee: Secondary | ICD-10-CM

## 2018-04-24 ENCOUNTER — Other Ambulatory Visit (INDEPENDENT_AMBULATORY_CARE_PROVIDER_SITE_OTHER): Payer: Self-pay | Admitting: Family Medicine

## 2018-04-24 ENCOUNTER — Encounter (INDEPENDENT_AMBULATORY_CARE_PROVIDER_SITE_OTHER): Payer: Self-pay | Admitting: Family Medicine

## 2018-04-24 DIAGNOSIS — R7303 Prediabetes: Secondary | ICD-10-CM

## 2018-04-24 DIAGNOSIS — E038 Other specified hypothyroidism: Secondary | ICD-10-CM

## 2018-04-24 DIAGNOSIS — E559 Vitamin D deficiency, unspecified: Secondary | ICD-10-CM

## 2018-04-27 MED ORDER — METFORMIN HCL 500 MG PO TABS: 500.0000 mg | ORAL_TABLET | Freq: Every day | ORAL | 0 refills | Status: DC

## 2018-04-27 MED ORDER — LEVOTHYROXINE SODIUM 112 MCG PO TABS: 112.0000 ug | ORAL_TABLET | Freq: Every day | ORAL | 0 refills | Status: DC

## 2018-04-30 ENCOUNTER — Encounter (INDEPENDENT_AMBULATORY_CARE_PROVIDER_SITE_OTHER): Payer: Self-pay | Admitting: Physician Assistant

## 2018-04-30 ENCOUNTER — Ambulatory Visit (INDEPENDENT_AMBULATORY_CARE_PROVIDER_SITE_OTHER): Payer: 59 | Admitting: Physician Assistant

## 2018-04-30 VITALS — BP 128/76 | HR 72 | Temp 97.9°F | Ht 60.0 in | Wt 233.0 lb

## 2018-04-30 DIAGNOSIS — Z9189 Other specified personal risk factors, not elsewhere classified: Secondary | ICD-10-CM

## 2018-04-30 DIAGNOSIS — E559 Vitamin D deficiency, unspecified: Secondary | ICD-10-CM | POA: Diagnosis not present

## 2018-04-30 DIAGNOSIS — R7303 Prediabetes: Secondary | ICD-10-CM

## 2018-04-30 DIAGNOSIS — Z6841 Body Mass Index (BMI) 40.0 and over, adult: Secondary | ICD-10-CM

## 2018-04-30 MED ORDER — VITAMIN D (ERGOCALCIFEROL) 1.25 MG (50000 UNIT) PO CAPS: 50000.0000 [IU] | ORAL_CAPSULE | ORAL | 0 refills | Status: DC

## 2018-05-04 NOTE — Progress Notes (Signed)
Office: [phone]  /  Fax: [phone]   HPI:   Chief Complaint: OBESITY [name] [name] is here to discuss her progress with her obesity treatment plan. She is on the Category 2 plan and is following her eating plan approximately 85 % of the time. She states she is using resistance bands for 15 minutes 3 times per week. [name] [name] did well with weight loss. She reports that she is missing avocados and would like to work them into her plan.  Her weight is 233 lb (105.7 kg) today and has had a weight loss of 3 pounds over a period of 2 weeks since her last visit. She has lost 20 lbs since starting treatment with [place].  Vitamin D Deficiency [name] [name] has a diagnosis of vitamin D deficiency. She is currently taking prescription Vit D and denies nausea, vomiting or muscle weakness.  At risk for osteopenia and osteoporosis [name] [name] is at higher risk of osteopenia and osteoporosis due to vitamin D deficiency.   Pre-Diabetes [name] [name] has a diagnosis of pre-diabetes based on her elevated Hgb A1c and was informed this puts her at greater risk of developing diabetes. She is on metformin and denies nausea, vomiting, or diarrhea. She continues to work on diet and exercise to decrease risk of diabetes. She denies polyphagia or hypoglycemia.  ALLERGIES: No Known Allergies  MEDICATIONS: Current Outpatient Medications on File Prior to Visit  Medication Sig Dispense Refill  . diclofenac sodium (VOLTAREN) 1 % GEL APPLY 4 GRAMS TOPICALLY TO  SKIN 4 TIMES DAILY. 400 g 1  . levothyroxine (SYNTHROID, LEVOTHROID) 112 MCG tablet Take 1 tablet (112 mcg total) by mouth daily. 30 tablet 0  . loperamide (IMODIUM) 2 MG capsule TAKE 1 CAPSULE BY MOUTH TWO TIMES DAILY 180 capsule 1  . metFORMIN (GLUCOPHAGE) 500 MG tablet Take 1 tablet (500 mg total) by mouth daily with breakfast. 30 tablet 0  . multivitamin (THERAGRAN) per tablet Take 1 tablet by mouth daily.      . sertraline (ZOLOFT) 50 MG tablet Take 1 tablet (50 mg total) by  mouth daily. 90 tablet 1  . simvastatin (ZOCOR) 20 MG tablet Take 1 tablet (20 mg total) by mouth at bedtime. 90 tablet 1  . Ubiquinol 100 MG CAPS Take by mouth.     Current Facility-Administered Medications on File Prior to Visit  Medication Dose Route Frequency Provider Last Rate Last Dose  . diclofenac sodium (VOLTAREN) 1 % transdermal gel 4 g  4 g Topical QID [name], DO        PAST MEDICAL HISTORY: Past Medical History:  Diagnosis Date  . Allergic rhinitis   . Arthritis   . Cataract   . Depression   . Emphysema lung (HCC)    Early stage  . Endometrial polyp   . Hyperlipidemia   . Hyperlipidemia    on simvastatin  . Hypothyroidism   . IBS (irritable bowel syndrome)   . Obesity   . Osteoarthritis   . Seasonal allergies     PAST SURGICAL HISTORY: Past Surgical History:  Procedure Laterality Date  . BUNIONECTOMY  2009   Dr.Duda, left  . EYE SURGERY    . HYSTEROSCOPY W/D&C  04/24/2011   Procedure: DILATATION AND CURETTAGE (D&C) /HYSTEROSCOPY;  Surgeon: Eldred Manges, MD;  Location: [facility];  Service: Gynecology;  Laterality: N/A;  . 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 PLANTAR FASCIA SURGERY  1994   Dr Percell Miller  . POLYPECTOMY  04/24/2011   Procedure: POLYPECTOMY;  Surgeon: Eldred Manges, MD;  Location: [facility];  Service: Gynecology;  Laterality: N/A;    SOCIAL HISTORY: Social History   Tobacco Use  . Smoking status: Former Smoker    Packs/day: 2.00    Years: 42.00    Pack years: 84.00    Types: Cigarettes    Last attempt to quit: 07/28/2012    Years since quitting: 5.7  . Smokeless tobacco: Never Used  Substance Use Topics  . Alcohol use: Yes    Comment: rare  . Drug use: No    FAMILY HISTORY: Family History  Problem Relation Age of Onset  . Colon cancer Father 1  . Cancer Father 83       colon  . Alcoholism Father   . Pancreatic cancer Mother 32  . Cancer Mother 53       pancreatic  . Colon cancer Paternal  Grandmother   . Cancer Paternal Grandmother 67       colon  . Heart attack Maternal Grandmother 50  . Heart disease Maternal Grandmother   . Cancer Sister 28       melanoma  . Breast cancer Sister   . Cancer Brother 59       skin  . Thyroid disease Sister   . Breast cancer Maternal Aunt 30  . Heart attack Maternal Aunt 60  . Breast cancer Sister     ROS: Review of Systems  Constitutional: Positive for weight loss.  Gastrointestinal: Negative for diarrhea, nausea and vomiting.  Musculoskeletal:       Negative muscle weakness  Endo/Heme/Allergies:       Negative polyphagia Negative hypoglycemia    PHYSICAL EXAM: Blood pressure 128/76, pulse 72, temperature 97.9 F (36.6 C), temperature source Oral, height 5' (1.524 m), weight 233 lb (105.7 kg), SpO2 97 %. Body mass index is 45.5 kg/m. Physical Exam  Constitutional: She is oriented to person, place, and time. She appears well-developed and well-nourished.  Cardiovascular: Normal rate.  Pulmonary/Chest: Effort normal.  Musculoskeletal: Normal range of motion.  Neurological: She is oriented to person, place, and time.  Skin: Skin is warm and dry.  Psychiatric: She has a normal mood and affect. Her behavior is normal.  Vitals reviewed.   RECENT LABS AND TESTS: BMET    Component Value Date/Time   NA 140 11/25/2017 1325   K 4.2 11/25/2017 1325   CL 103 11/25/2017 1325   CO2 30 11/25/2017 1325   GLUCOSE 97 11/25/2017 1325   GLUCOSE 93 07/16/2006 1501   BUN 12 11/25/2017 1325   CREATININE 0.67 11/25/2017 1325   CALCIUM 8.9 11/25/2017 1325   GFRNONAA 100.00 03/20/2010 1321   GFRAA 110 01/12/2008 1157   Lab Results  Component Value Date   HGBA1C 5.7 (H) 02/17/2018   HGBA1C 5.8 02/14/2017   HGBA1C 5.5 01/23/2015   HGBA1C 5.7 02/24/2014   HGBA1C 5.8 10/21/2013   Lab Results  Component Value Date   INSULIN 4.2 02/17/2018   CBC    Component Value Date/Time   WBC 3.7 (L) 11/25/2017 1325   RBC 4.58 11/25/2017  1325   HGB 13.9 11/25/2017 1325   HCT 41.6 11/25/2017 1325   PLT 164.0 11/25/2017 1325   MCV 90.7 11/25/2017 1325   MCH 31.1 04/17/2011 1358   MCHC 33.4 11/25/2017 1325   RDW 14.5 11/25/2017 1325   LYMPHSABS 0.9 11/25/2017 1325   MONOABS 0.4 11/25/2017 1325   EOSABS 0.1 11/25/2017 1325   BASOSABS  0.0 11/25/2017 1325   Iron/TIBC/Ferritin/ %Sat No results found for: IRON, TIBC, FERRITIN, IRONPCTSAT Lipid Panel     Component Value Date/Time   CHOL 140 11/25/2017 1325   TRIG 83.0 11/25/2017 1325   HDL 59.90 11/25/2017 1325   CHOLHDL 2 11/25/2017 1325   VLDL 16.6 11/25/2017 1325   LDLCALC 63 11/25/2017 1325   LDLDIRECT 129.1 06/13/2011 1020   Hepatic Function Panel     Component Value Date/Time   PROT 6.1 11/25/2017 1325   ALBUMIN 3.9 11/25/2017 1325   AST 17 11/25/2017 1325   ALT 12 11/25/2017 1325   ALKPHOS 73 11/25/2017 1325   BILITOT 0.4 11/25/2017 1325   BILIDIR 0.1 12/23/2014 1031      Component Value Date/Time   TSH 6.930 (H) 02/17/2018 1113   TSH 3.48 11/25/2017 1325   TSH 3.09 11/08/2015 1312  Results for EMMARY, CULBREATH (MRN [id number]) as of 05/04/2018 14:15  Ref. Range 02/17/2018 11:13  Vitamin D, 25-Hydroxy Latest Ref Range: 30.0 - 100.0 ng/mL 24.2 (L)    ASSESSMENT AND PLAN: Vitamin D deficiency - Plan: Vitamin D, Ergocalciferol, (DRISDOL) 50000 units CAPS capsule  Prediabetes  At risk for osteoporosis  Class 3 severe obesity with serious comorbidity and body mass index (BMI) of 45.0 to 49.9 in adult, unspecified obesity type ([place])  PLAN:  Vitamin D Deficiency [name] [name] was informed that low vitamin D levels contributes to fatigue and are associated with obesity, breast, and colon cancer. She agrees to continue to take prescription Vit D @50 ,000 IU every week and will follow up for routine testing of vitamin D, at least 2-3 times per year. She was informed of the risk of over-replacement of vitamin D and agrees to not increase her dose unless she  discusses this with [place] first.  At risk for osteopenia and osteoporosis [name] [name] was given extended (15 minutes) osteoporosis prevention counseling today. [name] [name] is at risk for osteopenia and osteoporsis due to her vitamin D deficiency. She was encouraged to take her vitamin D and follow her higher calcium diet and increase strengthening exercise to help strengthen her bones and decrease her risk of osteopenia and osteoporosis.  Pre-Diabetes [name] [name] will continue to work on weight loss, diet, exercise, and decreasing simple carbohydrates in her diet to help decrease the risk of diabetes. We dicussed metformin including benefits and risks. She was informed that eating too many simple carbohydrates or too many calories at one sitting increases the likelihood of GI side effects. [name] [name] agrees to continue taking metformin and she agrees to follow up with our clinic in 2 weeks as directed to monitor her progress.  Obesity [name] [name] is currently in the action stage of change. As such, her goal is to continue with weight loss efforts She has agreed to follow the Category 2 plan [name] [name] has been instructed to work up to a goal of 150 minutes of combined cardio and strengthening exercise per week for weight loss and overall health benefits. We discussed the following Behavioral Modification Strategies today: work on meal planning and easy cooking plans and ways to avoid boredom eating   [name] [name] has agreed to follow up with our clinic in 2 weeks. She was informed of the importance of frequent follow up visits to maximize her success with intensive lifestyle modifications for her multiple health conditions.   OBESITY BEHAVIORAL INTERVENTION VISIT  Today's visit was # 20  Starting weight: 253 lbs Starting date: 02/17/18 Today's weight : 233 lbs  Today's date: 04/30/2018 Total lbs lost  to date: 42    ASK: We discussed the diagnosis of obesity with [name] [name] today and [name] [name] agreed to give [place] permission  to discuss obesity behavioral modification therapy today.  ASSESS: [name] [name] has the diagnosis of obesity and her BMI today is 37.5 [name] [name] is in the action stage of change   ADVISE: [name] [name] was educated on the multiple health risks of obesity as well as the benefit of weight loss to improve her health. She was advised of the need for long term treatment and the importance of lifestyle modifications.  AGREE: Multiple dietary modification options and treatment options were discussed and  [name] [name] agreed to the above obesity treatment plan.  [name] [name] Durie, am acting as transcriptionist for Abby Potash, PA-C I, Abby Potash, PA-C have reviewed above note and agree with its content

## 2018-05-12 ENCOUNTER — Encounter (INDEPENDENT_AMBULATORY_CARE_PROVIDER_SITE_OTHER): Payer: Self-pay | Admitting: Physician Assistant

## 2018-05-12 ENCOUNTER — Ambulatory Visit (INDEPENDENT_AMBULATORY_CARE_PROVIDER_SITE_OTHER): Payer: 59 | Admitting: Physician Assistant

## 2018-05-12 VITALS — BP 114/73 | HR 64 | Temp 98.6°F | Ht 60.0 in | Wt 228.0 lb

## 2018-05-12 DIAGNOSIS — R7303 Prediabetes: Secondary | ICD-10-CM | POA: Diagnosis not present

## 2018-05-12 DIAGNOSIS — Z9189 Other specified personal risk factors, not elsewhere classified: Secondary | ICD-10-CM | POA: Diagnosis not present

## 2018-05-12 DIAGNOSIS — E038 Other specified hypothyroidism: Secondary | ICD-10-CM

## 2018-05-12 DIAGNOSIS — Z6841 Body Mass Index (BMI) 40.0 and over, adult: Secondary | ICD-10-CM

## 2018-05-12 MED ORDER — METFORMIN HCL 500 MG PO TABS: 500.0000 mg | ORAL_TABLET | Freq: Every day | ORAL | 0 refills | Status: DC

## 2018-05-12 MED ORDER — LEVOTHYROXINE SODIUM 112 MCG PO TABS: 112.0000 ug | ORAL_TABLET | Freq: Every day | ORAL | 0 refills | Status: DC

## 2018-05-13 NOTE — Progress Notes (Signed)
Office: [phone]  /  Fax: [phone]   HPI:   Chief Complaint: OBESITY [name] [name] is here to discuss her progress with her obesity treatment plan. She is on the  follow the Category 2 plan and is following her eating plan approximately 85 % of the time. She states she is exercising 0 minutes 0 times per week. [name] [name] did very well with weight loss. She reports that she eats out at times fro dinner but eats a protein and vegetable.   Her weight is 228 lb (103.4 kg) today and has had a weight loss of 5 pounds over a period of 2 weeks since her last visit. She has lost 25 lbs since starting treatment with [place].  Hypothyroid [name] [name] has a diagnosis of hypothyroidism. She is on levothyroxine. She denies hot or cold intolerance or palpitations, but does admit to ongoing fatigue.  Pre-Diabetes [name] [name] has a diagnosis of prediabetes based on her elevated HgA1c and was informed this puts her at greater risk of developing diabetes. She is taking metformin currently and continues to work on diet and exercise to decrease risk of diabetes. She denies nausea or hypoglycemia.  At risk for diabetes [name] [name] is at higher than averagerisk for developing diabetes due to her obesity. She currently denies polyuria or polydipsia.  ALLERGIES: No Known Allergies  MEDICATIONS: Current Outpatient Medications on File Prior to Visit  Medication Sig Dispense Refill  . diclofenac sodium (VOLTAREN) 1 % GEL APPLY 4 GRAMS TOPICALLY TO  SKIN 4 TIMES DAILY. 400 g 1  . loperamide (IMODIUM) 2 MG capsule TAKE 1 CAPSULE BY MOUTH TWO TIMES DAILY 180 capsule 1  . multivitamin (THERAGRAN) per tablet Take 1 tablet by mouth daily.      . sertraline (ZOLOFT) 50 MG tablet Take 1 tablet (50 mg total) by mouth daily. 90 tablet 1  . simvastatin (ZOCOR) 20 MG tablet Take 1 tablet (20 mg total) by mouth at bedtime. 90 tablet 1  . Ubiquinol 100 MG CAPS Take by mouth.    . Vitamin D, Ergocalciferol, (DRISDOL) 50000 units CAPS capsule  Take 1 capsule (50,000 Units total) by mouth every 7 (seven) days. 4 capsule 0   Current Facility-Administered Medications on File Prior to Visit  Medication Dose Route Frequency Provider Last Rate Last Dose  . diclofenac sodium (VOLTAREN) 1 % transdermal gel 4 g  4 g Topical QID [name], DO        PAST MEDICAL HISTORY: Past Medical History:  Diagnosis Date  . Allergic rhinitis   . Arthritis   . Cataract   . Depression   . Emphysema lung (HCC)    Early stage  . Endometrial polyp   . Hyperlipidemia   . Hyperlipidemia    on simvastatin  . Hypothyroidism   . IBS (irritable bowel syndrome)   . Obesity   . Osteoarthritis   . Seasonal allergies     PAST SURGICAL HISTORY: Past Surgical History:  Procedure Laterality Date  . BUNIONECTOMY  2009   Dr.Duda, left  . EYE SURGERY    . HYSTEROSCOPY W/D&C  04/24/2011   Procedure: DILATATION AND CURETTAGE (D&C) /HYSTEROSCOPY;  Surgeon: Eldred Manges, MD;  Location: [facility];  Service: Gynecology;  Laterality: N/A;  . 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 PLANTAR FASCIA SURGERY  1994   Dr Percell Miller  . POLYPECTOMY  04/24/2011   Procedure: POLYPECTOMY;  Surgeon: Eldred Manges, MD;  Location: [facility];  Service: Gynecology;  Laterality: N/A;    SOCIAL HISTORY: Social History   Tobacco Use  . Smoking status: Former Smoker    Packs/day: 2.00    Years: 42.00    Pack years: 84.00    Types: Cigarettes    Last attempt to quit: 07/28/2012    Years since quitting: 5.7  . Smokeless tobacco: Never Used  Substance Use Topics  . Alcohol use: Yes    Comment: rare  . Drug use: No    FAMILY HISTORY: Family History  Problem Relation Age of Onset  . Colon cancer Father 59  . Cancer Father 51       colon  . Alcoholism Father   . Pancreatic cancer Mother 8  . Cancer Mother 52       pancreatic  . Colon cancer Paternal Grandmother   . Cancer Paternal Grandmother 43       colon  . Heart attack Maternal  Grandmother 50  . Heart disease Maternal Grandmother   . Cancer Sister 40       melanoma  . Breast cancer Sister   . Cancer Brother 31       skin  . Thyroid disease Sister   . Breast cancer Maternal Aunt 30  . Heart attack Maternal Aunt 60  . Breast cancer Sister     ROS: Review of Systems  Constitutional: Positive for weight loss.  All other systems reviewed and are negative.   PHYSICAL EXAM: Blood pressure 114/73, pulse 64, temperature 98.6 F (37 C), temperature source Oral, height 5' (1.524 m), weight 228 lb (103.4 kg), SpO2 97 %. Body mass index is 44.53 kg/m. Physical Exam  Constitutional: She is oriented to person, place, and time. She appears well-developed and well-nourished.  HENT:  Head: Normocephalic and atraumatic.  Eyes: Pupils are equal, round, and reactive to light.  Neck: Normal range of motion.  Pulmonary/Chest: Effort normal.  Neurological: She is alert and oriented to person, place, and time.  Skin: Skin is warm and dry.  Psychiatric: She has a normal mood and affect. Her behavior is normal.  Vitals reviewed.   RECENT LABS AND TESTS: BMET    Component Value Date/Time   NA 140 11/25/2017 1325   K 4.2 11/25/2017 1325   CL 103 11/25/2017 1325   CO2 30 11/25/2017 1325   GLUCOSE 97 11/25/2017 1325   GLUCOSE 93 07/16/2006 1501   BUN 12 11/25/2017 1325   CREATININE 0.67 11/25/2017 1325   CALCIUM 8.9 11/25/2017 1325   GFRNONAA 100.00 03/20/2010 1321   GFRAA 110 01/12/2008 1157   Lab Results  Component Value Date   HGBA1C 5.7 (H) 02/17/2018   HGBA1C 5.8 02/14/2017   HGBA1C 5.5 01/23/2015   HGBA1C 5.7 02/24/2014   HGBA1C 5.8 10/21/2013   Lab Results  Component Value Date   INSULIN 4.2 02/17/2018   CBC    Component Value Date/Time   WBC 3.7 (L) 11/25/2017 1325   RBC 4.58 11/25/2017 1325   HGB 13.9 11/25/2017 1325   HCT 41.6 11/25/2017 1325   PLT 164.0 11/25/2017 1325   MCV 90.7 11/25/2017 1325   MCH 31.1 04/17/2011 1358   MCHC 33.4  11/25/2017 1325   RDW 14.5 11/25/2017 1325   LYMPHSABS 0.9 11/25/2017 1325   MONOABS 0.4 11/25/2017 1325   EOSABS 0.1 11/25/2017 1325   BASOSABS 0.0 11/25/2017 1325   Iron/TIBC/Ferritin/ %Sat No results found for: IRON, TIBC, FERRITIN, IRONPCTSAT Lipid Panel     Component Value Date/Time   CHOL 140  11/25/2017 1325   TRIG 83.0 11/25/2017 1325   HDL 59.90 11/25/2017 1325   CHOLHDL 2 11/25/2017 1325   VLDL 16.6 11/25/2017 1325   LDLCALC 63 11/25/2017 1325   LDLDIRECT 129.1 06/13/2011 1020   Hepatic Function Panel     Component Value Date/Time   PROT 6.1 11/25/2017 1325   ALBUMIN 3.9 11/25/2017 1325   AST 17 11/25/2017 1325   ALT 12 11/25/2017 1325   ALKPHOS 73 11/25/2017 1325   BILITOT 0.4 11/25/2017 1325   BILIDIR 0.1 12/23/2014 1031      Component Value Date/Time   TSH 6.930 (H) 02/17/2018 1113   TSH 3.48 11/25/2017 1325   TSH 3.09 11/08/2015 1312    ASSESSMENT AND PLAN: Other specified hypothyroidism - Plan: levothyroxine (SYNTHROID, LEVOTHROID) 112 MCG tablet  Prediabetes - Plan: metFORMIN (GLUCOPHAGE) 500 MG tablet  At risk for diabetes mellitus  Class 3 severe obesity with serious comorbidity and body mass index (BMI) of 40.0 to 44.9 in adult, unspecified obesity type ([place])  PLAN: Hypothyroid Gloris was informed of the importance of good thyroid control to help with weight loss efforts. She was also informed that supertheraputic thyroid levels are dangerous and will not improve weight loss results.   Pre-Diabetes Marishka will continue to work on weight loss, exercise, and decreasing simple carbohydrates in her diet to help decrease the risk of diabetes. We dicussed metformin including benefits and risks. She was informed that eating too many simple carbohydrates or too many calories at one sitting increases the likelihood of GI side effects. Laytoya requested metformin for now and a prescription was written today. Deshundra agreed to follow up with [place] as directed to  monitor her progress.  Diabetes risk counselling Elanda was given extended (15 minutes) diabetes prevention counseling today. She is 72 y.o. female and has risk factors for diabetes including obesity. We discussed intensive lifestyle modifications today with an emphasis on weight loss as well as increasing exercise and decreasing simple carbohydrates in her diet.  Obesity Rickia is currently in the action stage of change. As such, her goal is to continue with weight loss efforts. She has agreed to follow the Category 2 plan daily.  Raniyah has been instructed to work up to a goal of 150 minutes of combined cardio and strengthening exercise per week for weight loss and overall health benefits. We discussed the following Behavioral Modification Stratagies today: work on meal planning and easy cooking plans and keeping healthy foods in the home.    Colleen has agreed to follow up with our clinic in 2 weeks. She was informed of the importance of frequent follow up visits to maximize her success with intensive lifestyle modifications for her multiple health conditions.   OBESITY BEHAVIORAL INTERVENTION VISIT  Today's visit was # 21  Starting weight: 253 lbs Starting date: 02/17/18 Today's weight : Weight: 228 lb (103.4 kg)  Today's date: 05/12/18 Total lbs lost to date: 25    ASK: We discussed the diagnosis of obesity with Sherol Dade today and Rosann Auerbach agreed to give [place] permission to discuss obesity behavioral modification therapy today.  ASSESS: Jamyia has the diagnosis of obesity and her BMI today is @TBMI @ Jasa is in the action stage of change   ADVISE: Charnelle was educated on the multiple health risks of obesity as well as the benefit of weight loss to improve her health. She was advised of the need for long term treatment and the importance of lifestyle modifications to improve her current health  and to decrease her risk of future health problems.  AGREE: Multiple dietary  modification options and treatment options were discussed and  Marguerita agreed to follow the recommendations documented in the above note.  ARRANGE: Tristian was educated on the importance of frequent visits to treat obesity as outlined per CMS and USPSTF guidelines and agreed to schedule her next follow up appointment today.  I, April Moore, am acting as Location manager for [employer] PA-C I, Abby Potash, PA-C have reviewed above note and agree with its content

## 2018-05-26 ENCOUNTER — Encounter (INDEPENDENT_AMBULATORY_CARE_PROVIDER_SITE_OTHER): Payer: Self-pay | Admitting: Physician Assistant

## 2018-05-26 ENCOUNTER — Ambulatory Visit (INDEPENDENT_AMBULATORY_CARE_PROVIDER_SITE_OTHER): Payer: 59 | Admitting: Physician Assistant

## 2018-05-26 VITALS — BP 111/65 | HR 65 | Temp 98.4°F | Ht 60.0 in | Wt 222.0 lb

## 2018-05-26 DIAGNOSIS — E7849 Other hyperlipidemia: Secondary | ICD-10-CM | POA: Diagnosis not present

## 2018-05-26 DIAGNOSIS — Z9189 Other specified personal risk factors, not elsewhere classified: Secondary | ICD-10-CM | POA: Diagnosis not present

## 2018-05-26 DIAGNOSIS — Z6841 Body Mass Index (BMI) 40.0 and over, adult: Secondary | ICD-10-CM

## 2018-05-26 DIAGNOSIS — E038 Other specified hypothyroidism: Secondary | ICD-10-CM

## 2018-05-26 DIAGNOSIS — E559 Vitamin D deficiency, unspecified: Secondary | ICD-10-CM

## 2018-05-26 DIAGNOSIS — R7303 Prediabetes: Secondary | ICD-10-CM | POA: Diagnosis not present

## 2018-05-26 MED ORDER — VITAMIN D (ERGOCALCIFEROL) 1.25 MG (50000 UNIT) PO CAPS: 50000.0000 [IU] | ORAL_CAPSULE | ORAL | 0 refills | Status: DC

## 2018-05-26 MED ORDER — LEVOTHYROXINE SODIUM 112 MCG PO TABS: 112.0000 ug | ORAL_TABLET | Freq: Every day | ORAL | 0 refills | Status: DC

## 2018-05-26 MED ORDER — METFORMIN HCL 500 MG PO TABS: 500.0000 mg | ORAL_TABLET | Freq: Every day | ORAL | 0 refills | Status: DC

## 2018-05-26 NOTE — Progress Notes (Signed)
Office: [phone]  /  Fax: [phone]   HPI:   Chief Complaint: OBESITY [name] [name] is here to discuss her progress with her obesity treatment plan. She is on the Category 2 plan and is following her eating plan approximately 85 % of the time. She states she is exercising 0 minutes 0 times per week. Dillon did well with weight loss. She reports following the plan closely. She wants to talk about celebrations eating strategies.  Her weight is 222 lb (100.7 kg) today and has had a weight loss of 6 pounds over a period of 2 weeks since her last visit. She has lost 31 lbs since starting treatment with [place].  Hypothyroidism [name] [name] has a diagnosis of hypothyroidism. She is on levothyroxine. She denies hot or cold intolerance, palpitations, or excessive fatigue.  Pre-Diabetes [name] [name] has a diagnosis of pre-diabetes based on her elevated Hgb A1c and was informed this puts her at greater risk of developing diabetes. She denies nausea, vomiting, or diarrhea on metformin and continues to work on diet and exercise to decrease risk of diabetes. She denies polyphagia or hypoglycemia.  At risk for diabetes [name] [name] is at higher than average risk for developing diabetes due to her obesity and pre-diabetes. She currently denies polyuria or polydipsia.  Vitamin D Deficiency [name] [name] has a diagnosis of vitamin D deficiency. She is currently taking prescription Vit D and denies nausea, vomiting or muscle weakness.  Hyperlipidemia [name] [name] has hyperlipidemia and has been trying to improve her cholesterol levels with intensive lifestyle modification including a low saturated fat diet, exercise and weight loss. She is on Zocor and denies any chest pain, claudication or myalgias.  ALLERGIES: No Known Allergies  MEDICATIONS: Current Outpatient Medications on File Prior to Visit  Medication Sig Dispense Refill  . diclofenac sodium (VOLTAREN) 1 % GEL APPLY 4 GRAMS TOPICALLY TO  SKIN 4 TIMES DAILY. 400 g 1  .  loperamide (IMODIUM) 2 MG capsule TAKE 1 CAPSULE BY MOUTH TWO TIMES DAILY 180 capsule 1  . multivitamin (THERAGRAN) per tablet Take 1 tablet by mouth daily.      . sertraline (ZOLOFT) 50 MG tablet Take 1 tablet (50 mg total) by mouth daily. 90 tablet 1  . simvastatin (ZOCOR) 20 MG tablet Take 1 tablet (20 mg total) by mouth at bedtime. 90 tablet 1  . Ubiquinol 100 MG CAPS Take by mouth.     Current Facility-Administered Medications on File Prior to Visit  Medication Dose Route Frequency Provider Last Rate Last Dose  . diclofenac sodium (VOLTAREN) 1 % transdermal gel 4 g  4 g Topical QID [name], DO        PAST MEDICAL HISTORY: Past Medical History:  Diagnosis Date  . Allergic rhinitis   . Arthritis   . Cataract   . Depression   . Emphysema lung (HCC)    Early stage  . Endometrial polyp   . Hyperlipidemia   . Hyperlipidemia    on simvastatin  . Hypothyroidism   . IBS (irritable bowel syndrome)   . Obesity   . Osteoarthritis   . Seasonal allergies     PAST SURGICAL HISTORY: Past Surgical History:  Procedure Laterality Date  . BUNIONECTOMY  2009   Dr.Duda, left  . EYE SURGERY    . HYSTEROSCOPY W/D&C  04/24/2011   Procedure: DILATATION AND CURETTAGE (D&C) /HYSTEROSCOPY;  Surgeon: Eldred Manges, MD;  Location: [facility];  Service: Gynecology;  Laterality: N/A;  . 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 PLANTAR FASCIA SURGERY  1994   Dr Percell Miller  . POLYPECTOMY  04/24/2011   Procedure: POLYPECTOMY;  Surgeon: Eldred Manges, MD;  Location: [facility];  Service: Gynecology;  Laterality: N/A;    SOCIAL HISTORY: Social History   Tobacco Use  . Smoking status: Former Smoker    Packs/day: 2.00    Years: 42.00    Pack years: 84.00    Types: Cigarettes    Last attempt to quit: 07/28/2012    Years since quitting: 5.8  . Smokeless tobacco: Never Used  Substance Use Topics  . Alcohol use: Yes    Comment: rare  . Drug use: No    FAMILY  HISTORY: Family History  Problem Relation Age of Onset  . Colon cancer Father 62  . Cancer Father 72       colon  . Alcoholism Father   . Pancreatic cancer Mother 54  . Cancer Mother 48       pancreatic  . Colon cancer Paternal Grandmother   . Cancer Paternal Grandmother 50       colon  . Heart attack Maternal Grandmother 50  . Heart disease Maternal Grandmother   . Cancer Sister 81       melanoma  . Breast cancer Sister   . Cancer Brother 70       skin  . Thyroid disease Sister   . Breast cancer Maternal Aunt 30  . Heart attack Maternal Aunt 60  . Breast cancer Sister     ROS: Review of Systems  Constitutional: Positive for weight loss. Negative for malaise/fatigue.  Cardiovascular: Negative for chest pain, palpitations and claudication.  Gastrointestinal: Negative for diarrhea, nausea and vomiting.  Genitourinary: Negative for frequency.  Musculoskeletal: Negative for myalgias.       Negative muscle weakness  Endo/Heme/Allergies: Negative for polydipsia.       Negative hot/cold intolerance Negative polyphagia Negative hypoglycemia    PHYSICAL EXAM: Blood pressure 111/65, pulse 65, temperature 98.4 F (36.9 C), temperature source Oral, height 5' (1.524 m), weight 222 lb (100.7 kg), SpO2 96 %. Body mass index is 43.36 kg/m. Physical Exam  Constitutional: She is oriented to person, place, and time. She appears well-developed and well-nourished.  Cardiovascular: Normal rate.  Pulmonary/Chest: Effort normal.  Musculoskeletal: Normal range of motion.  Neurological: She is oriented to person, place, and time.  Skin: Skin is warm and dry.  Psychiatric: She has a normal mood and affect. Her behavior is normal.  Vitals reviewed.   RECENT LABS AND TESTS: BMET    Component Value Date/Time   NA 140 11/25/2017 1325   K 4.2 11/25/2017 1325   CL 103 11/25/2017 1325   CO2 30 11/25/2017 1325   GLUCOSE 97 11/25/2017 1325   GLUCOSE 93 07/16/2006 1501   BUN 12  11/25/2017 1325   CREATININE 0.67 11/25/2017 1325   CALCIUM 8.9 11/25/2017 1325   GFRNONAA 100.00 03/20/2010 1321   GFRAA 110 01/12/2008 1157   Lab Results  Component Value Date   HGBA1C 5.7 (H) 02/17/2018   HGBA1C 5.8 02/14/2017   HGBA1C 5.5 01/23/2015   HGBA1C 5.7 02/24/2014   HGBA1C 5.8 10/21/2013   Lab Results  Component Value Date   INSULIN 4.2 02/17/2018   CBC    Component Value Date/Time   WBC 3.7 (L) 11/25/2017 1325   RBC 4.58 11/25/2017 1325   HGB 13.9 11/25/2017 1325   HCT 41.6 11/25/2017 1325   PLT 164.0 11/25/2017 1325  MCV 90.7 11/25/2017 1325   MCH 31.1 04/17/2011 1358   MCHC 33.4 11/25/2017 1325   RDW 14.5 11/25/2017 1325   LYMPHSABS 0.9 11/25/2017 1325   MONOABS 0.4 11/25/2017 1325   EOSABS 0.1 11/25/2017 1325   BASOSABS 0.0 11/25/2017 1325   Iron/TIBC/Ferritin/ %Sat No results found for: IRON, TIBC, FERRITIN, IRONPCTSAT Lipid Panel     Component Value Date/Time   CHOL 140 11/25/2017 1325   TRIG 83.0 11/25/2017 1325   HDL 59.90 11/25/2017 1325   CHOLHDL 2 11/25/2017 1325   VLDL 16.6 11/25/2017 1325   LDLCALC 63 11/25/2017 1325   LDLDIRECT 129.1 06/13/2011 1020   Hepatic Function Panel     Component Value Date/Time   PROT 6.1 11/25/2017 1325   ALBUMIN 3.9 11/25/2017 1325   AST 17 11/25/2017 1325   ALT 12 11/25/2017 1325   ALKPHOS 73 11/25/2017 1325   BILITOT 0.4 11/25/2017 1325   BILIDIR 0.1 12/23/2014 1031      Component Value Date/Time   TSH 6.930 (H) 02/17/2018 1113   TSH 3.48 11/25/2017 1325   TSH 3.09 11/08/2015 1312  Results for ELIZETH, WEINRICH (MRN [id number]) as of 05/26/2018 15:45  Ref. Range 02/17/2018 11:13  Vitamin D, 25-Hydroxy Latest Ref Range: 30.0 - 100.0 ng/mL 24.2 (L)    ASSESSMENT AND PLAN: Other specified hypothyroidism - Plan: T3, T4, free, TSH, levothyroxine (SYNTHROID, LEVOTHROID) 112 MCG tablet  Prediabetes - Plan: Comprehensive metabolic panel, CBC With Differential, Hemoglobin A1c, Insulin, random,  metFORMIN (GLUCOPHAGE) 500 MG tablet  Vitamin D deficiency - Plan: VITAMIN D 25 Hydroxy (Vit-D Deficiency, Fractures), Vitamin D, Ergocalciferol, (DRISDOL) 1.25 MG (50000 UT) CAPS capsule  Other hyperlipidemia - Plan: Lipid Panel With LDL/HDL Ratio  At risk for diabetes mellitus  Class 3 severe obesity with serious comorbidity and body mass index (BMI) of 40.0 to 44.9 in adult, unspecified obesity type (HCC)  PLAN:  Hypothyroidism [name] [name] was informed of the importance of good thyroid control to help with weight loss efforts. She was also informed that supertheraputic thyroid levels are dangerous and will not improve weight loss results. [name] [name] agrees to continue taking levothyroxine 112 mcg qd #30 and we will refill for 1 month. We will check labs and [name] [name] agrees to follow up with our clinic in 2 weeks.  Pre-Diabetes [name] [name] will continue to work on weight loss, exercise, and decreasing simple carbohydrates in her diet to help decrease the risk of diabetes. We dicussed metformin including benefits and risks. She was informed that eating too many simple carbohydrates or too many calories at one sitting increases the likelihood of GI side effects. [name] [name] agrees to continue taking 500 mg q AM #30 and we will refill for 1 month. We will check labs and [name] [name] agrees to follow up with our clinic in 2 weeks as directed to monitor her progress.  Diabetes risk counselling [name] [name] was given extended (15 minutes) diabetes prevention counseling today. She is 72 y.o. female and has risk factors for diabetes including obesity and pre-diabetes. We discussed intensive lifestyle modifications today with an emphasis on weight loss as well as increasing exercise and decreasing simple carbohydrates in her diet.  Vitamin D Deficiency [name] [name] was informed that low vitamin D levels contributes to fatigue and are associated with obesity, breast, and colon cancer. [name] [name] agrees to continue taking prescription Vit D  @50 ,000 IU every week #4 and we will refill for 1 month. She will follow up for routine testing of vitamin D, at least 2-3 times per  year. She was informed of the risk of over-replacement of vitamin D and agrees to not increase her dose unless she discusses this with [place] first. We will check labs and [name] [name] agrees to follow up with our clinic in 2 weeks.   Hyperlipidemia [name] [name] was informed of the American Heart Association Guidelines emphasizing intensive lifestyle modifications as the first line treatment for hyperlipidemia. We discussed many lifestyle modifications today in depth, and [name] [name] will continue to work on decreasing saturated fats such as fatty red meat, butter and many fried foods. She will also increase vegetables and lean protein in her diet and continue to work on diet, exercise, and weight loss efforts. We will check labs and [name] [name] agrees to follow up with our clinic in 2 weeks.  Obesity [name] [name] is currently in the action stage of change. As such, her goal is to continue with weight loss efforts She has agreed to follow the Category 2 plan [name] [name] has been instructed to work up to a goal of 150 minutes of combined cardio and strengthening exercise per week for weight loss and overall health benefits. We discussed the following Behavioral Modification Strategies today: work on meal planning and easy cooking plans and celebration eating strategies    [name] [name] has agreed to follow up with our clinic in 2 weeks. She was informed of the importance of frequent follow up visits to maximize her success with intensive lifestyle modifications for her multiple health conditions.   OBESITY BEHAVIORAL INTERVENTION VISIT  Today's visit was # 8 .  Starting weight: 253 lbs Starting date: 02/17/18 Today's weight : 222 lbs Today's date: 05/26/2018 Total lbs lost to date: 43    ASK: We discussed the diagnosis of obesity with [name] [name] today and Ziah agreed to give [place] permission to  discuss obesity behavioral modification therapy today.  ASSESS: Beronica has the diagnosis of obesity and her BMI today is 43.36 Leiyah is in the action stage of change   ADVISE: Acire was educated on the multiple health risks of obesity as well as the benefit of weight loss to improve her health. She was advised of the need for long term treatment and the importance of lifestyle modifications.  AGREE: Multiple dietary modification options and treatment options were discussed and  Jamella agreed to the above obesity treatment plan.  Wilhemena Durie, am acting as transcriptionist for Abby Potash, PA-C I, Abby Potash, PA-C have reviewed above note and agree with its content

## 2018-05-27 LAB — CBC WITH DIFFERENTIAL
BASOS ABS: 0 10*3/uL (ref 0.0–0.2)
Basos: 1 %
EOS (ABSOLUTE): 0.1 10*3/uL (ref 0.0–0.4)
Eos: 1 %
Hematocrit: 45.3 % (ref 34.0–46.6)
Hemoglobin: 14.9 g/dL (ref 11.1–15.9)
IMMATURE GRANS (ABS): 0 10*3/uL (ref 0.0–0.1)
Immature Granulocytes: 0 %
LYMPHS: 22 %
Lymphocytes Absolute: 1.3 10*3/uL (ref 0.7–3.1)
MCH: 30.1 pg (ref 26.6–33.0)
MCHC: 32.9 g/dL (ref 31.5–35.7)
MCV: 92 fL (ref 79–97)
Monocytes Absolute: 0.5 10*3/uL (ref 0.1–0.9)
Monocytes: 8 %
NEUTROS PCT: 68 %
Neutrophils Absolute: 4.1 10*3/uL (ref 1.4–7.0)
RBC: 4.95 x10E6/uL (ref 3.77–5.28)
RDW: 13.9 % (ref 12.3–15.4)
WBC: 6 10*3/uL (ref 3.4–10.8)

## 2018-05-27 LAB — LIPID PANEL WITH LDL/HDL RATIO
Cholesterol, Total: 167 mg/dL (ref 100–199)
HDL: 74 mg/dL (ref >39)
LDL Calculated: 80 mg/dL (ref 0–99)
LDL/HDL RATIO: 1.1 ratio (ref 0.0–3.2)
Triglycerides: 67 mg/dL (ref 0–149)
VLDL Cholesterol Cal: 13 mg/dL (ref 5–40)

## 2018-05-27 LAB — COMPREHENSIVE METABOLIC PANEL
A/G RATIO: 2.1 (ref 1.2–2.2)
ALT: 16 IU/L (ref 0–32)
AST: 17 IU/L (ref 0–40)
Albumin: 4.5 g/dL (ref 3.6–4.8)
Alkaline Phosphatase: 102 IU/L (ref 39–117)
BUN/Creatinine Ratio: 35 — ABNORMAL HIGH (ref 12–28)
BUN: 25 mg/dL (ref 8–27)
Bilirubin Total: 0.4 mg/dL (ref 0.0–1.2)
CALCIUM: 9.5 mg/dL (ref 8.7–10.3)
CO2: 26 mmol/L (ref 20–29)
CREATININE: 0.71 mg/dL (ref 0.57–1.00)
Chloride: 102 mmol/L (ref 96–106)
GFR calc Af Amer: 100 mL/min/{1.73_m2} (ref >59)
GFR, EST NON AFRICAN AMERICAN: 87 mL/min/{1.73_m2} (ref >59)
GLOBULIN, TOTAL: 2.1 g/dL (ref 1.5–4.5)
Glucose: 88 mg/dL (ref 65–99)
POTASSIUM: 4.9 mmol/L (ref 3.5–5.2)
SODIUM: 141 mmol/L (ref 134–144)
Total Protein: 6.6 g/dL (ref 6.0–8.5)

## 2018-05-27 LAB — INSULIN, RANDOM: INSULIN: 8.4 u[IU]/mL (ref 2.6–24.9)

## 2018-05-27 LAB — VITAMIN D 25 HYDROXY (VIT D DEFICIENCY, FRACTURES): VIT D 25 HYDROXY: 43.6 ng/mL (ref 30.0–100.0)

## 2018-05-27 LAB — TSH: TSH: 3.03 u[IU]/mL (ref 0.450–4.500)

## 2018-05-27 LAB — HEMOGLOBIN A1C
ESTIMATED AVERAGE GLUCOSE: 108 mg/dL
HEMOGLOBIN A1C: 5.4 % (ref 4.8–5.6)

## 2018-05-27 LAB — T3: T3 TOTAL: 60 ng/dL — AB (ref 71–180)

## 2018-05-27 LAB — T4, FREE: FREE T4: 1.54 ng/dL (ref 0.82–1.77)

## 2018-06-09 ENCOUNTER — Encounter (INDEPENDENT_AMBULATORY_CARE_PROVIDER_SITE_OTHER): Payer: Self-pay | Admitting: Physician Assistant

## 2018-06-09 ENCOUNTER — Ambulatory Visit (INDEPENDENT_AMBULATORY_CARE_PROVIDER_SITE_OTHER): Payer: 59 | Admitting: Physician Assistant

## 2018-06-09 VITALS — BP 115/77 | HR 65 | Temp 97.7°F | Ht 60.0 in | Wt 222.0 lb

## 2018-06-09 DIAGNOSIS — R7303 Prediabetes: Secondary | ICD-10-CM | POA: Diagnosis not present

## 2018-06-09 DIAGNOSIS — Z6841 Body Mass Index (BMI) 40.0 and over, adult: Secondary | ICD-10-CM

## 2018-06-09 NOTE — Progress Notes (Signed)
Office: [phone]  /  Fax: [phone]   HPI:   Chief Complaint: OBESITY [name] [name] is here to discuss her progress with her obesity treatment plan. She is on the Category 2 plan and is following her eating plan approximately 85 % of the time. She states she is exercising 0 minutes 0 times per week. [name] [name] reports that she has been doing some extra snacking especially at night. She wants to talk about holiday eating strategies.  Her weight is 222 lb (100.7 kg) today and has not lost weight since her last visit. She has lost 31 lbs since starting treatment with [place].  Pre-Diabetes [name] [name] has a diagnosis of pre-diabetes based on her elevated Hgb A1c and was informed this puts her at greater risk of developing diabetes. Last A1c was 5.4. She denies polyphagia or hypoglycemia. She is on metformin and continues to work on diet and exercise to decrease risk of diabetes.  ALLERGIES: No Known Allergies  MEDICATIONS: Current Outpatient Medications on File Prior to Visit  Medication Sig Dispense Refill  . diclofenac sodium (VOLTAREN) 1 % GEL APPLY 4 GRAMS TOPICALLY TO  SKIN 4 TIMES DAILY. 400 g 1  . levothyroxine (SYNTHROID, LEVOTHROID) 112 MCG tablet Take 1 tablet (112 mcg total) by mouth daily. 30 tablet 0  . loperamide (IMODIUM) 2 MG capsule TAKE 1 CAPSULE BY MOUTH TWO TIMES DAILY 180 capsule 1  . metFORMIN (GLUCOPHAGE) 500 MG tablet Take 1 tablet (500 mg total) by mouth daily with breakfast. 30 tablet 0  . multivitamin (THERAGRAN) per tablet Take 1 tablet by mouth daily.      . sertraline (ZOLOFT) 50 MG tablet Take 1 tablet (50 mg total) by mouth daily. 90 tablet 1  . simvastatin (ZOCOR) 20 MG tablet Take 1 tablet (20 mg total) by mouth at bedtime. 90 tablet 1  . Ubiquinol 100 MG CAPS Take by mouth.    . Vitamin D, Ergocalciferol, (DRISDOL) 1.25 MG (50000 UT) CAPS capsule Take 1 capsule (50,000 Units total) by mouth every 7 (seven) days. 4 capsule 0   Current Facility-Administered  Medications on File Prior to Visit  Medication Dose Route Frequency Provider Last Rate Last Dose  . diclofenac sodium (VOLTAREN) 1 % transdermal gel 4 g  4 g Topical QID [name], DO        PAST MEDICAL HISTORY: Past Medical History:  Diagnosis Date  . Allergic rhinitis   . Arthritis   . Cataract   . Depression   . Emphysema lung (HCC)    Early stage  . Endometrial polyp   . Hyperlipidemia   . Hyperlipidemia    on simvastatin  . Hypothyroidism   . IBS (irritable bowel syndrome)   . Obesity   . Osteoarthritis   . Seasonal allergies     PAST SURGICAL HISTORY: Past Surgical History:  Procedure Laterality Date  . BUNIONECTOMY  2009   Dr.Duda, left  . EYE SURGERY    . HYSTEROSCOPY W/D&C  04/24/2011   Procedure: DILATATION AND CURETTAGE (D&C) /HYSTEROSCOPY;  Surgeon: Eldred Manges, MD;  Location: [facility];  Service: Gynecology;  Laterality: N/A;  . 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 PLANTAR FASCIA SURGERY  1994   Dr Percell Miller  . POLYPECTOMY  04/24/2011   Procedure: POLYPECTOMY;  Surgeon: Eldred Manges, MD;  Location: [facility];  Service: Gynecology;  Laterality: N/A;    SOCIAL HISTORY: Social History   Tobacco Use  . Smoking status: Former  Smoker    Packs/day: 2.00    Years: 42.00    Pack years: 84.00    Types: Cigarettes    Last attempt to quit: 07/28/2012    Years since quitting: 5.8  . Smokeless tobacco: Never Used  Substance Use Topics  . Alcohol use: Yes    Comment: rare  . Drug use: No    FAMILY HISTORY: Family History  Problem Relation Age of Onset  . Colon cancer Father 36  . Cancer Father 50       colon  . Alcoholism Father   . Pancreatic cancer Mother 34  . Cancer Mother 67       pancreatic  . Colon cancer Paternal Grandmother   . Cancer Paternal Grandmother 47       colon  . Heart attack Maternal Grandmother 50  . Heart disease Maternal Grandmother   . Cancer Sister 81       melanoma  . Breast cancer Sister     . Cancer Brother 1       skin  . Thyroid disease Sister   . Breast cancer Maternal Aunt 30  . Heart attack Maternal Aunt 60  . Breast cancer Sister     ROS: Review of Systems  Constitutional: Negative for weight loss.  Endo/Heme/Allergies:       Negative polyphagia Negative hypoglycemia    PHYSICAL EXAM: Blood pressure 115/77, pulse 65, temperature 97.7 F (36.5 C), temperature source Oral, height 5' (1.524 m), weight 222 lb (100.7 kg), SpO2 96 %. Body mass index is 43.36 kg/m. Physical Exam  Constitutional: She is oriented to person, place, and time. She appears well-developed and well-nourished.  Cardiovascular: Normal rate.  Pulmonary/Chest: Effort normal.  Musculoskeletal: Normal range of motion.  Neurological: She is oriented to person, place, and time.  Skin: Skin is warm and dry.  Psychiatric: She has a normal mood and affect. Her behavior is normal.  Vitals reviewed.   RECENT LABS AND TESTS: BMET    Component Value Date/Time   NA 141 05/26/2018 1218   K 4.9 05/26/2018 1218   CL 102 05/26/2018 1218   CO2 26 05/26/2018 1218   GLUCOSE 88 05/26/2018 1218   GLUCOSE 97 11/25/2017 1325   GLUCOSE 93 07/16/2006 1501   BUN 25 05/26/2018 1218   CREATININE 0.71 05/26/2018 1218   CALCIUM 9.5 05/26/2018 1218   GFRNONAA 87 05/26/2018 1218   GFRAA 100 05/26/2018 1218   Lab Results  Component Value Date   HGBA1C 5.4 05/26/2018   HGBA1C 5.7 (H) 02/17/2018   HGBA1C 5.8 02/14/2017   HGBA1C 5.5 01/23/2015   HGBA1C 5.7 02/24/2014   Lab Results  Component Value Date   INSULIN 8.4 05/26/2018   INSULIN 4.2 02/17/2018   CBC    Component Value Date/Time   WBC 6.0 05/26/2018 1218   WBC 3.7 (L) 11/25/2017 1325   RBC 4.95 05/26/2018 1218   RBC 4.58 11/25/2017 1325   HGB 14.9 05/26/2018 1218   HCT 45.3 05/26/2018 1218   PLT 164.0 11/25/2017 1325   MCV 92 05/26/2018 1218   MCH 30.1 05/26/2018 1218   MCH 31.1 04/17/2011 1358   MCHC 32.9 05/26/2018 1218   MCHC  33.4 11/25/2017 1325   RDW 13.9 05/26/2018 1218   LYMPHSABS 1.3 05/26/2018 1218   MONOABS 0.4 11/25/2017 1325   EOSABS 0.1 05/26/2018 1218   BASOSABS 0.0 05/26/2018 1218   Iron/TIBC/Ferritin/ %Sat No results found for: IRON, TIBC, FERRITIN, IRONPCTSAT Lipid Panel  Component Value Date/Time   CHOL 167 05/26/2018 1218   TRIG 67 05/26/2018 1218   HDL 74 05/26/2018 1218   CHOLHDL 2 11/25/2017 1325   VLDL 16.6 11/25/2017 1325   LDLCALC 80 05/26/2018 1218   LDLDIRECT 129.1 06/13/2011 1020   Hepatic Function Panel     Component Value Date/Time   PROT 6.6 05/26/2018 1218   ALBUMIN 4.5 05/26/2018 1218   AST 17 05/26/2018 1218   ALT 16 05/26/2018 1218   ALKPHOS 102 05/26/2018 1218   BILITOT 0.4 05/26/2018 1218   BILIDIR 0.1 12/23/2014 1031      Component Value Date/Time   TSH 3.030 05/26/2018 1218   TSH 6.930 (H) 02/17/2018 1113   TSH 3.48 11/25/2017 1325    ASSESSMENT AND PLAN: Prediabetes  Class 3 severe obesity with serious comorbidity and body mass index (BMI) of 40.0 to 44.9 in adult, unspecified obesity type ([place])  PLAN:  Pre-Diabetes [name] [name] will continue to work on weight loss, diet, exercise, and decreasing simple carbohydrates in her diet to help decrease the risk of diabetes. We dicussed metformin including benefits and risks. She was informed that eating too many simple carbohydrates or too many calories at one sitting increases the likelihood of GI side effects. [name] [name] agrees to continue taking metformin and she agrees to follow up with our clinic in 2 weeks as directed to monitor her progress.  I spent > than 50% of the 15 minute visit on counseling as documented in the note.  Obesity [name] [name] is currently in the action stage of change. As such, her goal is to continue with weight loss efforts She has agreed to follow the Category 2 plan [name] [name] has been instructed to work up to a goal of 150 minutes of combined cardio and strengthening exercise per week for  weight loss and overall health benefits. We discussed the following Behavioral Modification Strategies today: work on meal planning and easy cooking plans and holiday eating strategies    [name] [name] has agreed to follow up with our clinic in 2 weeks. She was informed of the importance of frequent follow up visits to maximize her success with intensive lifestyle modifications for her multiple health conditions.   OBESITY BEHAVIORAL INTERVENTION VISIT  Today's visit was # 9  Starting weight: 253 lbs Starting date: 02/17/18 Today's weight : 222 lbs  Today's date: 06/09/2018 Total lbs lost to date: 50    ASK: We discussed the diagnosis of obesity with [name] [name] today and [name] [name] agreed to give [place] permission to discuss obesity behavioral modification therapy today.  ASSESS: [name] [name] has the diagnosis of obesity and her BMI today is 43.36 [name] [name] is in the action stage of change   ADVISE: [name] [name] was educated on the multiple health risks of obesity as well as the benefit of weight loss to improve her health. She was advised of the need for long term treatment and the importance of lifestyle modifications.  AGREE: Multiple dietary modification options and treatment options were discussed and  [name] [name] agreed to the above obesity treatment plan.  [name] [name], am acting as transcriptionist for Abby Potash, PA-C I, Abby Potash, PA-C have reviewed above note and agree with its content

## 2018-06-23 ENCOUNTER — Ambulatory Visit (INDEPENDENT_AMBULATORY_CARE_PROVIDER_SITE_OTHER): Payer: 59 | Admitting: Physician Assistant

## 2018-06-23 ENCOUNTER — Other Ambulatory Visit (INDEPENDENT_AMBULATORY_CARE_PROVIDER_SITE_OTHER): Payer: Self-pay | Admitting: Physician Assistant

## 2018-06-23 ENCOUNTER — Encounter (INDEPENDENT_AMBULATORY_CARE_PROVIDER_SITE_OTHER): Payer: Self-pay | Admitting: Physician Assistant

## 2018-06-23 VITALS — BP 122/77 | HR 66 | Temp 97.6°F | Ht 60.0 in | Wt 221.0 lb

## 2018-06-23 DIAGNOSIS — E559 Vitamin D deficiency, unspecified: Secondary | ICD-10-CM

## 2018-06-23 DIAGNOSIS — Z6841 Body Mass Index (BMI) 40.0 and over, adult: Secondary | ICD-10-CM

## 2018-06-23 NOTE — Progress Notes (Signed)
Office: [phone]  /  Fax: [phone]   HPI:   Chief Complaint: OBESITY [name] [name] is here to discuss her progress with her obesity treatment plan. She is on the  follow the Category 2 plan and is following her eating plan approximately 70 % of the time. She states she is exercising 0 minutes 0 times per week. [name] [name] did well with weight loss. She reports portion controlling during holiday celebrations.  Her weight is 221 lb (100.2 kg) today and has had a weight loss of 1 pound over a period of 2 weeks since her last visit. She has lost 32 lbs since starting treatment with [place].  Vitamin D deficiency [name] [name] has a diagnosis of vitamin D deficiency. She is currently taking vit D and denies nausea, vomiting or muscle weakness.  Ref. Range 05/26/2018 12:18  Vitamin D, 25-Hydroxy Latest Ref Range: 30.0 - 100.0 ng/mL 43.6   ALLERGIES: No Known Allergies  MEDICATIONS: Current Outpatient Medications on File Prior to Visit  Medication Sig Dispense Refill  . diclofenac sodium (VOLTAREN) 1 % GEL APPLY 4 GRAMS TOPICALLY TO  SKIN 4 TIMES DAILY. 400 g 1  . levothyroxine (SYNTHROID, LEVOTHROID) 112 MCG tablet Take 1 tablet (112 mcg total) by mouth daily. 30 tablet 0  . loperamide (IMODIUM) 2 MG capsule TAKE 1 CAPSULE BY MOUTH TWO TIMES DAILY 180 capsule 1  . metFORMIN (GLUCOPHAGE) 500 MG tablet Take 1 tablet (500 mg total) by mouth daily with breakfast. 30 tablet 0  . multivitamin (THERAGRAN) per tablet Take 1 tablet by mouth daily.      . sertraline (ZOLOFT) 50 MG tablet Take 1 tablet (50 mg total) by mouth daily. 90 tablet 1  . simvastatin (ZOCOR) 20 MG tablet Take 1 tablet (20 mg total) by mouth at bedtime. 90 tablet 1  . Ubiquinol 100 MG CAPS Take by mouth.    . Vitamin D, Ergocalciferol, (DRISDOL) 1.25 MG (50000 UT) CAPS capsule Take 1 capsule (50,000 Units total) by mouth every 7 (seven) days. 4 capsule 0   Current Facility-Administered Medications on File Prior to Visit  Medication Dose  Route Frequency Provider Last Rate Last Dose  . diclofenac sodium (VOLTAREN) 1 % transdermal gel 4 g  4 g Topical QID [name], DO        PAST MEDICAL HISTORY: Past Medical History:  Diagnosis Date  . Allergic rhinitis   . Arthritis   . Cataract   . Depression   . Emphysema lung (HCC)    Early stage  . Endometrial polyp   . Hyperlipidemia   . Hyperlipidemia    on simvastatin  . Hypothyroidism   . IBS (irritable bowel syndrome)   . Obesity   . Osteoarthritis   . Seasonal allergies     PAST SURGICAL HISTORY: Past Surgical History:  Procedure Laterality Date  . BUNIONECTOMY  2009   Dr.Duda, left  . EYE SURGERY    . HYSTEROSCOPY W/D&C  04/24/2011   Procedure: DILATATION AND CURETTAGE (D&C) /HYSTEROSCOPY;  Surgeon: Eldred Manges, MD;  Location: [facility];  Service: Gynecology;  Laterality: N/A;  . 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 PLANTAR FASCIA SURGERY  1994   Dr Percell Miller  . POLYPECTOMY  04/24/2011   Procedure: POLYPECTOMY;  Surgeon: Eldred Manges, MD;  Location: [facility];  Service: Gynecology;  Laterality: N/A;    SOCIAL HISTORY: Social History   Tobacco Use  . Smoking status: Former Smoker  Packs/day: 2.00    Years: 42.00    Pack years: 84.00    Types: Cigarettes    Last attempt to quit: 07/28/2012    Years since quitting: 5.9  . Smokeless tobacco: Never Used  Substance Use Topics  . Alcohol use: Yes    Comment: rare  . Drug use: No    FAMILY HISTORY: Family History  Problem Relation Age of Onset  . Colon cancer Father 34  . Cancer Father 57       colon  . Alcoholism Father   . Pancreatic cancer Mother 73  . Cancer Mother 59       pancreatic  . Colon cancer Paternal Grandmother   . Cancer Paternal Grandmother 76       colon  . Heart attack Maternal Grandmother 50  . Heart disease Maternal Grandmother   . Cancer Sister 89       melanoma  . Breast cancer Sister   . Cancer Brother 66       skin  . Thyroid disease  Sister   . Breast cancer Maternal Aunt 30  . Heart attack Maternal Aunt 60  . Breast cancer Sister     ROS: Review of Systems  Constitutional: Positive for weight loss.  Gastrointestinal: Negative for nausea and vomiting.  Musculoskeletal:       Negative for muscle weakness    PHYSICAL EXAM: Blood pressure 122/77, pulse 66, temperature 97.6 F (36.4 C), temperature source Oral, height 5' (1.524 m), weight 221 lb (100.2 kg), SpO2 97 %. Body mass index is 43.16 kg/m. Physical Exam  Constitutional: She is oriented to person, place, and time. She appears well-developed and well-nourished.  HENT:  Head: Normocephalic.  Eyes: Pupils are equal, round, and reactive to light.  Neck: Normal range of motion.  Cardiovascular: Normal rate.  Pulmonary/Chest: Effort normal.  Musculoskeletal: Normal range of motion.  Neurological: She is alert and oriented to person, place, and time.  Skin: Skin is warm and dry.  Psychiatric: She has a normal mood and affect. Her behavior is normal.  Nursing note and vitals reviewed.   RECENT LABS AND TESTS: BMET    Component Value Date/Time   NA 141 05/26/2018 1218   K 4.9 05/26/2018 1218   CL 102 05/26/2018 1218   CO2 26 05/26/2018 1218   GLUCOSE 88 05/26/2018 1218   GLUCOSE 97 11/25/2017 1325   GLUCOSE 93 07/16/2006 1501   BUN 25 05/26/2018 1218   CREATININE 0.71 05/26/2018 1218   CALCIUM 9.5 05/26/2018 1218   GFRNONAA 87 05/26/2018 1218   GFRAA 100 05/26/2018 1218   Lab Results  Component Value Date   HGBA1C 5.4 05/26/2018   HGBA1C 5.7 (H) 02/17/2018   HGBA1C 5.8 02/14/2017   HGBA1C 5.5 01/23/2015   HGBA1C 5.7 02/24/2014   Lab Results  Component Value Date   INSULIN 8.4 05/26/2018   INSULIN 4.2 02/17/2018   CBC    Component Value Date/Time   WBC 6.0 05/26/2018 1218   WBC 3.7 (L) 11/25/2017 1325   RBC 4.95 05/26/2018 1218   RBC 4.58 11/25/2017 1325   HGB 14.9 05/26/2018 1218   HCT 45.3 05/26/2018 1218   PLT 164.0 11/25/2017  1325   MCV 92 05/26/2018 1218   MCH 30.1 05/26/2018 1218   MCH 31.1 04/17/2011 1358   MCHC 32.9 05/26/2018 1218   MCHC 33.4 11/25/2017 1325   RDW 13.9 05/26/2018 1218   LYMPHSABS 1.3 05/26/2018 1218   MONOABS 0.4 11/25/2017 1325  EOSABS 0.1 05/26/2018 1218   BASOSABS 0.0 05/26/2018 1218   Iron/TIBC/Ferritin/ %Sat No results found for: IRON, TIBC, FERRITIN, IRONPCTSAT Lipid Panel     Component Value Date/Time   CHOL 167 05/26/2018 1218   TRIG 67 05/26/2018 1218   HDL 74 05/26/2018 1218   CHOLHDL 2 11/25/2017 1325   VLDL 16.6 11/25/2017 1325   LDLCALC 80 05/26/2018 1218   LDLDIRECT 129.1 06/13/2011 1020   Hepatic Function Panel     Component Value Date/Time   PROT 6.6 05/26/2018 1218   ALBUMIN 4.5 05/26/2018 1218   AST 17 05/26/2018 1218   ALT 16 05/26/2018 1218   ALKPHOS 102 05/26/2018 1218   BILITOT 0.4 05/26/2018 1218   BILIDIR 0.1 12/23/2014 1031      Component Value Date/Time   TSH 3.030 05/26/2018 1218   TSH 6.930 (H) 02/17/2018 1113   TSH 3.48 11/25/2017 1325    Ref. Range 05/26/2018 12:18  Vitamin D, 25-Hydroxy Latest Ref Range: 30.0 - 100.0 ng/mL 43.6    ASSESSMENT AND PLAN: Vitamin D deficiency  Class 3 severe obesity with serious comorbidity and body mass index (BMI) of 40.0 to 44.9 in adult, unspecified obesity type ([place])  PLAN: Vitamin D Deficiency Peaches was informed that low vitamin D levels contributes to fatigue and are associated with obesity, breast, and colon cancer. She agrees to continue to take prescription Vit D @50 ,000 IU every week and will follow up for routine testing of vitamin D, at least 2-3 times per year. She was informed of the risk of over-replacement of vitamin D and agrees to not increase her dose unless she discusses this with [place] first. Agrees to follow up with our clinic as directed.   I spent > than 50% of the 15 minute visit on counseling as documented in the note.  Obesity Jihan is currently in the action stage of  change. As such, her goal is to continue with weight loss efforts She has agreed to follow the Category 2 plan Kaedance has been instructed to work up to a goal of 150 minutes of combined cardio and strengthening exercise per week for weight loss and overall health benefits. We discussed the following Behavioral Modification Strategies today: work on meal planning and easy cooking plans and holiday eating strategies    Ralyn has agreed to follow up with our clinic in 2 weeks. She was informed of the importance of frequent follow up visits to maximize her success with intensive lifestyle modifications for her multiple health conditions.   OBESITY BEHAVIORAL INTERVENTION VISIT  Today's visit was # 10   Starting weight: 253 lb Starting date: 02/17/18 Today's weight : Weight: 221 lb (100.2 kg)  Today's date: 06/23/2018 Total lbs lost to date: 32 lb    ASK: We discussed the diagnosis of obesity with Sherol Dade today and Iza agreed to give [place] permission to discuss obesity behavioral modification therapy today.  ASSESS: Bodhi has the diagnosis of obesity and her BMI today is 43.16 Floye is in the action stage of change   ADVISE: Mitzie was educated on the multiple health risks of obesity as well as the benefit of weight loss to improve her health. She was advised of the need for long term treatment and the importance of lifestyle modifications to improve her current health and to decrease her risk of future health problems.  AGREE: Multiple dietary modification options and treatment options were discussed and  Zamarah agreed to follow the recommendations documented in the above  note.  ARRANGE: Jacelynn was educated on the importance of frequent visits to treat obesity as outlined per CMS and USPSTF guidelines and agreed to schedule her next follow up appointment today.  Leary Roca, am acting as transcriptionist for Abby Potash, PA-C I, Abby Potash, PA-C have reviewed  above note and agree with its content

## 2018-06-24 MED ORDER — VITAMIN D (ERGOCALCIFEROL) 1.25 MG (50000 UNIT) PO CAPS: 50000.0000 [IU] | ORAL_CAPSULE | ORAL | 0 refills | Status: DC

## 2018-07-06 ENCOUNTER — Ambulatory Visit (INDEPENDENT_AMBULATORY_CARE_PROVIDER_SITE_OTHER): Payer: 59 | Admitting: Physician Assistant

## 2018-07-06 VITALS — BP 126/77 | HR 70 | Temp 97.9°F | Ht 60.0 in | Wt 221.0 lb

## 2018-07-06 DIAGNOSIS — E559 Vitamin D deficiency, unspecified: Secondary | ICD-10-CM

## 2018-07-06 DIAGNOSIS — Z6841 Body Mass Index (BMI) 40.0 and over, adult: Secondary | ICD-10-CM

## 2018-07-06 DIAGNOSIS — Z9189 Other specified personal risk factors, not elsewhere classified: Secondary | ICD-10-CM | POA: Diagnosis not present

## 2018-07-06 DIAGNOSIS — E038 Other specified hypothyroidism: Secondary | ICD-10-CM

## 2018-07-06 MED ORDER — LEVOTHYROXINE SODIUM 112 MCG PO TABS: 112.0000 ug | ORAL_TABLET | Freq: Every day | ORAL | 0 refills | Status: DC

## 2018-07-06 MED ORDER — VITAMIN D (ERGOCALCIFEROL) 1.25 MG (50000 UNIT) PO CAPS: 50000.0000 [IU] | ORAL_CAPSULE | ORAL | 0 refills | Status: DC

## 2018-07-07 NOTE — Progress Notes (Signed)
Office: [phone]  /  Fax: [phone]   HPI:   Chief Complaint: OBESITY [name] [name] is here to discuss her progress with her obesity treatment plan. She is on the  follow the Category 2 plan and is following her eating plan approximately 60 % of the time. She states she is exercising 0 minutes 0 times per week. [name] [name] did well with maintaning her weight. She reports portion control over the holidays. She is eating out at [employer] more often.  Her weight is 221 lb (100.2 kg) today and has maintained since her last visit. She has lost 32 lbs since starting treatment with [place].  Hypothyroid [name] [name] has a diagnosis of hypothyroidism. She is on levothyroxine. She denies hot or cold intolerance or palpitations, but does admit to ongoing fatigue.  Vitamin D deficiency [name] [name] has a diagnosis of vitamin D deficiency. She is currently taking vit D and denies nausea, vomiting or muscle weakness.  At risk for osteopenia and osteoporosis [name] [name] was given extended  (15 minutes) osteoporosis prevention counseling today. [name] [name] is at risk for osteopenia and osteoporsis due to her vitamin D deficiency. She was encouraged to take her vitamin D and follow her higher calcium diet and increase strengthening exercise to help strengthen her bones and decrease her risk of osteopenia and osteoporosis.  ASSESSMENT AND PLAN:  Other specified hypothyroidism - Plan: levothyroxine (SYNTHROID, LEVOTHROID) 112 MCG tablet  Vitamin D deficiency - Plan: Vitamin D, Ergocalciferol, (DRISDOL) 1.25 MG (50000 UT) CAPS capsule  At risk for osteoporosis  Class 3 severe obesity with serious comorbidity and body mass index (BMI) of 40.0 to 44.9 in adult, unspecified obesity type [facility])  PLAN: Hypothyroid [name] [name] was informed of the importance of good thyroid control to help with weight loss efforts. She was also informed that supertheraputic thyroid levels are dangerous and will not improve weight loss results. A  prescription was written today for a 30 day supply of her Levothyroxine.   Vitamin D Deficiency [name] [name] was informed that low vitamin D levels contributes to fatigue and are associated with obesity, breast, and colon cancer. She agrees to continue to take prescription Vit D @50 ,000 IU every week in which a prescription was written today for a 30 day supply and will follow up for routine testing of vitamin D, at least 2-3 times per year. She was informed of the risk of over-replacement of vitamin D and agrees to not increase her dose unless she discusses this with [place] first.  At risk for osteopenia and osteoporosis [name] [name] was given extended  (15 minutes) osteoporosis prevention counseling today. [name] [name] is at risk for osteopenia and osteoporsis due to her vitamin D deficiency. She was encouraged to take her vitamin D and follow her higher calcium diet and increase strengthening exercise to help strengthen her bones and decrease her risk of osteopenia and osteoporosis.  Obesity [name] [name] is currently in the action stage of change. As such, her goal is to continue with weight loss efforts She has agreed to follow the Category 2 plan [name] [name] has been instructed to work up to a goal of 150 minutes of combined cardio and strengthening exercise per week for weight loss and overall health benefits. We discussed the following Behavioral Modification Stratagies today: work on meal planning and easy cooking plans and holiday eating strategies    [name] [name] has agreed to follow up with our clinic in 3 weeks. She was informed of the importance of frequent follow up visits to maximize her success with intensive lifestyle  modifications for her multiple health conditions.  ALLERGIES: No Known Allergies  MEDICATIONS: Current Outpatient Medications on File Prior to Visit  Medication Sig Dispense Refill  . diclofenac sodium (VOLTAREN) 1 % GEL APPLY 4 GRAMS TOPICALLY TO  SKIN 4 TIMES DAILY. 400 g 1  . loperamide  (IMODIUM) 2 MG capsule TAKE 1 CAPSULE BY MOUTH TWO TIMES DAILY 180 capsule 1  . metFORMIN (GLUCOPHAGE) 500 MG tablet Take 1 tablet (500 mg total) by mouth daily with breakfast. 30 tablet 0  . multivitamin (THERAGRAN) per tablet Take 1 tablet by mouth daily.      . sertraline (ZOLOFT) 50 MG tablet Take 1 tablet (50 mg total) by mouth daily. 90 tablet 1  . simvastatin (ZOCOR) 20 MG tablet Take 1 tablet (20 mg total) by mouth at bedtime. 90 tablet 1  . Ubiquinol 100 MG CAPS Take by mouth.     Current Facility-Administered Medications on File Prior to Visit  Medication Dose Route Frequency Provider Last Rate Last Dose  . diclofenac sodium (VOLTAREN) 1 % transdermal gel 4 g  4 g Topical QID [name], DO        PAST MEDICAL HISTORY: Past Medical History:  Diagnosis Date  . Allergic rhinitis   . Arthritis   . Cataract   . Depression   . Emphysema lung (HCC)    Early stage  . Endometrial polyp   . Hyperlipidemia   . Hyperlipidemia    on simvastatin  . Hypothyroidism   . IBS (irritable bowel syndrome)   . Obesity   . Osteoarthritis   . Seasonal allergies     PAST SURGICAL HISTORY: Past Surgical History:  Procedure Laterality Date  . BUNIONECTOMY  2009   Dr.Duda, left  . EYE SURGERY    . HYSTEROSCOPY W/D&C  04/24/2011   Procedure: DILATATION AND CURETTAGE (D&C) /HYSTEROSCOPY;  Surgeon: Eldred Manges, MD;  Location: [facility];  Service: Gynecology;  Laterality: N/A;  . 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 PLANTAR FASCIA SURGERY  1994   Dr Percell Miller  . POLYPECTOMY  04/24/2011   Procedure: POLYPECTOMY;  Surgeon: Eldred Manges, MD;  Location: [facility];  Service: Gynecology;  Laterality: N/A;    SOCIAL HISTORY: Social History   Tobacco Use  . Smoking status: Former Smoker    Packs/day: 2.00    Years: 42.00    Pack years: 84.00    Types: Cigarettes    Last attempt to quit: 07/28/2012    Years since quitting: 5.9  . Smokeless tobacco: Never Used    Substance Use Topics  . Alcohol use: Yes    Comment: rare  . Drug use: No    FAMILY HISTORY: Family History  Problem Relation Age of Onset  . Colon cancer Father 58  . Cancer Father 75       colon  . Alcoholism Father   . Pancreatic cancer Mother 84  . Cancer Mother 61       pancreatic  . Colon cancer Paternal Grandmother   . Cancer Paternal Grandmother 71       colon  . Heart attack Maternal Grandmother 50  . Heart disease Maternal Grandmother   . Cancer Sister 39       melanoma  . Breast cancer Sister   . Cancer Brother 78       skin  . Thyroid disease Sister   . Breast cancer Maternal Aunt 30  . Heart attack Maternal  Aunt 60  . Breast cancer Sister     ROS: Review of Systems  All other systems reviewed and are negative.   PHYSICAL EXAM: Blood pressure 126/77, pulse 70, temperature 97.9 F (36.6 C), temperature source Oral, height 5' (1.524 m), weight 221 lb (100.2 kg), SpO2 97 %. Body mass index is 43.16 kg/m. Physical Exam Vitals signs reviewed.  HENT:     Head: Normocephalic.  Eyes:     Extraocular Movements: Extraocular movements intact.  Neck:     Musculoskeletal: Normal range of motion.  Cardiovascular:     Rate and Rhythm: Normal rate.  Musculoskeletal: Normal range of motion.  Skin:    General: Skin is warm and dry.  Neurological:     Mental Status: She is alert and oriented to person, place, and time.  Psychiatric:        Behavior: Behavior normal.     RECENT LABS AND TESTS: BMET    Component Value Date/Time   NA 141 05/26/2018 1218   K 4.9 05/26/2018 1218   CL 102 05/26/2018 1218   CO2 26 05/26/2018 1218   GLUCOSE 88 05/26/2018 1218   GLUCOSE 97 11/25/2017 1325   GLUCOSE 93 07/16/2006 1501   BUN 25 05/26/2018 1218   CREATININE 0.71 05/26/2018 1218   CALCIUM 9.5 05/26/2018 1218   GFRNONAA 87 05/26/2018 1218   GFRAA 100 05/26/2018 1218   Lab Results  Component Value Date   HGBA1C 5.4 05/26/2018   HGBA1C 5.7 (H) 02/17/2018    HGBA1C 5.8 02/14/2017   HGBA1C 5.5 01/23/2015   HGBA1C 5.7 02/24/2014   Lab Results  Component Value Date   INSULIN 8.4 05/26/2018   INSULIN 4.2 02/17/2018   CBC    Component Value Date/Time   WBC 6.0 05/26/2018 1218   WBC 3.7 (L) 11/25/2017 1325   RBC 4.95 05/26/2018 1218   RBC 4.58 11/25/2017 1325   HGB 14.9 05/26/2018 1218   HCT 45.3 05/26/2018 1218   PLT 164.0 11/25/2017 1325   MCV 92 05/26/2018 1218   MCH 30.1 05/26/2018 1218   MCH 31.1 04/17/2011 1358   MCHC 32.9 05/26/2018 1218   MCHC 33.4 11/25/2017 1325   RDW 13.9 05/26/2018 1218   LYMPHSABS 1.3 05/26/2018 1218   MONOABS 0.4 11/25/2017 1325   EOSABS 0.1 05/26/2018 1218   BASOSABS 0.0 05/26/2018 1218   Iron/TIBC/Ferritin/ %Sat No results found for: IRON, TIBC, FERRITIN, IRONPCTSAT Lipid Panel     Component Value Date/Time   CHOL 167 05/26/2018 1218   TRIG 67 05/26/2018 1218   HDL 74 05/26/2018 1218   CHOLHDL 2 11/25/2017 1325   VLDL 16.6 11/25/2017 1325   LDLCALC 80 05/26/2018 1218   LDLDIRECT 129.1 06/13/2011 1020   Hepatic Function Panel     Component Value Date/Time   PROT 6.6 05/26/2018 1218   ALBUMIN 4.5 05/26/2018 1218   AST 17 05/26/2018 1218   ALT 16 05/26/2018 1218   ALKPHOS 102 05/26/2018 1218   BILITOT 0.4 05/26/2018 1218   BILIDIR 0.1 12/23/2014 1031      Component Value Date/Time   TSH 3.030 05/26/2018 1218   TSH 6.930 (H) 02/17/2018 1113   TSH 3.48 11/25/2017 1325      OBESITY BEHAVIORAL INTERVENTION VISIT  Today's visit was # 11   Starting weight: 253 lbs Starting date: 02/17/18 Today's weight : Weight: 221 lb (100.2 kg)  Today's date: 07/06/18 Total lbs lost to date: 41   ASK: We discussed the diagnosis of obesity with [name] [name]  [name] [name] today and [name] [name] agreed to give [place] permission to discuss obesity behavioral modification therapy today.  ASSESS: [name] [name] has the diagnosis of obesity and her BMI today is 43.3 [name] [name] is in the action stage of change   ADVISE: [name] [name]  was educated on the multiple health risks of obesity as well as the benefit of weight loss to improve her health. She was advised of the need for long term treatment and the importance of lifestyle modifications to improve her current health and to decrease her risk of future health problems.  AGREE: Multiple dietary modification options and treatment options were discussed and  [name] [name] agreed to follow the recommendations documented in the above note.  ARRANGE: [name] [name] was educated on the importance of frequent visits to treat obesity as outlined per CMS and USPSTF guidelines and agreed to schedule her next follow up appointment today.  I, April Moore, am acting as Location manager for [employer], PA-C I, Abby Potash, PA-C have reviewed above note and agree with its content

## 2018-07-27 ENCOUNTER — Ambulatory Visit (INDEPENDENT_AMBULATORY_CARE_PROVIDER_SITE_OTHER): Payer: 59 | Admitting: Physician Assistant

## 2018-07-27 ENCOUNTER — Encounter (INDEPENDENT_AMBULATORY_CARE_PROVIDER_SITE_OTHER): Payer: Self-pay | Admitting: Physician Assistant

## 2018-07-27 VITALS — BP 111/72 | HR 66 | Temp 97.9°F | Ht 60.0 in | Wt 219.0 lb

## 2018-07-27 DIAGNOSIS — E7849 Other hyperlipidemia: Secondary | ICD-10-CM | POA: Diagnosis not present

## 2018-07-27 DIAGNOSIS — Z6841 Body Mass Index (BMI) 40.0 and over, adult: Secondary | ICD-10-CM

## 2018-07-27 DIAGNOSIS — E559 Vitamin D deficiency, unspecified: Secondary | ICD-10-CM | POA: Diagnosis not present

## 2018-07-27 DIAGNOSIS — Z9189 Other specified personal risk factors, not elsewhere classified: Secondary | ICD-10-CM

## 2018-07-27 MED ORDER — VITAMIN D (ERGOCALCIFEROL) 1.25 MG (50000 UNIT) PO CAPS: 50000.0000 [IU] | ORAL_CAPSULE | ORAL | 0 refills | Status: DC

## 2018-07-28 NOTE — Progress Notes (Signed)
Office: [phone]  /  Fax: [phone]   HPI:   Chief Complaint: OBESITY [name] [name] is here to discuss her progress with her obesity treatment plan. She is on the Category 2 plan and is following her eating plan approximately 60 % of the time. She states she is exercising 0 minutes 0 times per week. [name] [name] did well with weight loss. She reports getting back on track again after the holidays.  Her weight is 219 lb (99.3 kg) today and has had a weight loss of 2 pounds over a period of 3 weeks since her last visit. She has lost 34 lbs since starting treatment with [place].  Vitamin D deficiency [name] [name] has a diagnosis of vitamin D deficiency. She is currently taking vit D and denies nausea, vomiting, or muscle weakness.  Hyperlipidemia [name] [name] has hyperlipidemia and has been trying to improve her cholesterol levels with intensive lifestyle modification including a low saturated fat diet, exercise and weight loss. She is on Zocor and denies any chest pain.  At risk for cardiovascular disease [name] [name] is at a higher than average risk for cardiovascular disease due to hyperlipidemia and obesity. She currently denies any chest pain.  ASSESSMENT AND PLAN:  Vitamin D deficiency - Plan: Vitamin D, Ergocalciferol, (DRISDOL) 1.25 MG (50000 UT) CAPS capsule  Other hyperlipidemia  At risk for heart disease  Class 3 severe obesity with serious comorbidity and body mass index (BMI) of 40.0 to 44.9 in adult, unspecified obesity type ([place])  PLAN:  Vitamin D Deficiency [name] [name] was informed that low vitamin D levels contributes to fatigue and are associated with obesity, breast, and colon cancer. She agrees to continue to take prescription Vit D @50 ,000 IU every week #4 with no refills and will follow up for routine testing of vitamin D, at least 2-3 times per year. She was informed of the risk of over-replacement of vitamin D and agrees to not increase her dose unless she discusses this with [place] first. [name] [name]  agrees to follow up in 2 weeks.  Hyperlipidemia [name] [name] was informed of the American Heart Association Guidelines emphasizing intensive lifestyle modifications as the first line treatment for hyperlipidemia. We discussed many lifestyle modifications today in depth, and [name] [name] will continue to work on decreasing saturated fats such as fatty red meat, butter and many fried foods. She will also increase vegetables and lean protein in her diet and continue to work on exercise and weight loss efforts. [name] [name] agrees to continue with her medications and weight loss. She will follow up at the agreed upon time.  Cardiovascular risk counseling [name] [name] was given extended (15 minutes) coronary artery disease prevention counseling today. She is 73 y.o. female and has risk factors for heart disease including hyperlipidemia and obesity. We discussed intensive lifestyle modifications today with an emphasis on specific weight loss instructions and strategies. Pt was also informed of the importance of increasing exercise and decreasing saturated fats to help prevent heart disease.  Obesity [name] [name] is currently in the action stage of change. As such, her goal is to continue with weight loss efforts. She has agreed to follow the Category 2 plan. [name] [name] has been instructed to work up to a goal of 150 minutes of combined cardio and strengthening exercise per week for weight loss and overall health benefits. We discussed the following Behavioral Modification Strategies today: work on meal planning and easy cooking plans and planning for success.  [name] [name] has agreed to follow up with our clinic in 2 weeks. She was informed of  the importance of frequent follow up visits to maximize her success with intensive lifestyle modifications for her multiple health conditions.  ALLERGIES: No Known Allergies  MEDICATIONS: Current Outpatient Medications on File Prior to Visit  Medication Sig Dispense Refill  . diclofenac sodium  (VOLTAREN) 1 % GEL APPLY 4 GRAMS TOPICALLY TO  SKIN 4 TIMES DAILY. 400 g 1  . levothyroxine (SYNTHROID, LEVOTHROID) 112 MCG tablet Take 1 tablet (112 mcg total) by mouth daily. 30 tablet 0  . loperamide (IMODIUM) 2 MG capsule TAKE 1 CAPSULE BY MOUTH TWO TIMES DAILY 180 capsule 1  . metFORMIN (GLUCOPHAGE) 500 MG tablet Take 1 tablet (500 mg total) by mouth daily with breakfast. 30 tablet 0  . multivitamin (THERAGRAN) per tablet Take 1 tablet by mouth daily.      . sertraline (ZOLOFT) 50 MG tablet Take 1 tablet (50 mg total) by mouth daily. 90 tablet 1  . simvastatin (ZOCOR) 20 MG tablet Take 1 tablet (20 mg total) by mouth at bedtime. 90 tablet 1  . Ubiquinol 100 MG CAPS Take by mouth.     Current Facility-Administered Medications on File Prior to Visit  Medication Dose Route Frequency Provider Last Rate Last Dose  . diclofenac sodium (VOLTAREN) 1 % transdermal gel 4 g  4 g Topical QID [name], DO        PAST MEDICAL HISTORY: Past Medical History:  Diagnosis Date  . Allergic rhinitis   . Arthritis   . Cataract   . Depression   . Emphysema lung (HCC)    Early stage  . Endometrial polyp   . Hyperlipidemia   . Hyperlipidemia    on simvastatin  . Hypothyroidism   . IBS (irritable bowel syndrome)   . Obesity   . Osteoarthritis   . Seasonal allergies     PAST SURGICAL HISTORY: Past Surgical History:  Procedure Laterality Date  . BUNIONECTOMY  2009   Dr.Duda, left  . EYE SURGERY    . HYSTEROSCOPY W/D&C  04/24/2011   Procedure: DILATATION AND CURETTAGE (D&C) /HYSTEROSCOPY;  Surgeon: Eldred Manges, MD;  Location: [facility];  Service: Gynecology;  Laterality: N/A;  . 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 PLANTAR FASCIA SURGERY  1994   Dr Percell Miller  . POLYPECTOMY  04/24/2011   Procedure: POLYPECTOMY;  Surgeon: Eldred Manges, MD;  Location: [facility];  Service: Gynecology;  Laterality: N/A;    SOCIAL HISTORY: Social History   Tobacco Use  . Smoking  status: Former Smoker    Packs/day: 2.00    Years: 42.00    Pack years: 84.00    Types: Cigarettes    Last attempt to quit: 07/28/2012    Years since quitting: 6.0  . Smokeless tobacco: Never Used  Substance Use Topics  . Alcohol use: Yes    Comment: rare  . Drug use: No    FAMILY HISTORY: Family History  Problem Relation Age of Onset  . Colon cancer Father 6  . Cancer Father 20       colon  . Alcoholism Father   . Pancreatic cancer Mother 16  . Cancer Mother 34       pancreatic  . Colon cancer Paternal Grandmother   . Cancer Paternal Grandmother 44       colon  . Heart attack Maternal Grandmother 50  . Heart disease Maternal Grandmother   . Cancer Sister 17       melanoma  . Breast  cancer Sister   . Cancer Brother 50       skin  . Thyroid disease Sister   . Breast cancer Maternal Aunt 30  . Heart attack Maternal Aunt 60  . Breast cancer Sister     ROS: Review of Systems  Constitutional: Positive for weight loss.  Cardiovascular: Negative for chest pain.  Gastrointestinal: Negative for nausea and vomiting.  Musculoskeletal:       Negative for muscle weakness.    PHYSICAL EXAM: Blood pressure 111/72, pulse 66, temperature 97.9 F (36.6 C), temperature source Oral, height 5' (1.524 m), weight 219 lb (99.3 kg), SpO2 97 %. Body mass index is 42.77 kg/m. Physical Exam Vitals signs reviewed.  Constitutional:      Appearance: Normal appearance. She is obese.  Cardiovascular:     Rate and Rhythm: Normal rate.  Pulmonary:     Effort: Pulmonary effort is normal.  Musculoskeletal: Normal range of motion.  Skin:    General: Skin is warm and dry.  Neurological:     Mental Status: She is alert and oriented to person, place, and time.  Psychiatric:        Mood and Affect: Mood normal.        Behavior: Behavior normal.     RECENT LABS AND TESTS: BMET    Component Value Date/Time   NA 141 05/26/2018 1218   K 4.9 05/26/2018 1218   CL 102 05/26/2018 1218    CO2 26 05/26/2018 1218   GLUCOSE 88 05/26/2018 1218   GLUCOSE 97 11/25/2017 1325   GLUCOSE 93 07/16/2006 1501   BUN 25 05/26/2018 1218   CREATININE 0.71 05/26/2018 1218   CALCIUM 9.5 05/26/2018 1218   GFRNONAA 87 05/26/2018 1218   GFRAA 100 05/26/2018 1218   Lab Results  Component Value Date   HGBA1C 5.4 05/26/2018   HGBA1C 5.7 (H) 02/17/2018   HGBA1C 5.8 02/14/2017   HGBA1C 5.5 01/23/2015   HGBA1C 5.7 02/24/2014   Lab Results  Component Value Date   INSULIN 8.4 05/26/2018   INSULIN 4.2 02/17/2018   CBC    Component Value Date/Time   WBC 6.0 05/26/2018 1218   WBC 3.7 (L) 11/25/2017 1325   RBC 4.95 05/26/2018 1218   RBC 4.58 11/25/2017 1325   HGB 14.9 05/26/2018 1218   HCT 45.3 05/26/2018 1218   PLT 164.0 11/25/2017 1325   MCV 92 05/26/2018 1218   MCH 30.1 05/26/2018 1218   MCH 31.1 04/17/2011 1358   MCHC 32.9 05/26/2018 1218   MCHC 33.4 11/25/2017 1325   RDW 13.9 05/26/2018 1218   LYMPHSABS 1.3 05/26/2018 1218   MONOABS 0.4 11/25/2017 1325   EOSABS 0.1 05/26/2018 1218   BASOSABS 0.0 05/26/2018 1218   Iron/TIBC/Ferritin/ %Sat No results found for: IRON, TIBC, FERRITIN, IRONPCTSAT Lipid Panel     Component Value Date/Time   CHOL 167 05/26/2018 1218   TRIG 67 05/26/2018 1218   HDL 74 05/26/2018 1218   CHOLHDL 2 11/25/2017 1325   VLDL 16.6 11/25/2017 1325   LDLCALC 80 05/26/2018 1218   LDLDIRECT 129.1 06/13/2011 1020   Hepatic Function Panel     Component Value Date/Time   PROT 6.6 05/26/2018 1218   ALBUMIN 4.5 05/26/2018 1218   AST 17 05/26/2018 1218   ALT 16 05/26/2018 1218   ALKPHOS 102 05/26/2018 1218   BILITOT 0.4 05/26/2018 1218   BILIDIR 0.1 12/23/2014 1031      Component Value Date/Time   TSH 3.030 05/26/2018 1218   TSH  6.930 (H) 02/17/2018 1113   TSH 3.48 11/25/2017 1325   Results for CHAQUITA, BASQUES (MRN [id number]) as of 07/28/2018 10:45  Ref. Range 05/26/2018 12:18  Vitamin D, 25-Hydroxy Latest Ref Range: 30.0 - 100.0 ng/mL 43.6     OBESITY BEHAVIORAL INTERVENTION VISIT  Today's visit was # 12   Starting weight: 253 lbs Starting date: 02/17/18 Today's weight : Weight: 219 lb (99.3 kg)  Today's date: 07/27/2018 Total lbs lost to date: 32  ASK: We discussed the diagnosis of obesity with Sherol Dade today and Rosann Auerbach agreed to give [place] permission to discuss obesity behavioral modification therapy today.  ASSESS: Brodi has the diagnosis of obesity and her BMI today is 42.7. Ranell is in the action stage of change.   ADVISE: Ahmaya was educated on the multiple health risks of obesity as well as the benefit of weight loss to improve her health. She was advised of the need for long term treatment and the importance of lifestyle modifications to improve her current health and to decrease her risk of future health problems.  AGREE: Multiple dietary modification options and treatment options were discussed and Calaya agreed to follow the recommendations documented in the above note.  ARRANGE: Doral was educated on the importance of frequent visits to treat obesity as outlined per CMS and USPSTF guidelines and agreed to schedule her next follow up appointment today.  Lenward Chancellor, am acting as transcriptionist for Abby Potash, PA-C I, Abby Potash, PA-C have reviewed above note and agree with its content

## 2018-07-29 ENCOUNTER — Other Ambulatory Visit: Payer: Self-pay | Admitting: Family Medicine

## 2018-07-29 DIAGNOSIS — F419 Anxiety disorder, unspecified: Secondary | ICD-10-CM

## 2018-08-02 ENCOUNTER — Other Ambulatory Visit (INDEPENDENT_AMBULATORY_CARE_PROVIDER_SITE_OTHER): Payer: Self-pay | Admitting: Physician Assistant

## 2018-08-02 DIAGNOSIS — E038 Other specified hypothyroidism: Secondary | ICD-10-CM

## 2018-08-02 DIAGNOSIS — R7303 Prediabetes: Secondary | ICD-10-CM

## 2018-08-02 DIAGNOSIS — E559 Vitamin D deficiency, unspecified: Secondary | ICD-10-CM

## 2018-08-03 MED ORDER — LEVOTHYROXINE SODIUM 112 MCG PO TABS: 112.0000 ug | ORAL_TABLET | Freq: Every day | ORAL | 0 refills | Status: DC

## 2018-08-03 MED ORDER — METFORMIN HCL 500 MG PO TABS: 500.0000 mg | ORAL_TABLET | Freq: Every day | ORAL | 0 refills | Status: DC

## 2018-08-12 ENCOUNTER — Ambulatory Visit (INDEPENDENT_AMBULATORY_CARE_PROVIDER_SITE_OTHER): Payer: 59 | Admitting: Physician Assistant

## 2018-08-12 ENCOUNTER — Encounter (INDEPENDENT_AMBULATORY_CARE_PROVIDER_SITE_OTHER): Payer: Self-pay | Admitting: Physician Assistant

## 2018-08-12 VITALS — BP 112/77 | HR 64 | Temp 98.4°F | Ht 60.0 in | Wt 218.0 lb

## 2018-08-12 DIAGNOSIS — Z6841 Body Mass Index (BMI) 40.0 and over, adult: Secondary | ICD-10-CM | POA: Diagnosis not present

## 2018-08-12 DIAGNOSIS — E559 Vitamin D deficiency, unspecified: Secondary | ICD-10-CM

## 2018-08-12 NOTE — Progress Notes (Signed)
Office: [phone]  /  Fax: [phone]   HPI:   Chief Complaint: OBESITY [name] [name] is here to discuss her progress with her obesity treatment plan. She is on the Category 2 plan and is following her eating plan approximately 35 % of the time. She states that she is exercising with resistance bands for 15 minutes 2 times per week. [name] [name] did well with weight loss. She reports that she is overeating at dinner. Her weight is 218 lb (98.9 kg) today and has had a weight loss of 1 pounds over a period of 2 weeks since her last visit. She has lost 35 lbs since starting treatment with [place].  Vitamin D deficiency [name] [name] has a diagnosis of vitamin D deficiency. She is currently taking prescription Vit D and denies nausea, vomiting or muscle weakness.   ASSESSMENT AND PLAN:  Vitamin D deficiency  Class 3 severe obesity with serious comorbidity and body mass index (BMI) of 40.0 to 44.9 in adult, unspecified obesity type ([place])  PLAN:  Vitamin D Deficiency [name] [name] was informed that low vitamin D levels contributes to fatigue and are associated with obesity, breast, and colon cancer. She agrees to continue to take prescription Vit D @50 ,000 IU every week and will follow up for routine testing of vitamin D, at least 2-3 times per year. She was informed of the risk of over-replacement of vitamin D and agrees to not increase her dose unless she discusses this with [place] first. [name] [name] agrees to follow up with our clinic in 2 weeks.  I spent > than 50% of the 15 minute visit on counseling as documented in the note.  Obesity [name] [name] is currently in the action stage of change. As such, her goal is to continue with weight loss efforts She has agreed to follow the Category 2 plan [name] [name] has been instructed to work up to a goal of 150 minutes of combined cardio and strengthening exercise per week for weight loss and overall health benefits. We discussed the following Behavioral Modification Strategies today:  decrease eating out and work on meal planning and easy cooking plans  [name] [name] has agrees to change to taking metformin at dinner follow up with our clinic in 2 weeks. She was informed of the importance of frequent follow up visits to maximize her success with intensive lifestyle modifications for her multiple health conditions.  ALLERGIES: No Known Allergies  MEDICATIONS: Current Outpatient Medications on File Prior to Visit  Medication Sig Dispense Refill  . diclofenac sodium (VOLTAREN) 1 % GEL APPLY 4 GRAMS TOPICALLY TO  SKIN 4 TIMES DAILY. 400 g 1  . levothyroxine (SYNTHROID, LEVOTHROID) 112 MCG tablet Take 1 tablet (112 mcg total) by mouth daily. 30 tablet 0  . loperamide (IMODIUM) 2 MG capsule TAKE 1 CAPSULE BY MOUTH TWO TIMES DAILY 180 capsule 1  . metFORMIN (GLUCOPHAGE) 500 MG tablet Take 1 tablet (500 mg total) by mouth daily with breakfast. 30 tablet 0  . multivitamin (THERAGRAN) per tablet Take 1 tablet by mouth daily.      . sertraline (ZOLOFT) 50 MG tablet TAKE 1 TABLET BY MOUTH  DAILY 90 tablet 1  . simvastatin (ZOCOR) 20 MG tablet Take 1 tablet (20 mg total) by mouth at bedtime. 90 tablet 1  . Ubiquinol 100 MG CAPS Take by mouth.    . Vitamin D, Ergocalciferol, (DRISDOL) 1.25 MG (50000 UT) CAPS capsule Take 1 capsule (50,000 Units total) by mouth every 7 (seven) days. 4 capsule 0   Current Facility-Administered Medications  on File Prior to Visit  Medication Dose Route Frequency Provider Last Rate Last Dose  . diclofenac sodium (VOLTAREN) 1 % transdermal gel 4 g  4 g Topical QID [name], DO        PAST MEDICAL HISTORY: Past Medical History:  Diagnosis Date  . Allergic rhinitis   . Arthritis   . Cataract   . Depression   . Emphysema lung (HCC)    Early stage  . Endometrial polyp   . Hyperlipidemia   . Hyperlipidemia    on simvastatin  . Hypothyroidism   . IBS (irritable bowel syndrome)   . Obesity   . Osteoarthritis   . Seasonal allergies     PAST  SURGICAL HISTORY: Past Surgical History:  Procedure Laterality Date  . BUNIONECTOMY  2009   Dr.Duda, left  . EYE SURGERY    . HYSTEROSCOPY W/D&C  04/24/2011   Procedure: DILATATION AND CURETTAGE (D&C) /HYSTEROSCOPY;  Surgeon: Eldred Manges, MD;  Location: [facility];  Service: Gynecology;  Laterality: N/A;  . 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 PLANTAR FASCIA SURGERY  1994   Dr Percell Miller  . POLYPECTOMY  04/24/2011   Procedure: POLYPECTOMY;  Surgeon: Eldred Manges, MD;  Location: [facility];  Service: Gynecology;  Laterality: N/A;    SOCIAL HISTORY: Social History   Tobacco Use  . Smoking status: Former Smoker    Packs/day: 2.00    Years: 42.00    Pack years: 84.00    Types: Cigarettes    Last attempt to quit: 07/28/2012    Years since quitting: 6.0  . Smokeless tobacco: Never Used  Substance Use Topics  . Alcohol use: Yes    Comment: rare  . Drug use: No    FAMILY HISTORY: Family History  Problem Relation Age of Onset  . Colon cancer Father 39  . Cancer Father 81       colon  . Alcoholism Father   . Pancreatic cancer Mother 43  . Cancer Mother 63       pancreatic  . Colon cancer Paternal Grandmother   . Cancer Paternal Grandmother 66       colon  . Heart attack Maternal Grandmother 50  . Heart disease Maternal Grandmother   . Cancer Sister 27       melanoma  . Breast cancer Sister   . Cancer Brother 53       skin  . Thyroid disease Sister   . Breast cancer Maternal Aunt 30  . Heart attack Maternal Aunt 60  . Breast cancer Sister     ROS: Review of Systems  Constitutional: Positive for weight loss.  Gastrointestinal: Negative for nausea and vomiting.  Musculoskeletal:       Negative for muscle weakness    PHYSICAL EXAM: Blood pressure 112/77, pulse 64, temperature 98.4 F (36.9 C), temperature source Oral, height 5' (1.524 m), weight 218 lb (98.9 kg), SpO2 99 %. Body mass index is 42.58 kg/m. Physical Exam Vitals signs reviewed.    Constitutional:      Appearance: Normal appearance. She is obese.  Cardiovascular:     Rate and Rhythm: Normal rate.     Pulses: Normal pulses.  Pulmonary:     Effort: Pulmonary effort is normal.  Musculoskeletal: Normal range of motion.  Skin:    General: Skin is warm and dry.  Neurological:     Mental Status: She is alert and oriented to person, place, and  time.  Psychiatric:        Mood and Affect: Mood normal.        Behavior: Behavior normal.     RECENT LABS AND TESTS: BMET    Component Value Date/Time   NA 141 05/26/2018 1218   K 4.9 05/26/2018 1218   CL 102 05/26/2018 1218   CO2 26 05/26/2018 1218   GLUCOSE 88 05/26/2018 1218   GLUCOSE 97 11/25/2017 1325   GLUCOSE 93 07/16/2006 1501   BUN 25 05/26/2018 1218   CREATININE 0.71 05/26/2018 1218   CALCIUM 9.5 05/26/2018 1218   GFRNONAA 87 05/26/2018 1218   GFRAA 100 05/26/2018 1218   Lab Results  Component Value Date   HGBA1C 5.4 05/26/2018   HGBA1C 5.7 (H) 02/17/2018   HGBA1C 5.8 02/14/2017   HGBA1C 5.5 01/23/2015   HGBA1C 5.7 02/24/2014   Lab Results  Component Value Date   INSULIN 8.4 05/26/2018   INSULIN 4.2 02/17/2018   CBC    Component Value Date/Time   WBC 6.0 05/26/2018 1218   WBC 3.7 (L) 11/25/2017 1325   RBC 4.95 05/26/2018 1218   RBC 4.58 11/25/2017 1325   HGB 14.9 05/26/2018 1218   HCT 45.3 05/26/2018 1218   PLT 164.0 11/25/2017 1325   MCV 92 05/26/2018 1218   MCH 30.1 05/26/2018 1218   MCH 31.1 04/17/2011 1358   MCHC 32.9 05/26/2018 1218   MCHC 33.4 11/25/2017 1325   RDW 13.9 05/26/2018 1218   LYMPHSABS 1.3 05/26/2018 1218   MONOABS 0.4 11/25/2017 1325   EOSABS 0.1 05/26/2018 1218   BASOSABS 0.0 05/26/2018 1218   Iron/TIBC/Ferritin/ %Sat No results found for: IRON, TIBC, FERRITIN, IRONPCTSAT Lipid Panel     Component Value Date/Time   CHOL 167 05/26/2018 1218   TRIG 67 05/26/2018 1218   HDL 74 05/26/2018 1218   CHOLHDL 2 11/25/2017 1325   VLDL 16.6 11/25/2017 1325    LDLCALC 80 05/26/2018 1218   LDLDIRECT 129.1 06/13/2011 1020   Hepatic Function Panel     Component Value Date/Time   PROT 6.6 05/26/2018 1218   ALBUMIN 4.5 05/26/2018 1218   AST 17 05/26/2018 1218   ALT 16 05/26/2018 1218   ALKPHOS 102 05/26/2018 1218   BILITOT 0.4 05/26/2018 1218   BILIDIR 0.1 12/23/2014 1031      Component Value Date/Time   TSH 3.030 05/26/2018 1218   TSH 6.930 (H) 02/17/2018 1113   TSH 3.48 11/25/2017 1325     Ref. Range 05/26/2018 12:18  Vitamin D, 25-Hydroxy Latest Ref Range: 30.0 - 100.0 ng/mL 43.6     OBESITY BEHAVIORAL INTERVENTION VISIT  Today's visit was # 13   Starting weight: 253 lbs Starting date: 02/17/2018 Today's weight :: 218 lbs Today's date: 08/12/2018 Total lbs lost to date: 24   ASK: We discussed the diagnosis of obesity with [name] [name] today and [name] [name] agreed to give [place] permission to discuss obesity behavioral modification therapy today.  ASSESS: [name] [name] has the diagnosis of obesity and her BMI today is 42.58 [name] [name] is in the action stage of change   ADVISE: Josceline was educated on the multiple health risks of obesity as well as the benefit of weight loss to improve her health. She was advised of the need for long term treatment and the importance of lifestyle modifications to improve her current health and to decrease her risk of future health problems.  AGREE: Multiple dietary modification options and treatment options were discussed and  [name] [name] agreed to follow  the recommendations documented in the above note.  ARRANGE: [name] [name] was educated on the importance of frequent visits to treat obesity as outlined per CMS and USPSTF guidelines and agreed to schedule her next follow up appointment today.  I, Tammy Wysor, am acting as Location manager for [employer] I, Abby Potash, PA-C have reviewed above note and agree with its content

## 2018-08-24 ENCOUNTER — Other Ambulatory Visit: Payer: Self-pay | Admitting: Family Medicine

## 2018-08-24 ENCOUNTER — Encounter: Payer: Self-pay | Admitting: Family Medicine

## 2018-08-24 DIAGNOSIS — F419 Anxiety disorder, unspecified: Secondary | ICD-10-CM

## 2018-08-24 MED ORDER — LOPERAMIDE HCL 2 MG PO CAPS: ORAL_CAPSULE | ORAL | 1 refills | Status: DC

## 2018-08-24 NOTE — Telephone Encounter (Signed)
Copied from [place] [id number]. Topic: Quick Communication - Rx Refill/Question >> Aug 24, 2018  3:45 PM Wynetta Emery, [place] C wrote: Medication: loperamide (IMODIUM) 2 MG capsule- 90 day supply.   Has the patient contacted their pharmacy? Yes  (Agent: If no, request that the patient contact the pharmacy for the refill.) (Agent: If yes, when and what did the pharmacy advise?)  Preferred Pharmacy (with phone number or street name): [place], [place] [phone] (Phone) [phone] (Fax)    Agent: Please be advised that RX refills may take up to 3 business days. We ask that you follow-up with your pharmacy.

## 2018-08-26 ENCOUNTER — Ambulatory Visit (INDEPENDENT_AMBULATORY_CARE_PROVIDER_SITE_OTHER): Payer: 59 | Admitting: Physician Assistant

## 2018-08-26 ENCOUNTER — Encounter (INDEPENDENT_AMBULATORY_CARE_PROVIDER_SITE_OTHER): Payer: Self-pay | Admitting: Physician Assistant

## 2018-08-26 VITALS — BP 115/75 | HR 63 | Temp 98.0°F | Ht 60.0 in | Wt 218.0 lb

## 2018-08-26 DIAGNOSIS — Z6841 Body Mass Index (BMI) 40.0 and over, adult: Secondary | ICD-10-CM

## 2018-08-26 DIAGNOSIS — R7303 Prediabetes: Secondary | ICD-10-CM | POA: Diagnosis not present

## 2018-08-26 DIAGNOSIS — E038 Other specified hypothyroidism: Secondary | ICD-10-CM | POA: Diagnosis not present

## 2018-08-26 DIAGNOSIS — Z9189 Other specified personal risk factors, not elsewhere classified: Secondary | ICD-10-CM

## 2018-08-26 DIAGNOSIS — E559 Vitamin D deficiency, unspecified: Secondary | ICD-10-CM

## 2018-08-26 MED ORDER — LEVOTHYROXINE SODIUM 112 MCG PO TABS: 112.0000 ug | ORAL_TABLET | Freq: Every day | ORAL | 0 refills | Status: DC

## 2018-08-26 MED ORDER — VITAMIN D (ERGOCALCIFEROL) 1.25 MG (50000 UNIT) PO CAPS: 50000.0000 [IU] | ORAL_CAPSULE | ORAL | 0 refills | Status: DC

## 2018-08-26 MED ORDER — METFORMIN HCL 500 MG PO TABS: 500.0000 mg | ORAL_TABLET | Freq: Every day | ORAL | 0 refills | Status: DC

## 2018-08-26 NOTE — Progress Notes (Signed)
Office: [phone]  /  Fax: [phone]   HPI:   Chief Complaint: OBESITY [name] [name] is here to discuss her progress with her obesity treatment plan. She is on the  follow the Category 2 plan and is following her eating plan approximately 65% of the time. She states she is exercising 0 minutes 0 times per week. [name] [name] reports that she has been undereating her protein and overeating her snack calories by using too much salad dressing. Her weight is 218 lb (98.9 kg) today and has not lost weight since her last visit. She has lost 35 lbs since starting treatment with [place].  Hypothyroid [name] [name] has a diagnosis of hypothyroidism. She is on levothyroxine. She denies hot or cold intolerance.  Pre-Diabetes [name] [name] has a diagnosis of prediabetes based on her elevated Hgb A1c and was informed this puts her at greater risk of developing diabetes. She is taking metformin currently and continues to work on diet and exercise to decrease risk of diabetes. [name] [name] reports that taking metformin at dinner has decreased her nighttime hunger. She denies polyphagia.  Vitamin D deficiency [name] [name] has a diagnosis of Vitamin D deficiency. She is currently taking prescription Vit D and denies nausea, vomiting or muscle weakness.  At risk for diabetes [name] [name] is at higher than averagerisk for developing diabetes due to her obesity. She currently denies polyuria or polydipsia.  ASSESSMENT AND PLAN:  Other specified hypothyroidism - Plan: levothyroxine (SYNTHROID, LEVOTHROID) 112 MCG tablet  Prediabetes - Plan: metFORMIN (GLUCOPHAGE) 500 MG tablet  Vitamin D deficiency - Plan: Vitamin D, Ergocalciferol, (DRISDOL) 1.25 MG (50000 UT) CAPS capsule  At risk for diabetes mellitus  Class 3 severe obesity with serious comorbidity and body mass index (BMI) of 40.0 to 44.9 in adult, unspecified obesity type [facility])  PLAN:  Hypothyroid [name] [name] was informed of the importance of good thyroid control to help with weight loss  efforts. She was also informed that supertheraputic thyroid levels are dangerous and will not improve weight loss results. [name] [name] was given a refill on her levothyroxine #30 with no refills. She agrees to follow-up with our clinic in 2 weeks.   Pre-Diabetes [name] [name] will continue to work on weight loss, exercise, and decreasing simple carbohydrates in her diet to help decrease the risk of diabetes. We dicussed metformin including benefits and risks. She was informed that eating too many simple carbohydrates or too many calories at one sitting increases the likelihood of GI side effects. [name] [name] is on metformin for now and a prescription was written today for #30 1 PO qpm with no refills. [name] [name] agrees to follow-up with our clinic in 2 weeks.  Vitamin D Deficiency [name] [name] was informed that low Vitamin D levels contributes to fatigue and are associated with obesity, breast, and colon cancer. She agrees to continue to take prescription Vit D @ 50,000 IU every week #4 with no refills and will follow-up for routine testing of Vitamin D, at least 2-3 times per year. She was informed of the risk of over-replacement of Vitamin D and agrees to not increase her dose unless she discusses this with [place] first. [name] [name] agrees to follow-up with our clinic in 2 weeks.  Diabetes risk counseling [name] [name] was given extended (15 minutes) diabetes prevention counseling today. She is 73 y.o. female and has risk factors for diabetes including obesity. We discussed intensive lifestyle modifications today with an emphasis on weight loss as well as increasing exercise and decreasing simple carbohydrates in her diet.  Obesity [name] [name] is currently in the  action stage of change. As such, her goal is to continue with weight loss efforts. She has agreed to follow the Category 2 plan.  [name] [name] has been instructed to work up to a goal of 150 minutes of combined cardio and strengthening exercise per week for weight loss and overall health  benefits. We discussed the following Behavioral Modification Strategies today: work on meal planning and easy cooking plans and planning for success.  [name] [name] has agreed to follow up with our clinic in 2 weeks. She was informed of the importance of frequent follow up visits to maximize her success with intensive lifestyle modifications for her multiple health conditions.  ALLERGIES: No Known Allergies  MEDICATIONS: Current Outpatient Medications on File Prior to Visit  Medication Sig Dispense Refill  . diclofenac sodium (VOLTAREN) 1 % GEL APPLY 4 GRAMS TOPICALLY TO  SKIN 4 TIMES DAILY. 400 g 1  . loperamide (IMODIUM) 2 MG capsule TAKE 1 CAPSULE BY MOUTH TWO TIMES DAILY 180 capsule 1  . multivitamin (THERAGRAN) per tablet Take 1 tablet by mouth daily.      . sertraline (ZOLOFT) 50 MG tablet TAKE 1 TABLET BY MOUTH  DAILY 90 tablet 1  . simvastatin (ZOCOR) 20 MG tablet Take 1 tablet (20 mg total) by mouth at bedtime. 90 tablet 1  . Ubiquinol 100 MG CAPS Take by mouth.     Current Facility-Administered Medications on File Prior to Visit  Medication Dose Route Frequency Provider Last Rate Last Dose  . diclofenac sodium (VOLTAREN) 1 % transdermal gel 4 g  4 g Topical QID [name], DO        PAST MEDICAL HISTORY: Past Medical History:  Diagnosis Date  . Allergic rhinitis   . Arthritis   . Cataract   . Depression   . Emphysema lung (HCC)    Early stage  . Endometrial polyp   . Hyperlipidemia   . Hyperlipidemia    on simvastatin  . Hypothyroidism   . IBS (irritable bowel syndrome)   . Obesity   . Osteoarthritis   . Seasonal allergies     PAST SURGICAL HISTORY: Past Surgical History:  Procedure Laterality Date  . BUNIONECTOMY  2009   Dr.Duda, left  . EYE SURGERY    . HYSTEROSCOPY W/D&C  04/24/2011   Procedure: DILATATION AND CURETTAGE (D&C) /HYSTEROSCOPY;  Surgeon: Eldred Manges, MD;  Location: [facility];  Service: Gynecology;  Laterality: N/A;  . 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 PLANTAR FASCIA SURGERY  1994   Dr Percell Miller  . POLYPECTOMY  04/24/2011   Procedure: POLYPECTOMY;  Surgeon: Eldred Manges, MD;  Location: [facility];  Service: Gynecology;  Laterality: N/A;    SOCIAL HISTORY: Social History   Tobacco Use  . Smoking status: Former Smoker    Packs/day: 2.00    Years: 42.00    Pack years: 84.00    Types: Cigarettes    Last attempt to quit: 07/28/2012    Years since quitting: 6.0  . Smokeless tobacco: Never Used  Substance Use Topics  . Alcohol use: Yes    Comment: rare  . Drug use: No    FAMILY HISTORY: Family History  Problem Relation Age of Onset  . Colon cancer Father 77  . Cancer Father 6       colon  . Alcoholism Father   . Pancreatic cancer Mother 11  . Cancer Mother 73       pancreatic  .  Colon cancer Paternal Grandmother   . Cancer Paternal Grandmother 45       colon  . Heart attack Maternal Grandmother 50  . Heart disease Maternal Grandmother   . Cancer Sister 45       melanoma  . Breast cancer Sister   . Cancer Brother 63       skin  . Thyroid disease Sister   . Breast cancer Maternal Aunt 30  . Heart attack Maternal Aunt 60  . Breast cancer Sister    ROS: Review of Systems  Constitutional: Negative for weight loss.       Negative for hot or cold intolerance.  Gastrointestinal: Negative for nausea and vomiting.  Musculoskeletal:       Negative for muscle weakness.  Endo/Heme/Allergies:       Negative for polyphagia. Negative for hypoglycemia.   PHYSICAL EXAM: Blood pressure 115/75, pulse 63, temperature 98 F (36.7 C), temperature source Oral, height 5' (1.524 m), weight 218 lb (98.9 kg), SpO2 98 %. Body mass index is 42.58 kg/m. Physical Exam Vitals signs reviewed.  Constitutional:      Appearance: Normal appearance. She is obese.  Cardiovascular:     Rate and Rhythm: Normal rate.     Pulses: Normal pulses.  Pulmonary:     Effort: Pulmonary effort is normal.      Breath sounds: Normal breath sounds.  Musculoskeletal: Normal range of motion.  Skin:    General: Skin is warm and dry.  Neurological:     Mental Status: She is alert and oriented to person, place, and time.  Psychiatric:        Behavior: Behavior normal.   RECENT LABS AND TESTS: BMET    Component Value Date/Time   NA 141 05/26/2018 1218   K 4.9 05/26/2018 1218   CL 102 05/26/2018 1218   CO2 26 05/26/2018 1218   GLUCOSE 88 05/26/2018 1218   GLUCOSE 97 11/25/2017 1325   GLUCOSE 93 07/16/2006 1501   BUN 25 05/26/2018 1218   CREATININE 0.71 05/26/2018 1218   CALCIUM 9.5 05/26/2018 1218   GFRNONAA 87 05/26/2018 1218   GFRAA 100 05/26/2018 1218   Lab Results  Component Value Date   HGBA1C 5.4 05/26/2018   HGBA1C 5.7 (H) 02/17/2018   HGBA1C 5.8 02/14/2017   HGBA1C 5.5 01/23/2015   HGBA1C 5.7 02/24/2014   Lab Results  Component Value Date   INSULIN 8.4 05/26/2018   INSULIN 4.2 02/17/2018   CBC    Component Value Date/Time   WBC 6.0 05/26/2018 1218   WBC 3.7 (L) 11/25/2017 1325   RBC 4.95 05/26/2018 1218   RBC 4.58 11/25/2017 1325   HGB 14.9 05/26/2018 1218   HCT 45.3 05/26/2018 1218   PLT 164.0 11/25/2017 1325   MCV 92 05/26/2018 1218   MCH 30.1 05/26/2018 1218   MCH 31.1 04/17/2011 1358   MCHC 32.9 05/26/2018 1218   MCHC 33.4 11/25/2017 1325   RDW 13.9 05/26/2018 1218   LYMPHSABS 1.3 05/26/2018 1218   MONOABS 0.4 11/25/2017 1325   EOSABS 0.1 05/26/2018 1218   BASOSABS 0.0 05/26/2018 1218   Iron/TIBC/Ferritin/ %Sat No results found for: IRON, TIBC, FERRITIN, IRONPCTSAT Lipid Panel     Component Value Date/Time   CHOL 167 05/26/2018 1218   TRIG 67 05/26/2018 1218   HDL 74 05/26/2018 1218   CHOLHDL 2 11/25/2017 1325   VLDL 16.6 11/25/2017 1325   LDLCALC 80 05/26/2018 1218   LDLDIRECT 129.1 06/13/2011 1020   Hepatic Function  Panel     Component Value Date/Time   PROT 6.6 05/26/2018 1218   ALBUMIN 4.5 05/26/2018 1218   AST 17 05/26/2018 1218   ALT  16 05/26/2018 1218   ALKPHOS 102 05/26/2018 1218   BILITOT 0.4 05/26/2018 1218   BILIDIR 0.1 12/23/2014 1031      Component Value Date/Time   TSH 3.030 05/26/2018 1218   TSH 6.930 (H) 02/17/2018 1113   TSH 3.48 11/25/2017 1325   Results for JAMILIA, JACQUES (MRN [id number]) as of 08/26/2018 14:53  Ref. Range 05/26/2018 12:18  Vitamin D, 25-Hydroxy Latest Ref Range: 30.0 - 100.0 ng/mL 43.6   OBESITY BEHAVIORAL INTERVENTION VISIT  Today's visit was #14  Starting weight: 253 lbs Starting date: 02/17/2018 Today's weight: 218 lbs Today's date: 08/26/2018 Total lbs lost to date: 40  ASK: We discussed the diagnosis of obesity with Sherol Dade today and Rosann Auerbach agreed to give [place] permission to discuss obesity behavioral modification therapy today.  ASSESS: Ragna has the diagnosis of obesity and her BMI today is 42.58. Adaora is in the action stage of change.  ADVISE: Angala was educated on the multiple health risks of obesity as well as the benefit of weight loss to improve her health. She was advised of the need for long term treatment and the importance of lifestyle modifications to improve her current health and to decrease her risk of future health problems.  AGREE: Multiple dietary modification options and treatment options were discussed and  Myrtice agreed to follow the recommendations documented in the above note.  ARRANGE: Karle was educated on the importance of frequent visits to treat obesity as outlined per CMS and USPSTF guidelines and agreed to schedule her next follow up appointment today.  Migdalia Dk, am acting as transcriptionist for Abby Potash, PA-C I, Abby Potash, PA-C have reviewed above note and agree with its content

## 2018-09-09 ENCOUNTER — Encounter (INDEPENDENT_AMBULATORY_CARE_PROVIDER_SITE_OTHER): Payer: Self-pay | Admitting: Physician Assistant

## 2018-09-09 ENCOUNTER — Ambulatory Visit (INDEPENDENT_AMBULATORY_CARE_PROVIDER_SITE_OTHER): Payer: 59 | Admitting: Physician Assistant

## 2018-09-09 VITALS — BP 108/72 | HR 63 | Temp 97.5°F | Ht 60.0 in | Wt 212.0 lb

## 2018-09-09 DIAGNOSIS — Z9189 Other specified personal risk factors, not elsewhere classified: Secondary | ICD-10-CM | POA: Diagnosis not present

## 2018-09-09 DIAGNOSIS — Z6841 Body Mass Index (BMI) 40.0 and over, adult: Secondary | ICD-10-CM | POA: Diagnosis not present

## 2018-09-09 DIAGNOSIS — R7303 Prediabetes: Secondary | ICD-10-CM | POA: Diagnosis not present

## 2018-09-09 DIAGNOSIS — E038 Other specified hypothyroidism: Secondary | ICD-10-CM

## 2018-09-09 DIAGNOSIS — E559 Vitamin D deficiency, unspecified: Secondary | ICD-10-CM | POA: Diagnosis not present

## 2018-09-09 MED ORDER — LEVOTHYROXINE SODIUM 112 MCG PO TABS: 112.0000 ug | ORAL_TABLET | Freq: Every day | ORAL | 0 refills | Status: DC

## 2018-09-09 MED ORDER — VITAMIN D (ERGOCALCIFEROL) 1.25 MG (50000 UNIT) PO CAPS: 50000.0000 [IU] | ORAL_CAPSULE | ORAL | 0 refills | Status: DC

## 2018-09-09 MED ORDER — METFORMIN HCL 500 MG PO TABS: 500.0000 mg | ORAL_TABLET | Freq: Every day | ORAL | 0 refills | Status: DC

## 2018-09-09 NOTE — Progress Notes (Signed)
Office: [phone]  /  Fax: [phone]   HPI:   Chief Complaint: OBESITY Denene is here to discuss her progress with her obesity treatment plan. She is on the Category 2 plan and is following her eating plan approximately 75% of the time. She states she is exercising 0 minutes 0 times per week. Anushree did very well with weight loss. She reports that she followed the plan very closely. She will be having a knee replacement in a few months. Her weight is 212 lb (96.2 kg) today and has had a weight loss of 6 pounds over a period of 2 weeks since her last visit. She has lost 41 lbs since starting treatment with [place].  Vitamin D deficiency Susanna has a diagnosis of Vitamin D deficiency. She is currently taking prescription Vit D and denies nausea, vomiting or muscle weakness.  At risk for osteopenia and osteoporosis Socorro is at higher risk of osteopenia and osteoporosis due to Vitamin D deficiency.   Pre-Diabetes Nilaya has a diagnosis of prediabetes based on her elevated HgA1c and was informed this puts her at greater risk of developing diabetes. She is taking metformin currently and continues to work on diet and exercise to decrease risk of diabetes. She denies nausea, vomiting, diarrhea, or polyphagia.   Hypothyroidism Littie has a diagnosis of hypothyroidism. She is on levothyroxine. She denies hot or cold intolerance.  ASSESSMENT AND PLAN:  Vitamin D deficiency - Plan: Vitamin D, Ergocalciferol, (DRISDOL) 1.25 MG (50000 UT) CAPS capsule  Prediabetes - Plan: metFORMIN (GLUCOPHAGE) 500 MG tablet  Other specified hypothyroidism - Plan: levothyroxine (SYNTHROID, LEVOTHROID) 112 MCG tablet  At risk for osteoporosis  Class 3 severe obesity with serious comorbidity and body mass index (BMI) of 40.0 to 44.9 in adult, unspecified obesity type ([place])  PLAN:  Vitamin D Deficiency Kia was informed that low Vitamin D levels contributes to fatigue and are associated with obesity,  breast, and colon cancer. She agrees to continue to take prescription Vit D @ 50,000 IU every week #4 with no refills and will follow-up for routine testing of Vitamin D, at least 2-3 times per year. She was informed of the risk of over-replacement of Vitamin D and agrees to not increase her dose unless she discusses this with [place] first. Ulla agrees to follow-up with our clinic in 3 weeks.  At risk for osteopenia and osteoporosis Everlee was given extended  (15 minutes) osteoporosis prevention counseling today. Nykira is at risk for osteopenia and osteoporsis due to her Vitamin D deficiency. She was encouraged to take her Vitamin D and follow her higher calcium diet and increase strengthening exercise to help strengthen her bones and decrease her risk of osteopenia and osteoporosis.  Pre-Diabetes Shailene will continue to work on weight loss, exercise, and decreasing simple carbohydrates in her diet to help decrease the risk of diabetes. We dicussed metformin including benefits and risks. She was informed that eating too many simple carbohydrates or too many calories at one sitting increases the likelihood of GI side effects. Marnette is on metformin for now and a prescription was written today for #30 with no refills. Lorianne agreed to follow-up with [place] as directed to monitor her progress.  Hypothyroidism Georganna was informed of the importance of good thyroid control to help with weight loss efforts. She was also informed that supertheraputic thyroid levels are dangerous and will not improve weight loss results. Kyrin was given a refill on her levothyroxine #30 with no refills and agrees to follow-up  with our clinic in 3 weeks.  Obesity Emili is currently in the action stage of change. As such, her goal is to continue with weight loss efforts. She has agreed to follow the Category 2 plan. Keylah has been instructed to work up to a goal of 150 minutes of combined cardio and strengthening exercise per  week for weight loss and overall health benefits. We discussed the following Behavioral Modification Strategies today: work on meal planning and easy cooking plans and planning for success.  Jakeline has agreed to follow-up with our clinic in 3 weeks. She was informed of the importance of frequent follow up visits to maximize her success with intensive lifestyle modifications for her multiple health conditions.  ALLERGIES: No Known Allergies  MEDICATIONS: Current Outpatient Medications on File Prior to Visit  Medication Sig Dispense Refill  . diclofenac sodium (VOLTAREN) 1 % GEL APPLY 4 GRAMS TOPICALLY TO  SKIN 4 TIMES DAILY. 400 g 1  . loperamide (IMODIUM) 2 MG capsule TAKE 1 CAPSULE BY MOUTH TWO TIMES DAILY 180 capsule 1  . multivitamin (THERAGRAN) per tablet Take 1 tablet by mouth daily.      . sertraline (ZOLOFT) 50 MG tablet TAKE 1 TABLET BY MOUTH  DAILY 90 tablet 1  . simvastatin (ZOCOR) 20 MG tablet Take 1 tablet (20 mg total) by mouth at bedtime. 90 tablet 1  . Ubiquinol 100 MG CAPS Take by mouth.     Current Facility-Administered Medications on File Prior to Visit  Medication Dose Route Frequency Provider Last Rate Last Dose  . diclofenac sodium (VOLTAREN) 1 % transdermal gel 4 g  4 g Topical QID [name], DO        PAST MEDICAL HISTORY: Past Medical History:  Diagnosis Date  . Allergic rhinitis   . Arthritis   . Cataract   . Depression   . Emphysema lung (HCC)    Early stage  . Endometrial polyp   . Hyperlipidemia   . Hyperlipidemia    on simvastatin  . Hypothyroidism   . IBS (irritable bowel syndrome)   . Obesity   . Osteoarthritis   . Seasonal allergies     PAST SURGICAL HISTORY: Past Surgical History:  Procedure Laterality Date  . BUNIONECTOMY  2009   Dr.Duda, left  . EYE SURGERY    . HYSTEROSCOPY W/D&C  04/24/2011   Procedure: DILATATION AND CURETTAGE (D&C) /HYSTEROSCOPY;  Surgeon: Eldred Manges, MD;  Location: [facility];  Service:  Gynecology;  Laterality: N/A;  . 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 PLANTAR FASCIA SURGERY  1994   Dr Percell Miller  . POLYPECTOMY  04/24/2011   Procedure: POLYPECTOMY;  Surgeon: Eldred Manges, MD;  Location: [facility];  Service: Gynecology;  Laterality: N/A;    SOCIAL HISTORY: Social History   Tobacco Use  . Smoking status: Former Smoker    Packs/day: 2.00    Years: 42.00    Pack years: 84.00    Types: Cigarettes    Last attempt to quit: 07/28/2012    Years since quitting: 6.1  . Smokeless tobacco: Never Used  Substance Use Topics  . Alcohol use: Yes    Comment: rare  . Drug use: No    FAMILY HISTORY: Family History  Problem Relation Age of Onset  . Colon cancer Father 68  . Cancer Father 34       colon  . Alcoholism Father   . Pancreatic cancer Mother 59  . Cancer  Mother 59       pancreatic  . Colon cancer Paternal Grandmother   . Cancer Paternal Grandmother 3       colon  . Heart attack Maternal Grandmother 50  . Heart disease Maternal Grandmother   . Cancer Sister 66       melanoma  . Breast cancer Sister   . Cancer Brother 19       skin  . Thyroid disease Sister   . Breast cancer Maternal Aunt 30  . Heart attack Maternal Aunt 60  . Breast cancer Sister    ROS: Review of Systems  Constitutional: Positive for weight loss.  Gastrointestinal: Negative for diarrhea, nausea and vomiting.  Musculoskeletal:       Negative for muscle weakness.  Endo/Heme/Allergies:       Negative for polyphagia. Negative for hypoglycemia.   PHYSICAL EXAM: Blood pressure 108/72, pulse 63, temperature (!) 97.5 F (36.4 C), height 5' (1.524 m), weight 212 lb (96.2 kg), SpO2 98 %. Body mass index is 41.4 kg/m. Physical Exam Vitals signs reviewed.  Constitutional:      Appearance: Normal appearance. She is obese.  Cardiovascular:     Rate and Rhythm: Normal rate.     Pulses: Normal pulses.  Pulmonary:     Effort: Pulmonary effort is normal.     Breath  sounds: Normal breath sounds.  Musculoskeletal: Normal range of motion.  Skin:    General: Skin is warm and dry.  Neurological:     Mental Status: She is alert and oriented to person, place, and time.  Psychiatric:        Behavior: Behavior normal.   RECENT LABS AND TESTS: BMET    Component Value Date/Time   NA 141 05/26/2018 1218   K 4.9 05/26/2018 1218   CL 102 05/26/2018 1218   CO2 26 05/26/2018 1218   GLUCOSE 88 05/26/2018 1218   GLUCOSE 97 11/25/2017 1325   GLUCOSE 93 07/16/2006 1501   BUN 25 05/26/2018 1218   CREATININE 0.71 05/26/2018 1218   CALCIUM 9.5 05/26/2018 1218   GFRNONAA 87 05/26/2018 1218   GFRAA 100 05/26/2018 1218   Lab Results  Component Value Date   HGBA1C 5.4 05/26/2018   HGBA1C 5.7 (H) 02/17/2018   HGBA1C 5.8 02/14/2017   HGBA1C 5.5 01/23/2015   HGBA1C 5.7 02/24/2014   Lab Results  Component Value Date   INSULIN 8.4 05/26/2018   INSULIN 4.2 02/17/2018   CBC    Component Value Date/Time   WBC 6.0 05/26/2018 1218   WBC 3.7 (L) 11/25/2017 1325   RBC 4.95 05/26/2018 1218   RBC 4.58 11/25/2017 1325   HGB 14.9 05/26/2018 1218   HCT 45.3 05/26/2018 1218   PLT 164.0 11/25/2017 1325   MCV 92 05/26/2018 1218   MCH 30.1 05/26/2018 1218   MCH 31.1 04/17/2011 1358   MCHC 32.9 05/26/2018 1218   MCHC 33.4 11/25/2017 1325   RDW 13.9 05/26/2018 1218   LYMPHSABS 1.3 05/26/2018 1218   MONOABS 0.4 11/25/2017 1325   EOSABS 0.1 05/26/2018 1218   BASOSABS 0.0 05/26/2018 1218   Iron/TIBC/Ferritin/ %Sat No results found for: IRON, TIBC, FERRITIN, IRONPCTSAT Lipid Panel     Component Value Date/Time   CHOL 167 05/26/2018 1218   TRIG 67 05/26/2018 1218   HDL 74 05/26/2018 1218   CHOLHDL 2 11/25/2017 1325   VLDL 16.6 11/25/2017 1325   LDLCALC 80 05/26/2018 1218   LDLDIRECT 129.1 06/13/2011 1020   Hepatic Function Panel  Component Value Date/Time   PROT 6.6 05/26/2018 1218   ALBUMIN 4.5 05/26/2018 1218   AST 17 05/26/2018 1218   ALT 16  05/26/2018 1218   ALKPHOS 102 05/26/2018 1218   BILITOT 0.4 05/26/2018 1218   BILIDIR 0.1 12/23/2014 1031      Component Value Date/Time   TSH 3.030 05/26/2018 1218   TSH 6.930 (H) 02/17/2018 1113   TSH 3.48 11/25/2017 1325    Ref. Range 05/26/2018 12:18  Vitamin D, 25-Hydroxy Latest Ref Range: 30.0 - 100.0 ng/mL 43.6   OBESITY BEHAVIORAL INTERVENTION VISIT  Today's visit was #15  Starting weight: 253 lbs Starting date: 02/17/2018 Today's weight: 212 lbs  Today's date: 09/09/2018 Total lbs lost to date: 41    09/09/2018  Height 5' (1.524 m)  Weight 212 lb (96.2 kg)  BMI (Calculated) 41.4  BLOOD PRESSURE - SYSTOLIC 400  BLOOD PRESSURE - DIASTOLIC 72   Body Fat % 86.7 %  Total Body Water (lbs) 73.6 lbs   ASK: We discussed the diagnosis of obesity with Sherol Dade today and Anitha agreed to give [place] permission to discuss obesity behavioral modification therapy today.  ASSESS: Shaakira has the diagnosis of obesity and her BMI today is 41.4. Jazman is in the action stage of change.   ADVISE: Abril was educated on the multiple health risks of obesity as well as the benefit of weight loss to improve her health. She was advised of the need for long term treatment and the importance of lifestyle modifications to improve her current health and to decrease her risk of future health problems.  AGREE: Multiple dietary modification options and treatment options were discussed and  Ashle agreed to follow the recommendations documented in the above note.  ARRANGE: Kiyoko was educated on the importance of frequent visits to treat obesity as outlined per CMS and USPSTF guidelines and agreed to schedule her next follow up appointment today.  Migdalia Dk, am acting as transcriptionist for Abby Potash, PA-C I, Abby Potash, PA-C have reviewed above note and agree with its content

## 2018-09-28 ENCOUNTER — Encounter (INDEPENDENT_AMBULATORY_CARE_PROVIDER_SITE_OTHER): Payer: Self-pay | Admitting: Physician Assistant

## 2018-09-28 ENCOUNTER — Other Ambulatory Visit (INDEPENDENT_AMBULATORY_CARE_PROVIDER_SITE_OTHER): Payer: Self-pay | Admitting: Physician Assistant

## 2018-09-28 DIAGNOSIS — E038 Other specified hypothyroidism: Secondary | ICD-10-CM

## 2018-09-28 DIAGNOSIS — R7303 Prediabetes: Secondary | ICD-10-CM

## 2018-09-28 DIAGNOSIS — E559 Vitamin D deficiency, unspecified: Secondary | ICD-10-CM

## 2018-09-29 ENCOUNTER — Ambulatory Visit (INDEPENDENT_AMBULATORY_CARE_PROVIDER_SITE_OTHER): Payer: 59 | Admitting: Physician Assistant

## 2018-09-29 ENCOUNTER — Encounter (INDEPENDENT_AMBULATORY_CARE_PROVIDER_SITE_OTHER): Payer: Self-pay | Admitting: Physician Assistant

## 2018-09-30 ENCOUNTER — Other Ambulatory Visit (INDEPENDENT_AMBULATORY_CARE_PROVIDER_SITE_OTHER): Payer: Self-pay

## 2018-09-30 DIAGNOSIS — E038 Other specified hypothyroidism: Secondary | ICD-10-CM

## 2018-09-30 DIAGNOSIS — R7303 Prediabetes: Secondary | ICD-10-CM

## 2018-09-30 DIAGNOSIS — E559 Vitamin D deficiency, unspecified: Secondary | ICD-10-CM

## 2018-09-30 MED ORDER — VITAMIN D (ERGOCALCIFEROL) 1.25 MG (50000 UNIT) PO CAPS: 50000.0000 [IU] | ORAL_CAPSULE | ORAL | 0 refills | Status: DC

## 2018-09-30 MED ORDER — LEVOTHYROXINE SODIUM 112 MCG PO TABS: 112.0000 ug | ORAL_TABLET | Freq: Every day | ORAL | 0 refills | Status: DC

## 2018-09-30 MED ORDER — METFORMIN HCL 500 MG PO TABS: 500.0000 mg | ORAL_TABLET | Freq: Every day | ORAL | 0 refills | Status: DC

## 2018-10-08 ENCOUNTER — Encounter (INDEPENDENT_AMBULATORY_CARE_PROVIDER_SITE_OTHER): Payer: Self-pay

## 2018-10-13 ENCOUNTER — Ambulatory Visit (INDEPENDENT_AMBULATORY_CARE_PROVIDER_SITE_OTHER): Payer: 59 | Admitting: Physician Assistant

## 2018-11-11 ENCOUNTER — Other Ambulatory Visit: Payer: Self-pay | Admitting: Family Medicine

## 2018-11-11 DIAGNOSIS — E785 Hyperlipidemia, unspecified: Secondary | ICD-10-CM

## 2018-11-13 ENCOUNTER — Encounter: Payer: Self-pay | Admitting: *Deleted

## 2018-11-16 ENCOUNTER — Encounter (INDEPENDENT_AMBULATORY_CARE_PROVIDER_SITE_OTHER): Payer: Self-pay | Admitting: Physician Assistant

## 2018-11-16 ENCOUNTER — Ambulatory Visit (INDEPENDENT_AMBULATORY_CARE_PROVIDER_SITE_OTHER): Payer: 59 | Admitting: Physician Assistant

## 2018-11-16 ENCOUNTER — Other Ambulatory Visit: Payer: Self-pay

## 2018-11-16 DIAGNOSIS — Z6841 Body Mass Index (BMI) 40.0 and over, adult: Secondary | ICD-10-CM

## 2018-11-16 DIAGNOSIS — E559 Vitamin D deficiency, unspecified: Secondary | ICD-10-CM

## 2018-11-16 DIAGNOSIS — R7303 Prediabetes: Secondary | ICD-10-CM

## 2018-11-16 DIAGNOSIS — E038 Other specified hypothyroidism: Secondary | ICD-10-CM | POA: Diagnosis not present

## 2018-11-16 DIAGNOSIS — E66813 Obesity, class 3: Secondary | ICD-10-CM

## 2018-11-16 MED ORDER — VITAMIN D (ERGOCALCIFEROL) 1.25 MG (50000 UNIT) PO CAPS: 50000.0000 [IU] | ORAL_CAPSULE | ORAL | 0 refills | Status: DC

## 2018-11-16 MED ORDER — METFORMIN HCL 500 MG PO TABS: 500.0000 mg | ORAL_TABLET | Freq: Every day | ORAL | 1 refills | Status: DC

## 2018-11-16 MED ORDER — LEVOTHYROXINE SODIUM 112 MCG PO TABS: 112.0000 ug | ORAL_TABLET | Freq: Every day | ORAL | 1 refills | Status: DC

## 2018-11-17 NOTE — Progress Notes (Signed)
Office: [phone]  /  Fax: [phone] TeleHealth Visit:  [name] [name] has verbally consented to this TeleHealth visit today. The patient is located at home, the provider is located at the [facility] office. The participants in this visit include the listed provider and patient. The visit was conducted today via telephone call.  HPI:   Chief Complaint: OBESITY [name] [name] is here to discuss her progress with her obesity treatment plan. She is on the Category 2 plan and is following her eating plan approximately 0% of the time. She states she is exercising 0 minutes 0 times per week. [name] [name] reports that she is very stressed with the pandemic and is fearful that her husband is going to get sick. She states she is eating a lot of comfort foods.  We were unable to weigh the patient today for this TeleHealth visit. She states her weight is 221 lbs. She has lost 41 lbs since starting treatment with [place].  Vitamin D deficiency [name] [name] has a diagnosis of Vitamin D deficiency. She is currently taking prescription Vit D and denies nausea, vomiting or muscle weakness.  Hypothyroidism [name] [name] has a diagnosis of hypothyroidism. She is on levothyroxine. She reports hair loss recently but is very stressed with the pandemic. Her last level was at goal.  Pre-Diabetes [name] [name] has a diagnosis of prediabetes based on her elevated Hgb A1c and was informed this puts her at greater risk of developing diabetes. She is taking metformin currently and continues to work on diet and exercise to decrease risk of diabetes. She denies nausea, vomiting, diarrhea, or polyphagia.  ASSESSMENT AND PLAN:  Vitamin D deficiency - Plan: Vitamin D, Ergocalciferol, (DRISDOL) 1.25 MG (50000 UT) CAPS capsule  Other specified hypothyroidism - Plan: levothyroxine (SYNTHROID) 112 MCG tablet  Prediabetes - Plan: metFORMIN (GLUCOPHAGE) 500 MG tablet  Class 3 severe obesity with serious comorbidity and body mass index  (BMI) of 40.0 to 44.9 in adult, unspecified obesity type ([place])  PLAN:  Vitamin D Deficiency [name] [name] was informed that low Vitamin D levels contributes to fatigue and are associated with obesity, breast, and colon cancer. She agrees to continue to take prescription Vit D @ 50,000 IU every week #4 with 0 refills and will follow-up for routine testing of Vitamin D, at least 2-3 times per year. She was informed of the risk of over-replacement of Vitamin D and agrees to not increase her dose unless she discusses this with [place] first. Magalie agrees to follow-up with our clinic in 2 weeks.  Hypothyroidism [name] [name] was informed of the importance of good thyroid control to help with weight loss efforts. She was also informed that supertheraputic thyroid levels are dangerous and will not improve weight loss results. [name] [name] was given a refill on her Synthroid #30 with 1 refill and agrees to follow-up with our clinic in 2 weeks.  Pre-Diabetes [name] [name] will continue to work on weight loss, exercise, and decreasing simple carbohydrates in her diet to help decrease the risk of diabetes. We dicussed metformin including benefits and risks. She was informed that eating too many simple carbohydrates or too many calories at one sitting increases the likelihood of GI side effects. [name] [name] is currently taking metformin and a refill prescription was written today for #30 with 1 refill and agrees to follow-up with our clinic in 2 weeks.  Obesity [name] [name] is currently in the action stage of change. As such, her goal is to continue with weight loss efforts. She has agreed to follow the Category  2 plan. [name] [name] has been instructed to work up to a goal of 150 minutes of combined cardio and strengthening exercise per week for weight loss and overall health benefits. We discussed the following Behavioral Modification Strategies today: no skipping meals, work on meal planning and easy cooking plans.  [name] [name] has agreed to follow-up with  our clinic in 2 weeks. She was informed of the importance of frequent follow-up visits to maximize her success with intensive lifestyle modifications for her multiple health conditions.  ALLERGIES: No Known Allergies  MEDICATIONS: Current Outpatient Medications on File Prior to Visit  Medication Sig Dispense Refill  . diclofenac sodium (VOLTAREN) 1 % GEL APPLY 4 GRAMS TOPICALLY TO  SKIN 4 TIMES DAILY. 400 g 1  . loperamide (IMODIUM) 2 MG capsule TAKE 1 CAPSULE BY MOUTH TWO TIMES DAILY 180 capsule 1  . multivitamin (THERAGRAN) per tablet Take 1 tablet by mouth daily.      . sertraline (ZOLOFT) 50 MG tablet TAKE 1 TABLET BY MOUTH  DAILY 90 tablet 1  . simvastatin (ZOCOR) 20 MG tablet TAKE 1 TABLET BY MOUTH AT  BEDTIME 90 tablet 0  . Ubiquinol 100 MG CAPS Take by mouth.     Current Facility-Administered Medications on File Prior to Visit  Medication Dose Route Frequency Provider Last Rate Last Dose  . diclofenac sodium (VOLTAREN) 1 % transdermal gel 4 g  4 g Topical QID [name], DO        PAST MEDICAL HISTORY: Past Medical History:  Diagnosis Date  . Allergic rhinitis   . Arthritis   . Cataract   . Depression   . Emphysema lung (HCC)    Early stage  . Endometrial polyp   . Hyperlipidemia   . Hyperlipidemia    on simvastatin  . Hypothyroidism   . IBS (irritable bowel syndrome)   . Obesity   . Osteoarthritis   . Seasonal allergies     PAST SURGICAL HISTORY: Past Surgical History:  Procedure Laterality Date  . BUNIONECTOMY  2009   Dr.Duda, left  . EYE SURGERY    . HYSTEROSCOPY W/D&C  04/24/2011   Procedure: DILATATION AND CURETTAGE (D&C) /HYSTEROSCOPY;  Surgeon: Eldred Manges, MD;  Location: [facility];  Service: Gynecology;  Laterality: N/A;  . 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 PLANTAR FASCIA SURGERY  1994   Dr Percell Miller  . POLYPECTOMY  04/24/2011   Procedure: POLYPECTOMY;  Surgeon: Eldred Manges, MD;  Location: [facility];  Service:  Gynecology;  Laterality: N/A;    SOCIAL HISTORY: Social History   Tobacco Use  . Smoking status: Former Smoker    Packs/day: 2.00    Years: 42.00    Pack years: 84.00    Types: Cigarettes    Last attempt to quit: 07/28/2012    Years since quitting: 6.3  . Smokeless tobacco: Never Used  Substance Use Topics  . Alcohol use: Yes    Comment: rare  . Drug use: No    FAMILY HISTORY: Family History  Problem Relation Age of Onset  . Colon cancer Father 44  . Cancer Father 75       colon  . Alcoholism Father   . Pancreatic cancer Mother 35  . Cancer Mother 66       pancreatic  . Colon cancer Paternal Grandmother   . Cancer Paternal Grandmother 89       colon  . Heart attack Maternal Grandmother 50  . Heart  disease Maternal Grandmother   . Cancer Sister 75       melanoma  . Breast cancer Sister   . Cancer Brother 82       skin  . Thyroid disease Sister   . Breast cancer Maternal Aunt 30  . Heart attack Maternal Aunt 60  . Breast cancer Sister    ROS: Review of Systems  Gastrointestinal: Negative for diarrhea, nausea and vomiting.  Musculoskeletal:       Negative for muscle weakness.  Endo/Heme/Allergies:       Positive for hair loss recently. Negative for polyphagia.   PHYSICAL EXAM: Pt in no acute distress  RECENT LABS AND TESTS: BMET    Component Value Date/Time   NA 141 05/26/2018 1218   K 4.9 05/26/2018 1218   CL 102 05/26/2018 1218   CO2 26 05/26/2018 1218   GLUCOSE 88 05/26/2018 1218   GLUCOSE 97 11/25/2017 1325   GLUCOSE 93 07/16/2006 1501   BUN 25 05/26/2018 1218   CREATININE 0.71 05/26/2018 1218   CALCIUM 9.5 05/26/2018 1218   GFRNONAA 87 05/26/2018 1218   GFRAA 100 05/26/2018 1218   Lab Results  Component Value Date   HGBA1C 5.4 05/26/2018   HGBA1C 5.7 (H) 02/17/2018   HGBA1C 5.8 02/14/2017   HGBA1C 5.5 01/23/2015   HGBA1C 5.7 02/24/2014   Lab Results  Component Value Date   INSULIN 8.4 05/26/2018   INSULIN 4.2 02/17/2018   CBC     Component Value Date/Time   WBC 6.0 05/26/2018 1218   WBC 3.7 (L) 11/25/2017 1325   RBC 4.95 05/26/2018 1218   RBC 4.58 11/25/2017 1325   HGB 14.9 05/26/2018 1218   HCT 45.3 05/26/2018 1218   PLT 164.0 11/25/2017 1325   MCV 92 05/26/2018 1218   MCH 30.1 05/26/2018 1218   MCH 31.1 04/17/2011 1358   MCHC 32.9 05/26/2018 1218   MCHC 33.4 11/25/2017 1325   RDW 13.9 05/26/2018 1218   LYMPHSABS 1.3 05/26/2018 1218   MONOABS 0.4 11/25/2017 1325   EOSABS 0.1 05/26/2018 1218   BASOSABS 0.0 05/26/2018 1218   Iron/TIBC/Ferritin/ %Sat No results found for: IRON, TIBC, FERRITIN, IRONPCTSAT Lipid Panel     Component Value Date/Time   CHOL 167 05/26/2018 1218   TRIG 67 05/26/2018 1218   HDL 74 05/26/2018 1218   CHOLHDL 2 11/25/2017 1325   VLDL 16.6 11/25/2017 1325   LDLCALC 80 05/26/2018 1218   LDLDIRECT 129.1 06/13/2011 1020   Hepatic Function Panel     Component Value Date/Time   PROT 6.6 05/26/2018 1218   ALBUMIN 4.5 05/26/2018 1218   AST 17 05/26/2018 1218   ALT 16 05/26/2018 1218   ALKPHOS 102 05/26/2018 1218   BILITOT 0.4 05/26/2018 1218   BILIDIR 0.1 12/23/2014 1031      Component Value Date/Time   TSH 3.030 05/26/2018 1218   TSH 6.930 (H) 02/17/2018 1113   TSH 3.48 11/25/2017 1325   Results for BREIONNA, PUNT (MRN [id number]) as of 11/17/2018 10:06  Ref. Range 05/26/2018 12:18  Vitamin D, 25-Hydroxy Latest Ref Range: 30.0 - 100.0 ng/mL 43.6   I, Michaelene Song, am acting as Location manager for [employer], PA-C I, Abby Potash, PA-C have reviewed above note and agree with its content

## 2018-12-02 ENCOUNTER — Encounter (INDEPENDENT_AMBULATORY_CARE_PROVIDER_SITE_OTHER): Payer: Self-pay | Admitting: Physician Assistant

## 2018-12-02 ENCOUNTER — Ambulatory Visit (INDEPENDENT_AMBULATORY_CARE_PROVIDER_SITE_OTHER): Payer: 59 | Admitting: Physician Assistant

## 2018-12-02 ENCOUNTER — Other Ambulatory Visit: Payer: Self-pay

## 2018-12-02 DIAGNOSIS — Z6841 Body Mass Index (BMI) 40.0 and over, adult: Secondary | ICD-10-CM

## 2018-12-02 DIAGNOSIS — E7849 Other hyperlipidemia: Secondary | ICD-10-CM | POA: Diagnosis not present

## 2018-12-02 NOTE — Progress Notes (Signed)
Office: [phone]  /  Fax: [phone] TeleHealth Visit:  [name] [name] has verbally consented to this TeleHealth visit today. The patient is located at home, the provider is located at the [facility] office. The participants in this visit include the listed provider and patient. The visit was conducted today via telephone call.  HPI:   Chief Complaint: OBESITY [name] [name] is here to discuss her progress with her obesity treatment plan. She is on the Category 2 plan and is following her eating plan approximately 50% of the time. She states she is exercising 0 minutes 0 times per week. [name] [name] reports that she was able to get back on plan for a week and was feeling better. She did some celebration eating this weekend and got off track.  We were unable to weigh the patient today for this TeleHealth visit. She feels as if she has gained 1.5 lbs since her last visit. She has lost 41 lbs since starting treatment with [place].  Hyperlipidemia [name] [name] has hyperlipidemia and has been trying to improve her cholesterol levels with intensive lifestyle modification including a low saturated fat diet, exercise and weight loss. She is on Zocor and denies any chest pain.  ASSESSMENT AND PLAN:  Other hyperlipidemia  Class 3 severe obesity with serious comorbidity and body mass index (BMI) of 40.0 to 44.9 in adult, unspecified obesity type ([name] [name])  PLAN:  Hyperlipidemia [name] [name] was informed of the American Heart Association Guidelines emphasizing intensive lifestyle modifications as the first line treatment for hyperlipidemia. We discussed many lifestyle modifications today in depth, and [name] [name] will continue to work on decreasing saturated fats such as fatty red meat, butter and many fried foods. She will continue Zocor, increase vegetables and lean protein in her diet, and continue to work on exercise and weight loss efforts.  Obesity [name] [name] is currently in the action stage of change. As such,  her goal is to continue with weight loss efforts. She has agreed to follow the Category 2 plan. [name] [name] has been instructed to work up to a goal of 150 minutes of combined cardio and strengthening exercise per week for weight loss and overall health benefits. We discussed the following Behavioral Modification Strategies today: increasing lean protein intake and decreasing simple carbohydrates.  [name] [name] has agreed to follow-up with our clinic in 2 weeks. She was informed of the importance of frequent follow-up visits to maximize her success with intensive lifestyle modifications for her multiple health conditions.  ALLERGIES: No Known Allergies  MEDICATIONS: Current Outpatient Medications on File Prior to Visit  Medication Sig Dispense Refill  . diclofenac sodium (VOLTAREN) 1 % GEL APPLY 4 GRAMS TOPICALLY TO  SKIN 4 TIMES DAILY. 400 g 1  . levothyroxine (SYNTHROID) 112 MCG tablet Take 1 tablet (112 mcg total) by mouth daily. 30 tablet 1  . loperamide (IMODIUM) 2 MG capsule TAKE 1 CAPSULE BY MOUTH TWO TIMES DAILY 180 capsule 1  . metFORMIN (GLUCOPHAGE) 500 MG tablet Take 1 tablet (500 mg total) by mouth daily with breakfast. 30 tablet 1  . multivitamin (THERAGRAN) per tablet Take 1 tablet by mouth daily.      . sertraline (ZOLOFT) 50 MG tablet TAKE 1 TABLET BY MOUTH  DAILY 90 tablet 1  . simvastatin (ZOCOR) 20 MG tablet TAKE 1 TABLET BY MOUTH AT  BEDTIME 90 tablet 0  . Ubiquinol 100 MG CAPS Take by mouth.    . Vitamin D, Ergocalciferol, (DRISDOL) 1.25 MG (50000 UT) CAPS capsule Take 1 capsule (50,000  Units total) by mouth every 7 (seven) days. 4 capsule 0   Current Facility-Administered Medications on File Prior to Visit  Medication Dose Route Frequency Provider Last Rate Last Dose  . diclofenac sodium (VOLTAREN) 1 % transdermal gel 4 g  4 g Topical QID [name], DO        PAST MEDICAL HISTORY: Past Medical History:  Diagnosis Date  . Allergic rhinitis   . Arthritis   .  Cataract   . Depression   . Emphysema lung (HCC)    Early stage  . Endometrial polyp   . Hyperlipidemia   . Hyperlipidemia    on simvastatin  . Hypothyroidism   . IBS (irritable bowel syndrome)   . Obesity   . Osteoarthritis   . Seasonal allergies     PAST SURGICAL HISTORY: Past Surgical History:  Procedure Laterality Date  . BUNIONECTOMY  2009   Dr.Duda, left  . EYE SURGERY    . HYSTEROSCOPY W/D&C  04/24/2011   Procedure: DILATATION AND CURETTAGE (D&C) /HYSTEROSCOPY;  Surgeon: Eldred Manges, MD;  Location: [facility];  Service: Gynecology;  Laterality: N/A;  . 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 PLANTAR FASCIA SURGERY  1994   Dr Percell Miller  . POLYPECTOMY  04/24/2011   Procedure: POLYPECTOMY;  Surgeon: Eldred Manges, MD;  Location: [facility];  Service: Gynecology;  Laterality: N/A;    SOCIAL HISTORY: Social History   Tobacco Use  . Smoking status: Former Smoker    Packs/day: 2.00    Years: 42.00    Pack years: 84.00    Types: Cigarettes    Last attempt to quit: 07/28/2012    Years since quitting: 6.3  . Smokeless tobacco: Never Used  Substance Use Topics  . Alcohol use: Yes    Comment: rare  . Drug use: No    FAMILY HISTORY: Family History  Problem Relation Age of Onset  . Colon cancer Father 22  . Cancer Father 57       colon  . Alcoholism Father   . Pancreatic cancer Mother 20  . Cancer Mother 82       pancreatic  . Colon cancer Paternal Grandmother   . Cancer Paternal Grandmother 31       colon  . Heart attack Maternal Grandmother 50  . Heart disease Maternal Grandmother   . Cancer Sister 1       melanoma  . Breast cancer Sister   . Cancer Brother 64       skin  . Thyroid disease Sister   . Breast cancer Maternal Aunt 30  . Heart attack Maternal Aunt 60  . Breast cancer Sister    ROS: Review of Systems  Cardiovascular: Negative for chest pain.   PHYSICAL EXAM: Pt in no acute distress  RECENT LABS AND TESTS: BMET     Component Value Date/Time   NA 141 05/26/2018 1218   K 4.9 05/26/2018 1218   CL 102 05/26/2018 1218   CO2 26 05/26/2018 1218   GLUCOSE 88 05/26/2018 1218   GLUCOSE 97 11/25/2017 1325   GLUCOSE 93 07/16/2006 1501   BUN 25 05/26/2018 1218   CREATININE 0.71 05/26/2018 1218   CALCIUM 9.5 05/26/2018 1218   GFRNONAA 87 05/26/2018 1218   GFRAA 100 05/26/2018 1218   Lab Results  Component Value Date   HGBA1C 5.4 05/26/2018   HGBA1C 5.7 (H) 02/17/2018   HGBA1C 5.8 02/14/2017   HGBA1C 5.5 01/23/2015  HGBA1C 5.7 02/24/2014   Lab Results  Component Value Date   INSULIN 8.4 05/26/2018   INSULIN 4.2 02/17/2018   CBC    Component Value Date/Time   WBC 6.0 05/26/2018 1218   WBC 3.7 (L) 11/25/2017 1325   RBC 4.95 05/26/2018 1218   RBC 4.58 11/25/2017 1325   HGB 14.9 05/26/2018 1218   HCT 45.3 05/26/2018 1218   PLT 164.0 11/25/2017 1325   MCV 92 05/26/2018 1218   MCH 30.1 05/26/2018 1218   MCH 31.1 04/17/2011 1358   MCHC 32.9 05/26/2018 1218   MCHC 33.4 11/25/2017 1325   RDW 13.9 05/26/2018 1218   LYMPHSABS 1.3 05/26/2018 1218   MONOABS 0.4 11/25/2017 1325   EOSABS 0.1 05/26/2018 1218   BASOSABS 0.0 05/26/2018 1218   Iron/TIBC/Ferritin/ %Sat No results found for: IRON, TIBC, FERRITIN, IRONPCTSAT Lipid Panel     Component Value Date/Time   CHOL 167 05/26/2018 1218   TRIG 67 05/26/2018 1218   HDL 74 05/26/2018 1218   CHOLHDL 2 11/25/2017 1325   VLDL 16.6 11/25/2017 1325   LDLCALC 80 05/26/2018 1218   LDLDIRECT 129.1 06/13/2011 1020   Hepatic Function Panel     Component Value Date/Time   PROT 6.6 05/26/2018 1218   ALBUMIN 4.5 05/26/2018 1218   AST 17 05/26/2018 1218   ALT 16 05/26/2018 1218   ALKPHOS 102 05/26/2018 1218   BILITOT 0.4 05/26/2018 1218   BILIDIR 0.1 12/23/2014 1031      Component Value Date/Time   TSH 3.030 05/26/2018 1218   TSH 6.930 (H) 02/17/2018 1113   TSH 3.48 11/25/2017 1325   Results for BEATRIZ, QUINTELA (MRN [id number]) as of 12/02/2018  14:25  Ref. Range 05/26/2018 12:18  Vitamin D, 25-Hydroxy Latest Ref Range: 30.0 - 100.0 ng/mL 43.6    I, Michaelene Song, am acting as Location manager for [employer], PA-C I, Abby Potash, PA-C have reviewed above note and agree with its content

## 2018-12-15 ENCOUNTER — Telehealth: Payer: Self-pay | Admitting: *Deleted

## 2018-12-15 NOTE — Telephone Encounter (Signed)
Copied from [place] [id number]. Topic: Appointment Scheduling - Scheduling Inquiry for Clinic >> Dec 14, 2018  4:10 PM [name] [name] wrote: Reason for CRM: [facility] is calling to schedule an appt with Dr. Carollee Herter. She did not say what the appt was fore

## 2018-12-15 NOTE — Telephone Encounter (Signed)
Tried to schedule patient and she said she needs to think a little more about it and she will call us back.

## 2018-12-16 ENCOUNTER — Ambulatory Visit (INDEPENDENT_AMBULATORY_CARE_PROVIDER_SITE_OTHER): Payer: 59 | Admitting: Physician Assistant

## 2019-01-06 ENCOUNTER — Encounter: Payer: Self-pay | Admitting: Family Medicine

## 2019-01-06 ENCOUNTER — Other Ambulatory Visit (HOSPITAL_BASED_OUTPATIENT_CLINIC_OR_DEPARTMENT_OTHER): Payer: Self-pay | Admitting: Family Medicine

## 2019-01-06 DIAGNOSIS — Z1231 Encounter for screening mammogram for malignant neoplasm of breast: Secondary | ICD-10-CM

## 2019-01-15 ENCOUNTER — Other Ambulatory Visit: Payer: Self-pay | Admitting: Family Medicine

## 2019-01-15 DIAGNOSIS — F419 Anxiety disorder, unspecified: Secondary | ICD-10-CM

## 2019-01-21 ENCOUNTER — Other Ambulatory Visit: Payer: Self-pay | Admitting: Family Medicine

## 2019-01-21 DIAGNOSIS — E785 Hyperlipidemia, unspecified: Secondary | ICD-10-CM

## 2019-02-04 ENCOUNTER — Other Ambulatory Visit: Payer: Self-pay

## 2019-02-05 ENCOUNTER — Ambulatory Visit (HOSPITAL_BASED_OUTPATIENT_CLINIC_OR_DEPARTMENT_OTHER)
Admission: RE | Admit: 2019-02-05 | Discharge: 2019-02-05 | Disposition: A | Discharge status: Home / Self Care | Payer: 59 | Source: Ambulatory Visit | Attending: Acute Care | Admitting: Acute Care

## 2019-02-05 ENCOUNTER — Ambulatory Visit (HOSPITAL_BASED_OUTPATIENT_CLINIC_OR_DEPARTMENT_OTHER)
Admission: RE | Admit: 2019-02-05 | Discharge: 2019-02-05 | Disposition: A | Discharge status: Home / Self Care | Payer: 59 | Source: Ambulatory Visit | Attending: Family Medicine | Admitting: Family Medicine

## 2019-02-05 DIAGNOSIS — Z1231 Encounter for screening mammogram for malignant neoplasm of breast: Secondary | ICD-10-CM | POA: Diagnosis present

## 2019-02-05 DIAGNOSIS — Z87891 Personal history of nicotine dependence: Secondary | ICD-10-CM | POA: Diagnosis present

## 2019-02-05 DIAGNOSIS — Z122 Encounter for screening for malignant neoplasm of respiratory organs: Secondary | ICD-10-CM

## 2019-02-09 ENCOUNTER — Other Ambulatory Visit: Payer: Self-pay

## 2019-02-09 ENCOUNTER — Ambulatory Visit (INDEPENDENT_AMBULATORY_CARE_PROVIDER_SITE_OTHER): Payer: 59 | Admitting: Family Medicine

## 2019-02-09 ENCOUNTER — Encounter: Payer: Self-pay | Admitting: Family Medicine

## 2019-02-09 VITALS — BP 135/61 | HR 74 | Temp 98.4°F | Resp 18 | Ht 60.0 in | Wt 229.4 lb

## 2019-02-09 DIAGNOSIS — F419 Anxiety disorder, unspecified: Secondary | ICD-10-CM

## 2019-02-09 DIAGNOSIS — R197 Diarrhea, unspecified: Secondary | ICD-10-CM

## 2019-02-09 DIAGNOSIS — E038 Other specified hypothyroidism: Secondary | ICD-10-CM | POA: Diagnosis not present

## 2019-02-09 DIAGNOSIS — I10 Essential (primary) hypertension: Secondary | ICD-10-CM

## 2019-02-09 DIAGNOSIS — Z6841 Body Mass Index (BMI) 40.0 and over, adult: Secondary | ICD-10-CM

## 2019-02-09 DIAGNOSIS — L659 Nonscarring hair loss, unspecified: Secondary | ICD-10-CM | POA: Diagnosis not present

## 2019-02-09 DIAGNOSIS — E559 Vitamin D deficiency, unspecified: Secondary | ICD-10-CM | POA: Diagnosis not present

## 2019-02-09 DIAGNOSIS — E785 Hyperlipidemia, unspecified: Secondary | ICD-10-CM | POA: Diagnosis not present

## 2019-02-09 DIAGNOSIS — E039 Hypothyroidism, unspecified: Secondary | ICD-10-CM

## 2019-02-09 LAB — CBC WITH DIFFERENTIAL/PLATELET
Basophils Absolute: 0 10*3/uL (ref 0.0–0.1)
Basophils Relative: 0.7 % (ref 0.0–3.0)
Eosinophils Absolute: 0.1 10*3/uL (ref 0.0–0.7)
Eosinophils Relative: 1.9 % (ref 0.0–5.0)
HCT: 42 % (ref 36.0–46.0)
Hemoglobin: 13.9 g/dL (ref 12.0–15.0)
Lymphocytes Relative: 27.4 % (ref 12.0–46.0)
Lymphs Abs: 1.1 10*3/uL (ref 0.7–4.0)
MCHC: 33.1 g/dL (ref 30.0–36.0)
MCV: 92.4 fl (ref 78.0–100.0)
Monocytes Absolute: 0.3 10*3/uL (ref 0.1–1.0)
Monocytes Relative: 8.8 % (ref 3.0–12.0)
Neutro Abs: 2.4 10*3/uL (ref 1.4–7.7)
Neutrophils Relative %: 61.2 % (ref 43.0–77.0)
Platelets: 195 10*3/uL (ref 150.0–400.0)
RBC: 4.54 Mil/uL (ref 3.87–5.11)
RDW: 14.9 % (ref 11.5–15.5)
WBC: 4 10*3/uL (ref 4.0–10.5)

## 2019-02-09 LAB — LIPID PANEL
Cholesterol: 155 mg/dL (ref 0–200)
HDL: 75.8 mg/dL (ref >39.00)
LDL Cholesterol: 68 mg/dL (ref 0–99)
NonHDL: 79.66
Total CHOL/HDL Ratio: 2
Triglycerides: 60 mg/dL (ref 0.0–149.0)
VLDL: 12 mg/dL (ref 0.0–40.0)

## 2019-02-09 LAB — VITAMIN B12: Vitamin B-12: >1500 pg/mL — ABNORMAL HIGH (ref 211–911)

## 2019-02-09 LAB — VITAMIN D 25 HYDROXY (VIT D DEFICIENCY, FRACTURES): VITD: 36.83 ng/mL (ref 30.00–100.00)

## 2019-02-09 LAB — HEMOGLOBIN A1C: Hgb A1c MFr Bld: 5.7 % (ref 4.6–6.5)

## 2019-02-09 MED ORDER — VITAMIN D (ERGOCALCIFEROL) 1.25 MG (50000 UNIT) PO CAPS: 50000.0000 [IU] | ORAL_CAPSULE | ORAL | 0 refills | Status: DC

## 2019-02-09 MED ORDER — SERTRALINE HCL 50 MG PO TABS: 50.0000 mg | ORAL_TABLET | Freq: Every day | ORAL | 3 refills | Status: DC

## 2019-02-09 MED ORDER — LOPERAMIDE HCL 2 MG PO CAPS: ORAL_CAPSULE | ORAL | 1 refills | Status: DC

## 2019-02-09 MED ORDER — LEVOTHYROXINE SODIUM 112 MCG PO TABS: 112.0000 ug | ORAL_TABLET | Freq: Every day | ORAL | 1 refills | Status: DC

## 2019-02-09 NOTE — Progress Notes (Signed)
Patient ID: [name] [name], female    DOB: 03-23-1946  Age: 73 y.o. MRN: [id number]    Subjective:  Subjective  HPI [name] [name] presents for f/u   She c/o hair loss.  She states she is eating enough protein   Review of Systems  Constitutional: Negative for appetite change, diaphoresis, fatigue and unexpected weight change.  Eyes: Negative for pain, redness and visual disturbance.  Respiratory: Negative for cough, chest tightness, shortness of breath and wheezing.   Cardiovascular: Negative for chest pain, palpitations and leg swelling.  Endocrine: Negative for cold intolerance, heat intolerance, polydipsia, polyphagia and polyuria.  Genitourinary: Negative for difficulty urinating, dysuria and frequency.  Neurological: Negative for dizziness, light-headedness, numbness and headaches.    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   Her family history includes Alcoholism in her father; Breast cancer in her sister and sister; Breast cancer (age of onset: 57) in her maternal aunt; Cancer (age of onset: 83) in her father; Cancer (age of onset: 27) in her brother and paternal grandmother; Cancer (age of onset: 86) in her sister; Cancer (age of onset: 53) in her mother; Colon cancer in her paternal grandmother; Colon cancer (age of onset: 60) in her father; Heart attack (age of onset: 28) in her maternal grandmother; Heart attack (age of onset: 58) in her maternal aunt; Heart disease  in her maternal grandmother; Pancreatic cancer (age of onset: 55) in her mother; Thyroid disease in her sister.She reports that she quit smoking about 6 years ago. Her smoking use included cigarettes. She has a 84.00 pack-year smoking history. She has never used smokeless tobacco. She reports current alcohol use. She reports that she does not use drugs.  Current Outpatient Medications on File Prior to Visit  Medication Sig Dispense Refill   diclofenac sodium (VOLTAREN) 1 % GEL APPLY 4 GRAMS TOPICALLY TO  SKIN 4 TIMES DAILY. 400 g 1   multivitamin (THERAGRAN) per tablet Take 1 tablet by mouth daily.       simvastatin (ZOCOR) 20 MG tablet TAKE 1 TABLET BY MOUTH AT  BEDTIME 90 tablet 0   Ubiquinol 100 MG CAPS Take by mouth.     metFORMIN (GLUCOPHAGE) 500 MG tablet Take 1 tablet (500 mg total) by mouth daily with breakfast. (Patient not taking: Reported on 02/09/2019) 30 tablet 1   Current Facility-Administered Medications on File Prior to Visit  Medication Dose Route Frequency Provider Last Rate Last Dose   diclofenac sodium (VOLTAREN) 1 % transdermal gel 4 g  4 g Topical QID Carollee Herter, Deja Pisarski R, DO         Objective:  Objective  Physical Exam Constitutional:      Appearance: She is well-developed.  HENT:     Head: Normocephalic and atraumatic.  Eyes:     Conjunctiva/sclera: Conjunctivae normal.  Neck:     Musculoskeletal: Normal range of motion and neck supple.     Thyroid:  No thyromegaly.     Vascular: No carotid bruit or JVD.  Cardiovascular:     Rate and Rhythm: Normal rate and regular rhythm.     Heart sounds: Normal heart sounds. No murmur.  Pulmonary:     Effort: Pulmonary effort is normal. No respiratory distress.     Breath sounds: Normal breath sounds. No wheezing or rales.  Chest:     Chest wall: No tenderness.  Neurological:     Mental Status: She is alert and oriented to person, place, and time.    BP 135/61 (BP Location: Left Arm, Patient Position: Sitting,  Cuff Size: Normal)    Pulse 74    Temp 98.4 F (36.9 C) (Oral)    Resp 18    Ht 5' (1.524 m)    Wt 229 lb 6.4 oz (104.1 kg)    SpO2 96%    BMI 44.80 kg/m  Wt Readings from Last 3 Encounters:  02/09/19 229 lb 6.4 oz (104.1 kg)  09/09/18 212 lb (96.2 kg)  08/26/18 218 lb (98.9 kg)     Lab Results  Component Value Date   WBC 4.0 02/09/2019   HGB 13.9 02/09/2019   HCT 42.0 02/09/2019   PLT 195.0 02/09/2019   GLUCOSE 88 05/26/2018   CHOL 155 02/09/2019   TRIG 60.0 02/09/2019   HDL 75.80 02/09/2019   LDLDIRECT 129.1 06/13/2011   LDLCALC 68 02/09/2019   ALT 16 05/26/2018   AST 17 05/26/2018   NA 141 05/26/2018   K 4.9 05/26/2018   CL 102 05/26/2018   CREATININE 0.71 05/26/2018   BUN 25 05/26/2018   CO2 26 05/26/2018   TSH 2.39 02/09/2019   HGBA1C 5.7 02/09/2019   MICROALBUR 0.2 02/01/2013    Mm 3d Screen Breast Bilateral  Result Date: 02/05/2019 CLINICAL DATA:  Screening. EXAM: DIGITAL SCREENING BILATERAL MAMMOGRAM WITH TOMO AND CAD COMPARISON:  Previous exam(s). ACR Breast Density Category a: The breast tissue is almost entirely fatty. FINDINGS: There are no findings suspicious for malignancy. Images were processed with CAD. IMPRESSION: No mammographic evidence of malignancy. A result letter of this screening mammogram will be mailed directly to the patient. RECOMMENDATION: Screening mammogram in one year. (Code:SM-B-01Y) BI-RADS CATEGORY  1: Negative. Electronically Signed   By: Curlene Dolphin M.D.   On: 02/05/2019 14:23   Ct Chest Lung Ca Screen Low Dose W/o Cm  Result Date: 02/06/2019 CLINICAL DATA:  73 year old female former smoker (quit 6 years ago) with 42 pack year history of smoking. Lung cancer screening examination. EXAM: CT CHEST WITHOUT CONTRAST LOW-DOSE FOR LUNG CANCER SCREENING TECHNIQUE: Multidetector CT imaging of the chest was performed following the standard protocol without IV contrast. COMPARISON:  Low-dose lung cancer screening chest CT 02/03/2018. FINDINGS:  Cardiovascular: Heart size is normal. There is no significant pericardial fluid, thickening or pericardial calcification. Aortic atherosclerosis. No definite coronary artery calcifications. Mediastinum/Nodes: No pathologically enlarged mediastinal or hilar lymph nodes. Please note that accurate exclusion of hilar adenopathy is limited on noncontrast CT scans. Esophagus is unremarkable in appearance. No axillary lymphadenopathy. Lungs/Pleura: Multiple small pulmonary nodules are again noted throughout the lungs bilaterally, largest of which is in the inferior segment of the lingula (axial image 182 of series 3), with a volume derived mean diameter of 4.1 mm. No larger more suspicious appearing pulmonary nodules or masses are noted. No acute consolidative airspace disease. No pleural effusions. Mild diffuse bronchial wall thickening with mild centrilobular and paraseptal emphysema. Upper Abdomen: Visualized portions are unremarkable. Musculoskeletal: There  are no aggressive appearing lytic or blastic lesions noted in the visualized portions of the skeleton. IMPRESSION: 1. Lung-RADS 2, benign appearance or behavior. Continue annual screening with low-dose chest CT without contrast in 12 months. 2. Aortic atherosclerosis. 3. Mild diffuse bronchial wall thickening with mild centrilobular and paraseptal emphysema; imaging findings suggestive of underlying COPD. Aortic Atherosclerosis (ICD10-I70.0) and Emphysema (ICD10-J43.9). Electronically Signed   By: Vinnie Langton M.D.   On: 02/06/2019 04:20     Assessment & Plan:  Plan  I have changed Rosann Auerbach S. Edberg's sertraline and loperamide. I am also having her maintain her multivitamin, Ubiquinol, diclofenac sodium, metFORMIN, simvastatin, levothyroxine, and Vitamin D (Ergocalciferol). We will continue to administer diclofenac sodium.  Meds ordered this encounter  Medications   levothyroxine (SYNTHROID) 112 MCG tablet    Sig: Take 1 tablet (112 mcg total) by mouth  daily.    Dispense:  90 tablet    Refill:  1   sertraline (ZOLOFT) 50 MG tablet    Sig: Take 1 tablet (50 mg total) by mouth daily.    Dispense:  90 tablet    Refill:  3    Pt needs visit.   loperamide (IMODIUM) 2 MG capsule    Sig: As directed    Dispense:  180 capsule    Refill:  1   Vitamin D, Ergocalciferol, (DRISDOL) 1.25 MG (50000 UT) CAPS capsule    Sig: Take 1 capsule (50,000 Units total) by mouth every 7 (seven) days.    Dispense:  4 capsule    Refill:  0    Problem List Items Addressed This Visit      Unprioritized   Anxiety   Relevant Medications   sertraline (ZOLOFT) 50 MG tablet   Class 3 severe obesity due to excess calories with serious comorbidity and body mass index (BMI) of 40.0 to 44.9 in adult [facility])   Relevant Orders   Hemoglobin A1c (Completed)   Vitamin D (25 hydroxy) (Completed)   Diarrhea - Primary   Relevant Medications   loperamide (IMODIUM) 2 MG capsule   Essential hypertension    Well controlled, no changes to meds. Encouraged heart healthy diet such as the DASH diet and exercise as tolerated.       Hair loss    Check labs Consider derm referral      Relevant Orders   Thyroid Panel With TSH (Completed)   Vitamin B12 (Completed)   Hyperlipidemia    Tolerating statin, encouraged heart healthy diet, avoid trans fats, minimize simple carbs and saturated fats. Increase exercise as tolerated      Relevant Orders   Lipid panel (Completed)   Hypothyroidism    Check labs       Relevant Medications   levothyroxine (SYNTHROID) 112 MCG tablet   Other Relevant Orders   Thyroid Panel With TSH (Completed)   Vitamin D deficiency   Relevant Medications   Vitamin D, Ergocalciferol, (DRISDOL) 1.25 MG (50000 UT) CAPS capsule   Other Relevant Orders   CBC with Differential/Platelet (Completed)   Vitamin D (25 hydroxy) (Completed)      Follow-up: Return in about 6 months (around 08/12/2019) for fasting, annual exam.  [name],  DO

## 2019-02-09 NOTE — Patient Instructions (Signed)

## 2019-02-10 LAB — THYROID PANEL WITH TSH
Free Thyroxine Index: 3.2 (ref 1.4–3.8)
T3 Uptake: 32 % (ref 22–35)
T4, Total: 9.9 ug/dL (ref 5.1–11.9)
TSH: 2.39 mIU/L (ref 0.40–4.50)

## 2019-02-15 ENCOUNTER — Other Ambulatory Visit: Payer: Self-pay | Admitting: *Deleted

## 2019-02-15 DIAGNOSIS — Z87891 Personal history of nicotine dependence: Secondary | ICD-10-CM

## 2019-02-15 DIAGNOSIS — Z122 Encounter for screening for malignant neoplasm of respiratory organs: Secondary | ICD-10-CM

## 2019-02-17 DIAGNOSIS — L659 Nonscarring hair loss, unspecified: Secondary | ICD-10-CM | POA: Insufficient documentation

## 2019-02-17 DIAGNOSIS — E559 Vitamin D deficiency, unspecified: Secondary | ICD-10-CM | POA: Insufficient documentation

## 2019-02-17 DIAGNOSIS — Z6841 Body Mass Index (BMI) 40.0 and over, adult: Secondary | ICD-10-CM | POA: Insufficient documentation

## 2019-02-17 DIAGNOSIS — I10 Essential (primary) hypertension: Secondary | ICD-10-CM | POA: Insufficient documentation

## 2019-02-17 DIAGNOSIS — R197 Diarrhea, unspecified: Secondary | ICD-10-CM | POA: Insufficient documentation

## 2019-02-17 NOTE — Assessment & Plan Note (Signed)
Well controlled, no changes to meds. Encouraged heart healthy diet such as the DASH diet and exercise as tolerated.  °

## 2019-02-17 NOTE — Assessment & Plan Note (Signed)
Tolerating statin, encouraged heart healthy diet, avoid trans fats, minimize simple carbs and saturated fats. Increase exercise as tolerated 

## 2019-02-17 NOTE — Assessment & Plan Note (Signed)
Check labs Consider derm referral

## 2019-02-17 NOTE — Assessment & Plan Note (Signed)
Check labs 

## 2019-04-08 ENCOUNTER — Other Ambulatory Visit: Payer: Self-pay | Admitting: Family Medicine

## 2019-04-08 DIAGNOSIS — E785 Hyperlipidemia, unspecified: Secondary | ICD-10-CM

## 2019-04-30 ENCOUNTER — Encounter: Payer: Self-pay | Admitting: Family Medicine

## 2019-04-30 DIAGNOSIS — E559 Vitamin D deficiency, unspecified: Secondary | ICD-10-CM

## 2019-04-30 MED ORDER — VITAMIN D (ERGOCALCIFEROL) 1.25 MG (50000 UNIT) PO CAPS: 50000.0000 [IU] | ORAL_CAPSULE | ORAL | 2 refills | Status: DC

## 2019-05-19 ENCOUNTER — Encounter: Payer: Self-pay | Admitting: Family Medicine

## 2019-05-19 ENCOUNTER — Other Ambulatory Visit: Payer: Self-pay | Admitting: Family Medicine

## 2019-05-19 DIAGNOSIS — E038 Other specified hypothyroidism: Secondary | ICD-10-CM

## 2019-05-21 ENCOUNTER — Other Ambulatory Visit: Payer: Self-pay | Admitting: *Deleted

## 2019-05-21 ENCOUNTER — Other Ambulatory Visit: Payer: Self-pay | Admitting: Family Medicine

## 2019-05-21 DIAGNOSIS — E038 Other specified hypothyroidism: Secondary | ICD-10-CM

## 2019-05-21 MED ORDER — LOPERAMIDE HCL 2 MG PO CAPS: 2.0000 mg | ORAL_CAPSULE | Freq: Two times a day (BID) | ORAL | 1 refills | Status: DC

## 2019-06-27 ENCOUNTER — Other Ambulatory Visit: Payer: Self-pay | Admitting: Family Medicine

## 2019-06-27 DIAGNOSIS — E559 Vitamin D deficiency, unspecified: Secondary | ICD-10-CM

## 2019-08-17 ENCOUNTER — Other Ambulatory Visit: Payer: Self-pay | Admitting: Family Medicine

## 2019-08-17 DIAGNOSIS — E559 Vitamin D deficiency, unspecified: Secondary | ICD-10-CM

## 2019-08-19 NOTE — Telephone Encounter (Signed)
Pt requesting Vit D 50,000u refill. 06/29/19 refill says take for 4 weeks then stop. Pt is due for repeat Vit D per 01/2019 result note but has no appointments scheduled.  Please advise refill and follow up?

## 2019-08-19 NOTE — Telephone Encounter (Signed)
Ok to refill med but pt needs labs

## 2019-09-09 ENCOUNTER — Ambulatory Visit: Payer: 59 | Attending: Internal Medicine

## 2019-09-09 DIAGNOSIS — Z23 Encounter for immunization: Secondary | ICD-10-CM | POA: Insufficient documentation

## 2019-09-09 NOTE — Progress Notes (Signed)
   Covid-19 Vaccination Clinic  Name:  TORRYN HUNTING    MRN: [id number] DOB: 12-15-1945  09/09/2019  Ms. Hoopes was observed post Covid-19 immunization for 15 minutes without incidence. She was provided with Vaccine Information Sheet and instruction to access the V-Safe system.   Ms. Woodrow was instructed to call 911 with any severe reactions post vaccine: Marland Kitchen Difficulty breathing  . Swelling of your face and throat  . A fast heartbeat  . A bad rash all over your body  . Dizziness and weakness    Immunizations Administered    Name Date Dose VIS Date Route   Pfizer COVID-19 Vaccine 09/09/2019  2:41 PM 0.3 mL 06/25/2019 Intramuscular   Manufacturer: [place]   Lot: [id number]   [place]: [id number]

## 2019-10-05 ENCOUNTER — Ambulatory Visit: Payer: 59 | Attending: Internal Medicine

## 2019-10-05 DIAGNOSIS — Z23 Encounter for immunization: Secondary | ICD-10-CM

## 2019-10-05 NOTE — Progress Notes (Signed)
   Covid-19 Vaccination Clinic  Name:  [name] [name]    MRN: [id number] DOB: 1946/01/26  10/05/2019  [name] was observed post Covid-19 immunization for 15 minutes without incident. She was provided with Vaccine Information Sheet and instruction to access the V-Safe system.   [name] was instructed to call 911 with any severe reactions post vaccine: Marland Kitchen Difficulty breathing  . Swelling of face and throat  . A fast heartbeat  . A bad rash all over body  . Dizziness and weakness   Immunizations Administered    Name Date Dose VIS Date Route   Pfizer COVID-19 Vaccine 10/05/2019  2:36 PM 0.3 mL 06/25/2019 Intramuscular   Manufacturer: [place]   Lot: [id number]   [place]: [id number]

## 2019-12-05 ENCOUNTER — Other Ambulatory Visit: Payer: Self-pay | Admitting: Family Medicine

## 2019-12-05 DIAGNOSIS — F419 Anxiety disorder, unspecified: Secondary | ICD-10-CM

## 2020-01-04 ENCOUNTER — Other Ambulatory Visit (HOSPITAL_BASED_OUTPATIENT_CLINIC_OR_DEPARTMENT_OTHER): Payer: Self-pay | Admitting: Family Medicine

## 2020-01-04 DIAGNOSIS — Z1231 Encounter for screening mammogram for malignant neoplasm of breast: Secondary | ICD-10-CM

## 2020-02-11 ENCOUNTER — Ambulatory Visit (HOSPITAL_BASED_OUTPATIENT_CLINIC_OR_DEPARTMENT_OTHER)
Admission: RE | Admit: 2020-02-11 | Discharge: 2020-02-11 | Disposition: A | Discharge status: Home / Self Care | Payer: 59 | Source: Ambulatory Visit | Attending: Acute Care | Admitting: Acute Care

## 2020-02-11 ENCOUNTER — Ambulatory Visit (HOSPITAL_BASED_OUTPATIENT_CLINIC_OR_DEPARTMENT_OTHER)
Admission: RE | Admit: 2020-02-11 | Discharge: 2020-02-11 | Disposition: A | Discharge status: Home / Self Care | Payer: 59 | Source: Ambulatory Visit | Attending: Family Medicine | Admitting: Family Medicine

## 2020-02-11 ENCOUNTER — Other Ambulatory Visit: Payer: Self-pay

## 2020-02-11 DIAGNOSIS — Z122 Encounter for screening for malignant neoplasm of respiratory organs: Secondary | ICD-10-CM

## 2020-02-11 DIAGNOSIS — Z87891 Personal history of nicotine dependence: Secondary | ICD-10-CM

## 2020-02-11 DIAGNOSIS — Z1231 Encounter for screening mammogram for malignant neoplasm of breast: Secondary | ICD-10-CM | POA: Insufficient documentation

## 2020-02-15 ENCOUNTER — Telehealth: Payer: Self-pay | Admitting: Family Medicine

## 2020-02-15 ENCOUNTER — Other Ambulatory Visit: Payer: Self-pay | Admitting: Family Medicine

## 2020-02-15 DIAGNOSIS — I10 Essential (primary) hypertension: Secondary | ICD-10-CM

## 2020-02-15 DIAGNOSIS — E1169 Type 2 diabetes mellitus with other specified complication: Secondary | ICD-10-CM

## 2020-02-15 DIAGNOSIS — E785 Hyperlipidemia, unspecified: Secondary | ICD-10-CM

## 2020-02-15 DIAGNOSIS — E039 Hypothyroidism, unspecified: Secondary | ICD-10-CM

## 2020-02-15 NOTE — Telephone Encounter (Signed)
Order is in.

## 2020-02-15 NOTE — Telephone Encounter (Signed)
New Message:   Pt is calling and states she would like to come in before her CPE appt on 03/06/20 to get her labs done. Pt would like a call when the orders have been placed. Please advise.

## 2020-02-15 NOTE — Telephone Encounter (Signed)
Spoke with patient. Lab appointment made

## 2020-02-15 NOTE — Telephone Encounter (Signed)
Please advise 

## 2020-02-24 ENCOUNTER — Other Ambulatory Visit: Payer: Self-pay | Admitting: *Deleted

## 2020-02-24 DIAGNOSIS — Z87891 Personal history of nicotine dependence: Secondary | ICD-10-CM

## 2020-02-24 NOTE — Progress Notes (Signed)
Please call patient and let them  know their  low dose Ct was read as a Lung RADS 2: nodules that are benign in appearance and behavior with a very low likelihood of becoming a clinically active cancer due to size or lack of growth. Recommendation per radiology is for a repeat LDCT in 12 months. . Please place order for annual  screening scan for  02/2021 and fax results to PCP. Thanks so much. 

## 2020-02-28 ENCOUNTER — Other Ambulatory Visit: Payer: Self-pay | Admitting: Family Medicine

## 2020-02-28 DIAGNOSIS — F419 Anxiety disorder, unspecified: Secondary | ICD-10-CM

## 2020-03-02 ENCOUNTER — Other Ambulatory Visit (INDEPENDENT_AMBULATORY_CARE_PROVIDER_SITE_OTHER): Payer: 59

## 2020-03-02 ENCOUNTER — Other Ambulatory Visit: Payer: Self-pay

## 2020-03-02 DIAGNOSIS — E039 Hypothyroidism, unspecified: Secondary | ICD-10-CM

## 2020-03-02 DIAGNOSIS — E785 Hyperlipidemia, unspecified: Secondary | ICD-10-CM | POA: Diagnosis not present

## 2020-03-02 DIAGNOSIS — E1169 Type 2 diabetes mellitus with other specified complication: Secondary | ICD-10-CM

## 2020-03-02 DIAGNOSIS — I10 Essential (primary) hypertension: Secondary | ICD-10-CM | POA: Diagnosis not present

## 2020-03-02 LAB — COMPREHENSIVE METABOLIC PANEL
ALT: 13 U/L (ref 0–35)
AST: 20 U/L (ref 0–37)
Albumin: 4.3 g/dL (ref 3.5–5.2)
Alkaline Phosphatase: 85 U/L (ref 39–117)
BUN: 24 mg/dL — ABNORMAL HIGH (ref 6–23)
CO2: 27 mEq/L (ref 19–32)
Calcium: 9.5 mg/dL (ref 8.4–10.5)
Chloride: 103 mEq/L (ref 96–112)
Creatinine, Ser: 0.71 mg/dL (ref 0.40–1.20)
GFR: 80.97 mL/min (ref >60.00)
Glucose, Bld: 116 mg/dL — ABNORMAL HIGH (ref 70–99)
Potassium: 4.5 mEq/L (ref 3.5–5.1)
Sodium: 138 mEq/L (ref 135–145)
Total Bilirubin: 0.4 mg/dL (ref 0.2–1.2)
Total Protein: 6.5 g/dL (ref 6.0–8.3)

## 2020-03-02 LAB — CBC WITH DIFFERENTIAL/PLATELET
Basophils Absolute: 0 10*3/uL (ref 0.0–0.1)
Basophils Relative: 0.7 % (ref 0.0–3.0)
Eosinophils Absolute: 0.1 10*3/uL (ref 0.0–0.7)
Eosinophils Relative: 2.6 % (ref 0.0–5.0)
HCT: 43 % (ref 36.0–46.0)
Hemoglobin: 14.3 g/dL (ref 12.0–15.0)
Lymphocytes Relative: 31.1 % (ref 12.0–46.0)
Lymphs Abs: 1.4 10*3/uL (ref 0.7–4.0)
MCHC: 33.3 g/dL (ref 30.0–36.0)
MCV: 91.5 fl (ref 78.0–100.0)
Monocytes Absolute: 0.4 10*3/uL (ref 0.1–1.0)
Monocytes Relative: 8.5 % (ref 3.0–12.0)
Neutro Abs: 2.5 10*3/uL (ref 1.4–7.7)
Neutrophils Relative %: 57.1 % (ref 43.0–77.0)
Platelets: 178 10*3/uL (ref 150.0–400.0)
RBC: 4.7 Mil/uL (ref 3.87–5.11)
RDW: 14.3 % (ref 11.5–15.5)
WBC: 4.5 10*3/uL (ref 4.0–10.5)

## 2020-03-02 LAB — LIPID PANEL
Cholesterol: 155 mg/dL (ref 0–200)
HDL: 72.3 mg/dL (ref >39.00)
LDL Cholesterol: 70 mg/dL (ref 0–99)
NonHDL: 82.54
Total CHOL/HDL Ratio: 2
Triglycerides: 65 mg/dL (ref 0.0–149.0)
VLDL: 13 mg/dL (ref 0.0–40.0)

## 2020-03-02 LAB — HEMOGLOBIN A1C: Hgb A1c MFr Bld: 5.7 % (ref 4.6–6.5)

## 2020-03-02 LAB — TSH: TSH: 6.92 u[IU]/mL — ABNORMAL HIGH (ref 0.35–4.50)

## 2020-03-03 LAB — INSULIN, RANDOM: Insulin: 11.8 u[IU]/mL

## 2020-03-05 ENCOUNTER — Other Ambulatory Visit: Payer: Self-pay | Admitting: Family Medicine

## 2020-03-05 DIAGNOSIS — E785 Hyperlipidemia, unspecified: Secondary | ICD-10-CM

## 2020-03-06 ENCOUNTER — Ambulatory Visit (INDEPENDENT_AMBULATORY_CARE_PROVIDER_SITE_OTHER): Payer: 59 | Admitting: Family Medicine

## 2020-03-06 ENCOUNTER — Other Ambulatory Visit: Payer: Self-pay

## 2020-03-06 ENCOUNTER — Encounter: Payer: Self-pay | Admitting: Family Medicine

## 2020-03-06 VITALS — BP 120/70 | HR 63 | Temp 98.3°F | Ht 60.0 in | Wt 243.2 lb

## 2020-03-06 DIAGNOSIS — F418 Other specified anxiety disorders: Secondary | ICD-10-CM | POA: Diagnosis not present

## 2020-03-06 DIAGNOSIS — E039 Hypothyroidism, unspecified: Secondary | ICD-10-CM

## 2020-03-06 DIAGNOSIS — Z6841 Body Mass Index (BMI) 40.0 and over, adult: Secondary | ICD-10-CM

## 2020-03-06 DIAGNOSIS — E785 Hyperlipidemia, unspecified: Secondary | ICD-10-CM

## 2020-03-06 DIAGNOSIS — R7303 Prediabetes: Secondary | ICD-10-CM

## 2020-03-06 DIAGNOSIS — I10 Essential (primary) hypertension: Secondary | ICD-10-CM

## 2020-03-06 DIAGNOSIS — H9193 Unspecified hearing loss, bilateral: Secondary | ICD-10-CM | POA: Diagnosis not present

## 2020-03-06 DIAGNOSIS — Z20822 Contact with and (suspected) exposure to covid-19: Secondary | ICD-10-CM

## 2020-03-06 MED ORDER — LEVOTHYROXINE SODIUM 125 MCG PO TABS: 125.0000 ug | ORAL_TABLET | Freq: Every day | ORAL | 3 refills | Status: DC

## 2020-03-06 MED ORDER — METFORMIN HCL 500 MG PO TABS: 500.0000 mg | ORAL_TABLET | Freq: Every day | ORAL | 1 refills | Status: DC

## 2020-03-06 MED ORDER — SERTRALINE HCL 100 MG PO TABS: 100.0000 mg | ORAL_TABLET | Freq: Every day | ORAL | 3 refills | Status: DC

## 2020-03-06 NOTE — Assessment & Plan Note (Signed)
Tolerating statin, encouraged heart healthy diet, avoid trans fats, minimize simple carbs and saturated fats. Increase exercise as tolerated 

## 2020-03-06 NOTE — Assessment & Plan Note (Signed)
Well controlled, no changes to meds. Encouraged heart healthy diet such as the DASH diet and exercise as tolerated.  °

## 2020-03-06 NOTE — Assessment & Plan Note (Signed)
  Lab Results  Component Value Date   TSH 6.92 (H) 03/02/2020   Inc synthroid to 125 mcg daily  Recheck  2 months

## 2020-03-06 NOTE — Progress Notes (Signed)
Patient ID: [name] [name], female    DOB: [birth year]  Age: 74 y.o. MRN: [id number]    Subjective:  Subjective  HPI [name] [name] presents for f/u and c/o hair loss and she is frustrated with her weight gain and c/o hearing loss and fatigue   Review of Systems  Constitutional: Positive for fatigue and unexpected weight change. Negative for activity change and appetite change.  HENT: Positive for hearing loss. Negative for congestion, ear discharge and ear pain.   Respiratory: Negative for cough and shortness of breath.   Cardiovascular: Negative for chest pain and palpitations.  Gastrointestinal: Negative for abdominal distention, abdominal pain, anal bleeding, blood in stool, constipation, diarrhea, nausea, rectal pain and vomiting.  Psychiatric/Behavioral: Negative for behavioral problems and dysphoric mood. The patient is not nervous/anxious.     History Past Medical History:  Diagnosis Date  . Allergic rhinitis   . Arthritis   . Cataract   . Depression   . Emphysema lung (HCC)    Early stage  . Endometrial polyp   . Hyperlipidemia   . Hyperlipidemia    on simvastatin  . Hypothyroidism   . IBS (irritable bowel syndrome)   . Obesity   . Osteoarthritis   . 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   Her family history includes Alcoholism in her father; Breast cancer in her sister and sister; Breast cancer (age of onset: 71) in her maternal aunt; Cancer (age of onset: 27) in her father; Cancer (age of onset: 57) in her brother and paternal grandmother; Cancer (age of onset: 50) in her sister; Cancer (age of onset: 72) in her mother; Colon cancer in her paternal grandmother; Colon cancer (age of onset: 65) in her father; Heart attack (age of onset: 81) in her maternal grandmother;  Heart attack (age of onset: 47) in her maternal aunt; Heart disease in her maternal grandmother; Pancreatic cancer (age of onset: 45) in her mother; Thyroid disease in her sister.She reports that she quit smoking about 7 years ago. Her smoking use included cigarettes. She has a 84.00 pack-year smoking history. She has never used smokeless tobacco. She reports current alcohol use. She reports that she does not use drugs.  Current Outpatient Medications on File Prior to Visit  Medication Sig Dispense Refill  . diclofenac sodium (VOLTAREN) 1 % GEL APPLY 4 GRAMS TOPICALLY TO  SKIN 4 TIMES DAILY. 400 g 1  . loperamide (IMODIUM) 2 MG capsule Take 1 capsule (2 mg total) by mouth 2 (two) times daily. 180 capsule 1  . multivitamin (THERAGRAN) per tablet Take 1 tablet by mouth daily.      . Vitamin D, Ergocalciferol, (DRISDOL) 1.25 MG (50000 UNIT) CAPS capsule TAKE 1 CAPSULE BY MOUTH  EVERY 7 DAYS FOR 4 MORE  WEEKS THEN STOP 4 capsule 0  . simvastatin (ZOCOR) 20 MG tablet TAKE 1 TABLET BY MOUTH AT  BEDTIME 30 tablet 0   Current Facility-Administered Medications on File Prior to Visit  Medication Dose Route Frequency Provider Last Rate Last Admin  . diclofenac sodium (VOLTAREN) 1 % transdermal gel 4 g  4 g Topical QID Carollee Herter, Kendrick Fries R, DO         Objective:  Objective  Physical Exam Vitals and nursing note reviewed.  Constitutional:      Appearance: She is well-developed.  HENT:     Head: Normocephalic and atraumatic.  Eyes:     Conjunctiva/sclera: Conjunctivae normal.  Neck:     Thyroid: No thyromegaly.     Vascular: No carotid bruit or JVD.  Cardiovascular:     Rate and Rhythm: Normal rate and regular rhythm.     Heart sounds: Normal heart sounds. No murmur heard.   Pulmonary:     Effort: Pulmonary effort is normal. No respiratory distress.     Breath sounds: Normal breath sounds. No wheezing or rales.  Chest:     Chest wall: No tenderness.  Musculoskeletal:     Cervical back:  Normal range of motion and neck supple.  Neurological:     Mental Status: She is alert and oriented to person, place, and time.    BP 120/70 (BP Location: Right Arm, Patient Position: Sitting, Cuff Size: Large)   Pulse 63   Temp 98.3 F (36.8 C) (Oral)   Ht 5' (1.524 m)   Wt 243 lb 3.2 oz (110.3 kg)   SpO2 98%   BMI 47.50 kg/m  Wt Readings from Last 3 Encounters:  03/06/20 243 lb 3.2 oz (110.3 kg)  02/09/19 229 lb 6.4 oz (104.1 kg)  09/09/18 212 lb (96.2 kg)     Lab Results  Component Value Date   WBC 4.5 03/02/2020   HGB 14.3 03/02/2020   HCT 43.0 03/02/2020   PLT 178.0 03/02/2020   GLUCOSE 116 (H) 03/02/2020   CHOL 155 03/02/2020   TRIG 65.0 03/02/2020   HDL 72.30 03/02/2020   LDLDIRECT 129.1 06/13/2011   LDLCALC 70 03/02/2020   ALT 13 03/02/2020   AST 20 03/02/2020   NA 138 03/02/2020   K 4.5 03/02/2020   CL 103 03/02/2020   CREATININE 0.71 03/02/2020   BUN 24 (H) 03/02/2020   CO2 27 03/02/2020   TSH 6.92 (H) 03/02/2020   HGBA1C 5.7 03/02/2020   MICROALBUR 0.2 02/01/2013    MM 3D SCREEN BREAST BILATERAL  Result Date: 02/15/2020 CLINICAL DATA:  Screening. EXAM: DIGITAL SCREENING BILATERAL MAMMOGRAM WITH TOMO AND CAD COMPARISON:  Previous exam(s). ACR Breast Density Category b: There are scattered areas of fibroglandular density. FINDINGS: There are no findings suspicious for malignancy. Images were processed with CAD. IMPRESSION: No mammographic evidence of malignancy. A result letter of this screening mammogram will be mailed directly to the patient. RECOMMENDATION: Screening mammogram in one year. (Code:SM-B-01Y) BI-RADS CATEGORY  1: Negative. Electronically Signed   By: Abelardo Diesel M.D.   On: 02/15/2020 14:50   CT CHEST LUNG CA SCREEN LOW DOSE W/O CM  Result Date: 02/23/2020 CLINICAL DATA:  Former smoker, quit in 2014, 42+ pack-year history. EXAM: CT CHEST WITHOUT CONTRAST LOW-DOSE FOR LUNG CANCER SCREENING TECHNIQUE: Multidetector CT imaging of the chest was  performed following the standard protocol without IV contrast. COMPARISON:  02/05/2019. FINDINGS: Cardiovascular: Heart is at the upper limits of normal in size. No pericardial effusion. Mediastinum/Nodes: No pathologically enlarged mediastinal or axillary lymph nodes. Hilar regions are difficult to definitively evaluate without IV contrast but appear grossly unremarkable. Esophagus is grossly unremarkable. Lungs/Pleura: Centrilobular emphysema. Mild pulmonary parenchymal scarring, basilar dependent. Pulmonary nodules measure 3.6 mm or less in size, similar. No new or worrisome pulmonary nodules. No pleural fluid. Airway is unremarkable. Upper Abdomen: Visualized portions of the liver, gallbladder, adrenal glands, kidneys, spleen, pancreas, stomach and bowel are grossly unremarkable. No upper abdominal adenopathy. Musculoskeletal: Degenerative changes in the spine. No worrisome lytic or sclerotic lesions. IMPRESSION: 1. Lung-RADS  2, benign appearance or behavior. Continue annual screening with low-dose chest CT without contrast in 12 months. 2.  Emphysema (ICD10-J43.9). Electronically Signed   By: Lorin Picket M.D.   On: 02/23/2020 11:19     Assessment & Plan:  Plan  I have discontinued Teyla S. Bauder's Ubiquinol, levothyroxine, and sertraline. I am also having her start on levothyroxine and sertraline. Additionally, I am having her maintain her multivitamin, diclofenac sodium, loperamide, Vitamin D (Ergocalciferol), and metFORMIN. We will continue to administer diclofenac sodium.  Meds ordered this encounter  Medications  . metFORMIN (GLUCOPHAGE) 500 MG tablet    Sig: Take 1 tablet (500 mg total) by mouth daily with breakfast.    Dispense:  90 tablet    Refill:  1  . levothyroxine (SYNTHROID) 125 MCG tablet    Sig: Take 1 tablet (125 mcg total) by mouth daily.    Dispense:  90 tablet    Refill:  3  . sertraline (ZOLOFT) 100 MG tablet    Sig: Take 1 tablet (100 mg total) by mouth daily.     Dispense:  30 tablet    Refill:  3    Problem List Items Addressed This Visit      Unprioritized   Class 3 severe obesity due to excess calories with serious comorbidity and body mass index (BMI) of 40.0 to 44.9 in adult [facility])    Pt will go back to healthy weight and wellness      Relevant Medications   metFORMIN (GLUCOPHAGE) 500 MG tablet   Essential hypertension    Well controlled, no changes to meds. Encouraged heart healthy diet such as the DASH diet and exercise as tolerated.       Hyperlipidemia    Tolerating statin, encouraged heart healthy diet, avoid trans fats, minimize simple carbs and saturated fats. Increase exercise as tolerated      Hypothyroidism - Primary      Lab Results  Component Value Date   TSH 6.92 (H) 03/02/2020   Inc synthroid to 125 mcg daily  Recheck  2 months      Relevant Medications   levothyroxine (SYNTHROID) 125 MCG tablet    Other Visit Diagnoses    Prediabetes       Relevant Medications   metFORMIN (GLUCOPHAGE) 500 MG tablet   Depression with anxiety       Relevant Medications   sertraline (ZOLOFT) 100 MG tablet   Bilateral hearing loss, unspecified hearing loss type       Relevant Orders   Ambulatory referral to Audiology   Encounter for screening laboratory testing for COVID-19 virus in asymptomatic patient       Relevant Orders   SAR CoV2 Serology (COVID 19)AB(IGG)IA      Follow-up: Return in about 2 months (around 05/06/2020), or if symptoms worsen or fail to improve, for f/u depression/ anxiety and thyroid.  [name], DO

## 2020-03-06 NOTE — Patient Instructions (Signed)

## 2020-03-06 NOTE — Assessment & Plan Note (Signed)
Pt will go back to healthy weight and wellness

## 2020-03-07 ENCOUNTER — Telehealth: Payer: Self-pay

## 2020-03-07 LAB — SARS-COV-2 ANTIBODY(IGG)SPIKE,SEMI-QUANTITATIVE: SARS COV1 AB(IGG)SPIKE,SEMI QN: <1 index (ref <1.00)

## 2020-03-07 NOTE — Telephone Encounter (Signed)
Health Screening Form completed and faxed. Form sent to scan

## 2020-05-01 ENCOUNTER — Encounter: Payer: Self-pay | Admitting: Family Medicine

## 2020-05-01 DIAGNOSIS — E039 Hypothyroidism, unspecified: Secondary | ICD-10-CM

## 2020-05-01 DIAGNOSIS — F418 Other specified anxiety disorders: Secondary | ICD-10-CM

## 2020-05-01 MED ORDER — LEVOTHYROXINE SODIUM 125 MCG PO TABS: 125.0000 ug | ORAL_TABLET | Freq: Every day | ORAL | 3 refills | Status: DC

## 2020-05-01 MED ORDER — SERTRALINE HCL 100 MG PO TABS: 100.0000 mg | ORAL_TABLET | Freq: Every day | ORAL | 3 refills | Status: DC

## 2020-05-08 ENCOUNTER — Other Ambulatory Visit: Payer: Self-pay | Admitting: Family Medicine

## 2020-05-08 DIAGNOSIS — E785 Hyperlipidemia, unspecified: Secondary | ICD-10-CM

## 2020-05-08 DIAGNOSIS — E559 Vitamin D deficiency, unspecified: Secondary | ICD-10-CM

## 2020-05-09 ENCOUNTER — Encounter: Payer: Self-pay | Admitting: Family Medicine

## 2020-05-09 ENCOUNTER — Other Ambulatory Visit: Payer: Self-pay

## 2020-05-09 ENCOUNTER — Ambulatory Visit: Payer: 59 | Admitting: Family Medicine

## 2020-05-09 VITALS — BP 130/80 | HR 76 | Temp 98.3°F | Resp 18 | Ht 60.0 in | Wt 248.4 lb

## 2020-05-09 DIAGNOSIS — E8881 Metabolic syndrome: Secondary | ICD-10-CM | POA: Diagnosis not present

## 2020-05-09 DIAGNOSIS — I10 Essential (primary) hypertension: Secondary | ICD-10-CM

## 2020-05-09 DIAGNOSIS — E559 Vitamin D deficiency, unspecified: Secondary | ICD-10-CM | POA: Diagnosis not present

## 2020-05-09 DIAGNOSIS — E785 Hyperlipidemia, unspecified: Secondary | ICD-10-CM | POA: Diagnosis not present

## 2020-05-09 DIAGNOSIS — G4700 Insomnia, unspecified: Secondary | ICD-10-CM | POA: Insufficient documentation

## 2020-05-09 DIAGNOSIS — E038 Other specified hypothyroidism: Secondary | ICD-10-CM

## 2020-05-09 DIAGNOSIS — F418 Other specified anxiety disorders: Secondary | ICD-10-CM

## 2020-05-09 MED ORDER — LOPERAMIDE HCL 2 MG PO CAPS
2.0000 mg | ORAL_CAPSULE | Freq: Two times a day (BID) | ORAL | 1 refills | Status: DC
Start: 2020-05-09 — End: 2021-05-03

## 2020-05-09 MED ORDER — SERTRALINE HCL 50 MG PO TABS: ORAL_TABLET | ORAL | 3 refills | Status: DC

## 2020-05-09 MED ORDER — SIMVASTATIN 20 MG PO TABS: 20.0000 mg | ORAL_TABLET | Freq: Every day | ORAL | 1 refills | Status: DC

## 2020-05-09 MED ORDER — VITAMIN D (ERGOCALCIFEROL) 1.25 MG (50000 UNIT) PO CAPS: 50000.0000 [IU] | ORAL_CAPSULE | ORAL | 0 refills | Status: DC

## 2020-05-09 NOTE — Assessment & Plan Note (Signed)
Check labs 

## 2020-05-09 NOTE — Assessment & Plan Note (Signed)
Increase zoloft 150 mg daily  F/u 4 weeks

## 2020-05-09 NOTE — Progress Notes (Signed)
Patient ID: [name] [name], female    DOB: Feb 11, 1946  Age: 74 y.o. MRN: [id number]    Subjective:  Subjective  HPI [name] [name] presents for f/u glucose, chol and bp.   Pt also states her dentist states she has a small airway and may have sleep apnea .      Review of Systems  Constitutional: Negative for appetite change, diaphoresis, fatigue and unexpected weight change.  Eyes: Negative for pain, redness and visual disturbance.  Respiratory: Negative for cough, chest tightness, shortness of breath and wheezing.   Cardiovascular: Negative for chest pain, palpitations and leg swelling.  Endocrine: Negative for cold intolerance, heat intolerance, polydipsia, polyphagia and polyuria.  Genitourinary: Negative for difficulty urinating, dysuria and frequency.  Neurological: Negative for dizziness, light-headedness, numbness and headaches.    History Past Medical History:  Diagnosis Date  . Allergic rhinitis   . Arthritis   . Cataract   . Depression   . Emphysema lung (HCC)    Early stage  . Endometrial polyp   . Hyperlipidemia   . Hyperlipidemia    on simvastatin  . Hypothyroidism   . IBS (irritable bowel syndrome)   . Obesity   . Osteoarthritis   . 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   Her family history includes Alcoholism in her father; Breast cancer in her sister and sister; Breast cancer (age of onset: 56) in her maternal aunt; Cancer (age of onset: 64) in her father; Cancer (age of onset: 32) in her brother and paternal grandmother; Cancer (age of onset: 32) in her sister; Cancer (age of onset: 7) in her mother; Colon cancer in her paternal grandmother; Colon cancer (age of onset: 22) in her father; Heart attack (age of onset: 71) in her maternal grandmother; Heart attack  (age of onset: 53) in her maternal aunt; Heart disease in her maternal grandmother; Pancreatic cancer (age of onset: 2) in her mother; Thyroid disease in her sister.She reports that she quit smoking about 7 years ago. Her smoking use included cigarettes. She has a 84.00 pack-year smoking history. She has never used smokeless tobacco. She reports current alcohol use. She reports that she does not use drugs.  Current Outpatient Medications on File Prior to Visit  Medication Sig Dispense Refill  . diclofenac sodium (VOLTAREN) 1 % GEL APPLY 4 GRAMS TOPICALLY TO  SKIN 4 TIMES DAILY. 400 g 1  . levothyroxine (SYNTHROID) 125 MCG tablet Take 1 tablet (125 mcg total) by mouth daily. 90 tablet 3  . metFORMIN (GLUCOPHAGE) 500 MG tablet Take 1 tablet (500 mg total) by mouth daily with breakfast. 90 tablet 1  . multivitamin (THERAGRAN) per tablet Take 1 tablet by mouth daily.      . simvastatin (ZOCOR) 20 MG tablet Take 1 tablet (20 mg total) by mouth at bedtime. 90 tablet 1  . Vitamin D, Ergocalciferol, (DRISDOL) 1.25 MG (50000 UNIT) CAPS capsule Take 1 capsule (50,000 Units total) by mouth every 7 (seven) days. 4 capsule 0   No current facility-administered medications on file prior to visit.     Objective:  Objective  Physical Exam Vitals and nursing note reviewed.  Constitutional:      Appearance: She is well-developed.  HENT:     Head: Normocephalic and atraumatic.  Eyes:     Conjunctiva/sclera: Conjunctivae normal.  Neck:     Thyroid: No thyromegaly.     Vascular: No carotid bruit or JVD.  Cardiovascular:     Rate and Rhythm: Normal rate and regular rhythm.     Heart sounds: Normal heart sounds. No murmur heard.   Pulmonary:     Effort: Pulmonary effort is normal. No respiratory distress.     Breath sounds: Normal breath sounds. No wheezing or rales.  Chest:     Chest wall: No tenderness.  Musculoskeletal:     Cervical back: Normal range of motion and neck supple.  Neurological:      Mental Status: She is alert and oriented to person, place, and time.    BP 130/80 (BP Location: Right Arm, Patient Position: Sitting, Cuff Size: Large)   Pulse 76   Temp 98.3 F (36.8 C) (Oral)   Resp 18   Ht 5' (1.524 m)   Wt 248 lb 6.4 oz (112.7 kg)   SpO2 98%   BMI 48.51 kg/m  Wt Readings from Last 3 Encounters:  05/09/20 248 lb 6.4 oz (112.7 kg)  03/06/20 243 lb 3.2 oz (110.3 kg)  02/09/19 229 lb 6.4 oz (104.1 kg)     Lab Results  Component Value Date   WBC 4.5 03/02/2020   HGB 14.3 03/02/2020   HCT 43.0 03/02/2020   PLT 178.0 03/02/2020   GLUCOSE 116 (H) 03/02/2020   CHOL 155 03/02/2020   TRIG 65.0 03/02/2020   HDL 72.30 03/02/2020   LDLDIRECT 129.1 06/13/2011   LDLCALC 70 03/02/2020   ALT 13 03/02/2020   AST 20 03/02/2020   NA 138 03/02/2020   K 4.5 03/02/2020   CL 103 03/02/2020   CREATININE 0.71 03/02/2020   BUN 24 (H) 03/02/2020   CO2 27 03/02/2020   TSH 6.92 (H) 03/02/2020   HGBA1C 5.7 03/02/2020   MICROALBUR 0.2 02/01/2013    MM 3D SCREEN BREAST BILATERAL  Result Date: 02/15/2020 CLINICAL DATA:  Screening. EXAM: DIGITAL SCREENING BILATERAL MAMMOGRAM WITH TOMO AND CAD COMPARISON:  Previous exam(s). ACR Breast Density Category b: There are scattered areas of fibroglandular density. FINDINGS: There are no findings suspicious for malignancy. Images were processed with CAD. IMPRESSION: No mammographic evidence of malignancy. A result letter of this screening mammogram will be mailed directly to the patient. RECOMMENDATION: Screening mammogram in one year. (Code:[id number]-01Y) BI-RADS CATEGORY  1: Negative. Electronically Signed   By: Abelardo Diesel M.D.   On: 02/15/2020 14:50   CT CHEST LUNG CA SCREEN LOW DOSE W/O CM  Result Date: 02/23/2020 CLINICAL DATA:  Former smoker, quit in 2014, 42+ pack-year history. EXAM: CT CHEST WITHOUT CONTRAST LOW-DOSE FOR LUNG CANCER SCREENING TECHNIQUE: Multidetector CT imaging of the chest was performed following the standard protocol  without IV contrast. COMPARISON:  02/05/2019. FINDINGS: Cardiovascular: Heart is at the upper limits of normal in size. No pericardial effusion. Mediastinum/Nodes: No pathologically enlarged mediastinal or axillary lymph nodes. Hilar regions are difficult to definitively evaluate without IV contrast but appear grossly unremarkable. Esophagus is grossly unremarkable. Lungs/Pleura: Centrilobular emphysema. Mild pulmonary parenchymal scarring, basilar dependent. Pulmonary nodules measure 3.6 mm or less in size, similar. No new or worrisome pulmonary nodules. No pleural fluid. Airway is unremarkable. Upper Abdomen: Visualized portions of the liver, gallbladder, adrenal glands, kidneys, spleen, pancreas, stomach and bowel are grossly unremarkable. No upper abdominal adenopathy. Musculoskeletal: Degenerative changes in the spine. No worrisome lytic or sclerotic lesions. IMPRESSION: 1. Lung-RADS 2, benign appearance or behavior. Continue annual screening with low-dose chest CT without  contrast in 12 months. 2.  Emphysema (ICD10-J43.9). Electronically Signed   By: Lorin Picket M.D.   On: 02/23/2020 11:19     Assessment & Plan:  Plan  I have discontinued Kassady Laboy. Trigo's sertraline. I am also having her start on sertraline. Additionally, I am having her maintain her multivitamin, diclofenac sodium, metFORMIN, levothyroxine, simvastatin, and loperamide. We will stop administering diclofenac sodium.  Meds ordered this encounter  Medications  . sertraline (ZOLOFT) 50 MG tablet    Sig: 3 po qd    Dispense:  270 tablet    Refill:  3  . loperamide (IMODIUM) 2 MG capsule    Sig: Take 1 capsule (2 mg total) by mouth 2 (two) times daily.    Dispense:  180 capsule    Refill:  1    ORDER NUMBER : [id number]    Problem List Items Addressed This Visit      Unprioritized   Depression with anxiety - Primary   Relevant Medications   sertraline (ZOLOFT) 50 MG tablet   Essential hypertension    Well controlled,  no changes to meds. Encouraged heart healthy diet such as the DASH diet and exercise as tolerated.       Hyperlipidemia   Relevant Orders   Lipid panel   Comprehensive metabolic panel   Hypothyroidism    Check labs        Insomnia   Relevant Orders   Ambulatory referral to Neurology   Insulin resistance    Check labs       Relevant Orders   Comprehensive metabolic panel   Hemoglobin A1c   Insulin, random   Morbid obesity (HCC)   Relevant Orders   Lipid panel   TSH   CBC with Differential/Platelet   Comprehensive metabolic panel   Hemoglobin A1c   Insulin, random   Vitamin D (25 hydroxy)   Vitamin D deficiency   Relevant Orders   CBC with Differential/Platelet   Vitamin D (25 hydroxy)      Follow-up: Return in about 4 weeks (around 06/06/2020), or if symptoms worsen or fail to improve.  [name], DO

## 2020-05-09 NOTE — Patient Instructions (Signed)

## 2020-05-09 NOTE — Telephone Encounter (Signed)
Last vitamin d was done on 02/09/19 and was 36.83.  Do you want to continue?

## 2020-05-09 NOTE — Telephone Encounter (Signed)
Recheck vita d when we recheck tsh in 2 months Ok to refill x 1

## 2020-05-09 NOTE — Assessment & Plan Note (Signed)
Well controlled, no changes to meds. Encouraged heart healthy diet such as the DASH diet and exercise as tolerated.  °

## 2020-05-10 LAB — CBC WITH DIFFERENTIAL/PLATELET
Absolute Monocytes: 381 cells/uL (ref 200–950)
Basophils Absolute: 42 cells/uL (ref 0–200)
Basophils Relative: 0.9 %
Eosinophils Absolute: 80 cells/uL (ref 15–500)
Eosinophils Relative: 1.7 %
HCT: 41.7 % (ref 35.0–45.0)
Hemoglobin: 13.9 g/dL (ref 11.7–15.5)
Lymphs Abs: 1156 cells/uL (ref 850–3900)
MCH: 30.2 pg (ref 27.0–33.0)
MCHC: 33.3 g/dL (ref 32.0–36.0)
MCV: 90.7 fL (ref 80.0–100.0)
MPV: 10.4 fL (ref 7.5–12.5)
Monocytes Relative: 8.1 %
Neutro Abs: 3041 cells/uL (ref 1500–7800)
Neutrophils Relative %: 64.7 %
Platelets: 174 10*3/uL (ref 140–400)
RBC: 4.6 10*6/uL (ref 3.80–5.10)
RDW: 13.7 % (ref 11.0–15.0)
Total Lymphocyte: 24.6 %
WBC: 4.7 10*3/uL (ref 3.8–10.8)

## 2020-05-10 LAB — COMPREHENSIVE METABOLIC PANEL
AG Ratio: 2.1 (calc) (ref 1.0–2.5)
ALT: 13 U/L (ref 6–29)
AST: 20 U/L (ref 10–35)
Albumin: 4.2 g/dL (ref 3.6–5.1)
Alkaline phosphatase (APISO): 80 U/L (ref 37–153)
BUN: 16 mg/dL (ref 7–25)
CO2: 26 mmol/L (ref 20–32)
Calcium: 9.1 mg/dL (ref 8.6–10.4)
Chloride: 104 mmol/L (ref 98–110)
Creat: 0.66 mg/dL (ref 0.60–0.93)
Globulin: 2 g/dL (calc) (ref 1.9–3.7)
Glucose, Bld: 95 mg/dL (ref 65–99)
Potassium: 4.3 mmol/L (ref 3.5–5.3)
Sodium: 141 mmol/L (ref 135–146)
Total Bilirubin: 0.4 mg/dL (ref 0.2–1.2)
Total Protein: 6.2 g/dL (ref 6.1–8.1)

## 2020-05-10 LAB — LIPID PANEL
Cholesterol: 168 mg/dL (ref <200)
HDL: 76 mg/dL (ref >=50)
LDL Cholesterol (Calc): 77 mg/dL (calc)
Non-HDL Cholesterol (Calc): 92 mg/dL (calc) (ref <130)
Total CHOL/HDL Ratio: 2.2 (calc) (ref <5.0)
Triglycerides: 70 mg/dL (ref <150)

## 2020-05-10 LAB — INSULIN, RANDOM: Insulin: 3.3 u[IU]/mL

## 2020-05-10 LAB — TSH: TSH: 3.8 mIU/L (ref 0.40–4.50)

## 2020-05-10 LAB — HEMOGLOBIN A1C
Hgb A1c MFr Bld: 5.6 % of total Hgb (ref <5.7)
Mean Plasma Glucose: 114 (calc)
eAG (mmol/L): 6.3 (calc)

## 2020-05-10 LAB — VITAMIN D 25 HYDROXY (VIT D DEFICIENCY, FRACTURES): Vit D, 25-Hydroxy: 42 ng/mL (ref 30–100)

## 2020-05-19 ENCOUNTER — Ambulatory Visit: Payer: 59 | Attending: Internal Medicine

## 2020-05-19 ENCOUNTER — Other Ambulatory Visit (HOSPITAL_BASED_OUTPATIENT_CLINIC_OR_DEPARTMENT_OTHER): Payer: Self-pay | Admitting: Internal Medicine

## 2020-05-19 DIAGNOSIS — Z23 Encounter for immunization: Secondary | ICD-10-CM

## 2020-05-19 NOTE — Progress Notes (Signed)
   Covid-19 Vaccination Clinic  Name:  [name] [name]    MRN: [id number] DOB: [birth year]  05/19/2020  [name] [name] was observed post Covid-19 immunization for 15 minutes without incident. She was provided with Vaccine Information Sheet and instruction to access the V-Safe system.   Vaccinated by Unknown Foley  [name] [name] was instructed to call 911 with any severe reactions post vaccine: Marland Kitchen Difficulty breathing  . Swelling of face and throat  . A fast heartbeat  . A bad rash all over body  . Dizziness and weakness

## 2020-05-25 MED FILL — PFIZER-BIONTECH COVID-19 VA: 30 | 1 days supply | Qty: 0 | Fill #0

## 2020-06-03 ENCOUNTER — Other Ambulatory Visit: Payer: Self-pay | Admitting: Family Medicine

## 2020-06-03 DIAGNOSIS — R7303 Prediabetes: Secondary | ICD-10-CM

## 2020-06-03 DIAGNOSIS — E559 Vitamin D deficiency, unspecified: Secondary | ICD-10-CM

## 2020-06-05 NOTE — Telephone Encounter (Signed)
Vit d last checked 05/09/20 and was 42.  Do you want to continue?

## 2020-06-06 ENCOUNTER — Other Ambulatory Visit: Payer: Self-pay

## 2020-06-06 ENCOUNTER — Telehealth (INDEPENDENT_AMBULATORY_CARE_PROVIDER_SITE_OTHER): Payer: 59 | Admitting: Family Medicine

## 2020-06-06 ENCOUNTER — Encounter: Payer: Self-pay | Admitting: Family Medicine

## 2020-06-06 DIAGNOSIS — E038 Other specified hypothyroidism: Secondary | ICD-10-CM

## 2020-06-06 DIAGNOSIS — F418 Other specified anxiety disorders: Secondary | ICD-10-CM | POA: Diagnosis not present

## 2020-06-06 NOTE — Assessment & Plan Note (Signed)
Pt doing well with zoloft F/u in 6 months or sooner prn

## 2020-06-06 NOTE — Assessment & Plan Note (Signed)
Lab Results  Component Value Date   TSH 3.80 05/09/2020   Stable con't synthroid

## 2020-06-06 NOTE — Progress Notes (Signed)
Virtual Visit via Video Note  I connected with [name] [name] on 06/06/20 at  1:00 PM EST by a video enabled telemedicine application and verified that I am speaking with the correct person using two identifiers.  Location: Patient: home  Provider: office    I discussed the limitations of evaluation and management by telemedicine and the availability of in person appointments. The patient expressed understanding and agreed to proceed.  History of Present Illness:  Pt is home f/u anxiety.  She is doing well with the 150 mg daily  No complaints  Observations/Objective: There were no vitals filed for this visit. Pt is home and feels great!   Pt is in nad  Assessment and Plan: 1. Depression with anxiety Stable con't zoloft   2. Other specified hypothyroidism Lab Results  Component Value Date   TSH 3.80 05/09/2020   con't synthroid    Follow Up Instructions:  F/u 6 months or sooner prn     I discussed the assessment and treatment plan with the patient. The patient was provided an opportunity to ask questions and all were answered. The patient agreed with the plan and demonstrated an understanding of the instructions.   The patient was advised to call back or seek an in-person evaluation if the symptoms worsen or if the condition fails to improve as anticipated.  I provided 25 minutes of non-face-to-face time during this encounter.   [name], DO

## 2020-06-07 ENCOUNTER — Telehealth: Payer: 59 | Admitting: Family Medicine

## 2020-06-13 ENCOUNTER — Encounter: Payer: Self-pay | Admitting: Neurology

## 2020-06-13 ENCOUNTER — Ambulatory Visit: Payer: 59 | Admitting: Neurology

## 2020-06-13 VITALS — BP 135/80 | HR 67 | Resp 20 | Ht 60.5 in | Wt 243.0 lb

## 2020-06-13 DIAGNOSIS — G4721 Circadian rhythm sleep disorder, delayed sleep phase type: Secondary | ICD-10-CM

## 2020-06-13 DIAGNOSIS — G3184 Mild cognitive impairment, so stated: Secondary | ICD-10-CM | POA: Diagnosis not present

## 2020-06-13 DIAGNOSIS — Z6841 Body Mass Index (BMI) 40.0 and over, adult: Secondary | ICD-10-CM

## 2020-06-13 DIAGNOSIS — R058 Other specified cough: Secondary | ICD-10-CM

## 2020-06-13 NOTE — Patient Instructions (Addendum)
Quality Sleep Information, Adult Quality sleep is important for your mental and physical health. It also improves your quality of life. Quality sleep means you:  Are asleep for most of the time you are in bed.  Fall asleep within 30 minutes.  Wake up no more than once a night.  Are awake for no longer than 20 minutes if you do wake up during the night. Most adults need 7-8 hours of quality sleep each night. How can poor sleep affect me? If you do not get enough quality sleep, you may have:  Mood swings.  Daytime sleepiness.  Confusion.  Decreased reaction time.  Sleep disorders, such as insomnia and sleep apnea.  Difficulty with: ? Solving problems. ? Coping with stress. ? Paying attention. These issues may affect your performance and productivity at work, school, and at home. Lack of sleep may also put you at higher risk for accidents, suicide, and risky behaviors. If you do not get quality sleep you may also be at higher risk for several health problems, including:  Infections.  Type 2 diabetes.  Heart disease.  High blood pressure.  Obesity.  Worsening of long-term conditions, like arthritis, kidney disease, depression, Parkinson's disease, and epilepsy. What actions can I take to get more quality sleep?      Stick to a sleep schedule. Go to sleep and wake up at about the same time each day. Do not try to sleep less on weekdays and make up for lost sleep on weekends. This does not work.  Try to get about 30 minutes of exercise on most days. Do not exercise 2-3 hours before going to bed.  Limit naps during the day to 30 minutes or less.  Do not use any products that contain nicotine or tobacco, such as cigarettes or e-cigarettes. If you need help quitting, ask your health care provider.  Do not drink caffeinated beverages for at least 8 hours before going to bed. Coffee, tea, and some sodas contain caffeine.  Do not drink alcohol close to bedtime.  Do not  eat large meals close to bedtime.  Do not take naps in the late afternoon.  Try to get at least 30 minutes of sunlight every day. Morning sunlight is best.  Make time to relax before bed. Reading, listening to music, or taking a hot bath promotes quality sleep.  Make your bedroom a place that promotes quality sleep. Keep your bedroom dark, quiet, and at a comfortable room temperature. Make sure your bed is comfortable. Take out sleep distractions like TV, a computer, smartphone, and bright lights.  If you are lying awake in bed for longer than 20 minutes, get up and do a relaxing activity until you feel sleepy.  Work with your health care provider to treat medical conditions that may affect sleeping, such as: ? Nasal obstruction. ? Snoring. ? Sleep apnea and other sleep disorders.  Talk to your health care provider if you think any of your prescription medicines may cause you to have difficulty falling or staying asleep.  If you have sleep problems, talk with a sleep consultant. If you think you have a sleep disorder, talk with your health care provider about getting evaluated by a specialist. Where to find more information  National Sleep Foundation website: https://sleepfoundation.org  National Heart, Lung, and Blood Institute (NHLBI): www.nhlbi.nih.gov/files/docs/public/sleep/healthy_sleep.pdf  Centers for Disease Control and Prevention (CDC): www.cdc.gov/sleep/index.html Contact a health care provider if you:  Have trouble getting to sleep or staying asleep.  Often wake up   very early in the morning and cannot get back to sleep.  Have daytime sleepiness.  Have daytime sleep attacks of suddenly falling asleep and sudden muscle weakness (narcolepsy).  Have a tingling sensation in your legs with a strong urge to move your legs (restless legs syndrome).  Stop breathing briefly during sleep (sleep apnea).  Think you have a sleep disorder or are taking a medicine that is  affecting your quality of sleep. Summary  Most adults need 7-8 hours of quality sleep each night.  Getting enough quality sleep is an important part of health and well-being.  Make your bedroom a place that promotes quality sleep and avoid things that may cause you to have poor sleep, such as alcohol, caffeine, smoking, and large meals.  Talk to your health care provider if you have trouble falling asleep or staying asleep. This information is not intended to replace advice given to you by your health care provider. Make sure you discuss any questions you have with your health care provider. Document Revised: 10/08/2017 Document Reviewed: 10/08/2017 Elsevier Patient Education  2020 [employer] Screening for Sleep Apnea  Sleep apnea is a condition in which breathing pauses or becomes shallow during sleep. Sleep apnea screening is a test to determine if you are at risk for sleep apnea. The test is easy and only takes a few minutes. Your health care provider may ask you to have this test in preparation for surgery or as part of a physical exam. What are the symptoms of sleep apnea? Common symptoms of sleep apnea include:  Snoring.  Restless sleep.  Daytime sleepiness.  Pauses in breathing.  Choking during sleep.  Irritability.  Forgetfulness.  Trouble thinking clearly.  Depression.  Personality changes. Most people with sleep apnea are not aware that they have it. Why should I get screened? Getting screened for sleep apnea can help:  Ensure your safety. It is important for your health care providers to know whether or not you have sleep apnea, especially if you are having surgery or have other long-term (chronic) health conditions.  Improve your health and allow you to get a better night's rest. Restful sleep can help you: ? Have more energy. ? Lose weight. ? Improve high blood pressure. ? Improve diabetes management. ? Prevent stroke. ? Prevent car accidents. How  is screening done? Screening usually includes being asked a list of questions about your sleep quality. Some questions you may be asked include:  Do you snore?  Is your sleep restless?  Do you have daytime sleepiness?  Has a partner or spouse told you that you stop breathing during sleep?  Have you had trouble concentrating or memory loss? If your screening test is positive, you are at risk for the condition. Further testing may be needed to confirm a diagnosis of sleep apnea. Where to find more information You can find screening tools online or at your health care clinic. For more information about sleep apnea screening and healthy sleep, visit these websites:  Centers for Disease Control and Prevention: www.cdc.gov/sleep/index.html  American Sleep Apnea Association: www.sleepapnea.org Contact a health care provider if:  You think that you may have sleep apnea. Summary  Sleep apnea screening can help determine if you are at risk for sleep apnea.  It is important for your health care providers to know whether or not you have sleep apnea, especially if you are having surgery or have other chronic health conditions.  You may be asked to take a screening test for   sleep apnea in preparation for surgery or as part of a physical exam. This information is not intended to replace advice given to you by your health care provider. Make sure you discuss any questions you have with your health care provider. Document Revised: 04/17/2018 Document Reviewed: 10/11/2016 Elsevier Patient Education  2020 [employer] Inc.  

## 2020-06-13 NOTE — Progress Notes (Signed)
[facility]    SLEEP MEDICINE CLINIC    Provider:  Larey Seat, MD  Primary Care Physician:  [name], DO [address] [place] [address]     Referring Provider: Claudette Laws [address],  [place] [address]   Her dentist, Dr.Douglas Kellog, DDS @ [facility].          Chief Complaint according to patient   Patient presents with:    . New Patient (Initial Visit)           HISTORY OF PRESENT ILLNESS:  [name] [name] is a 74 year- old Caucasian female patient was seen here upon a dentist based referral on 06/13/2020 .  Chief concern according to patient : "I don't think I snore and my husband says I don't."   I have the pleasure of seeing [name] [name] today, a right-handed  Caucasian female of [place] with a possible sleep disorder.   She  has a past medical history of Allergic rhinitis, Arthritis, Cataract, Depression, Diabetes mellitus without complication ([place]), Emphysema lung ([place]), Endometrial polyp, Hyperlipidemia, Hyperlipidemia, Hypothyroidism, IBS (irritable bowel syndrome), Obesity, Osteoarthritis, and Seasonal allergies.   Sleep relevant medical history: Concussion one time. No ENT surgery, had a parotis gland tumor.     Family medical /sleep history: No other family member on CPAP with OSA, insomnia, sleep walkers.    Social history:  Patient is retired from [employer],  and lives in a household with her spouse, they have no children. .husband has CHF, they live self isolated, both are fully vaccinated.  Pets are present. 2 cats.  Tobacco use- 40 years, quit in 2/ 2014.   ETOH use - 3-4 a year, Caffeine intake in form of Coffee( 1) Soda( /) Tea ( one in Am and one in PM ) or energy drinks. Regular exercise in form of restricted walking - uses a cane, has knee pain. She lost  50 pounds in prep for a knee surgery in 09/2018. Since then she has gained it all back , the knee surgery neve took place.  Dr Roxine Caddy. Hobbies : reading, quilting, painting.     Sleep habits are as follows: The patient's dinner time is between 7 PM. The patient has a snack by midnight , works/ plays on hr computer-  finally goes to bed at 4 AM and continues to sleep for 7-9 hours, wakes at 1-2 PM.  The preferred sleep position is prone or sideways , with the support of 2 pillows.  Dreams are reportedly infrequent..  The patient wakes up spontaneously.   She reports not feeling refreshed or restored in AM, with symptoms such as dry mouth,  and residual fatigue. Naps are taken infrequently.  Review of Systems: Out of a complete 14 system review, the patient complains of only the following symptoms, and all other reviewed systems are negative.:  Fatigue, sleepiness , snoring, fragmented sleep, Insomnia - delayed sleep phase - she slipped into this during the pandemic.   Knee pain.    How likely are you to doze in the following situations: 0 = not likely, 1 = slight chance, 2 = moderate chance, 3 = high chance   Sitting and Reading? Watching Television? Sitting inactive in a public place (theater or meeting)? As a passenger in a car for an hour without a break? Lying down in the afternoon when circumstances permit? Sitting and talking to someone?  Sitting quietly after lunch without alcohol? In a car, while stopped for a few minutes in traffic?   Total = 6/ 24 points   FSS endorsed at  39/ 63 points.   GDS; 4/ 15   Social History   Socioeconomic History  . Marital status: Married    Spouse name: Francessca Friis  . Number of children: 0  . Years of education: Not on file  . Highest education level: Master's degree (e.g., MA, MS, MEng, MEd, MSW, MBA)  Occupational History  . Occupation: Daycare p/t    Comment: retired  Tobacco Use  . Smoking status: Former Smoker    Packs/day: 2.00    Years: 42.00    Pack years: 84.00    Types: Cigarettes    Quit date: 07/28/2012    Years since quitting: 7.8  .  Smokeless tobacco: Never Used  Vaping Use  . Vaping Use: Former  Substance and Sexual Activity  . Alcohol use: Yes    Comment: rare  . Drug use: Never  . Sexual activity: Yes    Partners: Male    Birth control/protection: Post-menopausal  Other Topics Concern  . Not on file  Social History Narrative   Regular exercise- no    Lives with spouse   Caffeine- 2 teas daily   Social Determinants of Health   Financial Resource Strain:   . Difficulty of Paying Living Expenses: Not on file  Food Insecurity:   . Worried About Charity fundraiser in the Last Year: Not on file  . Ran Out of Food in the Last Year: Not on file  Transportation Needs:   . Lack of Transportation (Medical): Not on file  . Lack of Transportation (Non-Medical): Not on file  Physical Activity:   . Days of Exercise per Week: Not on file  . Minutes of Exercise per Session: Not on file  Stress:   . Feeling of Stress : Not on file  Social Connections:   . Frequency of Communication with Friends and Family: Not on file  . Frequency of Social Gatherings with Friends and Family: Not on file  . Attends Religious Services: Not on file  . Active Member of Clubs or Organizations: Not on file  . Attends Archivist Meetings: Not on file  . Marital Status: Not on file    Family History  Problem Relation Age of Onset  . Colon cancer Father 53  . Cancer Father 13       colon  . Alcoholism Father   . Pancreatic cancer Mother 4  . Cancer Mother 94       pancreatic  . Colon cancer Paternal Grandmother   . Cancer Paternal Grandmother 20       colon  . Heart attack Maternal Grandmother 50  . Heart disease Maternal Grandmother   . Cancer Sister 58       melanoma  . Breast cancer Sister   . Cancer Brother 34       skin  . Thyroid disease Sister   . Breast cancer Maternal Aunt 30  . Heart attack Maternal Aunt 60  . Breast cancer Sister     Past Medical History:  Diagnosis Date  . Allergic rhinitis     . Arthritis   . Cataract   . Depression   . Diabetes mellitus without complication ([place])   . Emphysema lung ([facility])    Early stage  . Endometrial polyp   . Hyperlipidemia   . Hyperlipidemia  on simvastatin  . Hypothyroidism   . IBS (irritable bowel syndrome)   . Obesity   . Osteoarthritis   . Seasonal allergies     Past Surgical History:  Procedure Laterality Date  . BUNIONECTOMY  2009   Dr.Duda, left  . EYE SURGERY    . HYSTEROSCOPY WITH D & C  04/24/2011   Procedure: DILATATION AND CURETTAGE (D&C) /HYSTEROSCOPY;  Surgeon: Eldred Manges, MD;  Location: [facility];  Service: Gynecology;  Laterality: N/A;  . 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 PLANTAR FASCIA SURGERY  1994   Dr Percell Miller  . POLYPECTOMY  04/24/2011   Procedure: POLYPECTOMY;  Surgeon: Eldred Manges, MD;  Location: [facility];  Service: Gynecology;  Laterality: N/A;     Current Outpatient Medications on File Prior to Visit  Medication Sig Dispense Refill  . levothyroxine (SYNTHROID) 125 MCG tablet Take 1 tablet (125 mcg total) by mouth daily. 90 tablet 3  . loperamide (IMODIUM) 2 MG capsule Take 1 capsule (2 mg total) by mouth 2 (two) times daily. 180 capsule 1  . metFORMIN (GLUCOPHAGE) 500 MG tablet TAKE 1 TABLET BY MOUTH  DAILY WITH BREAKFAST 90 tablet 3  . multivitamin (THERAGRAN) per tablet Take 1 tablet by mouth daily.      . sertraline (ZOLOFT) 50 MG tablet 3 po qd 270 tablet 3  . simvastatin (ZOCOR) 20 MG tablet Take 1 tablet (20 mg total) by mouth at bedtime. 90 tablet 1  . Vitamin D, Ergocalciferol, (DRISDOL) 1.25 MG (50000 UNIT) CAPS capsule TAKE 1 CAPSULE BY MOUTH  EVERY 7 DAYS 4 capsule 11  . diclofenac sodium (VOLTAREN) 1 % GEL APPLY 4 GRAMS TOPICALLY TO  SKIN 4 TIMES DAILY. (Patient not taking: Reported on 06/13/2020) 400 g 1   No current facility-administered medications on file prior to visit.    No Known Allergies  Physical exam:  Today's Vitals   06/13/20 1127  BP: 135/80   Pulse: 67  Resp: 20  Weight: 243 lb (110.2 kg)  Height: 5' 0.5" (1.537 m)   Body mass index is 46.68 kg/m.   Wt Readings from Last 3 Encounters:  06/13/20 243 lb (110.2 kg)  05/09/20 248 lb 6.4 oz (112.7 kg)  03/06/20 243 lb 3.2 oz (110.3 kg)     Ht Readings from Last 3 Encounters:  06/13/20 5' 0.5" (1.537 m)  05/09/20 5' (1.524 m)  03/06/20 5' (1.524 m)      General: The patient is awake, alert and appears not in acute distress. The patient is well groomed. Head: Normocephalic, atraumatic. Neck is supple. Mallampati 2,  neck circumference: 16 inches . Nasal airflow patent.  Retrognathia is mildly seen. Bite guard user , now on invisaline.  Dental status: Bite guard user , now on invisaline.  Cardiovascular:  Regular rate and cardiac rhythm by pulse,  without distended neck veins. Respiratory: Lungs are clear to auscultation.  Skin:  Without evidence of ankle edema, or rash. Trunk: The patient's posture is erect.   Neurologic exam : The patient is awake and alert, oriented to place and time.   Memory subjective described as intact.  Attention span & concentration ability appears normal.  Speech is fluent,  without  dysarthria, dysphonia or aphasia.  Mood and affect are appropriate.   Cranial nerves: no loss of smell or taste reported  Pupils are equal and briskly reactive to light. Funduscopic exam deferred. .  Extraocular movements in vertical and horizontal planes  were intact and without nystagmus. No Diplopia. Visual fields by finger perimetry are intact. Hearing was intact to soft voice and finger rubbing. Facial sensation intact to fine touch. Facial motor strength is symmetric and tongue and uvula move midline.  Neck ROM : rotation, tilt and flexion extension were normal for age and shoulder shrug was symmetrical.    Motor exam:  Symmetric bulk, tone and ROM.   Normal tone without cog-wheeling, symmetric grip strength . Normal hip felxion , adduction and abduction,  no foot drop.  Equal thenar eminence.   Sensory:  Fine touch and vibration were normal.  Proprioception tested in the upper extremities was normal. Coordination: Rapid alternating movements in the fingers/hands were of normal speed.  The Finger-to-nose maneuver was intact without evidence of ataxia, dysmetria or tremor. Gait and station: Patient could rise unassisted from a seated position, walked with a cane.  Toe and heel walk were deferred.  Deep tendon reflexes: in the  upper and lower extremities are symmetric, and brisk (!)- intact.  Babinski response was deferred ,     After spending a total time of 45 minutes face to face and additional time for physical and neurologic examination, review of laboratory studies,  personal review of imaging studies, reports and results of other testing and review of referral information / records as far as provided in visit, I have established the following assessments:  1) Small upper airway- dysphonia and dysphagia- choking on saliva. Can certainly have apnea.  Obesity is another risk factor.  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  3) chronic knee pain, expecting surgery. Check for apnea before surgery.  4) she reports difficulties finding words.    My Plan is to proceed with:  1) attended sleep study or HST, HST will likely help to accommodate her sleep phase.  2) next visit with [facility]. [facility]OCA.  3) return to Dr. Leafy Ro- weight and wellness.   I would like to thank Carollee Herter, Alferd Apa, DO and [place], [address],  [place] [address] for allowing me to meet with and to take care of this pleasant patient.   In short, [name] [name] will follow up after her sleep test in 3-4 month - she hopes for a dental device rather than CPAP- CC: I will share my notes with her dentist.   Electronically signed by: Larey Seat, MD 06/13/2020 11:47 AM  [facility]  and [facility] Sleep Board certified by The [employer] of Sleep Medicine and Diplomate of the [employer] of Sleep Medicine. Board certified In Neurology through the [place], Fellow of the [employer] of Neurology. Medical Director of [employer].

## 2020-07-11 ENCOUNTER — Ambulatory Visit (INDEPENDENT_AMBULATORY_CARE_PROVIDER_SITE_OTHER): Payer: 59 | Admitting: Neurology

## 2020-07-11 DIAGNOSIS — G4733 Obstructive sleep apnea (adult) (pediatric): Secondary | ICD-10-CM | POA: Diagnosis not present

## 2020-07-11 DIAGNOSIS — R058 Other specified cough: Secondary | ICD-10-CM

## 2020-07-11 DIAGNOSIS — Z6841 Body Mass Index (BMI) 40.0 and over, adult: Secondary | ICD-10-CM

## 2020-07-11 DIAGNOSIS — G3184 Mild cognitive impairment, so stated: Secondary | ICD-10-CM

## 2020-07-11 DIAGNOSIS — G4721 Circadian rhythm sleep disorder, delayed sleep phase type: Secondary | ICD-10-CM

## 2020-07-19 NOTE — Progress Notes (Signed)
   [facility]  HOME SLEEP TEST (Watch PAT)  STUDY DATE: 07/11/20  DOB: Nov 19, 1945  MRN: [id number]  ORDERING CLINICIAN: Melvyn Novas, MD   REFERRING CLINICIAN: Dr. Carleene Mains, DDS  CLINICAL INFORMATION/HISTORY: [name] [name] today isa right-handed Caucasian female of [place] descent and presents for work up of a possible sleep disorder, referred by her dentist, on 11-302-21. Her dentist is Dr. Carleene Mains, DDS. Her husband has noted her to snore.  She has a past medical history of Allergic rhinitis, Arthritis, Cataract, Depression, Diabetes mellitus without complication ([facility]), Emphysema ([facility]),  Hyperlipidemia, Hyperlipidemia, Hypothyroidism, IBS (irritable bowel syndrome), Morbid Obesity grade 3, Osteoarthritis, and Seasonal allergies.  Epworth sleepiness score: 6/24.  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    RECOMMENDATION:  REM sleep dependent apnea requires positive airway pressure to allow ventilation of deeper pulmonary structures. This form of apnea is more often seen in obese patients with obesity hypoventilation.  Snoring will be treated by CPAP or BiPAP as well.   I recommend to use an autotitration PAP device with settings from 5 through 16 cm water, 2 cm EPR, and mask of patient's choice and comfort with heated humidification.   PS : DME Wynelle Link advise the  patient of supply chain delays in obtaining a CPAP.   INTERPRETING PHYSICIAN:  Melvyn Novas, MD  [facility] and [facility] Sleep Board certified in Sleep Medicine and Diplomate of the [employer] of Sleep Medicine. Board certified In Neurology through the [employer], Fellow of the [employer] of Neurology. Medical Director of [employer].

## 2020-07-24 DIAGNOSIS — G4733 Obstructive sleep apnea (adult) (pediatric): Secondary | ICD-10-CM | POA: Insufficient documentation

## 2020-07-24 NOTE — Progress Notes (Signed)
Cc Dr Lenon Oms, Dentist on [address].  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    RECOMMENDATION:  REM sleep dependent apnea requires positive airway pressure to allow ventilation of deeper pulmonary structures. This form of apnea is more often seen in obese patients with obesity hypoventilation.  Snoring will be treated by CPAP or BiPAP as well.   I recommend to use an autotitration PAP device with settings from 5 through 16 cm water, 2 cm EPR, and mask of patient's choice and comfort with heated humidification.   PS : DME Derrek Monaco advise the patient of supply chain delays in obtaining a CPAP.

## 2020-07-24 NOTE — Procedures (Signed)
[facility] TEST (Watch PAT)  STUDY DATE: 07/11/20  DOB: [birth year]  MRN: [id number]  ORDERING CLINICIAN: Larey Seat, MD   REFERRING CLINICIAN: Dr. Hetty Ely, DDS  CLINICAL INFORMATION/HISTORY: [name] [name] today isa right-handed Caucasian female of [place] descent and presents for work up of a possible sleep disorder, referred by her dentist, on 11-302-21. Her dentist is Dr. Hetty Ely, DDS. Her husband has noted her to snore. She remarked upon memory concerns, Insomnia problems, fragmented sleep.  She has a past medical history of Allergic rhinitis, Arthritis, Cataract, Depression, Diabetes mellitus without complication ([place]), Emphysema ([place]),  Hyperlipidemia, Hyperlipidemia, Hypothyroidism, IBS (irritable bowel syndrome), Morbid Obesity grade 3, Osteoarthritis, and Seasonal allergies.  Epworth sleepiness score: 6/24.  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    RECOMMENDATION:  REM sleep dependent apnea requires positive airway pressure to allow ventilation of deeper pulmonary structures. This form of apnea is more often seen in obese patients with obesity hypoventilation.  Snoring will be treated by CPAP or BiPAP as well.   I recommend to use an autotitration PAP device with settings from 5 through 16 cm water, 2 cm EPR, and mask of patient's choice and  comfort with heated humidification.   PS : DME Derrek Monaco advise the patient of supply chain delays in obtaining a CPAP.   INTERPRETING PHYSICIAN:  Larey Seat, MD  [facility] and [facility] Sleep Board certified in Sleep Medicine and [place] of the [employer] of Sleep Medicine. Board certified In Neurology through the [place], Fellow of the [employer] of Neurology. Medical Director of [employer].

## 2020-07-24 NOTE — Addendum Note (Signed)
Addended by: Larey Seat on: 07/24/2020 04:39 PM   Modules accepted: Orders

## 2020-07-26 ENCOUNTER — Telehealth: Payer: Self-pay

## 2020-07-26 NOTE — Telephone Encounter (Signed)
I called patient.  I discussed her sleep study results and recommendations.  I discussed starting an AutoPap.  Patient would like to think about this further before deciding.  I discussed the risks and ramifications of untreated sleep apnea with patient.  I offered her an appointment with Dr. Brett Fairy to discuss further bu she declined at this time.  Patient will call us back with her decision.  Patient verbalized understanding of results.  Patient had no further questions or concerns.

## 2020-07-26 NOTE — Telephone Encounter (Signed)
-----   Message from Larey Seat, MD sent at 07/24/2020  4:39 PM EST ----- Cc Dr Lenon Oms, Dentist on [address].  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    RECOMMENDATION:  REM sleep dependent apnea requires positive airway pressure to allow ventilation of deeper pulmonary structures. This form of apnea is more often seen in obese patients with obesity hypoventilation.  Snoring will be treated by CPAP or BiPAP as well.   I recommend to use an autotitration PAP device with settings from 5 through 16 cm water, 2 cm EPR, and mask of patient's choice and comfort with heated humidification.   PS : DME Derrek Monaco advise the patient of supply chain delays in obtaining a CPAP.

## 2020-09-24 ENCOUNTER — Other Ambulatory Visit: Payer: Self-pay | Admitting: Family Medicine

## 2020-09-24 DIAGNOSIS — E785 Hyperlipidemia, unspecified: Secondary | ICD-10-CM

## 2020-12-20 ENCOUNTER — Other Ambulatory Visit: Payer: Self-pay | Admitting: Family Medicine

## 2020-12-20 DIAGNOSIS — E785 Hyperlipidemia, unspecified: Secondary | ICD-10-CM

## 2021-01-05 ENCOUNTER — Other Ambulatory Visit: Payer: Self-pay | Admitting: Family Medicine

## 2021-01-05 DIAGNOSIS — E039 Hypothyroidism, unspecified: Secondary | ICD-10-CM

## 2021-02-03 ENCOUNTER — Other Ambulatory Visit: Payer: Self-pay | Admitting: Family Medicine

## 2021-02-03 DIAGNOSIS — E785 Hyperlipidemia, unspecified: Secondary | ICD-10-CM

## 2021-02-18 ENCOUNTER — Other Ambulatory Visit: Payer: Self-pay | Admitting: Family Medicine

## 2021-02-18 DIAGNOSIS — E039 Hypothyroidism, unspecified: Secondary | ICD-10-CM

## 2021-02-23 ENCOUNTER — Other Ambulatory Visit (HOSPITAL_BASED_OUTPATIENT_CLINIC_OR_DEPARTMENT_OTHER): Payer: Self-pay | Admitting: Family Medicine

## 2021-02-23 ENCOUNTER — Other Ambulatory Visit: Payer: Self-pay

## 2021-02-23 ENCOUNTER — Ambulatory Visit (HOSPITAL_BASED_OUTPATIENT_CLINIC_OR_DEPARTMENT_OTHER)
Admission: RE | Admit: 2021-02-23 | Discharge: 2021-02-23 | Disposition: A | Discharge status: Home / Self Care | Payer: 59 | Source: Ambulatory Visit | Attending: Acute Care | Admitting: Acute Care

## 2021-02-23 DIAGNOSIS — Z87891 Personal history of nicotine dependence: Secondary | ICD-10-CM | POA: Insufficient documentation

## 2021-02-23 DIAGNOSIS — Z1231 Encounter for screening mammogram for malignant neoplasm of breast: Secondary | ICD-10-CM

## 2021-02-26 ENCOUNTER — Other Ambulatory Visit: Payer: Self-pay | Admitting: Family Medicine

## 2021-02-26 DIAGNOSIS — E559 Vitamin D deficiency, unspecified: Secondary | ICD-10-CM

## 2021-02-27 NOTE — Telephone Encounter (Signed)
Patient is requesting a refill of the following medications: Requested Prescriptions   Pending Prescriptions Disp Refills   Vitamin D, Ergocalciferol, (DRISDOL) 1.25 MG (50000 UNIT) CAPS capsule [Pharmacy Med Name: Vitamin D (Ergocalciferol) 1.25 MG (50000 UT) Oral Capsule] 13 capsule 3    Sig: TAKE 1 CAPSULE BY MOUTH  EVERY 7 DAYS    Date of patient request: 02/27/21 Last office visit: 06/06/20 Date of last refill: 06/05/20 Last refill amount: 4 + 11 Follow up time period per chart: 03/08/21  Is pt still supposed to be on the once weekly Vit D or switch to the OTC daily?

## 2021-03-02 NOTE — Progress Notes (Signed)
Please call patient and let them  know their  low dose Ct was read as a Lung RADS 2: nodules that are benign in appearance and behavior with a very low likelihood of becoming a clinically active cancer due to size or lack of growth. Recommendation per radiology is for a repeat LDCT in 12 months. .Please let them  know we will order and schedule their  annual screening scan for 02/2022. Please let them  know there was notation of CAD on their  scan.  Please remind the patient  that this is a non-gated exam therefore degree or severity of disease  cannot be determined. Please have them  follow up with their PCP regarding potential risk factor modification, dietary therapy or pharmacologic therapy if clinically indicated. Pt.  is  currently on statin therapy. Please place order for annual  screening scan for  02/2022 and fax results to PCP. Thanks so much.  + Atherosclerotic nonaneurysmal thoracic aorta She is on a statin  No cardiology notes in Epic, have her follow up with PCP. Thanks so much

## 2021-03-05 ENCOUNTER — Encounter: Payer: Self-pay | Admitting: *Deleted

## 2021-03-05 DIAGNOSIS — Z87891 Personal history of nicotine dependence: Secondary | ICD-10-CM

## 2021-03-07 ENCOUNTER — Other Ambulatory Visit: Payer: Self-pay

## 2021-03-08 ENCOUNTER — Encounter: Payer: Self-pay | Admitting: Family Medicine

## 2021-03-08 ENCOUNTER — Ambulatory Visit (INDEPENDENT_AMBULATORY_CARE_PROVIDER_SITE_OTHER): Payer: 59 | Admitting: Family Medicine

## 2021-03-08 VITALS — BP 140/86 | HR 80 | Temp 98.7°F | Resp 18 | Ht 60.5 in | Wt 232.0 lb

## 2021-03-08 DIAGNOSIS — F418 Other specified anxiety disorders: Secondary | ICD-10-CM | POA: Diagnosis not present

## 2021-03-08 DIAGNOSIS — Z0000 Encounter for general adult medical examination without abnormal findings: Secondary | ICD-10-CM

## 2021-03-08 DIAGNOSIS — M170 Bilateral primary osteoarthritis of knee: Secondary | ICD-10-CM

## 2021-03-08 DIAGNOSIS — E785 Hyperlipidemia, unspecified: Secondary | ICD-10-CM | POA: Diagnosis not present

## 2021-03-08 DIAGNOSIS — Z23 Encounter for immunization: Secondary | ICD-10-CM

## 2021-03-08 DIAGNOSIS — E039 Hypothyroidism, unspecified: Secondary | ICD-10-CM

## 2021-03-08 DIAGNOSIS — I10 Essential (primary) hypertension: Secondary | ICD-10-CM

## 2021-03-08 MED ORDER — SERTRALINE HCL 100 MG PO TABS
100.0000 mg | ORAL_TABLET | Freq: Every day | ORAL | 3 refills | Status: DC
Start: 2021-03-08 — End: 2021-07-04

## 2021-03-08 NOTE — Assessment & Plan Note (Signed)
Well controlled, no changes to meds. Encouraged heart healthy diet such as the DASH diet and exercise as tolerated.  °

## 2021-03-08 NOTE — Assessment & Plan Note (Signed)
Check labs  See avs ghm utd

## 2021-03-08 NOTE — Progress Notes (Signed)
Subjective:   By signing my name below, I, Shehryar Baig, attest that this documentation has been prepared under the direction and in the presence of Dr. Roma Schanz, DO. 03/08/2021    Patient ID: [name] [name], female    DOB: 1945/10/02, 75 y.o.   MRN: [id number]  Chief Complaint  Patient presents with   Annual Exam    Pt states fasting     HPI Patient is in today for a comprehensive physical exam.  She reports that her depression symptoms have improved since her last visit. She started taking 50 mg Zoloft 2x daily PO and notes having improvement. She is requesting to switched her dosage to 100 mg.  She is requesting a refill for 20 mg simvastatin daily PO. She is requesting in to increase the dosage and is willing to wait and until her next lab work before making the decision.  She applies Voltaren gel and uses OTC Advil or tylenol to manage her knee pain. She is seeing a orthopedist specialist to manage her pain. She denies having any fever, ear pain, congestion, sinus pain, sore throat, eye pain, chest pain, palpations, cough, SOB, wheezing, n/v/d, constipation, blood in stool, dysuria, frequency, hematuria, or headaches at this time. She does not participate in regular exercise at this time.  She has 3 pfizer Covid-19 vaccines at this time. She is due for pneumonia vaccines. She is eligible for the shingrix vaccine and is willing to get it at a later date with her husband. She is willing to get the flu vaccine at a later date with her husband.  She was seeing a healthy weight and wellness program before the Covid-19 pandemic and stopped after the lockdown. She is interested in seeing them again to assist with her weight loss.   Past Medical History:  Diagnosis Date   Allergic rhinitis    Arthritis    Cataract    Depression    Diabetes mellitus without complication ([place])    Emphysema lung ([place])    Early stage   Endometrial polyp    Hyperlipidemia    Hyperlipidemia     on simvastatin   Hypothyroidism    IBS (irritable bowel syndrome)    Obesity    Osteoarthritis    Seasonal allergies     Past Surgical History:  Procedure Laterality Date   BUNIONECTOMY  2009   Dr.Duda, left   EYE SURGERY     HYSTEROSCOPY WITH D & C  04/24/2011   Procedure: DILATATION AND CURETTAGE (D&C) /HYSTEROSCOPY;  Surgeon: Eldred Manges, MD;  Location: [facility];  Service: Gynecology;  Laterality: N/A;   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   Dr Chrys Racer FASCIA SURGERY  1994   Dr Percell Miller   POLYPECTOMY  04/24/2011   Procedure: POLYPECTOMY;  Surgeon: Eldred Manges, MD;  Location: [facility];  Service: Gynecology;  Laterality: N/A;    Family History  Problem Relation Age of Onset   Colon cancer Father 9   Cancer Father 26       colon   Alcoholism Father    Pancreatic cancer Mother 34   Cancer Mother 38       pancreatic   Colon cancer Paternal Grandmother    Cancer Paternal Grandmother 27       colon   Heart attack Maternal Grandmother 72   Heart disease Maternal Grandmother    Cancer Sister 45       melanoma   Breast cancer Sister  Cancer Brother 68       skin   Thyroid disease Sister    Breast cancer Maternal Aunt 30   Heart attack Maternal Aunt 60   Breast cancer Sister     Social History   Socioeconomic History   Marital status: Married    Spouse name: Maredith Slavick   Number of children: 0   Years of education: Not on file   Highest education level: Master's degree (e.g., MA, MS, MEng, MEd, MSW, MBA)  Occupational History   Occupation: Daycare p/t    Comment: retired  Tobacco Use   Smoking status: Former    Packs/day: 2.00    Years: 42.00    Pack years: 84.00    Types: Cigarettes    Quit date: 07/28/2012    Years since quitting: 8.6   Smokeless tobacco: Never  Vaping Use   Vaping Use: Former  Substance and Sexual Activity   Alcohol use: Yes    Comment: rare   Drug use: Never   Sexual activity: Yes    Partners: Male    Birth  control/protection: Post-menopausal  Other Topics Concern   Not on file  Social History Narrative   Regular exercise- no    Lives with spouse   Caffeine- 2 teas daily   Social Determinants of Health   Financial Resource Strain: Not on file  Food Insecurity: Not on file  Transportation Needs: Not on file  Physical Activity: Not on file  Stress: Not on file  Social Connections: Not on file  Intimate Partner Violence: Not on file    Outpatient Medications Prior to Visit  Medication Sig Dispense Refill   levothyroxine (SYNTHROID) 125 MCG tablet TAKE 1 TABLET BY MOUTH  DAILY 30 tablet 1   loperamide (IMODIUM) 2 MG capsule Take 1 capsule (2 mg total) by mouth 2 (two) times daily. 180 capsule 1   metFORMIN (GLUCOPHAGE) 500 MG tablet TAKE 1 TABLET BY MOUTH  DAILY WITH BREAKFAST 90 tablet 3   multivitamin (THERAGRAN) per tablet Take 1 tablet by mouth daily.       simvastatin (ZOCOR) 20 MG tablet TAKE 1 TABLET BY MOUTH AT  BEDTIME (NEED OFFICE VISIT  FOR FURTHER REFILLS) 30 tablet 11   Vitamin D, Ergocalciferol, (DRISDOL) 1.25 MG (50000 UNIT) CAPS capsule TAKE 1 CAPSULE BY MOUTH  EVERY 7 DAYS 13 capsule 3   sertraline (ZOLOFT) 50 MG tablet 3 po qd 270 tablet 3   COVID-19 mRNA vaccine, Pfizer, 30 MCG/0.3ML injection INJECT AS DIRECTED (Patient not taking: Reported on 03/08/2021) .3 mL 0   diclofenac sodium (VOLTAREN) 1 % GEL APPLY 4 GRAMS TOPICALLY TO  SKIN 4 TIMES DAILY. (Patient not taking: No sig reported) 400 g 1   No facility-administered medications prior to visit.    No Known Allergies  Review of Systems  Constitutional:  Negative for fever.  HENT:  Negative for congestion, ear pain, sinus pain and sore throat.   Eyes:  Negative for pain.  Respiratory:  Negative for cough, shortness of breath and wheezing.   Cardiovascular:  Negative for chest pain and palpitations.  Gastrointestinal:  Negative for blood in stool, constipation, diarrhea, nausea and vomiting.  Genitourinary:   Negative for dysuria, frequency and hematuria.  Musculoskeletal:  Positive for joint pain (bilateral knee, R>L).  Neurological:  Negative for headaches.  Psychiatric/Behavioral:  Negative for depression. The patient is not nervous/anxious.       Objective:    Physical Exam Constitutional:  General: She is not in acute distress.    Appearance: Normal appearance. She is not ill-appearing.  HENT:     Head: Normocephalic and atraumatic.     Right Ear: Tympanic membrane, ear canal and external ear normal.     Left Ear: Tympanic membrane, ear canal and external ear normal.  Eyes:     Extraocular Movements: Extraocular movements intact.     Pupils: Pupils are equal, round, and reactive to light.  Cardiovascular:     Rate and Rhythm: Normal rate and regular rhythm.     Heart sounds: Normal heart sounds. No murmur heard.   No gallop.  Pulmonary:     Effort: Pulmonary effort is normal. No respiratory distress.     Breath sounds: Normal breath sounds. No wheezing or rales.  Abdominal:     General: Bowel sounds are normal. There is no distension.     Palpations: Abdomen is soft. There is no mass.     Tenderness: There is no abdominal tenderness. There is no guarding or rebound.  Skin:    General: Skin is warm and dry.  Neurological:     Mental Status: She is alert and oriented to person, place, and time.  Psychiatric:        Behavior: Behavior normal.    BP 140/86 (BP Location: Right Arm, Patient Position: Sitting, Cuff Size: Large)   Pulse 80   Temp 98.7 F (37.1 C) (Oral)   Resp 18   Ht 5' 0.5" (1.537 m)   Wt 232 lb (105.2 kg)   SpO2 96%   BMI 44.56 kg/m  Wt Readings from Last 3 Encounters:  03/08/21 232 lb (105.2 kg)  06/13/20 243 lb (110.2 kg)  05/09/20 248 lb 6.4 oz (112.7 kg)    Diabetic Foot Exam - Simple   No data filed    Lab Results  Component Value Date   WBC 4.7 05/09/2020   HGB 13.9 05/09/2020   HCT 41.7 05/09/2020   PLT 174 05/09/2020   GLUCOSE 95  05/09/2020   CHOL 168 05/09/2020   TRIG 70 05/09/2020   HDL 76 05/09/2020   LDLDIRECT 129.1 06/13/2011   LDLCALC 77 05/09/2020   ALT 13 05/09/2020   AST 20 05/09/2020   NA 141 05/09/2020   K 4.3 05/09/2020   CL 104 05/09/2020   CREATININE 0.66 05/09/2020   BUN 16 05/09/2020   CO2 26 05/09/2020   TSH 3.80 05/09/2020   HGBA1C 5.6 05/09/2020   MICROALBUR 0.2 02/01/2013    Lab Results  Component Value Date   TSH 3.80 05/09/2020   Lab Results  Component Value Date   WBC 4.7 05/09/2020   HGB 13.9 05/09/2020   HCT 41.7 05/09/2020   MCV 90.7 05/09/2020   PLT 174 05/09/2020   Lab Results  Component Value Date   NA 141 05/09/2020   K 4.3 05/09/2020   CO2 26 05/09/2020   GLUCOSE 95 05/09/2020   BUN 16 05/09/2020   CREATININE 0.66 05/09/2020   BILITOT 0.4 05/09/2020   ALKPHOS 85 03/02/2020   AST 20 05/09/2020   ALT 13 05/09/2020   PROT 6.2 05/09/2020   ALBUMIN 4.3 03/02/2020   CALCIUM 9.1 05/09/2020   GFR 80.97 03/02/2020   Lab Results  Component Value Date   CHOL 168 05/09/2020   Lab Results  Component Value Date   HDL 76 05/09/2020   Lab Results  Component Value Date   LDLCALC 77 05/09/2020   Lab Results  Component Value  Date   TRIG 70 05/09/2020   Lab Results  Component Value Date   CHOLHDL 2.2 05/09/2020   Lab Results  Component Value Date   HGBA1C 5.6 05/09/2020   Mammogram- Last completed 02/11/2020. Results are normal. Repeat in 1 year.  Pap smear- Last completed 10/24/2016.  Colonoscopy- Last completed 04/17/2015. Results showed diverticulosis in sigmoid colon, otherwise results are normal. Repeat in 5 years. Due.      Assessment & Plan:   Problem List Items Addressed This Visit       Unprioritized   Primary osteoarthritis of both knees   Relevant Orders   Ambulatory referral to Physical Therapy   Morbid obesity ([place])   Relevant Orders   Amb Ref to Medical Weight Management   Ambulatory referral to Physical Therapy   Depression  with anxiety   Relevant Medications   sertraline (ZOLOFT) 100 MG tablet   Essential hypertension    Well controlled, no changes to meds. Encouraged heart healthy diet such as the DASH diet and exercise as tolerated.       Hyperlipidemia    Encourage heart healthy diet such as MIND or DASH diet, increase exercise, avoid trans fats, simple carbohydrates and processed foods, consider a krill or fish or flaxseed oil cap daily.       Hypothyroidism    Check tsh con't meds      Preventative health care - Primary    Check labs  See avs ghm utd       Relevant Orders   TSH   Lipid panel   Hemoglobin A1c   CBC with Differential/Platelet   Comprehensive metabolic panel   Other Visit Diagnoses     Need for pneumococcal vaccination            Meds ordered this encounter  Medications   sertraline (ZOLOFT) 100 MG tablet    Sig: Take 1 tablet (100 mg total) by mouth daily.    Dispense:  90 tablet    Refill:  3    I, Dr. Roma Schanz, DO, personally preformed the services described in this documentation.  All medical record entries made by the scribe were at my direction and in my presence.  I have reviewed the chart and discharge instructions (if applicable) and agree that the record reflects my personal performance and is accurate and complete. 03/08/2021   I,Shehryar Baig,acting as a scribe for [name], DO.,have documented all relevant documentation on the behalf of [name], DO,as directed by  [name], DO while in the presence of [name], DO.   [name], DO

## 2021-03-08 NOTE — Assessment & Plan Note (Signed)
Encourage heart healthy diet such as MIND or DASH diet, increase exercise, avoid trans fats, simple carbohydrates and processed foods, consider a krill or fish or flaxseed oil cap daily.  °

## 2021-03-08 NOTE — Patient Instructions (Signed)
Preventive Care 73 Years and Older, Female Preventive care refers to lifestyle choices and visits with your health care provider that can promote health and wellness. This includes: A yearly physical exam. This is also called an annual wellness visit. Regular dental and eye exams. Immunizations. Screening for certain conditions. Healthy lifestyle choices, such as: Eating a healthy diet. Getting regular exercise. Not using drugs or products that contain nicotine and tobacco. Limiting alcohol use. What can I expect for my preventive care visit? Physical exam Your health care provider will check your: Height and weight. These may be used to calculate your BMI (body mass index). BMI is a measurement that tells if you are at a healthy weight. Heart rate and blood pressure. Body temperature. Skin for abnormal spots. Counseling Your health care provider may ask you questions about your: Past medical problems. Family's medical history. Alcohol, tobacco, and drug use. Emotional well-being. Home life and relationship well-being. Sexual activity. Diet, exercise, and sleep habits. History of falls. Memory and ability to understand (cognition). Work and work Statistician. Pregnancy and menstrual history. Access to firearms. What immunizations do I need?  Vaccines are usually given at various ages, according to a schedule. Your health care provider will recommend vaccines for you based on your age, medicalhistory, and lifestyle or other factors, such as travel or where you work. What tests do I need? Blood tests Lipid and cholesterol levels. These may be checked every 5 years, or more often depending on your overall health. Hepatitis C test. Hepatitis B test. Screening Lung cancer screening. You may have this screening every year starting at age 44 if you have a 30-pack-year history of smoking and currently smoke or have quit within the past 15 years. Colorectal cancer screening. All  adults should have this screening starting at age 39 and continuing until age 65. Your health care provider may recommend screening at age 61 if you are at increased risk. You will have tests every 1-10 years, depending on your results and the type of screening test. Diabetes screening. This is done by checking your blood sugar (glucose) after you have not eaten for a while (fasting). You may have this done every 1-3 years. Mammogram. This may be done every 1-2 years. Talk with your health care provider about how often you should have regular mammograms. Abdominal aortic aneurysm (AAA) screening. You may need this if you are a current or former smoker. BRCA-related cancer screening. This may be done if you have a family history of breast, ovarian, tubal, or peritoneal cancers. Other tests STD (sexually transmitted disease) testing, if you are at risk. Bone density scan. This is done to screen for osteoporosis. You may have this done starting at age 54. Talk with your health care provider about your test results, treatment options,and if necessary, the need for more tests. Follow these instructions at home: Eating and drinking  Eat a diet that includes fresh fruits and vegetables, whole grains, lean protein, and low-fat dairy products. Limit your intake of foods with high amounts of sugar, saturated fats, and salt. Take vitamin and mineral supplements as recommended by your health care provider. Do not drink alcohol if your health care provider tells you not to drink. If you drink alcohol: Limit how much you have to 0-1 drink a day. Be aware of how much alcohol is in your drink. In the [place] one drink equals one 12 oz bottle of beer (355 mL), one 5 oz glass of wine (148 mL), or one 1  oz glass of hard liquor (44 mL).  Lifestyle Take daily care of your teeth and gums. Brush your teeth every morning and night with fluoride toothpaste. Floss one time each day. Stay active. Exercise for at  least 30 minutes 5 or more days each week. Do not use any products that contain nicotine or tobacco, such as cigarettes, e-cigarettes, and chewing tobacco. If you need help quitting, ask your health care provider. Do not use drugs. If you are sexually active, practice safe sex. Use a condom or other form of protection in order to prevent STIs (sexually transmitted infections). Talk with your health care provider about taking a low-dose aspirin or statin. Find healthy ways to cope with stress, such as: Meditation, yoga, or listening to music. Journaling. Talking to a trusted person. Spending time with friends and family. Safety Always wear your seat belt while driving or riding in a vehicle. Do not drive: If you have been drinking alcohol. Do not ride with someone who has been drinking. When you are tired or distracted. While texting. Wear a helmet and other protective equipment during sports activities. If you have firearms in your house, make sure you follow all gun safety procedures. What's next? Visit your health care provider once a year for an annual wellness visit. Ask your health care provider how often you should have your eyes and teeth checked. Stay up to date on all vaccines. This information is not intended to replace advice given to you by your health care provider. Make sure you discuss any questions you have with your healthcare provider. Document Revised: 06/21/2020 Document Reviewed: 06/25/2018 Elsevier Patient Education  2022 [employer].

## 2021-03-08 NOTE — Assessment & Plan Note (Signed)
Check tsh con't meds 

## 2021-03-09 LAB — COMPREHENSIVE METABOLIC PANEL
ALT: 17 U/L (ref 0–35)
AST: 23 U/L (ref 0–37)
Albumin: 4.3 g/dL (ref 3.5–5.2)
Alkaline Phosphatase: 80 U/L (ref 39–117)
BUN: 16 mg/dL (ref 6–23)
CO2: 28 mEq/L (ref 19–32)
Calcium: 9.4 mg/dL (ref 8.4–10.5)
Chloride: 100 mEq/L (ref 96–112)
Creatinine, Ser: 0.68 mg/dL (ref 0.40–1.20)
GFR: 86.79 mL/min (ref >60.00)
Glucose, Bld: 88 mg/dL (ref 70–99)
Potassium: 4.5 mEq/L (ref 3.5–5.1)
Sodium: 138 mEq/L (ref 135–145)
Total Bilirubin: 0.5 mg/dL (ref 0.2–1.2)
Total Protein: 6.7 g/dL (ref 6.0–8.3)

## 2021-03-09 LAB — CBC WITH DIFFERENTIAL/PLATELET
Basophils Absolute: 0 10*3/uL (ref 0.0–0.1)
Basophils Relative: 0.7 % (ref 0.0–3.0)
Eosinophils Absolute: 0.1 10*3/uL (ref 0.0–0.7)
Eosinophils Relative: 1.4 % (ref 0.0–5.0)
HCT: 42.2 % (ref 36.0–46.0)
Hemoglobin: 13.8 g/dL (ref 12.0–15.0)
Lymphocytes Relative: 21 % (ref 12.0–46.0)
Lymphs Abs: 1 10*3/uL (ref 0.7–4.0)
MCHC: 32.8 g/dL (ref 30.0–36.0)
MCV: 90.4 fl (ref 78.0–100.0)
Monocytes Absolute: 0.4 10*3/uL (ref 0.1–1.0)
Monocytes Relative: 8.1 % (ref 3.0–12.0)
Neutro Abs: 3.2 10*3/uL (ref 1.4–7.7)
Neutrophils Relative %: 68.8 % (ref 43.0–77.0)
Platelets: 182 10*3/uL (ref 150.0–400.0)
RBC: 4.67 Mil/uL (ref 3.87–5.11)
RDW: 15.2 % (ref 11.5–15.5)
WBC: 4.6 10*3/uL (ref 4.0–10.5)

## 2021-03-09 LAB — LIPID PANEL
Cholesterol: 181 mg/dL (ref 0–200)
HDL: 76.9 mg/dL (ref >39.00)
LDL Cholesterol: 90 mg/dL (ref 0–99)
NonHDL: 104.29
Total CHOL/HDL Ratio: 2
Triglycerides: 72 mg/dL (ref 0.0–149.0)
VLDL: 14.4 mg/dL (ref 0.0–40.0)

## 2021-03-09 LAB — TSH: TSH: 4.19 u[IU]/mL (ref 0.35–5.50)

## 2021-03-09 LAB — HEMOGLOBIN A1C: Hgb A1c MFr Bld: 5.7 % (ref 4.6–6.5)

## 2021-03-14 ENCOUNTER — Encounter: Payer: Self-pay | Admitting: Family Medicine

## 2021-03-20 ENCOUNTER — Ambulatory Visit (HOSPITAL_BASED_OUTPATIENT_CLINIC_OR_DEPARTMENT_OTHER)
Admission: RE | Admit: 2021-03-20 | Discharge: 2021-03-20 | Disposition: A | Discharge status: Home / Self Care | Payer: 59 | Source: Ambulatory Visit | Attending: Family Medicine | Admitting: Family Medicine

## 2021-03-20 ENCOUNTER — Other Ambulatory Visit: Payer: Self-pay

## 2021-03-20 ENCOUNTER — Encounter (HOSPITAL_BASED_OUTPATIENT_CLINIC_OR_DEPARTMENT_OTHER): Payer: Self-pay

## 2021-03-20 DIAGNOSIS — Z1231 Encounter for screening mammogram for malignant neoplasm of breast: Secondary | ICD-10-CM | POA: Insufficient documentation

## 2021-03-31 ENCOUNTER — Other Ambulatory Visit: Payer: Self-pay | Admitting: Family Medicine

## 2021-03-31 DIAGNOSIS — E039 Hypothyroidism, unspecified: Secondary | ICD-10-CM

## 2021-05-03 ENCOUNTER — Other Ambulatory Visit: Payer: Self-pay | Admitting: Family Medicine

## 2021-05-16 ENCOUNTER — Telehealth: Payer: Self-pay | Admitting: Family Medicine

## 2021-05-16 NOTE — Telephone Encounter (Signed)
Contacted pt regarding appointment scheduled for Friday 11/4 for atrial fibrillation. Advised pt that I will transfer her to Triage regarding symptoms that are still occurring. Also informed her that the on call nurse will let her know to seek medical attention immediately or if appointment can be kept.

## 2021-05-16 NOTE — Telephone Encounter (Signed)
Noted  

## 2021-05-17 ENCOUNTER — Telehealth: Payer: Self-pay | Admitting: *Deleted

## 2021-05-17 ENCOUNTER — Other Ambulatory Visit: Payer: Self-pay | Admitting: Family Medicine

## 2021-05-17 DIAGNOSIS — R7303 Prediabetes: Secondary | ICD-10-CM

## 2021-05-17 NOTE — Telephone Encounter (Signed)
FYI Patient was advised to go to ER, but did not and has an appointment scheduled for tomorrow.

## 2021-05-17 NOTE — Telephone Encounter (Signed)
error 

## 2021-05-17 NOTE — Telephone Encounter (Signed)
Who Is Calling Patient / Member / Family / Caregiver Call Type Triage / Clinical Relationship To Patient Self Return Phone Number [phone] (Primary) Chief Complaint Heart palpitations or irregular heartbeat Reason for Call Symptomatic / Request for Health Information Initial Comment Caller states she has an appointment at 240 on Friday. She thinks she has A-Fib. Translation No Nurse Assessment Nurse: [name] Deist, RN, Mickel Baas Date/Time Eilene Ghazi Time): 05/16/2021 4:24:04 PM Confirm and document reason for call. If symptomatic, describe symptoms. ---Caller states she has an appointment at [facility] on Friday. She thinks she has A-Fib. At night feels heart flutters. Urinating frequently at night which is new.  Guidelines Guideline Title Affirmed Question Affirmed Notes Nurse Date/Time (Eastern Time) Heart Rate and Heartbeat Questions New or worsened shortness of breath with activity (dyspnea on exertion) [name] Deist, RN, Mickel Baas 05/16/2021 4:26:25 PM Disp. Time Eilene Ghazi Time) Disposition Final User 05/16/2021 4:33:45 PM Go to ED Now (or PCP triage) Yes [name] Deist, RN, Mickel Baas

## 2021-05-18 ENCOUNTER — Encounter: Payer: Self-pay | Admitting: Family Medicine

## 2021-05-18 ENCOUNTER — Other Ambulatory Visit: Payer: Self-pay

## 2021-05-18 ENCOUNTER — Ambulatory Visit: Payer: 59 | Admitting: Family Medicine

## 2021-05-18 VITALS — BP 110/68 | HR 72 | Temp 98.3°F | Resp 18 | Ht 60.5 in | Wt 230.4 lb

## 2021-05-18 DIAGNOSIS — Z0000 Encounter for general adult medical examination without abnormal findings: Secondary | ICD-10-CM

## 2021-05-18 DIAGNOSIS — F419 Anxiety disorder, unspecified: Secondary | ICD-10-CM

## 2021-05-18 DIAGNOSIS — R002 Palpitations: Secondary | ICD-10-CM | POA: Diagnosis not present

## 2021-05-18 MED ORDER — ALPRAZOLAM 0.25 MG PO TABS
0.2500 mg | ORAL_TABLET | Freq: Two times a day (BID) | ORAL | 0 refills | Status: DC | PRN
Start: 2021-05-18 — End: 2022-08-30

## 2021-05-18 NOTE — Assessment & Plan Note (Signed)
Event monitor  Echo Xanax for anxiety To er if worsening

## 2021-05-18 NOTE — Patient Instructions (Signed)
Palpitations Palpitations are feelings that your heartbeat is irregular or is faster than normal. It may feel like your heart is fluttering or skipping a beat. Palpitations may be caused by many things, including smoking, caffeine, alcohol, stress, and certain medicines or drugs. Most causes of palpitations are not serious.  However, some palpitations can be a sign of a serious problem. Further tests and a thorough medical history will be done to find the cause of your palpitations. Your provider may order tests such as an ECG, labs, an echocardiogram, or an ambulatory continuous ECG monitor. Follow these instructions at home: Pay attention to any changes in your symptoms. Let your health care provider know about them. Take these actions to help manage your symptoms: Eating and drinking Follow instructions from your health care provider about eating or drinking restrictions. You may need to avoid foods and drinks that may cause palpitations. These may include: Caffeinated coffee, tea, soft drinks, and energy drinks. Chocolate. Alcohol. Diet pills. Lifestyle   Take steps to reduce your stress and anxiety. Things that can help you relax include: Yoga. Mind-body activities, such as deep breathing, meditation, or using words and images to create positive thoughts (guided imagery). Physical activity, such as swimming, jogging, or walking. Tell your health care provider if your palpitations increase with activity. If you have chest pain or shortness of breath with activity, do not continue the activity until you are seen by your health care provider. Biofeedback. This is a method that helps you learn to use your mind to control things in your body, such as your heartbeat. Get plenty of rest and sleep. Keep a regular bed time. Do not use drugs, including cocaine or ecstasy. Do not use marijuana. Do not use any products that contain nicotine or tobacco. These products include cigarettes, chewing tobacco,  and vaping devices, such as e-cigarettes. If you need help quitting, ask your health care provider. General instructions Take over-the-counter and prescription medicines only as told by your health care provider. Keep all follow-up visits. This is important. These may include visits for further testing if palpitations do not go away or get worse. Contact a health care provider if: You continue to have a fast or irregular heartbeat for a long period of time. You notice that your palpitations occur more often. Get help right away if: You have chest pain or shortness of breath. You have a severe headache. You feel dizzy or you faint. These symptoms may represent a serious problem that is an emergency. Do not wait to see if the symptoms will go away. Get medical help right away. Call your local emergency services (911 in the [place] Do not drive yourself to the hospital. Summary Palpitations are feelings that your heartbeat is irregular or is faster than normal. It may feel like your heart is fluttering or skipping a beat. Palpitations may be caused by many things, including smoking, caffeine, alcohol, stress, certain medicines, and drugs. Further tests and a thorough medical history may be done to find the cause of your palpitations. Get help right away if you faint or have chest pain, shortness of breath, severe headache, or dizziness. This information is not intended to replace advice given to you by your health care provider. Make sure you discuss any questions you have with your health care provider. Document Revised: 11/22/2020 Document Reviewed: 11/22/2020 Elsevier Patient Education  [employer].

## 2021-05-18 NOTE — Progress Notes (Addendum)
Subjective:   By signing my name below, I, Zite Okoli, attest that this documentation has been prepared under the direction and in the presence of Roma Schanz R DO. 05/18/2021     Patient ID: [name] [name], female    DOB: 08-Jun-1946, 75 y.o.   MRN: [id number]  Chief Complaint  Patient presents with   Palpitations    Pt states going on x2 weeks. Pt states seeing UC on the 2nd. UC wanted patient to f/u with Cardio. They did not place a referral.     HPI Patient is in today for an office visit.  She reports that 2 weeks ago, she started having "flutters" in her chest. They normally occur in the morning along with lightheadedness and at night before she goes to sleep. She does not notice it during the day often but might experience shortness of breath. She describes it as feeling her heart racing and always feels like she needs to catch her breath. She does not drink alcohol or caffeine but thinks her tea might contain caffeine. She has also been feeling very tired lately and has to catch her breath more often.   An EKG was done when she went to urgent care and it was normal. NSR rate-67 bpm  She thinks her depression and anxiety are under check because she has been going to physical therapy and likes how it makes her feel.  She has also noticed bags under her eyes but mentions she is sleeping well enough.   Her blood pressure at this pressure is stable. BP Readings from Last 3 Encounters:  05/18/21 110/68  03/08/21 140/86  06/13/20 135/80   She was diagnosed with sleep apnea but never went to pick up her CPAP machine.   Past Medical History:  Diagnosis Date   Allergic rhinitis    Arthritis    Cataract    Depression    Diabetes mellitus without complication ([place])    Emphysema lung ([place])    Early stage   Endometrial polyp    Hyperlipidemia    Hyperlipidemia    on simvastatin   Hypothyroidism    IBS (irritable bowel syndrome)    Obesity    Osteoarthritis     Seasonal allergies     Past Surgical History:  Procedure Laterality Date   BUNIONECTOMY  2009   Dr.Duda, left   EYE SURGERY     HYSTEROSCOPY WITH D & C  04/24/2011   Procedure: DILATATION AND CURETTAGE (D&C) /HYSTEROSCOPY;  Surgeon: Eldred Manges, MD;  Location: [facility];  Service: Gynecology;  Laterality: N/A;   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   Dr Chrys Racer FASCIA SURGERY  1994   Dr Percell Miller   POLYPECTOMY  04/24/2011   Procedure: POLYPECTOMY;  Surgeon: Eldred Manges, MD;  Location: [facility];  Service: Gynecology;  Laterality: N/A;    Family History  Problem Relation Age of Onset   Colon cancer Father 53   Cancer Father 33       colon   Alcoholism Father    Pancreatic cancer Mother 24   Cancer Mother 46       pancreatic   Colon cancer Paternal Grandmother    Cancer Paternal Grandmother 22       colon   Heart attack Maternal Grandmother 24   Heart disease Maternal Grandmother    Cancer Sister 11       melanoma   Breast cancer Sister    Cancer  Brother 20       skin   Thyroid disease Sister    Breast cancer Maternal Aunt 30   Heart attack Maternal Aunt 60   Breast cancer Sister     Social History   Socioeconomic History   Marital status: Married    Spouse name: Aleathia Purdy   Number of children: 0   Years of education: Not on file   Highest education level: Master's degree (e.g., MA, MS, MEng, MEd, MSW, MBA)  Occupational History   Occupation: Daycare p/t    Comment: retired  Tobacco Use   Smoking status: Former    Packs/day: 2.00    Years: 42.00    Pack years: 84.00    Types: Cigarettes    Quit date: 07/28/2012    Years since quitting: 8.8   Smokeless tobacco: Never  Vaping Use   Vaping Use: Former  Substance and Sexual Activity   Alcohol use: Yes    Comment: rare   Drug use: Never   Sexual activity: Yes    Partners: Male    Birth control/protection: Post-menopausal  Other Topics Concern   Not on file  Social History Narrative    Regular exercise- no    Lives with spouse   Caffeine- 2 teas daily   Social Determinants of Health   Financial Resource Strain: Not on file  Food Insecurity: Not on file  Transportation Needs: Not on file  Physical Activity: Not on file  Stress: Not on file  Social Connections: Not on file  Intimate Partner Violence: Not on file    Outpatient Medications Prior to Visit  Medication Sig Dispense Refill   levothyroxine (SYNTHROID) 125 MCG tablet Take 1 tablet (125 mcg total) by mouth daily before breakfast. 90 tablet 1   loperamide (IMODIUM) 2 MG capsule TAKE 1 CAPSULE BY MOUTH  TWICE DAILY 180 capsule 1   metFORMIN (GLUCOPHAGE) 500 MG tablet TAKE 1 TABLET BY MOUTH  DAILY WITH BREAKFAST 90 tablet 1   multivitamin (THERAGRAN) per tablet Take 1 tablet by mouth daily.       sertraline (ZOLOFT) 100 MG tablet Take 1 tablet (100 mg total) by mouth daily. 90 tablet 3   simvastatin (ZOCOR) 20 MG tablet TAKE 1 TABLET BY MOUTH AT  BEDTIME (NEED OFFICE VISIT  FOR FURTHER REFILLS) 30 tablet 11   Vitamin D, Ergocalciferol, (DRISDOL) 1.25 MG (50000 UNIT) CAPS capsule TAKE 1 CAPSULE BY MOUTH  EVERY 7 DAYS 13 capsule 3   No facility-administered medications prior to visit.    No Known Allergies  Review of Systems  Constitutional:  Positive for malaise/fatigue. Negative for fever.  HENT:  Negative for congestion, ear pain, hearing loss, sinus pain and sore throat.   Eyes:  Negative for blurred vision and pain.  Respiratory:  Negative for cough, sputum production, shortness of breath and wheezing.   Cardiovascular:  Negative for chest pain and palpitations.  Gastrointestinal:  Negative for blood in stool, constipation, diarrhea, nausea and vomiting.  Genitourinary:  Negative for dysuria, frequency, hematuria and urgency.  Musculoskeletal:  Negative for back pain, falls and myalgias.  Neurological:  Negative for dizziness, sensory change, loss of consciousness, weakness and headaches.   Endo/Heme/Allergies:  Negative for environmental allergies. Does not bruise/bleed easily.  Psychiatric/Behavioral:  Negative for depression and suicidal ideas. The patient is nervous/anxious. The patient does not have insomnia.       Objective:    Physical Exam Constitutional:      General: She is not in  acute distress.    Appearance: Normal appearance. She is not ill-appearing.  HENT:     Head: Normocephalic and atraumatic.     Right Ear: External ear normal.     Left Ear: External ear normal.  Eyes:     Extraocular Movements: Extraocular movements intact.     Pupils: Pupils are equal, round, and reactive to light.  Cardiovascular:     Rate and Rhythm: Normal rate and regular rhythm.     Pulses: Normal pulses.     Heart sounds: Normal heart sounds. No murmur heard.   No gallop.  Pulmonary:     Effort: Pulmonary effort is normal. No respiratory distress.     Breath sounds: Normal breath sounds. No wheezing, rhonchi or rales.  Abdominal:     General: Bowel sounds are normal. There is no distension.     Palpations: Abdomen is soft. There is no mass.     Tenderness: There is no abdominal tenderness. There is no guarding or rebound.     Hernia: No hernia is present.  Musculoskeletal:     Cervical back: Normal range of motion and neck supple.  Lymphadenopathy:     Cervical: No cervical adenopathy.  Skin:    General: Skin is warm and dry.  Neurological:     Mental Status: She is alert and oriented to person, place, and time.  Psychiatric:        Behavior: Behavior normal.    BP 110/68 (BP Location: Left Arm, Patient Position: Sitting, Cuff Size: Normal)   Pulse 72   Temp 98.3 F (36.8 C) (Oral)   Resp 18   Ht 5' 0.5" (1.537 m)   Wt 230 lb 6.4 oz (104.5 kg)   SpO2 98%   BMI 44.26 kg/m  Wt Readings from Last 3 Encounters:  05/18/21 230 lb 6.4 oz (104.5 kg)  03/08/21 232 lb (105.2 kg)  06/13/20 243 lb (110.2 kg)    Diabetic Foot Exam - Simple   No data filed     Lab Results  Component Value Date   WBC 4.6 03/08/2021   HGB 13.8 03/08/2021   HCT 42.2 03/08/2021   PLT 182.0 03/08/2021   GLUCOSE 88 03/08/2021   CHOL 181 03/08/2021   TRIG 72.0 03/08/2021   HDL 76.90 03/08/2021   LDLDIRECT 129.1 06/13/2011   LDLCALC 90 03/08/2021   ALT 17 03/08/2021   AST 23 03/08/2021   NA 138 03/08/2021   K 4.5 03/08/2021   CL 100 03/08/2021   CREATININE 0.68 03/08/2021   BUN 16 03/08/2021   CO2 28 03/08/2021   TSH 4.19 03/08/2021   HGBA1C 5.7 03/08/2021   MICROALBUR 0.2 02/01/2013    Lab Results  Component Value Date   TSH 4.19 03/08/2021   Lab Results  Component Value Date   WBC 4.6 03/08/2021   HGB 13.8 03/08/2021   HCT 42.2 03/08/2021   MCV 90.4 03/08/2021   PLT 182.0 03/08/2021   Lab Results  Component Value Date   NA 138 03/08/2021   K 4.5 03/08/2021   CO2 28 03/08/2021   GLUCOSE 88 03/08/2021   BUN 16 03/08/2021   CREATININE 0.68 03/08/2021   BILITOT 0.5 03/08/2021   ALKPHOS 80 03/08/2021   AST 23 03/08/2021   ALT 17 03/08/2021   PROT 6.7 03/08/2021   ALBUMIN 4.3 03/08/2021   CALCIUM 9.4 03/08/2021   GFR 86.79 03/08/2021   Lab Results  Component Value Date   CHOL 181 03/08/2021   Lab Results  Component Value Date   HDL 76.90 03/08/2021   Lab Results  Component Value Date   LDLCALC 90 03/08/2021   Lab Results  Component Value Date   TRIG 72.0 03/08/2021   Lab Results  Component Value Date   CHOLHDL 2 03/08/2021   Lab Results  Component Value Date   HGBA1C 5.7 03/08/2021       Assessment & Plan:   Problem List Items Addressed This Visit       Unprioritized   Anxiety   Relevant Medications   ALPRAZolam (XANAX) 0.25 MG tablet   Palpitations - Primary    Event monitor  Echo Xanax for anxiety To er if worsening       Relevant Orders   Cardiac event monitor   ECHOCARDIOGRAM LIMITED BUBBLE STUDY   CBC with Differential/Platelet   Comprehensive metabolic panel   TSH   Preventative health care     Meds ordered this encounter  Medications   ALPRAZolam (XANAX) 0.25 MG tablet    Sig: Take 1 tablet (0.25 mg total) by mouth 2 (two) times daily as needed for anxiety.    Dispense:  20 tablet    Refill:  0   I,Zite Okoli,acting as a scribe for [employer], DO.,have documented all relevant documentation on the behalf of [name], DO,as directed by  [name], DO while in the presence of [name], DO.   I,  [name] DO., personally preformed the services described in this documentation.  All medical record entries made by the scribe were at my direction and in my presence.  I have reviewed the chart and discharge instructions (if applicable) and agree that the record reflects my personal performance and is accurate and complete. 05/18/2021

## 2021-05-18 NOTE — Telephone Encounter (Signed)
Spoke with pt. Pt states not having chest pain. Pt confirms palpitations. Pt advised if sxs get worse to go to the ER. Pt verbalized understanding.

## 2021-05-18 NOTE — Assessment & Plan Note (Deleted)
ghm utd Check labs  See avs  

## 2021-05-20 LAB — CBC WITH DIFFERENTIAL/PLATELET

## 2021-05-20 LAB — COMPREHENSIVE METABOLIC PANEL
AG Ratio: 2 (calc) (ref 1.0–2.5)
ALT: 13 U/L (ref 6–29)
AST: 18 U/L (ref 10–35)
Albumin: 4 g/dL (ref 3.6–5.1)
Alkaline phosphatase (APISO): 73 U/L (ref 37–153)
BUN: 17 mg/dL (ref 7–25)
CO2: 29 mmol/L (ref 20–32)
Calcium: 9 mg/dL (ref 8.6–10.4)
Chloride: 104 mmol/L (ref 98–110)
Creat: 0.66 mg/dL (ref 0.60–1.00)
Globulin: 2 g/dL (calc) (ref 1.9–3.7)
Glucose, Bld: 90 mg/dL (ref 65–99)
Potassium: 4.1 mmol/L (ref 3.5–5.3)
Sodium: 141 mmol/L (ref 135–146)
Total Bilirubin: 0.5 mg/dL (ref 0.2–1.2)
Total Protein: 6 g/dL — ABNORMAL LOW (ref 6.1–8.1)

## 2021-05-20 LAB — TSH: TSH: 1.26 mIU/L (ref 0.40–4.50)

## 2021-05-22 ENCOUNTER — Encounter: Payer: Self-pay | Admitting: Family Medicine

## 2021-05-23 NOTE — Telephone Encounter (Signed)
Spoke with pt and informed her we are still awaiting approval

## 2021-05-23 NOTE — Telephone Encounter (Signed)
Please advise. I cannot see anything in chart.

## 2021-05-24 ENCOUNTER — Other Ambulatory Visit: Payer: Self-pay | Admitting: Family Medicine

## 2021-05-24 ENCOUNTER — Other Ambulatory Visit: Payer: Self-pay

## 2021-05-24 ENCOUNTER — Ambulatory Visit: Payer: 59

## 2021-05-24 ENCOUNTER — Other Ambulatory Visit (INDEPENDENT_AMBULATORY_CARE_PROVIDER_SITE_OTHER): Payer: 59

## 2021-05-24 DIAGNOSIS — R002 Palpitations: Secondary | ICD-10-CM

## 2021-05-24 DIAGNOSIS — R0600 Dyspnea, unspecified: Secondary | ICD-10-CM

## 2021-05-24 LAB — CBC WITH DIFFERENTIAL/PLATELET
Basophils Absolute: 0 10*3/uL (ref 0.0–0.1)
Basophils Relative: 0.9 % (ref 0.0–3.0)
Eosinophils Absolute: 0.1 10*3/uL (ref 0.0–0.7)
Eosinophils Relative: 2.2 % (ref 0.0–5.0)
HCT: 41.3 % (ref 36.0–46.0)
Hemoglobin: 13.5 g/dL (ref 12.0–15.0)
Lymphocytes Relative: 25.2 % (ref 12.0–46.0)
Lymphs Abs: 1.1 10*3/uL (ref 0.7–4.0)
MCHC: 32.7 g/dL (ref 30.0–36.0)
MCV: 89.6 fl (ref 78.0–100.0)
Monocytes Absolute: 0.4 10*3/uL (ref 0.1–1.0)
Monocytes Relative: 8.8 % (ref 3.0–12.0)
Neutro Abs: 2.8 10*3/uL (ref 1.4–7.7)
Neutrophils Relative %: 62.9 % (ref 43.0–77.0)
Platelets: 173 10*3/uL (ref 150.0–400.0)
RBC: 4.6 Mil/uL (ref 3.87–5.11)
RDW: 15 % (ref 11.5–15.5)
WBC: 4.5 10*3/uL (ref 4.0–10.5)

## 2021-05-24 NOTE — Telephone Encounter (Signed)
Kamera calling from [facility] is calling to let us know that the order for the monitor was sent to the wrong place. She will reach back out once she knows where it's supposed to be sent to.

## 2021-05-24 NOTE — Progress Notes (Signed)
Pt here for repeat labs, no charge due today

## 2021-05-24 NOTE — Telephone Encounter (Signed)
[name] [name] called back to let us know that they will call the patient and get her he heart monitor today, and to disregard her last message.

## 2021-05-25 ENCOUNTER — Ambulatory Visit (HOSPITAL_BASED_OUTPATIENT_CLINIC_OR_DEPARTMENT_OTHER)
Admission: RE | Admit: 2021-05-25 | Discharge: 2021-05-25 | Disposition: A | Discharge status: Home / Self Care | Payer: 59 | Source: Ambulatory Visit | Attending: Family Medicine | Admitting: Family Medicine

## 2021-05-25 ENCOUNTER — Other Ambulatory Visit: Payer: Self-pay

## 2021-05-25 DIAGNOSIS — R0600 Dyspnea, unspecified: Secondary | ICD-10-CM | POA: Insufficient documentation

## 2021-05-25 DIAGNOSIS — R0609 Other forms of dyspnea: Secondary | ICD-10-CM | POA: Diagnosis not present

## 2021-05-25 DIAGNOSIS — R002 Palpitations: Secondary | ICD-10-CM | POA: Insufficient documentation

## 2021-05-27 LAB — ECHOCARDIOGRAM COMPLETE
AR max vel: 1.87 cm2
AV Area VTI: 1.83 cm2
AV Area mean vel: 1.7 cm2
AV Mean grad: 9.5 mmHg
AV Peak grad: 16.5 mmHg
Ao pk vel: 2.03 m/s
Area-P 1/2: 3.01 cm2
Calc EF: 67.8 %
P 1/2 time: 673 msec
S' Lateral: 2.1 cm
Single Plane A2C EF: 69.5 %
Single Plane A4C EF: 66.8 %

## 2021-05-28 ENCOUNTER — Other Ambulatory Visit: Payer: Self-pay

## 2021-05-28 ENCOUNTER — Ambulatory Visit (INDEPENDENT_AMBULATORY_CARE_PROVIDER_SITE_OTHER): Payer: 59

## 2021-05-28 ENCOUNTER — Encounter: Payer: Self-pay | Admitting: *Deleted

## 2021-05-28 DIAGNOSIS — R002 Palpitations: Secondary | ICD-10-CM | POA: Diagnosis not present

## 2021-06-01 ENCOUNTER — Encounter: Payer: Self-pay | Admitting: Family Medicine

## 2021-06-28 ENCOUNTER — Other Ambulatory Visit: Payer: Self-pay | Admitting: Family Medicine

## 2021-06-28 DIAGNOSIS — F418 Other specified anxiety disorders: Secondary | ICD-10-CM

## 2021-07-03 ENCOUNTER — Encounter: Payer: Self-pay | Admitting: Family Medicine

## 2021-07-03 DIAGNOSIS — F418 Other specified anxiety disorders: Secondary | ICD-10-CM

## 2021-07-04 MED ORDER — SERTRALINE HCL 100 MG PO TABS: 100.0000 mg | ORAL_TABLET | Freq: Every day | ORAL | 1 refills | Status: DC

## 2021-09-14 ENCOUNTER — Other Ambulatory Visit: Payer: Self-pay | Admitting: Family Medicine

## 2021-09-14 DIAGNOSIS — E039 Hypothyroidism, unspecified: Secondary | ICD-10-CM

## 2021-10-31 ENCOUNTER — Other Ambulatory Visit: Payer: Self-pay | Admitting: Family Medicine

## 2021-10-31 DIAGNOSIS — R7303 Prediabetes: Secondary | ICD-10-CM

## 2021-11-02 ENCOUNTER — Encounter: Payer: Self-pay | Admitting: Family Medicine

## 2021-11-02 ENCOUNTER — Ambulatory Visit: Payer: 59 | Admitting: Family Medicine

## 2021-11-02 VITALS — BP 110/68 | HR 71 | Temp 97.9°F | Resp 18 | Ht 60.5 in | Wt 199.8 lb

## 2021-11-02 DIAGNOSIS — E039 Hypothyroidism, unspecified: Secondary | ICD-10-CM

## 2021-11-02 DIAGNOSIS — E1165 Type 2 diabetes mellitus with hyperglycemia: Secondary | ICD-10-CM

## 2021-11-02 DIAGNOSIS — E785 Hyperlipidemia, unspecified: Secondary | ICD-10-CM

## 2021-11-02 DIAGNOSIS — R200 Anesthesia of skin: Secondary | ICD-10-CM

## 2021-11-02 DIAGNOSIS — G4700 Insomnia, unspecified: Secondary | ICD-10-CM | POA: Diagnosis not present

## 2021-11-02 DIAGNOSIS — R7303 Prediabetes: Secondary | ICD-10-CM

## 2021-11-02 DIAGNOSIS — R413 Other amnesia: Secondary | ICD-10-CM

## 2021-11-02 DIAGNOSIS — I10 Essential (primary) hypertension: Secondary | ICD-10-CM

## 2021-11-02 DIAGNOSIS — E1169 Type 2 diabetes mellitus with other specified complication: Secondary | ICD-10-CM

## 2021-11-02 LAB — CBC WITH DIFFERENTIAL/PLATELET
Basophils Absolute: 0 10*3/uL (ref 0.0–0.1)
Basophils Relative: 0.5 % (ref 0.0–3.0)
Eosinophils Absolute: 0.1 10*3/uL (ref 0.0–0.7)
Eosinophils Relative: 1.5 % (ref 0.0–5.0)
HCT: 41.9 % (ref 36.0–46.0)
Hemoglobin: 13.8 g/dL (ref 12.0–15.0)
Lymphocytes Relative: 22.4 % (ref 12.0–46.0)
Lymphs Abs: 0.9 10*3/uL (ref 0.7–4.0)
MCHC: 32.9 g/dL (ref 30.0–36.0)
MCV: 89.8 fl (ref 78.0–100.0)
Monocytes Absolute: 0.3 10*3/uL (ref 0.1–1.0)
Monocytes Relative: 6.4 % (ref 3.0–12.0)
Neutro Abs: 2.9 10*3/uL (ref 1.4–7.7)
Neutrophils Relative %: 69.2 % (ref 43.0–77.0)
Platelets: 169 10*3/uL (ref 150.0–400.0)
RBC: 4.67 Mil/uL (ref 3.87–5.11)
RDW: 14.8 % (ref 11.5–15.5)
WBC: 4.2 10*3/uL (ref 4.0–10.5)

## 2021-11-02 LAB — LIPID PANEL
Cholesterol: 144 mg/dL (ref 0–200)
HDL: 59.4 mg/dL (ref >39.00)
LDL Cholesterol: 68 mg/dL (ref 0–99)
NonHDL: 84.11
Total CHOL/HDL Ratio: 2
Triglycerides: 80 mg/dL (ref 0.0–149.0)
VLDL: 16 mg/dL (ref 0.0–40.0)

## 2021-11-02 LAB — COMPREHENSIVE METABOLIC PANEL
ALT: 10 U/L (ref 0–35)
AST: 19 U/L (ref 0–37)
Albumin: 4.2 g/dL (ref 3.5–5.2)
Alkaline Phosphatase: 80 U/L (ref 39–117)
BUN: 19 mg/dL (ref 6–23)
CO2: 27 mEq/L (ref 19–32)
Calcium: 9 mg/dL (ref 8.4–10.5)
Chloride: 104 mEq/L (ref 96–112)
Creatinine, Ser: 0.68 mg/dL (ref 0.40–1.20)
GFR: 86.39 mL/min (ref >60.00)
Glucose, Bld: 97 mg/dL (ref 70–99)
Potassium: 4.3 mEq/L (ref 3.5–5.1)
Sodium: 140 mEq/L (ref 135–145)
Total Bilirubin: 0.5 mg/dL (ref 0.2–1.2)
Total Protein: 6.1 g/dL (ref 6.0–8.3)

## 2021-11-02 LAB — HEMOGLOBIN A1C: Hgb A1c MFr Bld: 5.8 % (ref 4.6–6.5)

## 2021-11-02 MED ORDER — SIMVASTATIN 20 MG PO TABS: ORAL_TABLET | ORAL | 11 refills | Status: DC

## 2021-11-02 MED ORDER — METFORMIN HCL 500 MG PO TABS: 500.0000 mg | ORAL_TABLET | Freq: Every day | ORAL | 3 refills | Status: DC

## 2021-11-02 NOTE — Assessment & Plan Note (Signed)
Well controlled, no changes to meds. Encouraged heart healthy diet such as the DASH diet and exercise as tolerated.  °

## 2021-11-02 NOTE — Assessment & Plan Note (Signed)
Encourage heart healthy diet such as MIND or DASH diet, increase exercise, avoid trans fats, simple carbohydrates and processed foods, consider a krill or fish or flaxseed oil cap daily.  °

## 2021-11-02 NOTE — Patient Instructions (Signed)

## 2021-11-02 NOTE — Assessment & Plan Note (Signed)
Check labs con't synthroid 

## 2021-11-02 NOTE — Progress Notes (Signed)
? ?Subjective:  ? ?By signing my name below, I, Zite Okoli, attest that this documentation has been prepared under the direction and in the presence of [name], DO. 11/02/2021  ? ? Patient ID: [name] [name], female    DOB: 04-Mar-1946, 76 y.o.   MRN: [id number] ? ?Chief Complaint  ?Patient presents with  ? Numbness  ?  X3 months, tips of her fingers and some pain.   ? ? ?HPI ?Patient is in today for an office visit. ? ?She would like to see Dr Brett Fairy for numbness in her fingers and memory testing. The numbness is in her right hand and it started from her thumb and spread to the rest of the fingers. She notes they are worse at night  and is often able to shake it off during the day.  ? ?She reports her eyes have not been feeling right and think they are dry. She went to see an ophthalmologist and he does not think they are dry. She would like to have lab work drawn. Last labs were in November.  ? ?She will be having a right knee arthroplasty in September 09/18. She has been going to water therapy and is very satisfied with the results. ? ?Past Medical History:  ?Diagnosis Date  ? Allergic rhinitis   ? Arthritis   ? Cataract   ? Depression   ? Diabetes mellitus without complication ([place])   ? Emphysema lung ([place])   ? Early stage  ? Endometrial polyp   ? Hyperlipidemia   ? Hyperlipidemia   ? on simvastatin  ? Hypothyroidism   ? IBS (irritable bowel syndrome)   ? Obesity   ? Osteoarthritis   ? Seasonal allergies   ? ? ?Past Surgical History:  ?Procedure Laterality Date  ? BUNIONECTOMY  2009  ? Dr.Duda, left  ? EYE SURGERY    ? HYSTEROSCOPY WITH D & C  04/24/2011  ? Procedure: DILATATION AND CURETTAGE (D&C) /HYSTEROSCOPY;  Surgeon: Eldred Manges, MD;  Location: [facility];  Service: Gynecology;  Laterality: N/A;  ? 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 Dr Redmond Baseman  ? [place]  ? Dr Percell Miller  ? POLYPECTOMY  04/24/2011  ? Procedure: POLYPECTOMY;  Surgeon: Eldred Manges, MD;  Location: [facility];  Service: Gynecology;  Laterality: N/A;  ? ? ?Family History  ?Problem Relation Age of Onset  ? Colon cancer Father 62  ? Cancer Father 43  ?     colon  ? Alcoholism Father   ? Pancreatic cancer Mother 3  ? Cancer Mother 3  ?     pancreatic  ? Colon cancer Paternal Grandmother   ? Cancer Paternal Grandmother 27  ?     colon  ? Heart attack Maternal Grandmother 50  ? Heart disease Maternal Grandmother   ? Cancer Sister 69  ?     melanoma  ? Breast cancer Sister   ? Cancer Brother 41  ?     skin  ? Thyroid disease Sister   ? Breast cancer Maternal Aunt 30  ? Heart attack Maternal Aunt 60  ? Breast cancer Sister   ? ? ?Social History  ? ?Socioeconomic History  ? Marital status: Married  ?  Spouse name: Rovena Hearld  ? Number of children: 0  ? Years of education: Not on file  ? Highest education level: Master's degree (e.g., MA, MS, MEng, MEd, MSW, MBA)  ?Occupational History  ?  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 Smoking status: Former  ?  Packs/day: 2.00  ?  Years: 42.00  ?  Pack years: 84.00  ?  Types: Cigarettes  ?  Quit date: 07/28/2012  ?  Years since quitting: 9.2  ? Smokeless tobacco: Never  ?Vaping Use  ? Vaping Use: Former  ?Substance and Sexual Activity  ? Alcohol use: Yes  ?  Comment: rare  ? Drug use: Never  ? Sexual activity: Yes  ?  Partners: Male  ?  Birth control/protection: Post-menopausal  ?Other Topics Concern  ? Not on file  ?Social History Narrative  ? Regular exercise- no   ? Lives with spouse  ? Caffeine- 2 teas daily  ? ?Social Determinants of Health  ? ?Financial Resource Strain: Not on file  ?Food Insecurity: Not on file  ?Transportation Needs: Not on file  ?Physical Activity: Not on file  ?Stress: Not on file  ?Social Connections: Not on file  ?Intimate Partner Violence: Not on file  ? ? ?Outpatient Medications Prior to Visit  ?Medication Sig Dispense Refill  ? ALPRAZolam (XANAX) 0.25 MG tablet Take 1 tablet (0.25 mg total) by mouth 2 (two) times daily as needed  for anxiety. 20 tablet 0  ? levothyroxine (SYNTHROID) 125 MCG tablet TAKE 1 TABLET BY MOUTH  DAILY BEFORE BREAKFAST 90 tablet 1  ? loperamide (IMODIUM) 2 MG capsule TAKE 1 CAPSULE BY MOUTH  TWICE DAILY 180 capsule 1  ? multivitamin (THERAGRAN) per tablet Take 1 tablet by mouth daily.      ? sertraline (ZOLOFT) 100 MG tablet Take 1 tablet (100 mg total) by mouth daily. 90 tablet 1  ? Vitamin D, Ergocalciferol, (DRISDOL) 1.25 MG (50000 UNIT) CAPS capsule TAKE 1 CAPSULE BY MOUTH  EVERY 7 DAYS 13 capsule 3  ? metFORMIN (GLUCOPHAGE) 500 MG tablet TAKE 1 TABLET BY MOUTH DAILY  WITH BREAKFAST 90 tablet 3  ? simvastatin (ZOCOR) 20 MG tablet TAKE 1 TABLET BY MOUTH AT  BEDTIME (NEED OFFICE VISIT  FOR FURTHER REFILLS) 30 tablet 11  ? ?No facility-administered medications prior to visit.  ? ? ?No Known Allergies ? ?Review of Systems  ?Constitutional:  Negative for fever.  ?HENT:  Negative for congestion, ear pain, hearing loss, sinus pain and sore throat.   ?Eyes:  Negative for blurred vision and pain.  ?     (+) eye dryness  ?Respiratory:  Negative for cough, sputum production, shortness of breath and wheezing.   ?Cardiovascular:  Negative for chest pain and palpitations.  ?Gastrointestinal:  Negative for blood in stool, constipation, diarrhea, nausea and vomiting.  ?Genitourinary:  Negative for dysuria, frequency, hematuria and urgency.  ?Musculoskeletal:  Negative for back pain, falls and myalgias.  ?Neurological:  Negative for dizziness, sensory change, loss of consciousness, weakness and headaches.  ?     (+) numbness in fingers  ?Endo/Heme/Allergies:  Negative for environmental allergies. Does not bruise/bleed easily.  ?Psychiatric/Behavioral:  Negative for depression and suicidal ideas. The patient is not nervous/anxious and does not have insomnia.   ? ?   ?Objective:  ?  ?Physical Exam ?Constitutional:   ?   General: She is not in acute distress. ?   Appearance: Normal appearance. She is not ill-appearing.  ?HENT:  ?    Head: Normocephalic and atraumatic.  ?   Right Ear: External ear normal.  ?   Left Ear: External ear normal.  ?Eyes:  ?   Extraocular Movements: Extraocular  movements intact.  ?   Pupils: Pupils are equal, round, and reactive to light.  ?Cardiovascular:  ?   Rate and Rhythm: Normal rate and regular rhythm.  ?   Pulses: Normal pulses.  ?   Heart sounds: Normal heart sounds. No murmur heard. ?  No gallop.  ?Pulmonary:  ?   Effort: Pulmonary effort is normal. No respiratory distress.  ?   Breath sounds: Normal breath sounds. No wheezing, rhonchi or rales.  ?Abdominal:  ?   General: Bowel sounds are normal. There is no distension.  ?   Palpations: Abdomen is soft. There is no mass.  ?   Tenderness: There is no abdominal tenderness. There is no guarding or rebound.  ?   Hernia: No hernia is present.  ?Musculoskeletal:  ?   Cervical back: Normal range of motion and neck supple.  ?Lymphadenopathy:  ?   Cervical: No cervical adenopathy.  ?Skin: ?   General: Skin is warm and dry.  ?Neurological:  ?   Mental Status: She is alert and oriented to person, place, and time.  ?Psychiatric:     ?   Behavior: Behavior normal.  ? ? ?BP 110/68 (BP Location: Right Arm, Patient Position: Sitting, Cuff Size: Large)   Pulse 71   Temp 97.9 ?F (36.6 ?C) (Oral)   Resp 18   Ht 5' 0.5" (1.537 m)   Wt 199 lb 12.8 oz (90.6 kg)   SpO2 97%   BMI 38.38 kg/m?  ?Wt Readings from Last 3 Encounters:  ?11/02/21 199 lb 12.8 oz (90.6 kg)  ?05/18/21 230 lb 6.4 oz (104.5 kg)  ?03/08/21 232 lb (105.2 kg)  ? ? ?Diabetic Foot Exam - Simple   ?No data filed ?  ? ?Lab Results  ?Component Value Date  ? WBC 4.5 05/24/2021  ? HGB 13.5 05/24/2021  ? HCT 41.3 05/24/2021  ? PLT 173.0 05/24/2021  ? GLUCOSE 90 05/18/2021  ? CHOL 181 03/08/2021  ? TRIG 72.0 03/08/2021  ? HDL 76.90 03/08/2021  ? LDLDIRECT 129.1 06/13/2011  ? [place] 90 03/08/2021  ? ALT 13 05/18/2021  ? AST 18 05/18/2021  ? NA 141 05/18/2021  ? K 4.1 05/18/2021  ? CL 104 05/18/2021  ? CREATININE  0.66 05/18/2021  ? BUN 17 05/18/2021  ? CO2 29 05/18/2021  ? TSH 1.26 05/18/2021  ? HGBA1C 5.7 03/08/2021  ? MICROALBUR 0.2 02/01/2013  ? ? ?Lab Results  ?Component Value Date  ? TSH 1.26 05/18/2021  ? ?Lab Res

## 2021-11-02 NOTE — Assessment & Plan Note (Signed)
?  CTS' ?Wrist splint given to pt and she will f/u neuro ?

## 2021-11-03 LAB — THYROID PANEL WITH TSH
Free Thyroxine Index: 3.4 (ref 1.4–3.8)
T3 Uptake: 33 % (ref 22–35)
T4, Total: 10.3 ug/dL (ref 5.1–11.9)
TSH: 2.64 mIU/L (ref 0.40–4.50)

## 2021-11-06 ENCOUNTER — Other Ambulatory Visit: Payer: Self-pay | Admitting: Family Medicine

## 2021-11-06 DIAGNOSIS — R200 Anesthesia of skin: Secondary | ICD-10-CM

## 2021-11-15 ENCOUNTER — Encounter: Payer: Self-pay | Admitting: Orthopedic Surgery

## 2021-11-15 ENCOUNTER — Ambulatory Visit: Payer: 59 | Admitting: Orthopedic Surgery

## 2021-11-15 ENCOUNTER — Telehealth: Payer: Self-pay

## 2021-11-15 DIAGNOSIS — G5601 Carpal tunnel syndrome, right upper limb: Secondary | ICD-10-CM

## 2021-11-15 NOTE — Progress Notes (Addendum)
? ?Office Visit Note ?  ?Patient: [name] [name]           ?Date of Birth: 1946-03-17           ?MRN: [id number] ?Visit Date: 11/15/2021 ?             ?Requested by: Carollee Herter, Kendrick Fries R, DO ?[address] ?[address] ?[place],  [place] [address] ?PCP: [name], DO ? ? ?Assessment & Plan: ?Visit Diagnoses:  ?1. Carpal tunnel syndrome, right upper limb   ? ? ?Plan: Patient's history and exam findings seem most consistent with carpal tunnel syndrome.  She has 21/26 points on the CTS-6 evaluation tool.  We reviewed the nature of carpal tunnel syndrome as well as diagnosis, prognosis, and both conservative and surgical treatment options.  She has worn night braces which have significantly improved her nocturnal symptoms but her daytime symptoms are still present and worsening.  After our discussion, she would like to proceed with carpal tunnel release.  We reviewed the nature of the surgery as well as its risks including, but not limited to, bleeding, infection, damage to neurovascular structures, incomplete symptom relief, need for additional procedures.  We discussed the expected postoperative course.  She would like to proceed.  A date and time will be confirmed with the patient. ? ?Follow-Up Instructions: No follow-ups on file.  ? ?Orders:  ?No orders of the defined types were placed in this encounter. ? ?No orders of the defined types were placed in this encounter. ? ? ? ? Procedures: ?No procedures performed ? ? ?Clinical Data: ?No additional findings. ? ? ?Subjective: ?Chief Complaint  ?Patient presents with  ? Right Hand - New Patient (Initial Visit)  ? ? ?This is a 76 year old right-hand-dominant female who presents with numbness and paresthesias involving the right hand.  Is been going on for about 3 months or so now.  She describes numbness and tingling in the thumb, index, middle, and radial half of the ring finger.  The small finger is never involved.  She says that the fingers will feel  "fuzzy."  She previously was having nocturnal symptoms 7 nights per week.  She has been wearing a brace on the right wrist for a few weeks with improvement in her nocturnal symptoms.  She continues to have intermittent numbness and paresthesias during the day.  She reports that these fingers are numb and tingly currently in the office.  She denies any noticeable weakness in her hand. ? ?She has never had an EMG/nerve conduction study.  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  She has no history of inflammatory arthropathy, wrist trauma, or cervical spine issue. ? ? ?Review of Systems ? ? ?Objective: ?Vital Signs: There were no vitals taken for this visit. ? ?Physical Exam ?Constitutional:   ?   Appearance: Normal appearance.  ?Cardiovascular:  ?   Rate and Rhythm: Normal rate.  ?   Pulses: Normal pulses.  ?Pulmonary:  ?   Effort: Pulmonary effort is normal.  ?Skin: ?   General: Skin is warm and dry.  ?   Capillary Refill: Capillary refill takes less than 2 seconds.  ?Neurological:  ?   Mental Status: She is alert.  ? ? ?Right Hand Exam  ? ?Tenderness  ?The patient is experiencing no tenderness.  ? ?Range of Motion  ?The patient has normal right wrist ROM.  ? ?Other  ?Erythema: absent ?Sensation: decreased ?Pulse: present ? ?Comments:  Positive Tinel sign.  Positive Phalen sign with worsening numbness of the thumb, index, and middle fingers.  Equivocal Durkan sign given fingers already numb.  5/5 thenar motor strength without atrophy.  2PD is 56m in the thumb and 581min remaining digits.  2PD is 55m46mhroughout c/l hand.  ? ? ? ? ?Specialty Comments:  ?No specialty comments available. ? ?Imaging: ?No results found. ? ? ?PMFS History: ?Patient Active Problem List  ? Diagnosis Date Noted  ? Carpal tunnel syndrome, right upper limb 11/15/2021  ? Numbness 11/02/2021  ? OSA (obstructive sleep apnea) 07/24/2020  ? 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 Upper airway cough syndrome  06/13/2020  ? Class 3 severe obesity due to excess calories with serious comorbidity and body mass index (BMI) of 45.0 to 49.9 in adult ([facility]1/30/2021  ? MCI (mild cognitive impairment) 06/13/2020  ? Insulin resistance 05/09/2020  ? Insomnia 05/09/2020  ? Vitamin D deficiency 02/17/2019  ? Diarrhea 02/17/2019  ? Essential hypertension 02/17/2019  ? Class 3 severe obesity due to excess calories with serious comorbidity and body mass index (BMI) of 40.0 to 44.9 in adult ([facility]8/[facility]/2018  ? Hair loss 02/17/2019  ? Anxiety 11/30/2017  ? Primary osteoarthritis of both knees 11/30/2017  ? Abnormal WBC count 11/30/2017  ? Obesity (BMI 30-39.9) 07/23/2013  ? Suspicious mole 02/01/2013  ? Palpitations 07/23/2012  ? Hyperlipidemia 01/28/2012  ? Morbid obesity (HCCFountain [facility]0/04/2011  ? Preventative health care 02/20/2011  ? Skin lump of leg 10/10/2010  ? SORE THROAT 08/15/2010  ? LYMPHADENOPATHY 08/15/2010  ? TOBACCO USE 01/17/2010  ? Hyperlipidemia associated with type 2 diabetes mellitus (HCC[facility]ine[facility]9/22/2009  ? TINNITUS, CHRONIC, 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  ? ALLERGIC RHINITIS 01/25/2008  ? LIVER FUNCTION TESTS, ABNORMAL 01/25/2008  ? Hypothyroidism 01/13/2007  ? Depression with anxiety 01/13/2007  ? TRANSIENT GLOBAL AMNESIA 01/13/2007  ? FOOT SURGERY, HX OF 01/13/2007  ? ?Past Medical History:  ?Diagnosis Date  ? Allergic rhinitis   ? Arthritis   ? Cataract   ? Depression   ? Diabetes mellitus without complication (HCC[place] ? Emphysema lung (HCCEddy[place] ? Early stage  ? Endometrial polyp   ? Hyperlipidemia   ? Hyperlipidemia   ? on simvastatin  ? Hypothyroidism   ? IBS (irritable bowel syndrome)   ? Obesity   ? Osteoarthritis   ? Seasonal allergies   ?  ?Family History  ?Problem Relation Age of Onset  ? Colon cancer Father 45 80 Cancer Father 45 94     colon  ? Alcoholism Father   ? Pancreatic cancer Mother 71 58 Cancer Mother 69 75     pancreatic  ? Colon cancer Paternal Grandmother   ? Cancer Paternal Grandmother 58 68     colon  ? Heart attack  Maternal Grandmother 50  ? Heart disease Maternal Grandmother   ? Cancer Sister 62 48     melanoma  ? Breast cancer Sister   ? Cancer Brother 58 63     skin  ? Thyroid disease Sister   ? Breast cancer Maternal Aunt 30  ? Heart attack Maternal Aunt 60  ? Breast cancer Sister   ?  ?Past Surgical History:  ?Procedure Laterality Date  ? BUNIONECTOMY  2009  ? Dr.Duda, left  ? EYE SURGERY    ? HYSTEROSCOPY WITH D & C  04/24/2011  ? Procedure: DILATATION AND CURETTAGE (D&C) /HYSTEROSCOPY;  Surgeon: VanEldred Manges  MD;  Location: [facility];  Service: Gynecology;  Laterality: N/A;  ? 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 Dr Redmond Baseman  ? [place]  ? Dr Percell Miller  ? POLYPECTOMY  04/24/2011  ? Procedure: POLYPECTOMY;  Surgeon: Eldred Manges, MD;  Location: [facility];  Service: Gynecology;  Laterality: N/A;  ? ?Social History  ? ?Occupational History  ? 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 Smoking status: Former  ?  Packs/day: 2.00  ?  Years: 42.00  ?  Pack years: 84.00  ?  Types: Cigarettes  ?  Quit date: 07/28/2012  ?  Years since quitting: 9.3  ? Smokeless tobacco: Never  ?Vaping Use  ? Vaping Use: Former  ?Substance and Sexual Activity  ? Alcohol use: Yes  ?  Comment: rare  ? Drug use: Never  ? Sexual activity: Yes  ?  Partners: Male  ?  Birth control/protection: Post-menopausal  ? ? ? ? ? ? ?

## 2021-11-15 NOTE — Telephone Encounter (Signed)
Referral was sent from Neuro dept to sleep - pt does not need sleep consultation, just a follow up office visit per Dr. Brett Fairy. Could someone please assist with getting this patient an appointment to discuss CPAP, sleep issues and how they could be affecting her memory concerns. Thank you!  ?

## 2021-11-15 NOTE — Telephone Encounter (Addendum)
LVM for pt to call office to schedule appt with Dr. Brett Fairy. ?

## 2021-11-15 NOTE — H&P (View-Only) (Signed)
? ?Office Visit Note ?  ?Patient: [name] [name]           ?Date of Birth: Dec 21, 1945           ?MRN: [id number] ?Visit Date: 11/15/2021 ?             ?Requested by: Carollee Herter, Kendrick Fries R, DO ?[address] ?[address] ?[place],  [place] [address] ?PCP: [name], DO ? ? ?Assessment & Plan: ?Visit Diagnoses:  ?1. Carpal tunnel syndrome, right upper limb   ? ? ?Plan: Patient's history and exam findings seem most consistent with carpal tunnel syndrome.  She has 21/26 points on the CTS-6 evaluation tool.  We reviewed the nature of carpal tunnel syndrome as well as diagnosis, prognosis, and both conservative and surgical treatment options.  She has worn night braces which have significantly improved her nocturnal symptoms but her daytime symptoms are still present and worsening.  After our discussion, she would like to proceed with carpal tunnel release.  We reviewed the nature of the surgery as well as its risks including, but not limited to, bleeding, infection, damage to neurovascular structures, incomplete symptom relief, need for additional procedures.  We discussed the expected postoperative course.  She would like to proceed.  A date and time will be confirmed with the patient. ? ?Follow-Up Instructions: No follow-ups on file.  ? ?Orders:  ?No orders of the defined types were placed in this encounter. ? ?No orders of the defined types were placed in this encounter. ? ? ? ? Procedures: ?No procedures performed ? ? ?Clinical Data: ?No additional findings. ? ? ?Subjective: ?Chief Complaint  ?Patient presents with  ? Right Hand - New Patient (Initial Visit)  ? ? ?This is a 76 year old right-hand-dominant female who presents with numbness and paresthesias involving the right hand.  Is been going on for about 3 months or so now.  She describes numbness and tingling in the thumb, index, middle, and radial half of the ring finger.  The small finger is never involved.  She says that the fingers will feel  "fuzzy."  She previously was having nocturnal symptoms 7 nights per week.  She has been wearing a brace on the right wrist for a few weeks with improvement in her nocturnal symptoms.  She continues to have intermittent numbness and paresthesias during the day.  She reports that these fingers are numb and tingly currently in the office.  She denies any noticeable weakness in her hand. ? ?She has never had an EMG/nerve conduction study.  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  She has no history of inflammatory arthropathy, wrist trauma, or cervical spine issue. ? ? ?Review of Systems ? ? ?Objective: ?Vital Signs: There were no vitals taken for this visit. ? ?Physical Exam ?Constitutional:   ?   Appearance: Normal appearance.  ?Cardiovascular:  ?   Rate and Rhythm: Normal rate.  ?   Pulses: Normal pulses.  ?Pulmonary:  ?   Effort: Pulmonary effort is normal.  ?Skin: ?   General: Skin is warm and dry.  ?   Capillary Refill: Capillary refill takes less than 2 seconds.  ?Neurological:  ?   Mental Status: She is alert.  ? ? ?Right Hand Exam  ? ?Tenderness  ?The patient is experiencing no tenderness.  ? ?Range of Motion  ?The patient has normal right wrist ROM.  ? ?Other  ?Erythema: absent ?Sensation: decreased ?Pulse: present ? ?Comments:  Positive Tinel sign.  Positive Phalen sign with worsening numbness of the thumb, index, and middle fingers.  Equivocal Durkan sign given fingers already numb.  5/5 thenar motor strength without atrophy.  2PD is 82m in the thumb and 54min remaining digits.  2PD is 19m28mhroughout c/l hand.  ? ? ? ? ?Specialty Comments:  ?No specialty comments available. ? ?Imaging: ?No results found. ? ? ?PMFS History: ?Patient Active Problem List  ? Diagnosis Date Noted  ? Carpal tunnel syndrome, right upper limb 11/15/2021  ? Numbness 11/02/2021  ? OSA (obstructive sleep apnea) 07/24/2020  ? 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 Upper airway cough syndrome  06/13/2020  ? Class 3 severe obesity due to excess calories with serious comorbidity and body mass index (BMI) of 45.0 to 49.9 in adult ([facility]1/30/2021  ? MCI (mild cognitive impairment) 06/13/2020  ? Insulin resistance 05/09/2020  ? Insomnia 05/09/2020  ? Vitamin D deficiency 02/17/2019  ? Diarrhea 02/17/2019  ? Essential hypertension 02/17/2019  ? Class 3 severe obesity due to excess calories with serious comorbidity and body mass index (BMI) of 40.0 to 44.9 in adult (HC[facility]8/11/2018  ? Hair loss 02/17/2019  ? Anxiety 11/30/2017  ? Primary osteoarthritis of both knees 11/30/2017  ? Abnormal WBC count 11/30/2017  ? Obesity (BMI 30-39.9) 07/23/2013  ? Suspicious mole 02/01/2013  ? Palpitations 07/23/2012  ? Hyperlipidemia 01/28/2012  ? Morbid obesity (HCCWeeping Water0/04/2011  ? Preventative health care 02/20/2011  ? Skin lump of leg 10/10/2010  ? SORE THROAT 08/15/2010  ? LYMPHADENOPATHY 08/15/2010  ? TOBACCO USE 01/17/2010  ? Hyperlipidemia associated with type 2 diabetes mellitus (HCCPurdin9/22/2009  ? TINNITUS, CHRONIC, 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  ? ALLERGIC RHINITIS 01/25/2008  ? LIVER FUNCTION TESTS, ABNORMAL 01/25/2008  ? Hypothyroidism 01/13/2007  ? Depression with anxiety 01/13/2007  ? TRANSIENT GLOBAL AMNESIA 01/13/2007  ? FOOT SURGERY, HX OF 01/13/2007  ? ?Past Medical History:  ?Diagnosis Date  ? Allergic rhinitis   ? Arthritis   ? Cataract   ? Depression   ? Diabetes mellitus without complication (HCCHaw[place] ? Emphysema lung (HCCRedland ? Early stage  ? Endometrial polyp   ? Hyperlipidemia   ? Hyperlipidemia   ? on simvastatin  ? Hypothyroidism   ? IBS (irritable bowel syndrome)   ? Obesity   ? Osteoarthritis   ? Seasonal allergies   ?  ?Family History  ?Problem Relation Age of Onset  ? Colon cancer Father 45 11 Cancer Father 45 28     colon  ? Alcoholism Father   ? Pancreatic cancer Mother 71 88 Cancer Mother 69 64     pancreatic  ? Colon cancer Paternal Grandmother   ? Cancer Paternal Grandmother 58 95     colon  ? Heart attack  Maternal Grandmother 50  ? Heart disease Maternal Grandmother   ? Cancer Sister 62 8     melanoma  ? Breast cancer Sister   ? Cancer Brother 58 1     skin  ? Thyroid disease Sister   ? Breast cancer Maternal Aunt 30  ? Heart attack Maternal Aunt 60  ? Breast cancer Sister   ?  ?Past Surgical History:  ?Procedure Laterality Date  ? BUNIONECTOMY  2009  ? Dr.Duda, left  ? EYE SURGERY    ? HYSTEROSCOPY WITH D & C  04/24/2011  ? Procedure: DILATATION AND CURETTAGE (D&C) /HYSTEROSCOPY;  Surgeon: VanEldred Manges  MD;  Location: [facility];  Service: Gynecology;  Laterality: N/A;  ? 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 Dr Redmond Baseman  ? [place]  ? Dr Percell Miller  ? POLYPECTOMY  04/24/2011  ? Procedure: POLYPECTOMY;  Surgeon: Eldred Manges, MD;  Location: [facility];  Service: Gynecology;  Laterality: N/A;  ? ?Social History  ? ?Occupational History  ? 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 Smoking status: Former  ?  Packs/day: 2.00  ?  Years: 42.00  ?  Pack years: 84.00  ?  Types: Cigarettes  ?  Quit date: 07/28/2012  ?  Years since quitting: 9.3  ? Smokeless tobacco: Never  ?Vaping Use  ? Vaping Use: Former  ?Substance and Sexual Activity  ? Alcohol use: Yes  ?  Comment: rare  ? Drug use: Never  ? Sexual activity: Yes  ?  Partners: Male  ?  Birth control/protection: Post-menopausal  ? ? ? ? ? ? ?

## 2021-11-16 ENCOUNTER — Telehealth: Payer: Self-pay | Admitting: Orthopedic Surgery

## 2021-11-16 NOTE — Telephone Encounter (Signed)
Patient was looking over her after visit summary, and noticed her history states she does not have Hypothyroidism. Per patient she does have Hypothyroidism, and she takes medicine for this. Medication is listed on her chart. Patient request that you add that she does have Hypothyroidism to her chart.  ?

## 2021-11-17 ENCOUNTER — Encounter: Payer: Self-pay | Admitting: Family Medicine

## 2021-11-19 ENCOUNTER — Telehealth: Payer: Self-pay | Admitting: Neurology

## 2021-11-19 ENCOUNTER — Other Ambulatory Visit: Payer: Self-pay

## 2021-11-19 ENCOUNTER — Encounter (HOSPITAL_BASED_OUTPATIENT_CLINIC_OR_DEPARTMENT_OTHER): Payer: Self-pay | Admitting: Orthopedic Surgery

## 2021-11-19 NOTE — Telephone Encounter (Signed)
Pt states she had a missed call from [facility] to schedule a memory test.  ?Pt would like a call back to schedule/discuss.  ?

## 2021-11-19 NOTE — Telephone Encounter (Signed)
Called and scheduled appt with Dr. Brett Fairy on 01/09/22 at 2:30pm. Added appt to wait list. Pt aware she will be notified via mychart if any sooner appt open up. ?

## 2021-11-19 NOTE — Telephone Encounter (Addendum)
Message from [place] (sleep lab) 11/15/21: "Referral was sent from Neuro dept to sleep - pt does not need sleep consultation, just a follow up office visit per Dr. Brett Fairy. Could someone please assist with getting this patient an appointment to discuss CPAP, sleep issues and how they could be affecting her memory concerns. Thank you! " ? ?I called and LVM for pt to call and schedule appt with Dr. Brett Fairy 11/15/21.  ?

## 2021-11-27 ENCOUNTER — Telehealth: Payer: Self-pay | Admitting: Orthopedic Surgery

## 2021-11-27 NOTE — Telephone Encounter (Signed)
Patient called advised she is having surgery in the morning and need to know if she take medication Levothyroxine and Sertraline? The number to contact patient is [phone] ?

## 2021-11-27 NOTE — Telephone Encounter (Signed)
Please advise 

## 2021-11-28 ENCOUNTER — Encounter (HOSPITAL_BASED_OUTPATIENT_CLINIC_OR_DEPARTMENT_OTHER)
Admission: RE | Disposition: A | Discharge status: Home / Self Care | Payer: Self-pay | Source: Home / Self Care | Attending: Orthopedic Surgery

## 2021-11-28 ENCOUNTER — Ambulatory Visit (HOSPITAL_BASED_OUTPATIENT_CLINIC_OR_DEPARTMENT_OTHER): Payer: 59 | Admitting: Certified Registered"

## 2021-11-28 ENCOUNTER — Ambulatory Visit (HOSPITAL_BASED_OUTPATIENT_CLINIC_OR_DEPARTMENT_OTHER)
Admission: RE | Admit: 2021-11-28 | Discharge: 2021-11-28 | Disposition: A | Discharge status: Home / Self Care | Payer: 59 | Attending: Orthopedic Surgery | Admitting: Orthopedic Surgery

## 2021-11-28 ENCOUNTER — Encounter (HOSPITAL_BASED_OUTPATIENT_CLINIC_OR_DEPARTMENT_OTHER): Payer: Self-pay | Admitting: Orthopedic Surgery

## 2021-11-28 ENCOUNTER — Other Ambulatory Visit: Payer: Self-pay

## 2021-11-28 DIAGNOSIS — E119 Type 2 diabetes mellitus without complications: Secondary | ICD-10-CM | POA: Diagnosis not present

## 2021-11-28 DIAGNOSIS — F419 Anxiety disorder, unspecified: Secondary | ICD-10-CM | POA: Insufficient documentation

## 2021-11-28 DIAGNOSIS — Z87891 Personal history of nicotine dependence: Secondary | ICD-10-CM | POA: Insufficient documentation

## 2021-11-28 DIAGNOSIS — E039 Hypothyroidism, unspecified: Secondary | ICD-10-CM | POA: Diagnosis not present

## 2021-11-28 DIAGNOSIS — G5601 Carpal tunnel syndrome, right upper limb: Secondary | ICD-10-CM | POA: Diagnosis not present

## 2021-11-28 DIAGNOSIS — Z7984 Long term (current) use of oral hypoglycemic drugs: Secondary | ICD-10-CM | POA: Insufficient documentation

## 2021-11-28 DIAGNOSIS — F32A Depression, unspecified: Secondary | ICD-10-CM | POA: Diagnosis not present

## 2021-11-28 HISTORY — PX: CARPAL TUNNEL RELEASE: SHX101

## 2021-11-28 HISTORY — DX: Sleep apnea, unspecified: G47.30

## 2021-11-28 LAB — GLUCOSE, CAPILLARY
Glucose-Capillary: 100 mg/dL — ABNORMAL HIGH (ref 70–99)
Glucose-Capillary: 84 mg/dL (ref 70–99)

## 2021-11-28 SURGERY — CARPAL TUNNEL RELEASE
Anesthesia: Regional | Site: Hand | Laterality: Right

## 2021-11-28 MED ORDER — LACTATED RINGERS IV SOLN: INTRAVENOUS | Status: DC

## 2021-11-28 MED ORDER — PROPOFOL 10 MG/ML IV BOLUS
INTRAVENOUS | Status: AC
  Filled 2021-11-28: qty 20

## 2021-11-28 MED ORDER — LIDOCAINE 2% (20 MG/ML) 5 ML SYRINGE
INTRAMUSCULAR | Status: AC
  Filled 2021-11-28: qty 5

## 2021-11-28 MED ORDER — LIDOCAINE HCL (PF) 1 % IJ SOLN
INTRAMUSCULAR | Status: AC
  Filled 2021-11-28: qty 30

## 2021-11-28 MED ORDER — LIDOCAINE-EPINEPHRINE (PF) 1 %-1:200000 IJ SOLN
INTRAMUSCULAR | Status: AC
  Filled 2021-11-28: qty 30

## 2021-11-28 MED ORDER — PROPOFOL 500 MG/50ML IV EMUL
INTRAVENOUS | Status: DC | PRN
Start: 2021-11-28 — End: 2021-11-28
  Administered 2021-11-28: 200 ug/kg/min via INTRAVENOUS

## 2021-11-28 MED ORDER — LIDOCAINE 2% (20 MG/ML) 5 ML SYRINGE
INTRAMUSCULAR | Status: DC | PRN
  Administered 2021-11-28: 40 mg via INTRAVENOUS

## 2021-11-28 MED ORDER — FENTANYL CITRATE (PF) 100 MCG/2ML IJ SOLN
INTRAMUSCULAR | Status: DC | PRN
Start: 2021-11-28 — End: 2021-11-28
  Administered 2021-11-28 (×2): 25 ug via INTRAVENOUS
  Administered 2021-11-28: 50 ug via INTRAVENOUS

## 2021-11-28 MED ORDER — FENTANYL CITRATE (PF) 100 MCG/2ML IJ SOLN: 25.0000 ug | INTRAMUSCULAR | Status: DC | PRN

## 2021-11-28 MED ORDER — PROPOFOL 500 MG/50ML IV EMUL
INTRAVENOUS | Status: AC
  Filled 2021-11-28: qty 50

## 2021-11-28 MED ORDER — ONDANSETRON HCL 4 MG/2ML IJ SOLN: 4.0000 mg | Freq: Once | INTRAMUSCULAR | Status: DC | PRN

## 2021-11-28 MED ORDER — OXYCODONE HCL 5 MG/5ML PO SOLN: 5.0000 mg | Freq: Once | ORAL | Status: DC | PRN

## 2021-11-28 MED ORDER — OXYCODONE HCL 5 MG PO TABS: 5.0000 mg | ORAL_TABLET | Freq: Four times a day (QID) | ORAL | 0 refills | Status: AC | PRN

## 2021-11-28 MED ORDER — ONDANSETRON HCL 4 MG/2ML IJ SOLN
INTRAMUSCULAR | Status: DC | PRN
  Administered 2021-11-28: 4 mg via INTRAVENOUS

## 2021-11-28 MED ORDER — FENTANYL CITRATE (PF) 100 MCG/2ML IJ SOLN
INTRAMUSCULAR | Status: AC
Start: 2021-11-28 — End: ?
  Filled 2021-11-28: qty 2

## 2021-11-28 MED ORDER — BUPIVACAINE HCL (PF) 0.25 % IJ SOLN
INTRAMUSCULAR | Status: AC
  Filled 2021-11-28: qty 30

## 2021-11-28 MED ORDER — OXYCODONE HCL 5 MG PO TABS: 5.0000 mg | ORAL_TABLET | Freq: Once | ORAL | Status: DC | PRN

## 2021-11-28 MED ORDER — ACETAMINOPHEN 10 MG/ML IV SOLN: 1000.0000 mg | Freq: Once | INTRAVENOUS | Status: DC | PRN

## 2021-11-28 MED ORDER — ONDANSETRON HCL 4 MG/2ML IJ SOLN
INTRAMUSCULAR | Status: AC
  Filled 2021-11-28: qty 2

## 2021-11-28 MED ORDER — ACETAMINOPHEN 160 MG/5ML PO SOLN: 325.0000 mg | ORAL | Status: DC | PRN

## 2021-11-28 MED ORDER — ACETAMINOPHEN 325 MG PO TABS: 325.0000 mg | ORAL_TABLET | ORAL | Status: DC | PRN

## 2021-11-28 MED ORDER — MEPERIDINE HCL 25 MG/ML IJ SOLN: 6.2500 mg | INTRAMUSCULAR | Status: DC | PRN

## 2021-11-28 MED ORDER — DEXAMETHASONE SODIUM PHOSPHATE 10 MG/ML IJ SOLN
INTRAMUSCULAR | Status: AC
  Filled 2021-11-28: qty 1

## 2021-11-28 MED ORDER — 0.9 % SODIUM CHLORIDE (POUR BTL) OPTIME
TOPICAL | Status: DC | PRN
  Administered 2021-11-28: 30 mL

## 2021-11-28 MED ORDER — LIDOCAINE HCL (PF) 1 % IJ SOLN
INTRAMUSCULAR | Status: DC | PRN
Start: 2021-11-28 — End: 2021-11-28
  Administered 2021-11-28: 10 mL

## 2021-11-28 SURGICAL SUPPLY — 43 items
APL PRP STRL LF DISP 70% ISPRP (MISCELLANEOUS) ×1
BLADE SURG 15 STRL LF DISP TIS (BLADE) ×1 IMPLANT
BLADE SURG 15 STRL SS (BLADE) ×2
BNDG CMPR 9X4 STRL LF SNTH (GAUZE/BANDAGES/DRESSINGS) ×1
BNDG ELASTIC 3X5.8 VLCR STR LF (GAUZE/BANDAGES/DRESSINGS) ×2 IMPLANT
BNDG ESMARK 4X9 LF (GAUZE/BANDAGES/DRESSINGS) ×2 IMPLANT
BNDG GAUZE ELAST 4 BULKY (GAUZE/BANDAGES/DRESSINGS) ×2 IMPLANT
BNDG PLASTER X FAST 3X3 WHT LF (CAST SUPPLIES) IMPLANT
BNDG PLSTR 9X3 FST ST WHT (CAST SUPPLIES)
CHLORAPREP W/TINT 26 (MISCELLANEOUS) ×2 IMPLANT
CORD BIPOLAR FORCEPS 12FT (ELECTRODE) ×2 IMPLANT
COVER BACK TABLE 60X90IN (DRAPES) ×2 IMPLANT
COVER MAYO STAND STRL (DRAPES) ×2 IMPLANT
CUFF TOURN SGL QUICK 18X4 (TOURNIQUET CUFF) IMPLANT
CUFF TOURN SGL QUICK 24 (TOURNIQUET CUFF)
CUFF TRNQT CYL 24X4X16.5-23 (TOURNIQUET CUFF) IMPLANT
DRAPE EXTREMITY T 121X128X90 (DISPOSABLE) ×2 IMPLANT
DRAPE SURG 17X23 STRL (DRAPES) ×2 IMPLANT
DRAPE U-SHAPE 48X52 POLY STRL (PACKS) ×1 IMPLANT
GAUZE XEROFORM 1X8 LF (GAUZE/BANDAGES/DRESSINGS) ×1 IMPLANT
GLOVE BIO SURGEON STRL SZ7 (GLOVE) ×2 IMPLANT
GLOVE BIOGEL PI IND STRL 7.0 (GLOVE) ×1 IMPLANT
GLOVE BIOGEL PI INDICATOR 7.0 (GLOVE) ×1
GOWN STRL REUS W/ TWL LRG LVL3 (GOWN DISPOSABLE) ×1 IMPLANT
GOWN STRL REUS W/TWL LRG LVL3 (GOWN DISPOSABLE) ×2
GOWN STRL REUS W/TWL XL LVL3 (GOWN DISPOSABLE) ×2 IMPLANT
NDL HYPO 25X1 1.5 SAFETY (NEEDLE) IMPLANT
NEEDLE HYPO 25X1 1.5 SAFETY (NEEDLE) IMPLANT
NS IRRIG 1000ML POUR BTL (IV SOLUTION) ×2 IMPLANT
PACK BASIN DAY SURGERY FS (CUSTOM PROCEDURE TRAY) ×2 IMPLANT
PAD CAST 3X4 CTTN HI CHSV (CAST SUPPLIES) ×1 IMPLANT
PADDING CAST COTTON 3X4 STRL (CAST SUPPLIES) ×2
SLEEVE SCD COMPRESS KNEE MED (STOCKING) IMPLANT
SUCTION FRAZIER HANDLE 10FR (MISCELLANEOUS)
SUCTION TUBE FRAZIER 10FR DISP (MISCELLANEOUS) IMPLANT
SUT ETHILON 4 0 PS 2 18 (SUTURE) ×2 IMPLANT
SUT MNCRL AB 3-0 PS2 18 (SUTURE) IMPLANT
SUT VICRYL 4-0 PS2 18IN ABS (SUTURE) IMPLANT
SYR BULB EAR ULCER 3OZ GRN STR (SYRINGE) ×2 IMPLANT
SYR CONTROL 10ML LL (SYRINGE) IMPLANT
TOWEL GREEN STERILE FF (TOWEL DISPOSABLE) ×4 IMPLANT
TUBE CONNECTING 20X1/4 (TUBING) IMPLANT
UNDERPAD 30X36 HEAVY ABSORB (UNDERPADS AND DIAPERS) ×2 IMPLANT

## 2021-11-28 NOTE — Anesthesia Preprocedure Evaluation (Signed)
Anesthesia Evaluation  ?Patient identified by MRN, date of birth, ID band ?Patient awake ? ? ? ?Reviewed: ?Allergy & Precautions, NPO status , Patient's Chart, lab work & pertinent test results ? ?Airway ?Mallampati: II ? ? ? ? ? ? Dental ?no notable dental hx. ? ?  ?Pulmonary ?former smoker,  ?  ?Pulmonary exam normal ? ? ? ? ? ? ? Cardiovascular ?Normal cardiovascular exam ? ? ?  ?Neuro/Psych ?PSYCHIATRIC DISORDERS Anxiety Depression   ? GI/Hepatic ?negative GI ROS, Neg liver ROS,   ?Endo/Other  ?diabetes, Type 2, Oral Hypoglycemic AgentsHypothyroidism  ? Renal/GU ?negative Renal ROS  ?negative genitourinary ?  ?Musculoskeletal ? ? Abdominal ?(+) + obese,   ?Peds ? Hematology ?  ?Anesthesia Other Findings ? ? Reproductive/Obstetrics ? ?  ? ? ? ? ? ? ? ? ? ? ? ? ? ?  ?  ? ? ? ? ? ? ? ? ?Anesthesia Physical ?Anesthesia Plan ? ?ASA: 2 ? ?Anesthesia Plan: Consulting civil engineer Block-LIDOCAINE ONLY  ? ?Post-op Pain Management: Minimal or no pain anticipated  ? ?Induction:  ? ?PONV Risk Score and Plan: 2 and Ondansetron, Treatment may vary due to age or medical condition, Midazolam and Propofol infusion ? ?Airway Management Planned: Natural Airway and Simple Face Mask ? ?Additional Equipment: None ? ?Intra-op Plan:  ? ?Post-operative Plan:  ? ?Informed Consent: I have reviewed the patients History and Physical, chart, labs and discussed the procedure including the risks, benefits and alternatives for the proposed anesthesia with the patient or authorized representative who has indicated his/her understanding and acceptance.  ? ? ? ?Dental advisory given ? ?Plan Discussed with: CRNA ? ?Anesthesia Plan Comments:   ? ? ? ? ? ? ?Anesthesia Quick Evaluation ? ?

## 2021-11-28 NOTE — Op Note (Signed)
? ?  Date of Surgery: 11/28/2021 ? ?INDICATIONS: Patient is a 76 y.o.-year-old female with right carpal tunnel syndrome that has failed conservative management.  Risks, benefits, and alternatives to surgery were again discussed with the patient in the preoperative area. The patient wishes to proceed with surgery.  Informed consent was signed after our discussion.  ? ?PREOPERATIVE DIAGNOSIS:  ?Right carpal tunnel syndrome ? ?POSTOPERATIVE DIAGNOSIS: Same. ? ?PROCEDURE:  ?Right carpal tunnel release ? ? ?SURGEON: Audria Nine, M.D. ? ?ASSIST:  ? ?ANESTHESIA:  Local, MAC ? ?IV FLUIDS AND URINE: See anesthesia. ? ?ESTIMATED BLOOD LOSS: <5 mL. ? ?IMPLANTS: * No implants in log *  ? ?DRAINS: None ? ?COMPLICATIONS: None ? ?DESCRIPTION OF PROCEDURE: The patient was met in the preoperative holding area where the surgical site was marked and the consent form was verified.  The patient was then taken to the operating room and transferred to the operating table.  All bony prominences were well padded.  A tourniquet was applied to the right forearm forearm.  Monitored sedation was induced.   A formal time-out was performed to confirm that this was the correct patient, surgery, side, and site. A local block was performed using 10cc of 1% lidocaine. The operative extremity was prepped and draped in the usual and sterile fashion.   ? ?Following a second timeout, the limb was exsanguinated and the tourniquet inflated to 250 mmHg.  A longitudinal incision was made in line with the radial border of the ring finger from distal to the wrist flexion crease to the intersection of Kaplan's cardinal line.  The skin and subcutaneous tissue was sharply divided.  The longitudinally running palmar fascia was incised.  The thenar musculature was bluntly swept off of the transverse carpal ligament.  The ligament was divided from proximal to distal until the fat surrounding the palmar arch was encountered. Retractors were then placed in the  proximal aspect of the wound to visualize the distal antebrachial fascia.  The fascia was sharply divided under direct visualization.   The wound was then thoroughly irrigated with sterile saline.  The tourniquet was deflated.  Hemostasis was achieved with direct pressure and bipolar electrocautery.  The wound was then closed with 4-0 nylon sutures in a horizontal mattress fashion. The wound was then dressed with xeroform, folded kerlix, and an ace wrap. ? ?The patient was then reversed from anesthesia and transferred to the postoperative bed.  All counts were correct x 2 at the end of the procedure.  The patient was taken to the recovery unit in stable condition.    ? ? ?POSTOPERATIVE PLAN: She will be discharged to home with appropriate pain medication and discharge instructions.  I will see her in the office in 10-14 days for her first postop visit.  ? ?Audria Nine, MD ?9:05 AM  ?

## 2021-11-28 NOTE — Anesthesia Postprocedure Evaluation (Signed)
Anesthesia Post Note ? ?Patient: TARIA CASTRILLO ? ?Procedure(s) Performed: RIGHT CARPAL TUNNEL RELEASE (Right: Hand) ? ?  ? ?Patient location during evaluation: Phase II ?Anesthesia Type: Bier Block ?Level of consciousness: awake ?Pain management: pain level controlled ?Vital Signs Assessment: post-procedure vital signs reviewed and stable ?Respiratory status: spontaneous breathing ?Cardiovascular status: stable ?Postop Assessment: no apparent nausea or vomiting ?Anesthetic complications: no ? ? ?No notable events documented. ? ?Last Vitals:  ?Vitals:  ? 11/28/21 0915 11/28/21 0926  ?BP: (!) 115/57 123/60  ?Pulse: (!) 59 (!) 55  ?Resp: 15 13  ?Temp:    ?SpO2: 99% 96%  ?  ?Last Pain:  ?Vitals:  ? 11/28/21 0926  ?TempSrc:   ?PainSc: 0-No pain  ? ? ?  ?  ?  ?  ?  ?  ? ?Huston Foley ? ? ? ? ?

## 2021-11-28 NOTE — Discharge Instructions (Addendum)
 [name] [name], M.D. Hand Surgery  POST-OPERATIVE DISCHARGE INSTRUCTIONS   PRESCRIPTIONS: - You have been given a prescription to be taken as directed for post-operative pain control.  You may also take over the counter ibuprofen/aleve and tylenol for pain. Take this as directed on the packaging. Do not exceed 3000 mg tylenol/acetaminophen in 24 hours.  Ibuprofen 600-800 mg (3-4) tablets by mouth every 6 hours as needed for pain.   OR  Aleve 2 tablets by mouth every 12 hours (twice daily) as needed for pain.   AND/OR  Tylenol 1000 mg (2 tablets) every 8 hours as needed for pain.  - Please use your pain medication carefully, as refills are limited and you may not be provided with one.  As stated above, please use over the counter pain medicine - it will also be helpful with decreasing your swelling.    ANESTHESIA: -After your surgery, post-surgical discomfort or pain is likely. This discomfort can last several days to a few weeks. At certain times of the day your discomfort may be more intense.   Did you receive a nerve block?   - A nerve block can provide pain relief for one hour to two days after your surgery. As long as the nerve block is working, you will experience little or no sensation in the area the surgeon operated on.  - As the nerve block wears off, you will begin to experience pain or discomfort. It is very important that you begin taking your prescribed pain medication before the nerve block fully wears off. Treating your pain at the first sign of the block wearing off will ensure your pain is better controlled and more tolerable when full-sensation returns. Do not wait until the pain is intolerable, as the medicine will be less effective. It is better to treat pain in advance than to try and catch up.   General Anesthesia:  If you did not receive a nerve block during your surgery, you will need to start taking your pain medication shortly after your surgery and  should continue to do so as prescribed by your surgeon.     ICE AND ELEVATION: - You may use ice for the first 48-72 hours, but it is not critical.   - Motion of your fingers is very important to decrease the swelling.  - Elevation, as much as possible for the next 48 hours, is critical for decreasing swelling as well as for pain relief. Elevation means when you are seated or lying down, you hand should be at or above your heart. When walking, the hand needs to be at or above the level of your elbow.  - If the bandage gets too tight, it may need to be loosened. Please contact our office and we will instruct you in how to do this.    SURGICAL BANDAGES:  - Keep your dressing and/or splint clean and dry at all times.  You can remove your dressing 4 days from now and change with a dry dressing or Band-Aids as needed thereafter. - You may place a plastic bag over your bandage to shower, but be careful, do not get your bandages wet.  - After the bandages have been removed, it is OK to get the stitches wet in a shower or with hand washing. Do Not soak or submerge the wound yet. Please do not use lotions or creams on the stitches.      HAND THERAPY:  - You may not need any. If you do,   we will begin this at your follow up visit in the clinic.    ACTIVITY AND WORK: - You are encouraged to move any fingers which are not in the bandage.  - Light use of the fingers is allowed to assist the other hand with daily hygiene and eating, but strong gripping or lifting is often uncomfortable and should be avoided.  - You might miss a variable period of time from work and hopefully this issue has been discussed prior to surgery. You may not do any heavy work with your affected hand for about 2 weeks.    [facility] [address] [place],  [place]  [address] [phone]   Post Anesthesia Home Care Instructions  Activity: Get plenty of rest for the remainder of the day. A responsible  individual must stay with you for 24 hours following the procedure.  For the next 24 hours, DO NOT: -Drive a car -Operate machinery -Drink alcoholic beverages -Take any medication unless instructed by your physician -Make any legal decisions or sign important papers.  Meals: Start with liquid foods such as gelatin or soup. Progress to regular foods as tolerated. Avoid greasy, spicy, heavy foods. If nausea and/or vomiting occur, drink only clear liquids until the nausea and/or vomiting subsides. Call your physician if vomiting continues.  Special Instructions/Symptoms: Your throat may feel dry or sore from the anesthesia or the breathing tube placed in your throat during surgery. If this causes discomfort, gargle with warm salt water. The discomfort should disappear within 24 hours.  If you had a scopolamine patch placed behind your ear for the management of post- operative nausea and/or vomiting:  1. The medication in the patch is effective for 72 hours, after which it should be removed.  Wrap patch in a tissue and discard in the trash. Wash hands thoroughly with soap and water. 2. You may remove the patch earlier than 72 hours if you experience unpleasant side effects which may include dry mouth, dizziness or visual disturbances. 3. Avoid touching the patch. Wash your hands with soap and water after contact with the patch.     

## 2021-11-28 NOTE — Transfer of Care (Signed)
Immediate Anesthesia Transfer of Care Note ? ?Patient: [name] [name] ? ?Procedure(s) Performed: RIGHT CARPAL TUNNEL RELEASE (Right: Hand) ? ?Patient Location: PACU ? ?Anesthesia Type:MAC ? ?Level of Consciousness: awake ? ?Airway & Oxygen Therapy: Patient Spontanous Breathing ? ?Post-op Assessment: Report given to RN and Post -op Vital signs reviewed and stable ? ?Post vital signs: Reviewed and stable ? ?Last Vitals:  ?Vitals Value Taken Time  ?BP 112/57   ?Temp 97.6   ?Pulse 61   ?Resp 11   ?SpO2 96   ? ? ?Last Pain:  ?Vitals:  ? 11/28/21 0659  ?TempSrc: Oral  ?PainSc: 0-No pain  ?   ? ?Patients Stated Pain Goal: 1 (11/28/21 6190) ? ?Complications: No notable events documented. ?

## 2021-11-28 NOTE — Interval H&P Note (Signed)
History and Physical Interval Note: ? ?11/28/2021 ?8:31 AM ? ?[name] [name]  has presented today for surgery, with the diagnosis of RIGHT CARPAL TUNNEL SYNDROME.  The various methods of treatment have been discussed with the patient and family. After consideration of risks, benefits and other options for treatment, the patient has consented to  Procedure(s): ?RIGHT CARPAL TUNNEL RELEASE (Right) as a surgical intervention.  The patient's history has been reviewed, patient examined, no change in status, stable for surgery.  I have reviewed the patient's chart and labs.  Questions were answered to the patient's satisfaction.   ? ? ? Josie Mesa ? ? ?

## 2021-11-30 ENCOUNTER — Telehealth: Payer: Self-pay

## 2021-11-30 NOTE — Telephone Encounter (Signed)
Pt was called and advised and stated understanding 

## 2021-11-30 NOTE — Telephone Encounter (Signed)
Is this ok?

## 2021-11-30 NOTE — Telephone Encounter (Signed)
Patient called triage. She had carpal tunnel release on 11/28/2021. She states that her bandage is tight and would like to remove it completely if possible. Please ask Dr.Benfield and advise. [phone]  Thanks!

## 2021-12-07 ENCOUNTER — Ambulatory Visit (INDEPENDENT_AMBULATORY_CARE_PROVIDER_SITE_OTHER): Payer: 59 | Admitting: Orthopedic Surgery

## 2021-12-07 ENCOUNTER — Encounter: Payer: Self-pay | Admitting: Orthopedic Surgery

## 2021-12-07 DIAGNOSIS — G5601 Carpal tunnel syndrome, right upper limb: Secondary | ICD-10-CM

## 2021-12-07 NOTE — Progress Notes (Signed)
Post-Op Visit Note   Patient: [name] [name]           Date of Birth: 02-Apr-1946           MRN: [id number] Visit Date: 12/07/2021 PCP: [name], DO   Assessment & Plan:  Chief Complaint:  Chief Complaint  Patient presents with   Right Hand - Routine Post Op   Visit Diagnoses:  1. Carpal tunnel syndrome, right upper limb     Plan: Patient is now 10 days s/p R CTR.  Her incision remains well approximated with no surrounding erythema or induration.  The sutures were removed.  Her nocturnal symptoms and previous paresthesias have resolved.  I can see her back as needed.   Follow-Up Instructions: No follow-ups on file.   Orders:  No orders of the defined types were placed in this encounter.  No orders of the defined types were placed in this encounter.   Imaging: No results found.  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   Upper airway cough syndrome 06/13/2020   Class 3 severe obesity due to excess calories with serious comorbidity and body mass index (BMI) of 45.0 to 49.9 in adult ([place]) 06/13/2020   MCI (mild cognitive impairment) 06/13/2020   Insulin resistance 05/09/2020   Insomnia 05/09/2020   Vitamin D deficiency 02/17/2019   Diarrhea 02/17/2019   Essential hypertension 02/17/2019   Class 3 severe obesity due to excess calories with serious comorbidity and body mass index (BMI) of 40.0 to 44.9 in adult ([place]) 02/17/2019   Hair loss 02/17/2019   Anxiety 11/30/2017   Primary osteoarthritis of both knees 11/30/2017   Abnormal WBC count 11/30/2017   Obesity (BMI 30-39.9) 07/23/2013   Suspicious mole 02/01/2013   Palpitations 07/23/2012   Hyperlipidemia 01/28/2012   Morbid obesity ([place]) 04/24/2011   Preventative health care 02/20/2011   Skin lump of leg 10/10/2010    SORE THROAT 08/15/2010   LYMPHADENOPATHY 08/15/2010   TOBACCO USE 01/17/2010   Hyperlipidemia associated with type 2 diabetes mellitus ([place]) 04/05/2008   TINNITUS, CHRONIC, 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   ALLERGIC RHINITIS 01/25/2008   LIVER FUNCTION TESTS, ABNORMAL 01/25/2008   Hypothyroidism 01/13/2007   Depression with anxiety 01/13/2007   TRANSIENT GLOBAL AMNESIA 01/13/2007   FOOT SURGERY, HX OF 01/13/2007   Past Medical History:  Diagnosis Date   Allergic rhinitis    Arthritis    Cataract    Depression    Diabetes mellitus without complication (HCC)    Emphysema lung (HCC)    Early stage   Endometrial polyp    Hyperlipidemia    Hyperlipidemia    on simvastatin   Hypothyroidism    IBS (irritable bowel syndrome)    Obesity    Osteoarthritis    Seasonal allergies    Sleep apnea     Family History  Problem Relation Age of Onset   Colon cancer Father 61   Cancer Father 27       colon   Alcoholism Father    Pancreatic cancer Mother 46   Cancer Mother 57       pancreatic   Colon cancer Paternal Grandmother    Cancer Paternal Grandmother 59       colon   Heart attack Maternal Grandmother 66   Heart disease Maternal Grandmother  Cancer Sister 58       melanoma   Breast cancer Sister    Cancer Brother 43       skin   Thyroid disease Sister    Breast cancer Maternal Aunt 84   Heart attack Maternal Aunt 60   Breast cancer Sister     Past Surgical History:  Procedure Laterality Date   BUNIONECTOMY  2009   Dr.Duda, left   CARPAL TUNNEL RELEASE Right 11/28/2021   Procedure: RIGHT CARPAL TUNNEL RELEASE;  Surgeon: Sherilyn Cooter, MD;  Location: [facility];  Service: Orthopedics;  Laterality: Right;   EYE SURGERY     HYSTEROSCOPY WITH D & C  04/24/2011   Procedure: DILATATION AND CURETTAGE (D&C) /HYSTEROSCOPY;  Surgeon: Eldred Manges, MD;  Location: [facility];  Service: Gynecology;  Laterality: N/A;   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   Dr Chrys Racer FASCIA SURGERY  1994   Dr Percell Miller   POLYPECTOMY  04/24/2011   Procedure: POLYPECTOMY;  Surgeon: Eldred Manges, MD;  Location: [facility];  Service: Gynecology;  Laterality: N/A;   Social History   Occupational History   Occupation: Daycare p/t    Comment: retired  Tobacco Use   Smoking status: Former    Packs/day: 2.00    Years: 42.00    Pack years: 84.00    Types: Cigarettes    Quit date: 07/28/2012    Years since quitting: 9.3   Smokeless tobacco: Never  Vaping Use   Vaping Use: Former  Substance and Sexual Activity   Alcohol use: Yes    Comment: rare   Drug use: Never   Sexual activity: Yes    Partners: Male    Birth control/protection: Post-menopausal

## 2021-12-26 ENCOUNTER — Telehealth: Payer: Self-pay | Admitting: *Deleted

## 2021-12-26 NOTE — Telephone Encounter (Signed)
Received surgical clearance from [facility] for right knee arthroplasty.  Patient scheduled for 01/17/22 at 1120am.  Form is on [employer]

## 2022-01-09 ENCOUNTER — Encounter: Payer: Self-pay | Admitting: Neurology

## 2022-01-09 ENCOUNTER — Ambulatory Visit: Payer: 59 | Admitting: Neurology

## 2022-01-09 VITALS — BP 115/74 | HR 71 | Ht 60.5 in | Wt 196.0 lb

## 2022-01-09 DIAGNOSIS — E661 Drug-induced obesity: Secondary | ICD-10-CM | POA: Diagnosis not present

## 2022-01-09 DIAGNOSIS — Z6837 Body mass index (BMI) 37.0-37.9, adult: Secondary | ICD-10-CM

## 2022-01-09 DIAGNOSIS — G4721 Circadian rhythm sleep disorder, delayed sleep phase type: Secondary | ICD-10-CM | POA: Diagnosis not present

## 2022-01-09 DIAGNOSIS — Z8669 Personal history of other diseases of the nervous system and sense organs: Secondary | ICD-10-CM | POA: Diagnosis not present

## 2022-01-09 NOTE — Progress Notes (Signed)
[facility]    SLEEP MEDICINE CLINIC    Provider:  Larey Seat, MD  Primary Care Physician:  [name], DO [address] [place] [address]     Referring Provider: Claudette Laws [address],  [place] [address]   Her dentist, Dr.Douglas Kellog, DDS @ [facility].          Chief Complaint according to patient   Patient presents with:     New Patient (Initial Visit)           HISTORY OF PRESENT ILLNESS:  [name] [name] is a 76 year- old Caucasian female patient who was seen here upon PCP based new referral on 01/09/2022, she has not fllowed up on her sleep study , wants now to address memory.   have the pleasure of seeing [name] [name] today, a right-handed  Caucasian female with untreated REM dependent Sleep apnea. .   She  has a past medical history of Allergic rhinitis, Arthritis, Cataract, Depression, Diabetes mellitus without complication ([place]), Emphysema lung ([place]), Endometrial polyp, Hyperlipidemia, Hyperlipidemia, Hypothyroidism, IBS (irritable bowel syndrome), Obesity, Osteoarthritis, Seasonal allergies, and Sleep apnea. When I originally met with [name] [name] and was in November 2021 she had complained about a delayed sleep phase syndrome and also possible snoring, her dentist wanted her checked if she could be a candidate for a dental device should she test positive for sleep apnea.  She underwent home sleep testing in December 2021 her Epworth sleepiness score at the time was 6 out of 24 her BMI was 47.6 per neck circumference 16 inches.  She was able to sleep 7 hours and 4 minutes recorded on the home sleep test device with 50% REM sleep.  Her overall AHI apnea hypopnea index was 18/h but was strictly REM sleep dependent during dream sleep her AHI rose to 49.2/h so this is REM sleep dependent apnea she did not have significant hypoxia she had normal pulse rate variations.  And my recommendation at the time clearly  stated that REM sleep dependent apnea requires positive airway pressure to allow ventilation of deeper pulmonary systems.  This form of apnea is more often associated with obesity.  And also snoring can be treated by CPAP and BiPAP as well.  I had the time ordered a CPAP device auto titration with settings from 5 through 16 cmH2O to centimeter expiratory pressure relief and I asked the medical equipment company to have the patient measured and fitted for a mask of her choice.  At the time we were still in the midst of the pandemic and had a lot of delays the supply chain.  The patient states today that she did not feel she could tolerate CPAP neither did she feel she would tolerate a dental device.  07-26-2020; telephone results: [name] [name], [name] Mourning, [name] [name]      I called patient.  I discussed her sleep study results and recommendations.  I discussed starting an AutoPap.  Patient would like to think about this further before deciding.  I discussed the risks and ramifications of untreated sleep apnea with patient.  I offered her an appointment with Dr. Brett Fairy to discuss further bu she declined at this time.  Patient will call us back with her decision.  Patient verbalized understanding of results.  Patient had no further questions or concerns.      01-09-2022:  Chief concern according to patient : " I  don't think I can use a CPAP but have never tried. I was successfully in losing weight and I exercise in preparation for knee surgery- September 18 th is the set date for surgery. Dr Maureen Ralphs. BMI is 37 down from 47, I move better, Epwrt is 3 points now, FSS at 39/ 63 points, GDS 2/ 15 points. "  She mainly wants to address cognitive concerns: Or visit started with the obtainment of a Montreal cognitive assessment in which she scored 27 out of 30 points.  She did have trouble with the Trail Making Test she actually just forgot 1 link of connection between the C and the figure 4.  Otherwise she would have had 28 out  of 30 points she was able to name all animals draw a clock face she did the serial sevens she recalled 5 out of 5 words which is rare in my practice even for patients that do not have cognitive concerns.  She just mixed up to date by 1 pop 1 day and that can happen today is the 28th and she stated it was a 27's.  So truly there is no indication here of a cognitive deficit.      Social history:  Patient is retired from [employer],  and lives in a household with her spouse, they have no children. .husband has CHF, they live self isolated, both are fully vaccinated.  Pets are present. 2 cats.  Tobacco use- 40 years, quit in 2/ 2014.   ETOH use - 3-4 a year, Caffeine intake in form of Coffee( 1) Soda( /) Tea ( one in Am and one in PM ) or energy drinks. Regular exercise in form of restricted walking - uses a cane, has knee pain. She lost  50 pounds in prep for a knee surgery in 09/2018. Since then she has gained it all back , the knee surgery never took place. Has again lost weight, now BMI is 37.  Dr Maureen Ralphs.   Hobbies : reading, quilting, painting, baking.    Sleep habits are as follows: The patient's dinner time is between 7 PM. The patient has a snack by midnight , works/ plays on hr computer-  finally goes to bed at 4 AM and continues to sleep for 7-9 hours, wakes at 1-2 PM.  The preferred sleep position is prone or sideways , with the support of 2 pillows.  Dreams are reportedly infrequent..  The patient wakes up spontaneously.   She reports not feeling refreshed or restored in AM, with symptoms such as dry mouth,  and residual fatigue. Naps are taken infrequently.  Review of Systems: Out of a complete 14 system review, the patient complains of only the following symptoms, and all other reviewed systems are negative.:  Fatigue, sleepiness , snoring, fragmented sleep,   OSA Insomnia - delayed sleep phase - she slipped into this during the pandemic.  Memory concerns.  Knee pain.    How likely  are you to doze in the following situations: 0 = not likely, 1 = slight chance, 2 = moderate chance, 3 = high chance   Sitting and Reading? Watching Television? Sitting inactive in a public place (theater or meeting)? As a passenger in a car for an hour without a break? Lying down in the afternoon when circumstances permit? Sitting and talking to someone? Sitting quietly after lunch without alcohol? In a car, while stopped for a few minutes in traffic?   Total = 6/ 24 points   FSS endorsed at  39/ 63 points.   GDS; 4/ 15   Social History   Socioeconomic History   Marital status: Married    Spouse name: Sherene Plancarte   Number of children: 0   Years of education: Not on file   Highest education level: Master's degree (e.g., MA, MS, MEng, MEd, MSW, MBA)  Occupational History   Occupation: Daycare p/t    Comment: retired  Tobacco Use   Smoking status: Former    Packs/day: 2.00    Years: 42.00    Total pack years: 84.00    Types: Cigarettes    Quit date: 07/28/2012    Years since quitting: 9.4   Smokeless tobacco: Never  Vaping Use   Vaping Use: Former  Substance and Sexual Activity   Alcohol use: Yes    Comment: rare   Drug use: Never   Sexual activity: Yes    Partners: Male    Birth control/protection: Post-menopausal  Other Topics Concern   Not on file  Social History Narrative   Regular exercise- no    Lives with spouse   Caffeine- 2 teas daily   Social Determinants of Health   Financial Resource Strain: Not on file  Food Insecurity: Not on file  Transportation Needs: Not on file  Physical Activity: Not on file  Stress: Not on file  Social Connections: Not on file    Family History  Problem Relation Age of Onset   Colon cancer Father 56   Cancer Father 37       colon   Alcoholism Father    Pancreatic cancer Mother 3   Cancer Mother 82       pancreatic   Colon cancer Paternal Grandmother    Cancer Paternal Grandmother 61       colon   Heart attack  Maternal Grandmother 47   Heart disease Maternal Grandmother    Cancer Sister 58       melanoma   Breast cancer Sister    Cancer Brother 15       skin   Thyroid disease Sister    Breast cancer Maternal Aunt 30   Heart attack Maternal Aunt 60   Breast cancer Sister     Past Medical History:  Diagnosis Date   Allergic rhinitis    Arthritis    Cataract    Depression    Diabetes mellitus without complication ([place])    Emphysema lung ([place])    Early stage   Endometrial polyp    Hyperlipidemia    Hyperlipidemia    on simvastatin   Hypothyroidism    IBS (irritable bowel syndrome)    Obesity    Osteoarthritis    Seasonal allergies    Sleep apnea     Past Surgical History:  Procedure Laterality Date   BUNIONECTOMY  2009   Dr.Duda, left   CARPAL TUNNEL RELEASE Right 11/28/2021   Procedure: RIGHT CARPAL TUNNEL RELEASE;  Surgeon: Sherilyn Cooter, MD;  Location: [facility];  Service: Orthopedics;  Laterality: Right;   EYE SURGERY     HYSTEROSCOPY WITH D & C  04/24/2011   Procedure: DILATATION AND CURETTAGE (D&C) /HYSTEROSCOPY;  Surgeon: Eldred Manges, MD;  Location: [facility];  Service: Gynecology;  Laterality: N/A;   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 20 tablet 0   Biotin 1 MG CAPS Take by mouth.     Collagen-Vitamin C-Biotin (COLLAGEN 1500/C PO) Take by mouth.     cycloSPORINE (RESTASIS) 0.05 % ophthalmic emulsion 1 drop 2 (two) times daily.     levothyroxine (SYNTHROID) 125 MCG tablet TAKE 1 TABLET BY MOUTH  DAILY BEFORE BREAKFAST 90 tablet 1    loperamide (IMODIUM) 2 MG capsule TAKE 1 CAPSULE BY MOUTH  TWICE DAILY 180 capsule 1   metFORMIN (GLUCOPHAGE) 500 MG tablet Take 1 tablet (500 mg total) by mouth daily with breakfast. 90 tablet 3   multivitamin (THERAGRAN) per tablet Take 1 tablet by mouth daily.       sertraline (ZOLOFT) 100 MG tablet Take 1 tablet (100 mg total) by mouth daily. 90 tablet 1   simvastatin (ZOCOR) 20 MG tablet TAKE 1 TABLET BY MOUTH AT  BEDTIME (NEED OFFICE VISIT  FOR FURTHER REFILLS) 30 tablet 11   Vitamin D, Ergocalciferol, (DRISDOL) 1.25 MG (50000 UNIT) CAPS capsule TAKE 1 CAPSULE BY MOUTH  EVERY 7 DAYS 13 capsule 3   No current facility-administered medications on file prior to visit.    Allergies  Allergen Reactions   Tape     Red, swollen, itching    Physical exam:  Today's Vitals   01/09/22 1431  BP: 115/74  Pulse: 71  Weight: 196 lb (88.9 kg)  Height: 5' 0.5" (1.537 m)   Body mass index is 37.65 kg/m.   Wt Readings from Last 3 Encounters:  01/09/22 196 lb (88.9 kg)  11/28/21 199 lb 1.2 oz (90.3 kg)  11/02/21 199 lb 12.8 oz (90.6 kg)     Ht Readings from Last 3 Encounters:  01/09/22 5' 0.5" (1.537 m)  11/28/21 5' (1.524 m)  11/02/21 5' 0.5" (1.537 m)      General: The patient is awake, alert and appears not in acute distress. The patient is well groomed. Head: Normocephalic, atraumatic. Neck is supple. Mallampati 2,   neck circumference: 16 inches . Nasal airflow patent.  Retrognathia is mildly seen. B ite guard user , now on invisaline.  Dental status: Bite guard user , now on invisaline.  Cardiovascular:  Regular rate and cardiac rhythm by pulse,  without distended neck veins. Respiratory: Lungs are clear to auscultation.  Skin:  Without evidence of ankle edema, or rash. Trunk: The patient's posture is erect.   Neurologic exam : The patient is awake and alert, oriented to place and time.   Memory subjective described as intact.  Attention span & concentration ability  appears normal.      01/09/2022    2:42 PM  Montreal Cognitive Assessment   Visuospatial/ Executive (0/5) 4  Naming (0/3) 3  Attention: Read list of digits (0/2) 1  Attention: Read list of letters (0/1) 1  Attention: Serial 7 subtraction starting at 100 (0/3) 3  Language: Repeat phrase (0/2) 2  Language : Fluency (0/1) 1  Abstraction (0/2) 2  Delayed Recall (0/5) 5  Orientation (0/6) 5  Total 27    Speech is fluent,  without  dysarthria, dysphonia or aphasia.  Mood and affect are appropriate.   Cranial nerves: no loss of smell or taste reported  Pupils are equal and briskly reactive  to light. Funduscopic exam deferred. .  Extraocular movements in vertical and horizontal planes were intact and without nystagmus. No Diplopia. Visual fields by finger perimetry are intact. Hearing was intact to soft voice and finger rubbing. Facial sensation intact to fine touch. Facial motor strength is symmetric and tongue and uvula move midline.  Neck ROM : rotation, tilt and flexion extension were normal for age and shoulder shrug was symmetrical.    Motor exam:  Symmetric bulk, tone and ROM.   Normal tone without cog-wheeling, symmetric grip strength. Normal hip felxion , adduction and abduction, no foot drop.  Equal size of thenar eminences.   Sensory:  Fine touch and vibration were normal.   Coordination: Rapid alternating movements in the fingers/hands were of normal speed.  The Finger-to-nose maneuver was intact without evidence of ataxia, dysmetria or tremor. Gait and station: Patient could rise unassisted from a seated position, walked without a cane since PT, improved core strength. .   Deep tendon reflexes: in the upper and lower extremities are symmetric, and brisk (!)- intact.  Babinski response was deferred ,     After spending a total time of 45 minutes face to face and additional time for physical and neurologic examination, review of laboratory studies,  personal review of  imaging studies, reports and results of other testing and review of referral information / records as far as provided in visit, I have established the following assessments:  1) Had mild- moderate  but REM dependent Obstructive SLEEP apnea.- Small upper airway- dysphonia and dysphagia- choking on saliva. Obesity is another risk factor, she has lost weight, she should be retested once. 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  Improve sleep hygiene means  cut off screen time.   3) chronic knee pain, expecting surgery. Check for apnea before surgery.   4) MOCA 27/ 30- she reports difficulties finding words but I see more of an inattention. Result was not diagnostic .    My Plan is to proceed with:  HST repeat to see if weight loss has lessened the previously diagnosed apnea.  If still present, she will give CPAP a trial, she is worried as a prone sleeper not to keep a mask in place. Would consider nasal cradle..   I would like to thank Carollee Herter, Alferd Apa, DO and [address] [place],  [place] [address] for allowing me to meet with and to take care of this pleasant patient.   In short, FIDELA CIESLAK will follow up after her repeat test, this time a  home sleep test,   CC: I will share my notes with her dentist, PCP and her surgeon. .   Electronically signed by: Larey Seat, MD 01/09/2022 2:57 PM  [facility] Board certified by The [employer] of Sleep Medicine and Diplomate of the [employer] of Sleep Medicine. Board certified In Neurology through the [place], Fellow of the [employer] of Neurology. Medical Director of [employer].

## 2022-01-09 NOTE — Patient Instructions (Signed)
Safe Surgery and Sleep Apnea Sleep apnea is a condition in which breathing pauses or becomes shallow during sleep. Most people with the condition are not aware that they have it. Your health care providers need to know whether or not you have sleep apnea, especially if you are having surgery. Sleep apnea can increase your risk of complications during and after surgery. Tell a health care provider about: Any medical conditions you have, especially if you have sleep apnea. Any allergies you have. All medicines you are taking, including vitamins, herbs, eye drops, creams, and over-the-counter medicines. Any blood disorders you have. Any problems you or family members have had with anesthetic medicines. Any surgeries you have had. Whether you are pregnant or may be pregnant. What are the risks of having surgery? Untreated sleep apnea increases the risk for certain complications during and after surgery. This is because when you have sleep apnea, your airways are more sensitive to medicines used during surgery. The airways can collapse and block the flow of air.  Having untreated sleep apnea can increase your risk for: A longer stay in the recovery room or hospital. Breathing difficulties such as low oxygen levels after surgery. Increased pain after surgery. Irregular heart rhythms. Stroke. Heart attack. You and your health care provider can take steps to help prevent these and other complications. What happens before the surgery? Sleep apnea screening Sleep apnea screening is a series of questions that determine if you are at risk for sleep apnea. Before you have surgery, get screened for sleep apnea and talk with your surgeon and primary health care provider about your results. Screening usually involves answering questions about your sleep quality. Ask your health care provider if you can be screened, or take a screening test yourself. You can find these tests online at the American Sleep Apnea  Association website. Some questions you may be asked include: Do you snore? Is your sleep restless? Do you have daytime sleepiness? Has a partner or spouse told you that you stop breathing during sleep? Have you had trouble concentrating or memory loss? Answer these questions honestly. If a screening test is positive, this means you are at risk for the condition. Further testing may be needed to confirm a diagnosis of sleep apnea. General instructions Talk to your health care provider about your individual risks based on your screening results and the type of surgery you will be having. If you have a sleep apnea device (positive airway pressure device), wear it as prescribed. If you have not been wearing your device, talk with your health care provider about why you have not been wearing it. There are ways to improve your use of the device, such as: Adjusting the mask. Adding humidified air. Getting treatment for nasal congestion. Do not use any products that contain nicotine or tobacco. These products include cigarettes, chewing tobacco, and vaping devices, such as e-cigarettes. If you need help quitting, ask your health care provider. Do not drink alcohol the day before or after your surgery because it can worsen your sleep apnea. What happens on the day of surgery? If told by your health care provider, bring your sleep apnea device with you. Wear your sleep apnea device when you are sleeping during your hospital stay, or as told by your health care provider. Ask your health care provider what special considerations will be taken during and after your surgery. What can I expect after the surgery? You may need to be given extra oxygen and wear a continuous   oxygen monitor (pulse oximetry). For your safety, you may need to stay in the recovery room or hospital for longer than usual. Follow these instructions at home: Medicines Avoid using sleep medicines unless they are prescribed by a health  care provider who is aware of the results of your sleep apnea screening. Avoid using sleep medicines while taking opioid pain medicine. Limit your use of opioid pain medicines as much as possible. Ask your health care provider what is a safe amount to use. Ask about using pain medicines that do not affect your breathing, such as NSAIDs or acetaminophen. General instructions  If your health care provider approves, raise the head of your bed or lie on your side. Do not lie flat on your back. Follow instructions from your health care provider about wearing your sleep device: Anytime you are sleeping, including during daytime naps. While taking prescription pain medicines, sleeping medicines, or medicines that make you drowsy. If you will be going home right after the procedure, plan to have a responsible adult care for you for the time you are told. This is important. Where to find more information For more information about sleep apnea screening and healthy sleep, visit these websites: Centers for Disease Control and Prevention: www.cdc.gov American Sleep Apnea Association: www.sleepapnea.org Contact a health care provider if: You have sleep apnea or think you may be at risk for sleep apnea, and you are scheduled for surgery. Get help right away if: You have trouble breathing. You are very drowsy and cannot stay awake. You are told that you have pauses in your breathing during sleep after surgery. You have chest pain. You have a fast heartbeat. These symptoms may represent a serious problem that is an emergency. Do not wait to see if the symptoms will go away. Get medical help right away. Call your local emergency services (911 in the [place] Do not drive yourself to the hospital. Summary Your health care providers need to know whether or not you have sleep apnea, especially if you are having surgery. If you have sleep apnea, you are at an increased risk for complications during surgery. You  and your health care provider can take precautions to help prevent complications. If you have sleep apnea, make sure to tell your health care provider and anesthesia specialist. This information is not intended to replace advice given to you by your health care provider. Make sure you discuss any questions you have with your health care provider. Document Revised: 06/09/2020 Document Reviewed: 06/09/2020 Elsevier Patient Education  2023 [employer]  

## 2022-01-11 ENCOUNTER — Other Ambulatory Visit: Payer: Self-pay | Admitting: Family Medicine

## 2022-01-11 DIAGNOSIS — E559 Vitamin D deficiency, unspecified: Secondary | ICD-10-CM

## 2022-01-17 ENCOUNTER — Ambulatory Visit: Payer: 59 | Admitting: Family Medicine

## 2022-01-17 ENCOUNTER — Encounter: Payer: Self-pay | Admitting: Family Medicine

## 2022-01-17 ENCOUNTER — Other Ambulatory Visit: Payer: Self-pay

## 2022-01-17 VITALS — BP 124/78 | HR 66 | Temp 98.1°F | Resp 18 | Ht 59.0 in | Wt 196.0 lb

## 2022-01-17 DIAGNOSIS — Z01818 Encounter for other preprocedural examination: Secondary | ICD-10-CM | POA: Diagnosis not present

## 2022-01-17 DIAGNOSIS — E039 Hypothyroidism, unspecified: Secondary | ICD-10-CM | POA: Diagnosis not present

## 2022-01-17 DIAGNOSIS — E785 Hyperlipidemia, unspecified: Secondary | ICD-10-CM

## 2022-01-17 DIAGNOSIS — E1169 Type 2 diabetes mellitus with other specified complication: Secondary | ICD-10-CM | POA: Diagnosis not present

## 2022-01-17 DIAGNOSIS — E1165 Type 2 diabetes mellitus with hyperglycemia: Secondary | ICD-10-CM | POA: Diagnosis not present

## 2022-01-17 DIAGNOSIS — M1711 Unilateral primary osteoarthritis, right knee: Secondary | ICD-10-CM | POA: Insufficient documentation

## 2022-01-17 LAB — COMPREHENSIVE METABOLIC PANEL
ALT: 11 U/L (ref 0–35)
AST: 17 U/L (ref 0–37)
Albumin: 4.2 g/dL (ref 3.5–5.2)
Alkaline Phosphatase: 72 U/L (ref 39–117)
BUN: 18 mg/dL (ref 6–23)
CO2: 30 mEq/L (ref 19–32)
Calcium: 9.3 mg/dL (ref 8.4–10.5)
Chloride: 102 mEq/L (ref 96–112)
Creatinine, Ser: 0.62 mg/dL (ref 0.40–1.20)
GFR: 88.21 mL/min (ref >60.00)
Glucose, Bld: 86 mg/dL (ref 70–99)
Potassium: 4.1 mEq/L (ref 3.5–5.1)
Sodium: 139 mEq/L (ref 135–145)
Total Bilirubin: 0.5 mg/dL (ref 0.2–1.2)
Total Protein: 6 g/dL (ref 6.0–8.3)

## 2022-01-17 LAB — LIPID PANEL
Cholesterol: 170 mg/dL (ref 0–200)
HDL: 69.7 mg/dL (ref >39.00)
LDL Cholesterol: 83 mg/dL (ref 0–99)
NonHDL: 99.85
Total CHOL/HDL Ratio: 2
Triglycerides: 83 mg/dL (ref 0.0–149.0)
VLDL: 16.6 mg/dL (ref 0.0–40.0)

## 2022-01-17 LAB — HEMOGLOBIN A1C: Hgb A1c MFr Bld: 5.5 % (ref 4.6–6.5)

## 2022-01-17 LAB — CBC WITH DIFFERENTIAL/PLATELET
Basophils Absolute: 0 10*3/uL (ref 0.0–0.1)
Basophils Relative: 0.7 % (ref 0.0–3.0)
Eosinophils Absolute: 0.1 10*3/uL (ref 0.0–0.7)
Eosinophils Relative: 1.9 % (ref 0.0–5.0)
HCT: 39.8 % (ref 36.0–46.0)
Hemoglobin: 13.1 g/dL (ref 12.0–15.0)
Lymphocytes Relative: 24.9 % (ref 12.0–46.0)
Lymphs Abs: 0.9 10*3/uL (ref 0.7–4.0)
MCHC: 33 g/dL (ref 30.0–36.0)
MCV: 90.5 fl (ref 78.0–100.0)
Monocytes Absolute: 0.3 10*3/uL (ref 0.1–1.0)
Monocytes Relative: 7.8 % (ref 3.0–12.0)
Neutro Abs: 2.3 10*3/uL (ref 1.4–7.7)
Neutrophils Relative %: 64.7 % (ref 43.0–77.0)
Platelets: 161 10*3/uL (ref 150.0–400.0)
RBC: 4.4 Mil/uL (ref 3.87–5.11)
RDW: 15.7 % — ABNORMAL HIGH (ref 11.5–15.5)
WBC: 3.5 10*3/uL — ABNORMAL LOW (ref 4.0–10.5)

## 2022-01-17 LAB — TSH: TSH: 1.42 u[IU]/mL (ref 0.35–5.50)

## 2022-01-17 NOTE — Patient Instructions (Signed)
Preparing for Knee Replacement Getting prepared before knee replacement surgery can make recovery easier and more comfortable. This document provides some tips and guidelines that will help you prepare for your surgery. Talk with your health care provider so you can learn what to expect before, during, and after surgery. Ask questions if you do not understand something. Tell a health care provider about: Any allergies you have. All medicines you are taking, including vitamins, herbs, eye drops, creams, and over-the-counter medicines. Any problems you or family members have had with anesthetic medicines. Any blood disorders you have. Any surgeries you have had. Any medical conditions you have. Whether you are pregnant or may be pregnant. What happens before the procedure? Visit your health care providers Keep all appointments before surgery. You will need to have a physical exam before surgery (preoperative exam) to make sure it is safe for you to have knee replacement surgery. You may also need to have more tests. When you go to the exam, bring a list of all the medicines and supplements, including herbs and vitamins, that you take. Have dental care and routine cleanings done before surgery. Germs from anywhere in your body, including your mouth, can travel to your new joint and infect it. Tell your dentist that you plan to have knee replacement surgery. Know the costs of surgery To find out how much the surgery will cost, call your insurance company as soon as you decide to have surgery. Ask questions like: How much of the surgery and hospital stay will be covered? What will be covered for: Medical equipment? Rehabilitation facilities? Home care? Prepare your home Pick a recovery spot that is not your bed. It is better that you sit more upright during recovery. You may want to use a recliner. Place items that you often use on a small table near your recovery spot. These may include the TV  remote, a cordless phone or your mobile phone, a book, a laptop computer, and a water glass. Move other items you will need to shelves and drawers that are at countertop height. Do this in your kitchen, bathroom, and bedroom. You may be given a walker to use at home. Check if you will have enough room to use a walker. Move around your home with your hands out about 6 inches (15 cm) from your sides. You will have enough room if you do not hit anything with your hands as you do this. Walk from: Your recovery spot to your kitchen and bathroom. Your bed to the bathroom. Prepare some meals to freeze and reheat later. Make your home safe for recovery     Remove all clutter and throw rugs from your floors. This will help you avoid tripping. Consider getting safety equipment that will be helpful during your recovery, such as: Grab bars in the shower and near the toilet. A raised toilet seat. This will help you get on and off the toilet more easily. A tub or shower bench. Prepare your body If you smoke, quit as soon as you can before surgery. If there is time, it is best to quit several months before surgery. Tell your surgeon if you use any products that contain nicotine or tobacco. These products include cigarettes, chewing tobacco, and vaping devices, such as e-cigarettes. These can delay healing. If you need help quitting, ask your health care provider. Talk to your health care provider about doing exercises before your surgery. Doing these exercises in the weeks before your surgery may help reduce   pain and improve function after surgery. Be sure to follow the exercise program given by your health care provider. Maintain a healthy diet. Do not change your diet before surgery unless your health care provider tells you to do that. Do not drink any alcohol for at least 48 hours before surgery. Plan your recovery In the first couple of weeks after surgery, it will be harder for you to do some of your  regular activities. You may get tired easily, and you will have limited movement in your leg. To make sure you have all the help you need after your surgery: Plan to have a responsible adult take you home from the hospital. Your health care provider will tell you how many days you can expect to be in the hospital. Cancel all work, caregiving, and volunteer responsibilities for at least 4-6 weeks after surgery. Plan to have a responsible adult stay with you day and night for the first week. This person should be someone you are comfortable with. You may need this person to help you with your exercises and personal care, such as bathing and using the toilet. If you live alone, arrange for someone to take care of your home and pets for the first 4-6 weeks after surgery. Arrange for drivers to take you to follow-up visits, the grocery store, and other places you may need to go for at least 4-6 weeks. Consider applying for a disability parking permit. To get an application, call your local department of motor vehicles ([employer]) or your health care provider's office. Summary Getting prepared before knee replacement surgery can make your recovery easier and more comfortable. Keep all visits to your health care provider before surgery. You will have an exam and may have tests to make sure that you are ready for your surgery. Prepare your home and arrange for help at home. Plan to have a responsible adult take you home from the hospital. Also, plan to have a responsible adult stay with you day and night for the first week after you leave the hospital. This information is not intended to replace advice given to you by your health care provider. Make sure you discuss any questions you have with your health care provider. Document Revised: 12/21/2019 Document Reviewed: 12/21/2019 Elsevier Patient Education  2023 Elsevier Inc.  

## 2022-01-17 NOTE — Progress Notes (Signed)
Subjective:   By signing my name below, I, [name] [name], attest that this documentation has been prepared under the direction and in the presence of [name] Held, DO  01/17/2022   Patient ID: [name] [name], female    DOB: December 17, 1945, 76 y.o.   MRN: [id number]  Chief Complaint  Patient presents with   Pre-op Clearance    Right Knee replacement.     HPI Patient is in today for an surgical clearance visit.  She is requesting surgical clearance for her upcomming total knee arthroplasty. Her procedure is set for 04/01/2022.  Her EKG results were normal during this visit.  Her blood pressure is doing well during this visit.  BP Readings from Last 3 Encounters:  01/17/22 124/78  01/09/22 115/74  11/28/21 (!) 145/71   Pulse Readings from Last 3 Encounters:  01/17/22 66  01/09/22 71  11/28/21 71   She does not regularly check her blood sugars at home. She continues taking 500 mg metformin daily PO and reports no new issues while taking it.  Lab Results  Component Value Date   HGBA1C 5.8 11/02/2021    Past Medical History:  Diagnosis Date   Allergic rhinitis    Arthritis    Cataract    Depression    Diabetes mellitus without complication ([place])    Emphysema lung ([place])    Early stage   Endometrial polyp    Hyperlipidemia    Hyperlipidemia    on simvastatin   Hypothyroidism    IBS (irritable bowel syndrome)    Obesity    Osteoarthritis    Seasonal allergies    Sleep apnea     Past Surgical History:  Procedure Laterality Date   BUNIONECTOMY  2009   Dr.Duda, left   CARPAL TUNNEL RELEASE Right 11/28/2021   Procedure: RIGHT CARPAL TUNNEL RELEASE;  Surgeon: Sherilyn Cooter, MD;  Location: [place];  Service: Orthopedics;  Laterality: Right;   EYE SURGERY     HYSTEROSCOPY WITH D & C  04/24/2011   Procedure: DILATATION AND CURETTAGE (D&C) /HYSTEROSCOPY;  Surgeon: Eldred Manges, MD;  Location: [facility];  Service: Gynecology;  Laterality:  N/A;   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   Dr Chrys Racer FASCIA SURGERY  1994   Dr Percell Miller   POLYPECTOMY  04/24/2011   Procedure: POLYPECTOMY;  Surgeon: Eldred Manges, MD;  Location: [facility];  Service: Gynecology;  Laterality: N/A;    Family History  Problem Relation Age of Onset   Colon cancer Father 45   Cancer Father 32       colon   Alcoholism Father    Pancreatic cancer Mother 19   Cancer Mother 64       pancreatic   Colon cancer Paternal Grandmother    Cancer Paternal Grandmother 82       colon   Heart attack Maternal Grandmother 73   Heart disease Maternal Grandmother    Cancer Sister 70       melanoma   Breast cancer Sister    Cancer Brother 33       skin   Thyroid disease Sister    Breast cancer Maternal Aunt 30   Heart attack Maternal Aunt 60   Breast cancer Sister     Social History   Socioeconomic History   Marital status: Married    Spouse name: [name] [name]   Number of children: 0   Years of education: Not on file  Highest education level: Master's degree (e.g., MA, MS, MEng, MEd, MSW, MBA)  Occupational History   Occupation: Daycare p/t    Comment: retired  Tobacco Use   Smoking status: Former    Packs/day: 2.00    Years: 42.00    Total pack years: 84.00    Types: Cigarettes    Quit date: 07/28/2012    Years since quitting: 9.4   Smokeless tobacco: Never  Vaping Use   Vaping Use: Former  Substance and Sexual Activity   Alcohol use: Yes    Comment: rare   Drug use: Never   Sexual activity: Yes    Partners: Male    Birth control/protection: Post-menopausal  Other Topics Concern   Not on file  Social History Narrative   Regular exercise- no    Lives with spouse   Caffeine- 2 teas daily   Social Determinants of Health   Financial Resource Strain: Not on file  Food Insecurity: Not on file  Transportation Needs: Not on file  Physical Activity: Not on file  Stress: Not on file  Social Connections: Not on file  Intimate  Partner Violence: Not on file    Outpatient Medications Prior to Visit  Medication Sig Dispense Refill   ALPRAZolam (XANAX) 0.25 MG tablet Take 1 tablet (0.25 mg total) by mouth 2 (two) times daily as needed for anxiety. 20 tablet 0   Biotin 1 MG CAPS Take by mouth.     Collagen-Vitamin C-Biotin (COLLAGEN 1500/C PO) Take by mouth.     levothyroxine (SYNTHROID) 125 MCG tablet TAKE 1 TABLET BY MOUTH  DAILY BEFORE BREAKFAST 90 tablet 1   loperamide (IMODIUM) 2 MG capsule TAKE 1 CAPSULE BY MOUTH  TWICE DAILY 180 capsule 1   metFORMIN (GLUCOPHAGE) 500 MG tablet Take 1 tablet (500 mg total) by mouth daily with breakfast. 90 tablet 3   multivitamin (THERAGRAN) per tablet Take 1 tablet by mouth daily.       sertraline (ZOLOFT) 100 MG tablet Take 1 tablet (100 mg total) by mouth daily. 90 tablet 1   simvastatin (ZOCOR) 20 MG tablet TAKE 1 TABLET BY MOUTH AT  BEDTIME (NEED OFFICE VISIT  FOR FURTHER REFILLS) 30 tablet 11   Vitamin D, Ergocalciferol, (DRISDOL) 1.25 MG (50000 UNIT) CAPS capsule TAKE 1 CAPSULE BY MOUTH  EVERY 7 DAYS 13 capsule 3   cycloSPORINE (RESTASIS) 0.05 % ophthalmic emulsion 1 drop 2 (two) times daily.     No facility-administered medications prior to visit.    Allergies  Allergen Reactions   Tape     Red, swollen, itching    Review of Systems  Constitutional:  Negative for fever and malaise/fatigue.  HENT:  Negative for congestion.   Eyes:  Negative for blurred vision.  Respiratory:  Negative for shortness of breath.   Cardiovascular:  Negative for chest pain, palpitations and leg swelling.  Gastrointestinal:  Negative for abdominal pain, blood in stool and nausea.  Genitourinary:  Negative for dysuria and frequency.  Musculoskeletal:  Positive for joint pain. Negative for falls.  Skin:  Negative for rash.  Neurological:  Negative for dizziness, loss of consciousness and headaches.  Endo/Heme/Allergies:  Negative for environmental allergies.  Psychiatric/Behavioral:   Negative for depression. The patient is not nervous/anxious.        Objective:    Physical Exam Vitals and nursing note reviewed.  Constitutional:      Appearance: Normal appearance. She is not ill-appearing.  HENT:     Head: Normocephalic and atraumatic.  Right Ear: External ear normal.     Left Ear: External ear normal.  Eyes:     Extraocular Movements: Extraocular movements intact.     Pupils: Pupils are equal, round, and reactive to light.  Cardiovascular:     Rate and Rhythm: Normal rate and regular rhythm.     Pulses: Normal pulses.     Heart sounds: No murmur heard.    No gallop.     Comments: EKG normal Pulmonary:     Effort: Pulmonary effort is normal. No respiratory distress.     Breath sounds: Normal breath sounds. No wheezing.  Musculoskeletal:        General: Swelling and tenderness present.     Right lower leg: No edema.     Left lower leg: No edema.  Skin:    General: Skin is warm and dry.  Neurological:     Mental Status: She is alert and oriented to person, place, and time.  Psychiatric:        Judgment: Judgment normal.     BP 124/78 (BP Location: Left Arm, Patient Position: Sitting, Cuff Size: Large)   Pulse 66   Temp 98.1 F (36.7 C) (Oral)   Resp 18   Ht '4\' 11"'$  (1.499 m)   Wt 196 lb (88.9 kg)   SpO2 99%   BMI 39.59 kg/m  Wt Readings from Last 3 Encounters:  01/17/22 196 lb (88.9 kg)  01/09/22 196 lb (88.9 kg)  11/28/21 199 lb 1.2 oz (90.3 kg)    Diabetic Foot Exam - Simple   No data filed    Lab Results  Component Value Date   WBC 4.2 11/02/2021   HGB 13.8 11/02/2021   HCT 41.9 11/02/2021   PLT 169.0 11/02/2021   GLUCOSE 97 11/02/2021   CHOL 144 11/02/2021   TRIG 80.0 11/02/2021   HDL 59.40 11/02/2021   LDLDIRECT 129.1 06/13/2011   LDLCALC 68 11/02/2021   ALT 10 11/02/2021   AST 19 11/02/2021   NA 140 11/02/2021   K 4.3 11/02/2021   CL 104 11/02/2021   CREATININE 0.68 11/02/2021   BUN 19 11/02/2021   CO2 27  11/02/2021   TSH 2.64 11/02/2021   HGBA1C 5.8 11/02/2021   MICROALBUR 0.2 02/01/2013    Lab Results  Component Value Date   TSH 2.64 11/02/2021   Lab Results  Component Value Date   WBC 4.2 11/02/2021   HGB 13.8 11/02/2021   HCT 41.9 11/02/2021   MCV 89.8 11/02/2021   PLT 169.0 11/02/2021   Lab Results  Component Value Date   NA 140 11/02/2021   K 4.3 11/02/2021   CO2 27 11/02/2021   GLUCOSE 97 11/02/2021   BUN 19 11/02/2021   CREATININE 0.68 11/02/2021   BILITOT 0.5 11/02/2021   ALKPHOS 80 11/02/2021   AST 19 11/02/2021   ALT 10 11/02/2021   PROT 6.1 11/02/2021   ALBUMIN 4.2 11/02/2021   CALCIUM 9.0 11/02/2021   GFR 86.39 11/02/2021   Lab Results  Component Value Date   CHOL 144 11/02/2021   Lab Results  Component Value Date   HDL 59.40 11/02/2021   Lab Results  Component Value Date   LDLCALC 68 11/02/2021   Lab Results  Component Value Date   TRIG 80.0 11/02/2021   Lab Results  Component Value Date   CHOLHDL 2 11/02/2021   Lab Results  Component Value Date   HGBA1C 5.8 11/02/2021       Assessment & Plan:  Problem List Items Addressed This Visit       Unprioritized   Hypothyroidism   Relevant Orders   TSH   Primary osteoarthritis of right knee    Cleared for surgery       Pre-operative clearance - Primary    Pt cleared for surgery      Relevant Orders   EKG 12-Lead (Completed)   CBC with Differential/Platelet   Comprehensive metabolic panel   Hemoglobin A1c   Lipid panel   TSH   Hyperlipidemia associated with type 2 diabetes mellitus ([place])    Encourage heart healthy diet such as MIND or DASH diet, increase exercise, avoid trans fats, simple carbohydrates and processed foods, consider a krill or fish or flaxseed oil cap daily.       Relevant Orders   CBC with Differential/Platelet   Comprehensive metabolic panel   Hemoglobin A1c   Lipid panel   TSH   Hyperlipidemia    Encourage heart healthy diet such as MIND or DASH  diet, increase exercise, avoid trans fats, simple carbohydrates and processed foods, consider a krill or fish or flaxseed oil cap daily.       Other Visit Diagnoses     Type 2 diabetes mellitus with hyperglycemia, without long-term current use of insulin ([facility])       Relevant Orders   Comprehensive metabolic panel   Hemoglobin A1c         No orders of the defined types were placed in this encounter.   IAnn Held, DO, personally preformed the services described in this documentation.  All medical record entries made by the scribe were at my direction and in my presence.  I have reviewed the chart and discharge instructions (if applicable) and agree that the record reflects my personal performance and is accurate and complete. 01/17/2022   I,Mohammed Iqbal,acting as a scribe for [name] Held, DO.,have documented all relevant documentation on the behalf of [name] Held, DO,as directed by  [name] Held, DO while in the presence of [name] Held, DO.     [name] Held, DO

## 2022-01-17 NOTE — Assessment & Plan Note (Signed)
Encourage heart healthy diet such as MIND or DASH diet, increase exercise, avoid trans fats, simple carbohydrates and processed foods, consider a krill or fish or flaxseed oil cap daily.  °

## 2022-01-17 NOTE — Assessment & Plan Note (Signed)
Cleared for surgery 

## 2022-01-17 NOTE — Addendum Note (Signed)
Addended by: Sanda Linger on: 01/17/2022 04:27 PM   Modules accepted: Orders

## 2022-01-17 NOTE — Assessment & Plan Note (Signed)
Pt cleared for surgery  

## 2022-01-18 LAB — MICROALBUMIN / CREATININE URINE RATIO
Creatinine,U: 61.5 mg/dL
Microalb Creat Ratio: 1.1 mg/g (ref 0.0–30.0)
Microalb, Ur: <0.7 mg/dL (ref 0.0–1.9)

## 2022-01-21 ENCOUNTER — Encounter: Payer: Self-pay | Admitting: Neurology

## 2022-01-24 ENCOUNTER — Encounter: Payer: Self-pay | Admitting: Family Medicine

## 2022-01-25 ENCOUNTER — Other Ambulatory Visit: Payer: Self-pay | Admitting: Family Medicine

## 2022-01-25 DIAGNOSIS — D729 Disorder of white blood cells, unspecified: Secondary | ICD-10-CM

## 2022-02-05 ENCOUNTER — Ambulatory Visit: Payer: 59 | Admitting: Neurology

## 2022-02-05 ENCOUNTER — Other Ambulatory Visit (HOSPITAL_BASED_OUTPATIENT_CLINIC_OR_DEPARTMENT_OTHER): Payer: Self-pay | Admitting: Family Medicine

## 2022-02-05 DIAGNOSIS — G4721 Circadian rhythm sleep disorder, delayed sleep phase type: Secondary | ICD-10-CM

## 2022-02-05 DIAGNOSIS — Z8669 Personal history of other diseases of the nervous system and sense organs: Secondary | ICD-10-CM

## 2022-02-05 DIAGNOSIS — G4733 Obstructive sleep apnea (adult) (pediatric): Secondary | ICD-10-CM | POA: Diagnosis not present

## 2022-02-05 DIAGNOSIS — E661 Drug-induced obesity: Secondary | ICD-10-CM

## 2022-02-05 DIAGNOSIS — R634 Abnormal weight loss: Secondary | ICD-10-CM

## 2022-02-05 DIAGNOSIS — Z1231 Encounter for screening mammogram for malignant neoplasm of breast: Secondary | ICD-10-CM

## 2022-02-07 NOTE — Progress Notes (Signed)
              [facility] TEST REPORT ( by Watch PAT)   STUDY DATE:  02-07-2022 DOB: July 14, 1946    ORDERING CLINICIAN: Larey Seat, MD  REFERRING CLINICIAN:    CLINICAL INFORMATION/HISTORY: HST 07-15-2020, HST positive for OSA, AHI 18, strongly REM sleep dependent. CPAP treatment recommended. This patient presented on 01-09-2022 with still untreated sleep apnea, and was re-referred to discuss and evaluate memory loss. She is expected to undergo knee surgery 04-01-2022.  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  Delayed sleep phase syndrome.  We discussed we discussed improvement in sleep hygiene.  MOCA MoCAwas 27/ 30 , inattentive.  Epworth sleepiness score: 3/ 24. FSS at 39/63 points.   BMI: 38.5 kg/m   Neck Circumference: 16"   FINDINGS:   Sleep Summary:   Total Recording Time (hours, min): 8 h  18 m     Total Sleep Time (hours, min):  5 h 51 m               Percent REM (%):     6.3%                                   Respiratory Indices:   Calculated pAHI (per hour): 13.3/h                                                    Positional AHI:   supine sleep AHI 56.3/h , and non-supine sleep AHI was  10.9/h                                                 Oxygen Saturation Statistics:   O2 Saturation Range (%): mean 93% ,   78 through 100%                                    O2 Saturation (minutes) <89%:   2.7 minutes        Pulse Rate Statistics:   Pulse Mean (bpm):   65 bpm              Pulse Range: 49 through 121 bpm                IMPRESSION:  This HST confirms the presence of mild sleep apnea, and strong supine sleep dependent apnea. There was not enough REM sleep seen to make a valid REM sleep diagnosis this time. No hypoxia.    RECOMMENDATION: Do not sleep supine !  BMI reduction has let to reduced AHI and you will not need CPAP if you sleep off your back.  If you can  reduce your BMI further, go for it . Best of luck with your surgery!     INTERPRETING PHYSICIAN:   Larey Seat, MD   Medical Director of [facility] at [facility].

## 2022-02-11 ENCOUNTER — Telehealth: Payer: Self-pay | Admitting: *Deleted

## 2022-02-11 DIAGNOSIS — R634 Abnormal weight loss: Secondary | ICD-10-CM | POA: Insufficient documentation

## 2022-02-11 DIAGNOSIS — Z8669 Personal history of other diseases of the nervous system and sense organs: Secondary | ICD-10-CM | POA: Insufficient documentation

## 2022-02-11 DIAGNOSIS — E661 Drug-induced obesity: Secondary | ICD-10-CM | POA: Insufficient documentation

## 2022-02-11 NOTE — Telephone Encounter (Signed)
Called pt. Relayed results per Dr. Edwena Felty note. Pt has knee surgery 04/01/22. Has pre-op appt next week with them. Wondering if they need to know these results. Does not feel she can sleep on back post surgery.  Aware I will speak with MD to see if we need to get her set up on therapy before surgery since she does not feel she can sleep off back. Aware I will send mychart message with response today or tomorrow at the latest.

## 2022-02-11 NOTE — Telephone Encounter (Signed)
-----   Message from Larey Seat, MD sent at 02/11/2022  3:03 PM EDT ----- IMPRESSION:  This HST confirms the presence of mild sleep apnea, and strong supine sleep dependent apnea. There was not enough REM sleep seen to make a valid REM sleep diagnosis this time. No hypoxia.   RECOMMENDATION: Do not sleep supine !  BMI reduction has let to reduced AHI and you will not need CPAP if you sleep off your back.  If you can reduce your BMI further, go for it . Best of luck with your surgery!    INTERPRETING PHYSICIAN:   Larey Seat, MD   Medical Director of [facility] at [facility].

## 2022-02-11 NOTE — Procedures (Signed)
        [facility] TEST REPORT ( by Watch PAT)   STUDY DATE:  02-07-2022 DOB: 02/22/1946    ORDERING CLINICIAN: Larey Seat, MD  REFERRING CLINICIAN:    CLINICAL INFORMATION/HISTORY: HST 07-15-2020, HST positive for OSA, AHI 18, strongly REM sleep dependent. CPAP treatment recommended. This patient presented on 01-09-2022 with still untreated sleep apnea, and was re-referred to discuss and evaluate memory loss. She is expected to undergo knee surgery 04-01-2022.  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  Delayed sleep phase syndrome.  We discussed we discussed improvement in sleep hygiene.  MOCA MoCAwas 27/ 30 , inattentive.  Epworth sleepiness score: 3/ 24. FSS at 39/63 points.   BMI: 38.5 kg/m   Neck Circumference: 16"   FINDINGS:   Sleep Summary:   Total Recording Time (hours, min): 8 h  18 m     Total Sleep Time (hours, min):  5 h 51 m               Percent REM (%):     6.3%                                   Respiratory Indices:   Calculated pAHI (per hour): 13.3/h                                                    Positional AHI:   supine sleep AHI 56.3/h , and non-supine sleep AHI was  10.9/h   Snoring is mild.                                                Oxygen Saturation Statistics:   O2 Saturation Range (%): mean 93% ,   78 through 100%                                    O2 Saturation (minutes) <89%:   2.7 minutes        Pulse Rate Statistics:   Pulse Mean (bpm):   65 bpm              Pulse Range: 49 through 121 bpm                IMPRESSION:  This HST confirms the presence of mild sleep apnea, and strong supine sleep dependent apnea. There was not enough REM sleep seen to make a valid REM sleep diagnosis this time. No hypoxia.    RECOMMENDATION: Do not sleep supine !  BMI reduction has let to reduced AHI and you will not need CPAP if you sleep off your back.  If  you can reduce your BMI further, go for it . Best of luck with your surgery!     INTERPRETING PHYSICIAN:   Larey Seat, MD   Medical Director of [facility] at [facility].

## 2022-02-18 ENCOUNTER — Other Ambulatory Visit: Payer: Self-pay | Admitting: *Deleted

## 2022-02-18 DIAGNOSIS — G4733 Obstructive sleep apnea (adult) (pediatric): Secondary | ICD-10-CM

## 2022-02-25 ENCOUNTER — Ambulatory Visit (HOSPITAL_BASED_OUTPATIENT_CLINIC_OR_DEPARTMENT_OTHER): Payer: 59

## 2022-02-25 ENCOUNTER — Ambulatory Visit (HOSPITAL_BASED_OUTPATIENT_CLINIC_OR_DEPARTMENT_OTHER)
Admission: RE | Admit: 2022-02-25 | Discharge: 2022-02-25 | Disposition: A | Discharge status: Home / Self Care | Payer: 59 | Source: Ambulatory Visit | Attending: Acute Care | Admitting: Acute Care

## 2022-02-25 DIAGNOSIS — Z87891 Personal history of nicotine dependence: Secondary | ICD-10-CM | POA: Insufficient documentation

## 2022-02-27 ENCOUNTER — Other Ambulatory Visit: Payer: Self-pay

## 2022-02-27 DIAGNOSIS — Z87891 Personal history of nicotine dependence: Secondary | ICD-10-CM

## 2022-02-27 DIAGNOSIS — Z122 Encounter for screening for malignant neoplasm of respiratory organs: Secondary | ICD-10-CM

## 2022-03-05 NOTE — H&P (Addendum)
TOTAL KNEE ADMISSION H&P  Patient is being admitted for right total knee arthroplasty.  Subjective:  Chief Complaint: Right knee pain.  HPI: [name] [name], 76 y.o. female has a history of pain and functional disability in the right knee due to arthritis and has failed non-surgical conservative treatments for greater than 12 weeks to include NSAID's and/or analgesics, corticosteriod injections, and activity modification. Onset of symptoms was gradual, starting several years ago with gradually worsening course since that time. The patient noted no past surgery on the right knee.  Patient currently rates pain in the right knee at 7 out of 10 with activity. Patient has night pain, worsening of pain with activity and weight bearing, pain that interferes with activities of daily living, and crepitus. Patient has evidence of  bone-on-bone arthritis in the medial and patellofemoral compartments on the right  by imaging studies. There is no active infection.  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) 06/13/2020   MCI (mild cognitive impairment) 06/13/2020   Insulin resistance 05/09/2020   Insomnia 05/09/2020   Vitamin D deficiency 02/17/2019   Diarrhea 02/17/2019   Essential hypertension 02/17/2019   Class 3 severe obesity due to excess calories with serious  comorbidity and body mass index (BMI) of 40.0 to 44.9 in adult ([place]) 02/17/2019   Hair loss 02/17/2019   Anxiety 11/30/2017   Primary osteoarthritis of both knees 11/30/2017   Abnormal WBC count 11/30/2017   Obesity (BMI 30-39.9) 07/23/2013   Suspicious mole 02/01/2013   Palpitations 07/23/2012   Hyperlipidemia 01/28/2012   Morbid obesity ([place]) 04/24/2011   Preventative health care 02/20/2011   Skin lump of leg 10/10/2010   SORE THROAT 08/15/2010   LYMPHADENOPATHY 08/15/2010   TOBACCO USE 01/17/2010   Hyperlipidemia associated with type 2 diabetes mellitus ([place]) 04/05/2008   TINNITUS, CHRONIC, 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   ALLERGIC RHINITIS 01/25/2008   LIVER FUNCTION TESTS, ABNORMAL 01/25/2008   Hypothyroidism 01/13/2007   Depression with anxiety 01/13/2007   TRANSIENT GLOBAL AMNESIA 01/13/2007   FOOT SURGERY, HX OF 01/13/2007    Past Medical History:  Diagnosis Date   Allergic rhinitis    Arthritis    Cataract    Depression    Diabetes mellitus without complication ([place])    Emphysema lung ([place])    Early stage   Endometrial polyp    Hyperlipidemia    Hyperlipidemia    on simvastatin   Hypothyroidism    IBS (irritable bowel syndrome)    Obesity    Osteoarthritis    Seasonal allergies    Sleep apnea     Past Surgical History:  Procedure Laterality Date   BUNIONECTOMY  2009   Dr.Duda, left   CARPAL TUNNEL RELEASE Right 11/28/2021   Procedure: RIGHT CARPAL TUNNEL RELEASE;  Surgeon: Sherilyn Cooter, MD;  Location: [facility];  Service: Orthopedics;  Laterality: Right;  EYE SURGERY     HYSTEROSCOPY WITH D & C  04/24/2011   Procedure: DILATATION AND CURETTAGE (D&C) /HYSTEROSCOPY;  Surgeon: Eldred Manges, MD;  Location: [facility];  Service: Gynecology;  Laterality: N/A;   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 Authorizing Provider  ALPRAZolam (XANAX) 0.25 MG tablet Take 1 tablet (0.25 mg total) by mouth 2 (two) times daily as needed for anxiety. 05/18/21   [name], DO  Biotin 1 MG CAPS Take by mouth.    [provider]  Collagen-Vitamin C-Biotin (COLLAGEN 1500/C PO) Take by mouth.    [provider]  levothyroxine (SYNTHROID) 125 MCG tablet TAKE 1 TABLET BY MOUTH  DAILY BEFORE BREAKFAST 09/14/21   Carollee Herter, Kendrick Fries R, DO  loperamide (IMODIUM) 2 MG capsule TAKE 1 CAPSULE BY MOUTH  TWICE DAILY 05/03/21   Carollee Herter, Alferd Apa, DO  metFORMIN (GLUCOPHAGE) 500 MG tablet Take 1 tablet (500 mg total) by mouth daily with breakfast. 11/02/21   Carollee Herter, Alferd Apa, DO  multivitamin [facility]) per tablet Take 1 tablet by mouth daily.      [provider]  sertraline (ZOLOFT) 100 MG tablet Take 1 tablet (100 mg total) by mouth daily. 07/04/21   Roma Schanz R, DO  simvastatin (ZOCOR) 20 MG tablet TAKE 1 TABLET BY MOUTH AT  BEDTIME (NEED OFFICE VISIT  FOR FURTHER REFILLS) 11/02/21   Carollee Herter, Alferd Apa, DO  Vitamin D, Ergocalciferol, (DRISDOL) 1.25 MG (50000 UNIT) CAPS capsule TAKE 1 CAPSULE BY MOUTH  EVERY 7 DAYS 01/14/22   [name], DO    Allergies  Allergen Reactions   Tape     Red, swollen, itching    Social History   Socioeconomic History   Marital status: Married    Spouse name: [name] [name]   Number of children: 0   Years of education: Not on file   Highest education level: Master's degree (e.g., MA, MS, MEng, MEd, MSW, MBA)  Occupational History   Occupation: Daycare p/t    Comment: retired  Tobacco Use   Smoking status: Former    Packs/day: 2.00    Years: 42.00    Total pack years: 84.00    Types: Cigarettes    Quit date: 07/28/2012    Years since quitting: 9.6   Smokeless tobacco: Never  Vaping Use    Vaping Use: Former  Substance and Sexual Activity   Alcohol use: Yes    Comment: rare   Drug use: Never   Sexual activity: Yes    Partners: Male    Birth control/protection: Post-menopausal  Other Topics Concern   Not on file  Social History Narrative   Regular exercise- no    Lives with spouse   Caffeine- 2 teas daily   Social Determinants of Health   Financial Resource Strain: Not on file  Food Insecurity: Not on file  Transportation Needs: Not on file  Physical Activity: Not on file  Stress: Not on file  Social Connections: Not on file  Intimate Partner Violence: Not on file    Tobacco Use: Medium Risk (01/17/2022)   Patient History    Smoking  Tobacco Use: Former    Smokeless Tobacco Use: Never    Passive Exposure: Not on file   Social History   Substance and Sexual Activity  Alcohol Use Yes   Comment: rare    Family History  Problem Relation Age of Onset   Colon cancer Father 69   Cancer Father 34       colon   Alcoholism Father    Pancreatic cancer Mother 4   Cancer Mother 8       pancreatic   Colon cancer Paternal Grandmother    Cancer Paternal Grandmother 40       colon   Heart attack Maternal Grandmother 25   Heart disease Maternal Grandmother    Cancer Sister 42       melanoma   Breast cancer Sister    Cancer Brother 40       skin   Thyroid disease Sister    Breast cancer Maternal Aunt 30   Heart attack Maternal Aunt 60   Breast cancer Sister     Review of Systems  Constitutional:  Negative for chills and fever.  HENT: Negative.    Eyes: Negative.   Respiratory:  Negative for cough and shortness of breath.   Cardiovascular:  Negative for chest pain and palpitations.  Gastrointestinal:  Negative for abdominal pain, constipation, diarrhea, nausea and vomiting.  Genitourinary:  Negative for dysuria, frequency and urgency.  Musculoskeletal:  Positive for joint pain.  Skin:  Negative for rash.   Objective:  Physical Exam: Well  nourished and well developed.  General: Alert and oriented x3, cooperative and pleasant, no acute distress.  Head: normocephalic, atraumatic, neck supple.  Eyes: EOMI. Abdomen: non-tender to palpation and soft, normoactive bowel sounds. Musculoskeletal: The patient has an antalgic gait pattern favoring the right side.   Right Hip Exam:  The range of motion: normal without discomfort.   Right Knee Exam:  No effusion present. No swelling present.  The range of motion is: 0 to 110 degrees.  Marked crepitus on range of motion of the knee.  Positive medial joint line tenderness.  No lateral joint line tenderness.  The knee is stable.   Left Knee Exam:  No effusion present. No swelling present.  The range of motion is: 0 to 115 degrees.  No crepitus on range of motion of the knee.  Positive medial greater than lateral joint line tenderness.  The knee is stable.  Calves soft and nontender. Motor function intact in LE. Strength 5/5 LE bilaterally. Neuro: Distal pulses 2+. Sensation to light touch intact in LE.  Vital signs in last 24 hours: BP: ()/()  Arterial Line BP: ()/()   Imaging Review Plain radiographs demonstrate moderate degenerative joint disease of the right knee. The overall alignment is neutral. The bone quality appears to be adequate for age and reported activity level.  Assessment/Plan:  End stage arthritis, right knee   The patient history, physical examination, clinical judgment of the provider and imaging studies are consistent with end stage degenerative joint disease of the right knee and total knee arthroplasty is deemed medically necessary. The treatment options including medical management, injection therapy arthroscopy and arthroplasty were discussed at length. The risks and benefits of total knee arthroplasty were presented and reviewed. The risks due to aseptic loosening, infection, stiffness, patella tracking problems, thromboembolic complications and other  imponderables were discussed. The patient acknowledged the explanation, agreed to proceed with the plan and consent was signed. Patient is being admitted for inpatient treatment  for surgery, pain control, PT, OT, prophylactic antibiotics, VTE prophylaxis, progressive ambulation and ADLs and discharge planning. The patient is planning to be discharged  home .  Therapy Plans: [place] Disposition: Home with Husband Planned DVT Prophylaxis: Aspirin 325 mg BID DME Needed: RW PCP: Roma Schanz, DO (clearance in note from 07/03) TXA: IV Allergies: adhesive tapes (redness, itching) Anesthesia Concerns: None BMI: 30.5 Last HgbA1c: 5.5 - last checked in July 2023  Pharmacy: [facility] [place], [place])  - Patient was instructed on what medications to stop prior to surgery. - Follow-up visit in 2 weeks with Dr. Wynelle Link - Begin physical therapy following surgery - Pre-operative lab work as pre-surgical testing - Prescriptions will be provided in hospital at time of discharge  R. Jaynie Bream, PA-C Orthopedic Surgery [facility] [place]

## 2022-03-12 ENCOUNTER — Other Ambulatory Visit (INDEPENDENT_AMBULATORY_CARE_PROVIDER_SITE_OTHER): Payer: 59

## 2022-03-12 ENCOUNTER — Telehealth: Payer: 59 | Admitting: Neurology

## 2022-03-12 ENCOUNTER — Telehealth: Payer: Self-pay | Admitting: Neurology

## 2022-03-12 DIAGNOSIS — D729 Disorder of white blood cells, unspecified: Secondary | ICD-10-CM

## 2022-03-12 LAB — CBC WITH DIFFERENTIAL/PLATELET
Basophils Absolute: 0 10*3/uL (ref 0.0–0.1)
Basophils Relative: 0.8 % (ref 0.0–3.0)
Eosinophils Absolute: 0.1 10*3/uL (ref 0.0–0.7)
Eosinophils Relative: 3 % (ref 0.0–5.0)
HCT: 38.2 % (ref 36.0–46.0)
Hemoglobin: 12.7 g/dL (ref 12.0–15.0)
Lymphocytes Relative: 28.8 % (ref 12.0–46.0)
Lymphs Abs: 1.1 10*3/uL (ref 0.7–4.0)
MCHC: 33.2 g/dL (ref 30.0–36.0)
MCV: 91.1 fl (ref 78.0–100.0)
Monocytes Absolute: 0.3 10*3/uL (ref 0.1–1.0)
Monocytes Relative: 7.6 % (ref 3.0–12.0)
Neutro Abs: 2.3 10*3/uL (ref 1.4–7.7)
Neutrophils Relative %: 59.8 % (ref 43.0–77.0)
Platelets: 152 10*3/uL (ref 150.0–400.0)
RBC: 4.19 Mil/uL (ref 3.87–5.11)
RDW: 14.9 % (ref 11.5–15.5)
WBC: 3.9 10*3/uL — ABNORMAL LOW (ref 4.0–10.5)

## 2022-03-12 NOTE — Telephone Encounter (Signed)
Called pt and LVM stating that she is needing to schedule her Initial Cpap visit. DME is in pt's SnapShot.

## 2022-03-13 NOTE — Telephone Encounter (Signed)
Pt was scheduled Initial CPAP visit on 06/03/22. Pt was informed to bring machine and power cord. DME: [facility] Phone: [phone] Fax: [phone] Equipment issued: AirSense 11 on 03/08/22 Pt to be scheduled between: 04/09/22-06/07/22

## 2022-03-13 NOTE — Patient Instructions (Signed)
SURGICAL WAITING ROOM VISITATION Patients having surgery or a procedure may have no more than 2 support people in the waiting area - these visitors may rotate.   Children under the age of 57 must have an adult with them who is not the patient. If the patient needs to stay at the hospital during part of their recovery, the visitor guidelines for inpatient rooms apply. Pre-op nurse will coordinate an appropriate time for 1 support person to accompany patient in pre-op.  This support person may not rotate.    Please refer to the [facility] website for the visitor guidelines for Inpatients (after your surgery is over and you are in a regular room).       Your procedure is scheduled on:  04/01/2022    Report to [facility] Main Entrance    Report to admitting at  0800 AM   Call this number if you have problems the morning of surgery [phone]   Do not eat food :After Midnight.   After Midnight you may have the following liquids until ___ 0730___ AM  DAY OF SURGERY  Water Non-Citrus Juices (without pulp, NO RED) Carbonated Beverages Black Coffee (NO MILK/CREAM OR CREAMERS, sugar ok)  Clear Tea (NO MILK/CREAM OR CREAMERS, sugar ok) regular and decaf                             Plain Jell-O (NO RED)                                           Fruit ices (not with fruit pulp, NO RED)                                     Popsicles (NO RED)                                                               Sports drinks like Gatorade (NO RED)                    The day of surgery:  Drink ONE (1) Pre-Surgery Clear Ensure or G2 at 0730 AM ( have completed by )  the morning of surgery. Drink in one sitting. Do not sip.  This drink was given to you during your hospital  pre-op appointment visit. Nothing else to drink after completing the  Pre-Surgery Clear Ensure or G2.         Oral Hygiene is also important to reduce your risk of infection.                                     Remember - BRUSH YOUR TEETH THE MORNING OF SURGERY WITH YOUR REGULAR TOOTHPASTE   Do NOT smoke after Midnight   Take these medicines the morning of surgery with A SIP OF WATER:  synthroid, zoloft   DO NOT TAKE ANY ORAL DIABETIC MEDICATIONS DAY OF YOUR SURGERY  Bring CPAP mask and tubing day  of surgery.                              You may not have any metal on your body including hair pins, jewelry, and body piercing             Do not wear make-up, lotions, powders, perfumes/cologne, or deodorant  Do not wear nail polish including gel and S&S, artificial/acrylic nails, or any other type of covering on natural nails including finger and toenails. If you have artificial nails, gel coating, etc. that needs to be removed by a nail salon please have this removed prior to surgery or surgery may need to be canceled/ delayed if the surgeon/ anesthesia feels like they are unable to be safely monitored.   Do not shave  48 hours prior to surgery.               Men may shave face and neck.   Do not bring valuables to the hospital. [place].   Contacts, dentures or bridgework may not be worn into surgery.   Bring small overnight bag day of surgery.   DO NOT [place]. PHARMACY WILL DISPENSE MEDICATIONS LISTED ON YOUR MEDICATION LIST TO YOU DURING YOUR ADMISSION [place]!    Patients discharged on the day of surgery will not be allowed to drive home.  Someone NEEDS to stay with you for the first 24 hours after anesthesia.   Special Instructions: Bring a copy of your healthcare power of attorney and living will documents         the day of surgery if you haven't scanned them before.              Please read over the following fact sheets you were given: IF YOU HAVE QUESTIONS ABOUT YOUR PRE-OP INSTRUCTIONS PLEASE CALL [phone]     [facility] - Preparing for Surgery Before surgery, you can play an  important role.  Because skin is not sterile, your skin needs to be as free of germs as possible.  You can reduce the number of germs on your skin by washing with CHG (chlorahexidine gluconate) soap before surgery.  CHG is an antiseptic cleaner which kills germs and bonds with the skin to continue killing germs even after washing. Please DO NOT use if you have an allergy to CHG or antibacterial soaps.  If your skin becomes reddened/irritated stop using the CHG and inform your nurse when you arrive at Short Stay. Do not shave (including legs and underarms) for at least 48 hours prior to the first CHG shower.  You may shave your face/neck. Please follow these instructions carefully:  1.  Shower with CHG Soap the night before surgery and the  morning of Surgery.  2.  If you choose to wash your hair, wash your hair first as usual with your  normal  shampoo.  3.  After you shampoo, rinse your hair and body thoroughly to remove the  shampoo.                           4.  Use CHG as you would any other liquid soap.  You can apply chg directly  to the skin and wash  Gently with a scrungie or clean washcloth.  5.  Apply the CHG Soap to your body ONLY FROM THE NECK DOWN.   Do not use on face/ open                           Wound or open sores. Avoid contact with eyes, ears mouth and genitals (private parts).                       Wash face,  Genitals (private parts) with your normal soap.             6.  Wash thoroughly, paying special attention to the area where your surgery  will be performed.  7.  Thoroughly rinse your body with warm water from the neck down.  8.  DO NOT shower/wash with your normal soap after using and rinsing off  the CHG Soap.                9.  Pat yourself dry with a clean towel.            10.  Wear clean pajamas.            11.  Place clean sheets on your bed the night of your first shower and do not  sleep with pets. Day of Surgery : Do not apply any  lotions/deodorants the morning of surgery.  Please wear clean clothes to the hospital/surgery center.  FAILURE TO FOLLOW THESE INSTRUCTIONS MAY RESULT IN THE CANCELLATION OF YOUR SURGERY PATIENT SIGNATURE_________________________________  NURSE SIGNATURE__________________________________  ________________________________________________________________________

## 2022-03-13 NOTE — Progress Notes (Addendum)
Anesthesia Review:  PCP: DR Garnet Koyanagi Chase- LOV 01/17/22  Cardiologist : none  Heart monitor- 05/2021  Chest x-ray : EKG : 01/17/22  02/05/22- Home Sleep Test  Echo : 05/27/21  Stress test: Cardiac Cath :  Activity level: cannot do  a flight of stiars without difficulty  Sleep Study/ CPAP : has cpap - pt aware to bring mask and tubing  Fasting Blood Sugar :      / Checks Blood Sugar -- times a day:   Blood Thinner/ Instructions /Last Dose: ASA / Instructions/ Last Dose :   03/11/22- CBC diff  DM- type - borderline per pt  Does  not check glucose at home  Hgba1c- 03/19/22-  CBC - 03/12/22- in epic.

## 2022-03-19 ENCOUNTER — Other Ambulatory Visit: Payer: Self-pay

## 2022-03-19 ENCOUNTER — Encounter (HOSPITAL_COMMUNITY)
Admission: RE | Admit: 2022-03-19 | Discharge: 2022-03-19 | Disposition: A | Discharge status: Home / Self Care | Payer: 59 | Source: Ambulatory Visit | Attending: Orthopedic Surgery | Admitting: Orthopedic Surgery

## 2022-03-19 ENCOUNTER — Encounter (HOSPITAL_COMMUNITY): Payer: Self-pay

## 2022-03-19 VITALS — BP 129/69 | HR 82 | Temp 98.6°F | Resp 16 | Ht <= 58 in | Wt 183.0 lb

## 2022-03-19 DIAGNOSIS — Z01812 Encounter for preprocedural laboratory examination: Secondary | ICD-10-CM | POA: Insufficient documentation

## 2022-03-19 DIAGNOSIS — Z01818 Encounter for other preprocedural examination: Secondary | ICD-10-CM

## 2022-03-19 HISTORY — DX: Anxiety disorder, unspecified: F41.9

## 2022-03-19 LAB — BASIC METABOLIC PANEL
Anion gap: 6 (ref 5–15)
BUN: 15 mg/dL (ref 8–23)
CO2: 29 mmol/L (ref 22–32)
Calcium: 9.3 mg/dL (ref 8.9–10.3)
Chloride: 108 mmol/L (ref 98–111)
Creatinine, Ser: 0.6 mg/dL (ref 0.44–1.00)
GFR, Estimated: >60 mL/min (ref >60)
Glucose, Bld: 104 mg/dL — ABNORMAL HIGH (ref 70–99)
Potassium: 4.4 mmol/L (ref 3.5–5.1)
Sodium: 143 mmol/L (ref 135–145)

## 2022-03-19 LAB — SURGICAL PCR SCREEN
MRSA, PCR: NEGATIVE
Staphylococcus aureus: NEGATIVE

## 2022-03-19 LAB — HEMOGLOBIN A1C
Hgb A1c MFr Bld: 5.3 % (ref 4.8–5.6)
Mean Plasma Glucose: 105.41 mg/dL

## 2022-03-19 LAB — GLUCOSE, CAPILLARY: Glucose-Capillary: 90 mg/dL (ref 70–99)

## 2022-03-26 ENCOUNTER — Ambulatory Visit (HOSPITAL_BASED_OUTPATIENT_CLINIC_OR_DEPARTMENT_OTHER)
Admission: RE | Admit: 2022-03-26 | Discharge: 2022-03-26 | Disposition: A | Discharge status: Home / Self Care | Payer: 59 | Source: Ambulatory Visit | Attending: Family Medicine | Admitting: Family Medicine

## 2022-03-26 ENCOUNTER — Encounter (HOSPITAL_BASED_OUTPATIENT_CLINIC_OR_DEPARTMENT_OTHER): Payer: Self-pay

## 2022-03-26 DIAGNOSIS — Z1231 Encounter for screening mammogram for malignant neoplasm of breast: Secondary | ICD-10-CM | POA: Insufficient documentation

## 2022-04-01 ENCOUNTER — Observation Stay (HOSPITAL_COMMUNITY)
Admission: RE | Admit: 2022-04-01 | Discharge: 2022-04-02 | Disposition: A | Discharge status: Home / Self Care | Payer: 59 | Attending: Orthopedic Surgery | Admitting: Orthopedic Surgery

## 2022-04-01 ENCOUNTER — Ambulatory Visit (HOSPITAL_BASED_OUTPATIENT_CLINIC_OR_DEPARTMENT_OTHER): Payer: 59 | Admitting: Certified Registered Nurse Anesthetist

## 2022-04-01 ENCOUNTER — Other Ambulatory Visit: Payer: Self-pay

## 2022-04-01 ENCOUNTER — Ambulatory Visit (HOSPITAL_COMMUNITY): Payer: 59 | Admitting: Physician Assistant

## 2022-04-01 ENCOUNTER — Encounter (HOSPITAL_COMMUNITY): Payer: Self-pay | Admitting: Orthopedic Surgery

## 2022-04-01 ENCOUNTER — Encounter (HOSPITAL_COMMUNITY)
Admission: RE | Disposition: A | Discharge status: Home / Self Care | Payer: Self-pay | Source: Home / Self Care | Attending: Orthopedic Surgery

## 2022-04-01 DIAGNOSIS — E119 Type 2 diabetes mellitus without complications: Secondary | ICD-10-CM | POA: Insufficient documentation

## 2022-04-01 DIAGNOSIS — J449 Chronic obstructive pulmonary disease, unspecified: Secondary | ICD-10-CM | POA: Diagnosis not present

## 2022-04-01 DIAGNOSIS — Z87891 Personal history of nicotine dependence: Secondary | ICD-10-CM | POA: Insufficient documentation

## 2022-04-01 DIAGNOSIS — Z9989 Dependence on other enabling machines and devices: Secondary | ICD-10-CM | POA: Diagnosis not present

## 2022-04-01 DIAGNOSIS — I10 Essential (primary) hypertension: Secondary | ICD-10-CM

## 2022-04-01 DIAGNOSIS — M1711 Unilateral primary osteoarthritis, right knee: Secondary | ICD-10-CM

## 2022-04-01 DIAGNOSIS — Z7984 Long term (current) use of oral hypoglycemic drugs: Secondary | ICD-10-CM | POA: Diagnosis not present

## 2022-04-01 DIAGNOSIS — Z79899 Other long term (current) drug therapy: Secondary | ICD-10-CM | POA: Diagnosis not present

## 2022-04-01 DIAGNOSIS — E039 Hypothyroidism, unspecified: Secondary | ICD-10-CM | POA: Diagnosis not present

## 2022-04-01 DIAGNOSIS — Z01818 Encounter for other preprocedural examination: Secondary | ICD-10-CM

## 2022-04-01 HISTORY — PX: TOTAL KNEE ARTHROPLASTY: SHX125

## 2022-04-01 LAB — GLUCOSE, CAPILLARY
Glucose-Capillary: 96 mg/dL (ref 70–99)
Glucose-Capillary: 98 mg/dL (ref 70–99)
Glucose-Capillary: 146 mg/dL — ABNORMAL HIGH (ref 70–99)
Glucose-Capillary: 157 mg/dL — ABNORMAL HIGH (ref 70–99)

## 2022-04-01 SURGERY — ARTHROPLASTY, KNEE, TOTAL
Anesthesia: Spinal | Site: Knee | Laterality: Right

## 2022-04-01 MED ORDER — ONDANSETRON HCL 4 MG PO TABS: 4.0000 mg | ORAL_TABLET | Freq: Four times a day (QID) | ORAL | Status: DC | PRN

## 2022-04-01 MED ORDER — ONDANSETRON HCL 4 MG/2ML IJ SOLN
INTRAMUSCULAR | Status: AC
  Filled 2022-04-01: qty 2

## 2022-04-01 MED ORDER — FENTANYL CITRATE PF 50 MCG/ML IJ SOSY
PREFILLED_SYRINGE | INTRAMUSCULAR | Status: AC
  Filled 2022-04-01: qty 1

## 2022-04-01 MED ORDER — BUPIVACAINE LIPOSOME 1.3 % IJ SUSP: 20.0000 mL | Freq: Once | INTRAMUSCULAR | Status: DC

## 2022-04-01 MED ORDER — EPHEDRINE 5 MG/ML INJ
INTRAVENOUS | Status: AC
  Filled 2022-04-01: qty 5

## 2022-04-01 MED ORDER — ACETAMINOPHEN 500 MG PO TABS
1000.0000 mg | ORAL_TABLET | Freq: Four times a day (QID) | ORAL | Status: AC
  Administered 2022-04-01 – 2022-04-02 (×4): 1000 mg via ORAL
  Filled 2022-04-01 (×4): qty 2

## 2022-04-01 MED ORDER — METHOCARBAMOL 500 MG IVPB - SIMPLE MED
500.0000 mg | Freq: Four times a day (QID) | INTRAVENOUS | Status: DC | PRN
  Administered 2022-04-01: 500 mg via INTRAVENOUS

## 2022-04-01 MED ORDER — SODIUM CHLORIDE (PF) 0.9 % IJ SOLN
INTRAMUSCULAR | Status: AC
  Filled 2022-04-01: qty 50

## 2022-04-01 MED ORDER — DOCUSATE SODIUM 100 MG PO CAPS
100.0000 mg | ORAL_CAPSULE | Freq: Two times a day (BID) | ORAL | Status: DC
  Administered 2022-04-01 – 2022-04-02 (×2): 100 mg via ORAL
  Filled 2022-04-01 (×2): qty 1

## 2022-04-01 MED ORDER — CHLORHEXIDINE GLUCONATE 0.12 % MT SOLN
15.0000 mL | Freq: Once | OROMUCOSAL | Status: AC
  Administered 2022-04-01: 15 mL via OROMUCOSAL

## 2022-04-01 MED ORDER — STERILE WATER FOR IRRIGATION IR SOLN
Status: DC | PRN
  Administered 2022-04-01: 2000 mL

## 2022-04-01 MED ORDER — INSULIN ASPART 100 UNIT/ML IJ SOLN: 0.0000 [IU] | Freq: Every day | INTRAMUSCULAR | Status: DC

## 2022-04-01 MED ORDER — FLEET ENEMA 7-19 GM/118ML RE ENEM: 1.0000 | ENEMA | Freq: Once | RECTAL | Status: DC | PRN

## 2022-04-01 MED ORDER — LIDOCAINE HCL (PF) 2 % IJ SOLN
INTRAMUSCULAR | Status: AC
  Filled 2022-04-01: qty 5

## 2022-04-01 MED ORDER — LACTATED RINGERS IV SOLN: INTRAVENOUS | Status: DC

## 2022-04-01 MED ORDER — METOCLOPRAMIDE HCL 5 MG/ML IJ SOLN: 5.0000 mg | Freq: Three times a day (TID) | INTRAMUSCULAR | Status: DC | PRN

## 2022-04-01 MED ORDER — BUPIVACAINE LIPOSOME 1.3 % IJ SUSP
INTRAMUSCULAR | Status: DC | PRN
  Administered 2022-04-01: 20 mL

## 2022-04-01 MED ORDER — LEVOTHYROXINE SODIUM 125 MCG PO TABS
125.0000 ug | ORAL_TABLET | Freq: Every day | ORAL | Status: DC
  Administered 2022-04-02: 125 ug via ORAL
  Filled 2022-04-01: qty 1

## 2022-04-01 MED ORDER — BISACODYL 10 MG RE SUPP: 10.0000 mg | Freq: Every day | RECTAL | Status: DC | PRN

## 2022-04-01 MED ORDER — SODIUM CHLORIDE 0.9 % IR SOLN
Status: DC | PRN
  Administered 2022-04-01: 1000 mL

## 2022-04-01 MED ORDER — METHOCARBAMOL 500 MG PO TABS
500.0000 mg | ORAL_TABLET | Freq: Four times a day (QID) | ORAL | Status: DC | PRN
  Administered 2022-04-01 – 2022-04-02 (×2): 500 mg via ORAL
  Filled 2022-04-01 (×2): qty 1

## 2022-04-01 MED ORDER — MIDAZOLAM HCL 2 MG/2ML IJ SOLN
1.0000 mg | INTRAMUSCULAR | Status: DC
  Administered 2022-04-01: 1 mg via INTRAVENOUS
  Filled 2022-04-01: qty 2

## 2022-04-01 MED ORDER — SODIUM CHLORIDE 0.9 % IV SOLN: INTRAVENOUS | Status: DC

## 2022-04-01 MED ORDER — DEXAMETHASONE SODIUM PHOSPHATE 10 MG/ML IJ SOLN: 8.0000 mg | Freq: Once | INTRAMUSCULAR | Status: DC

## 2022-04-01 MED ORDER — PHENYLEPHRINE 80 MCG/ML (10ML) SYRINGE FOR IV PUSH (FOR BLOOD PRESSURE SUPPORT)
PREFILLED_SYRINGE | INTRAVENOUS | Status: AC
  Filled 2022-04-01: qty 10

## 2022-04-01 MED ORDER — DEXAMETHASONE SODIUM PHOSPHATE 10 MG/ML IJ SOLN
INTRAMUSCULAR | Status: AC
  Filled 2022-04-01: qty 1

## 2022-04-01 MED ORDER — MORPHINE SULFATE (PF) 2 MG/ML IV SOLN
1.0000 mg | INTRAVENOUS | Status: DC | PRN
  Administered 2022-04-01: 2 mg via INTRAVENOUS
  Filled 2022-04-01: qty 1

## 2022-04-01 MED ORDER — ASPIRIN 325 MG PO TBEC
325.0000 mg | DELAYED_RELEASE_TABLET | Freq: Two times a day (BID) | ORAL | Status: DC
  Administered 2022-04-02: 325 mg via ORAL
  Filled 2022-04-01: qty 1

## 2022-04-01 MED ORDER — SODIUM CHLORIDE (PF) 0.9 % IJ SOLN
INTRAMUSCULAR | Status: AC
  Filled 2022-04-01: qty 10

## 2022-04-01 MED ORDER — OXYCODONE HCL 5 MG/5ML PO SOLN: 5.0000 mg | Freq: Once | ORAL | Status: AC | PRN

## 2022-04-01 MED ORDER — BUPIVACAINE IN DEXTROSE 0.75-8.25 % IT SOLN
INTRATHECAL | Status: DC | PRN
  Administered 2022-04-01: 1.4 mL via INTRATHECAL

## 2022-04-01 MED ORDER — FENTANYL CITRATE PF 50 MCG/ML IJ SOSY
25.0000 ug | PREFILLED_SYRINGE | INTRAMUSCULAR | Status: DC | PRN
  Administered 2022-04-01 (×2): 50 ug via INTRAVENOUS

## 2022-04-01 MED ORDER — DIPHENHYDRAMINE HCL 12.5 MG/5ML PO ELIX: 12.5000 mg | ORAL_SOLUTION | ORAL | Status: DC | PRN

## 2022-04-01 MED ORDER — LIDOCAINE 2% (20 MG/ML) 5 ML SYRINGE
INTRAMUSCULAR | Status: DC | PRN
  Administered 2022-04-01: 60 mg via INTRAVENOUS

## 2022-04-01 MED ORDER — ONDANSETRON HCL 4 MG/2ML IJ SOLN: 4.0000 mg | Freq: Four times a day (QID) | INTRAMUSCULAR | Status: DC | PRN

## 2022-04-01 MED ORDER — 0.9 % SODIUM CHLORIDE (POUR BTL) OPTIME
TOPICAL | Status: DC | PRN
  Administered 2022-04-01: 1000 mL

## 2022-04-01 MED ORDER — ONDANSETRON HCL 4 MG/2ML IJ SOLN
4.0000 mg | Freq: Once | INTRAMUSCULAR | Status: AC | PRN
  Administered 2022-04-01: 4 mg via INTRAVENOUS

## 2022-04-01 MED ORDER — OXYCODONE HCL 5 MG PO TABS
ORAL_TABLET | ORAL | Status: AC
  Filled 2022-04-01: qty 1

## 2022-04-01 MED ORDER — POLYVINYL ALCOHOL 1.4 % OP SOLN: 1.0000 [drp] | OPHTHALMIC | Status: DC | PRN

## 2022-04-01 MED ORDER — BUPIVACAINE LIPOSOME 1.3 % IJ SUSP
INTRAMUSCULAR | Status: AC
  Filled 2022-04-01: qty 20

## 2022-04-01 MED ORDER — PHENOL 1.4 % MT LIQD: 1.0000 | OROMUCOSAL | Status: DC | PRN

## 2022-04-01 MED ORDER — FENTANYL CITRATE PF 50 MCG/ML IJ SOSY
50.0000 ug | PREFILLED_SYRINGE | INTRAMUSCULAR | Status: DC
  Administered 2022-04-01: 50 ug via INTRAVENOUS
  Filled 2022-04-01: qty 2

## 2022-04-01 MED ORDER — SODIUM CHLORIDE (PF) 0.9 % IJ SOLN
INTRAMUSCULAR | Status: DC | PRN
  Administered 2022-04-01: 60 mL

## 2022-04-01 MED ORDER — PROPYLENE GLYCOL 0.6 % OP SOLN: 1.0000 [drp] | OPHTHALMIC | Status: DC | PRN

## 2022-04-01 MED ORDER — INSULIN ASPART 100 UNIT/ML IJ SOLN
0.0000 [IU] | Freq: Three times a day (TID) | INTRAMUSCULAR | Status: DC
  Administered 2022-04-01: 2 [IU] via SUBCUTANEOUS

## 2022-04-01 MED ORDER — CEFAZOLIN SODIUM-DEXTROSE 2-4 GM/100ML-% IV SOLN
2.0000 g | INTRAVENOUS | Status: AC
  Administered 2022-04-01: 2 g via INTRAVENOUS
  Filled 2022-04-01: qty 100

## 2022-04-01 MED ORDER — CYCLOSPORINE 0.05 % OP EMUL
1.0000 [drp] | Freq: Every day | OPHTHALMIC | Status: DC
  Administered 2022-04-02: 1 [drp] via OPHTHALMIC
  Filled 2022-04-01: qty 30

## 2022-04-01 MED ORDER — OXYCODONE HCL 5 MG PO TABS
5.0000 mg | ORAL_TABLET | ORAL | Status: DC | PRN
  Administered 2022-04-02 (×3): 5 mg via ORAL
  Filled 2022-04-01 (×3): qty 1

## 2022-04-01 MED ORDER — SIMVASTATIN 20 MG PO TABS: 20.0000 mg | ORAL_TABLET | Freq: Every day | ORAL | Status: DC

## 2022-04-01 MED ORDER — POVIDONE-IODINE 10 % EX SWAB: 2.0000 | Freq: Once | CUTANEOUS | Status: DC

## 2022-04-01 MED ORDER — MENTHOL 3 MG MT LOZG: 1.0000 | LOZENGE | OROMUCOSAL | Status: DC | PRN

## 2022-04-01 MED ORDER — ORAL CARE MOUTH RINSE: 15.0000 mL | Freq: Once | OROMUCOSAL | Status: AC

## 2022-04-01 MED ORDER — OXYCODONE HCL 5 MG PO TABS
5.0000 mg | ORAL_TABLET | Freq: Once | ORAL | Status: AC | PRN
  Administered 2022-04-01: 5 mg via ORAL

## 2022-04-01 MED ORDER — ACETAMINOPHEN 10 MG/ML IV SOLN: 1000.0000 mg | Freq: Four times a day (QID) | INTRAVENOUS | Status: DC

## 2022-04-01 MED ORDER — CEFAZOLIN SODIUM-DEXTROSE 2-4 GM/100ML-% IV SOLN
2.0000 g | Freq: Four times a day (QID) | INTRAVENOUS | Status: AC
  Administered 2022-04-01 (×2): 2 g via INTRAVENOUS
  Filled 2022-04-01 (×2): qty 100

## 2022-04-01 MED ORDER — OXYCODONE HCL 5 MG PO TABS
10.0000 mg | ORAL_TABLET | ORAL | Status: DC | PRN
  Administered 2022-04-01 (×2): 10 mg via ORAL
  Filled 2022-04-01 (×2): qty 2

## 2022-04-01 MED ORDER — DEXAMETHASONE SODIUM PHOSPHATE 10 MG/ML IJ SOLN
INTRAMUSCULAR | Status: DC | PRN
  Administered 2022-04-01: 10 mg via INTRAVENOUS

## 2022-04-01 MED ORDER — PROPOFOL 1000 MG/100ML IV EMUL
INTRAVENOUS | Status: AC
  Filled 2022-04-01: qty 100

## 2022-04-01 MED ORDER — POLYETHYLENE GLYCOL 3350 17 G PO PACK: 17.0000 g | PACK | Freq: Every day | ORAL | Status: DC | PRN

## 2022-04-01 MED ORDER — METHOCARBAMOL 500 MG IVPB - SIMPLE MED
INTRAVENOUS | Status: AC
  Filled 2022-04-01: qty 55

## 2022-04-01 MED ORDER — PROPOFOL 500 MG/50ML IV EMUL
INTRAVENOUS | Status: DC | PRN
  Administered 2022-04-01: 50 ug/kg/min via INTRAVENOUS
  Administered 2022-04-01: 50 mg via INTRAVENOUS

## 2022-04-01 MED ORDER — GABAPENTIN 300 MG PO CAPS
300.0000 mg | ORAL_CAPSULE | Freq: Three times a day (TID) | ORAL | Status: DC
  Administered 2022-04-01 – 2022-04-02 (×3): 300 mg via ORAL
  Filled 2022-04-01 (×3): qty 1

## 2022-04-01 MED ORDER — ACETAMINOPHEN 500 MG PO TABS
1000.0000 mg | ORAL_TABLET | Freq: Once | ORAL | Status: AC
  Administered 2022-04-01: 1000 mg via ORAL
  Filled 2022-04-01: qty 2

## 2022-04-01 MED ORDER — TRANEXAMIC ACID-NACL 1000-0.7 MG/100ML-% IV SOLN
1000.0000 mg | INTRAVENOUS | Status: AC
  Administered 2022-04-01: 1000 mg via INTRAVENOUS
  Filled 2022-04-01: qty 100

## 2022-04-01 MED ORDER — METOCLOPRAMIDE HCL 5 MG PO TABS: 5.0000 mg | ORAL_TABLET | Freq: Three times a day (TID) | ORAL | Status: DC | PRN

## 2022-04-01 MED ORDER — SERTRALINE HCL 100 MG PO TABS
100.0000 mg | ORAL_TABLET | Freq: Every day | ORAL | Status: DC
  Administered 2022-04-02: 100 mg via ORAL
  Filled 2022-04-01: qty 1

## 2022-04-01 SURGICAL SUPPLY — 55 items
ATTUNE PS FEM RT SZ 5 CEM KNEE (Femur) IMPLANT
ATTUNE PSRP INSE SZ5 7 KNEE (Insert) IMPLANT
BAG COUNTER SPONGE SURGICOUNT (BAG) IMPLANT
BAG SPEC THK2 15X12 ZIP CLS (MISCELLANEOUS) ×1
BAG SPNG CNTER NS LX DISP (BAG)
BAG ZIPLOCK 12X15 (MISCELLANEOUS) ×1 IMPLANT
BASEPLATE TIBIAL ROTATING SZ 4 (Knees) IMPLANT
BLADE SAG 18X100X1.27 (BLADE) ×1 IMPLANT
BLADE SAW SGTL 11.0X1.19X90.0M (BLADE) ×1 IMPLANT
BNDG ELASTIC 6X5.8 VLCR STR LF (GAUZE/BANDAGES/DRESSINGS) ×1 IMPLANT
BOWL SMART MIX CTS (DISPOSABLE) ×1 IMPLANT
BSPLAT TIB 4 CMNT ROT PLAT STR (Knees) ×1 IMPLANT
CEMENT HV SMART SET (Cement) ×2 IMPLANT
CLOSURE STERI STRIP 1/2 X4 (GAUZE/BANDAGES/DRESSINGS) IMPLANT
COVER SURGICAL LIGHT HANDLE (MISCELLANEOUS) ×1 IMPLANT
CUFF TOURN SGL QUICK 34 (TOURNIQUET CUFF) ×1
CUFF TRNQT CYL 34X4.125X (TOURNIQUET CUFF) ×1 IMPLANT
DRAPE INCISE IOBAN 66X45 STRL (DRAPES) ×1 IMPLANT
DRAPE U-SHAPE 47X51 STRL (DRAPES) ×1 IMPLANT
DRSG AQUACEL AG ADV 3.5X10 (GAUZE/BANDAGES/DRESSINGS) ×1 IMPLANT
DURAPREP 26ML APPLICATOR (WOUND CARE) ×1 IMPLANT
ELECT REM PT RETURN 15FT ADLT (MISCELLANEOUS) ×1 IMPLANT
GLOVE BIO SURGEON STRL SZ 6.5 (GLOVE) IMPLANT
GLOVE BIO SURGEON STRL SZ7.5 (GLOVE) IMPLANT
GLOVE BIO SURGEON STRL SZ8 (GLOVE) ×1 IMPLANT
GLOVE BIOGEL PI IND STRL 6.5 (GLOVE) IMPLANT
GLOVE BIOGEL PI IND STRL 7.0 (GLOVE) IMPLANT
GLOVE BIOGEL PI IND STRL 8 (GLOVE) ×1 IMPLANT
GOWN STRL REUS W/ TWL LRG LVL3 (GOWN DISPOSABLE) ×1 IMPLANT
GOWN STRL REUS W/ TWL XL LVL3 (GOWN DISPOSABLE) IMPLANT
GOWN STRL REUS W/TWL LRG LVL3 (GOWN DISPOSABLE) ×1
GOWN STRL REUS W/TWL XL LVL3 (GOWN DISPOSABLE)
HANDPIECE INTERPULSE COAX TIP (DISPOSABLE) ×1
HOLDER FOLEY CATH W/STRAP (MISCELLANEOUS) IMPLANT
IMMOBILIZER KNEE 20 (SOFTGOODS) ×1
IMMOBILIZER KNEE 20 THIGH 36 (SOFTGOODS) ×1 IMPLANT
KIT TURNOVER KIT A (KITS) IMPLANT
MANIFOLD NEPTUNE II (INSTRUMENTS) ×1 IMPLANT
NS IRRIG 1000ML POUR BTL (IV SOLUTION) ×1 IMPLANT
PACK TOTAL KNEE CUSTOM (KITS) ×1 IMPLANT
PADDING CAST COTTON 6X4 STRL (CAST SUPPLIES) ×2 IMPLANT
PATELLA MEDIAL ATTUN 35MM KNEE (Knees) IMPLANT
PROTECTOR NERVE ULNAR (MISCELLANEOUS) ×1 IMPLANT
SET HNDPC FAN SPRY TIP SCT (DISPOSABLE) ×1 IMPLANT
SPIKE FLUID TRANSFER (MISCELLANEOUS) ×1 IMPLANT
STRIP CLOSURE SKIN 1/2X4 (GAUZE/BANDAGES/DRESSINGS) ×2 IMPLANT
SUT MNCRL AB 4-0 PS2 18 (SUTURE) ×1 IMPLANT
SUT STRATAFIX 0 PDS 27 VIOLET (SUTURE) ×1
SUT VIC AB 2-0 CT1 27 (SUTURE) ×3
SUT VIC AB 2-0 CT1 TAPERPNT 27 (SUTURE) ×3 IMPLANT
SUTURE STRATFX 0 PDS 27 VIOLET (SUTURE) ×1 IMPLANT
TRAY FOLEY MTR SLVR 16FR STAT (SET/KITS/TRAYS/PACK) ×1 IMPLANT
TUBE SUCTION HIGH CAP CLEAR NV (SUCTIONS) ×1 IMPLANT
WATER STERILE IRR 1000ML POUR (IV SOLUTION) ×2 IMPLANT
WRAP KNEE MAXI GEL POST OP (GAUZE/BANDAGES/DRESSINGS) ×1 IMPLANT

## 2022-04-01 NOTE — Plan of Care (Signed)
  Problem: Education: Goal: Knowledge of the prescribed therapeutic regimen will improve Outcome: Progressing   Problem: Activity: Goal: Range of joint motion will improve Outcome: Progressing   Problem: Pain Management: Goal: Pain level will decrease with appropriate interventions Outcome: Progressing   Problem: Safety: Goal: Ability to remain free from injury will improve Outcome: Progressing   

## 2022-04-01 NOTE — Interval H&P Note (Signed)
History and Physical Interval Note:  04/01/2022 8:25 AM  [name] [name]  has presented today for surgery, with the diagnosis of right knee osteoarthritis.  The various methods of treatment have been discussed with the patient and family. After consideration of risks, benefits and other options for treatment, the patient has consented to  Procedure(s): TOTAL KNEE ARTHROPLASTY (Right) as a surgical intervention.  The patient's history has been reviewed, patient examined, no change in status, stable for surgery.  I have reviewed the patient's chart and labs.  Questions were answered to the patient's satisfaction.     Pilar Plate Seema Blum

## 2022-04-01 NOTE — Op Note (Signed)
OPERATIVE REPORT-TOTAL KNEE ARTHROPLASTY   Pre-operative diagnosis- Osteoarthritis  Right knee(s)  Post-operative diagnosis- Osteoarthritis Right knee(s)  Procedure-  Right  Total Knee Arthroplasty  Surgeon- Dione Plover. Jakeline Dave, MD  Assistant- Molli Barrows, PA-C   Anesthesia-   Adductor canal block and spinal  EBL- 25 ml   Drains None  Tourniquet time-  Total Tourniquet Time Documented: Thigh (Left) - 33 minutes Total: Thigh (Left) - 33 minutes     Complications- None  Condition-PACU - hemodynamically stable.   Brief Clinical Note  [name] [name] is a 76 y.o. year old female with end stage OA of her right knee with progressively worsening pain and dysfunction. She has constant pain, with activity and at rest and significant functional deficits with difficulties even with ADLs. She has had extensive non-op management including analgesics, injections of cortisone and viscosupplements, and home exercise program, but remains in significant pain with significant dysfunction.Radiographs show bone on bone arthritis medial and patellofemoral. She presents now for right Total Knee Arthroplasty.     Procedure in detail---   The patient is brought into the operating room and positioned supine on the operating table. After successful administration of  Adductor canal block and spinal,   a tourniquet is placed high on the  Right thigh(s) and the lower extremity is prepped and draped in the usual sterile fashion. Time out is performed by the operating team and then the  Right lower extremity is wrapped in Esmarch, knee flexed and the tourniquet inflated to 300 mmHg.       A midline incision is made with a ten blade through the subcutaneous tissue to the level of the extensor mechanism. A fresh blade is used to make a medial parapatellar arthrotomy. Soft tissue over the proximal medial tibia is subperiosteally elevated to the joint line with a knife and into the semimembranosus bursa with a Cobb  elevator. Soft tissue over the proximal lateral tibia is elevated with attention being paid to avoiding the patellar tendon on the tibial tubercle. The patella is everted, knee flexed 90 degrees and the ACL and PCL are removed. Findings are bone on bone medial and patellofemoral with large global osteophytes.        The drill is used to create a starting hole in the distal femur and the canal is thoroughly irrigated with sterile saline to remove the fatty contents. The 5 degree Right  valgus alignment guide is placed into the femoral canal and the distal femoral cutting block is pinned to remove 9 mm off the distal femur. Resection is made with an oscillating saw.      The tibia is subluxed forward and the menisci are removed. The extramedullary alignment guide is placed referencing proximally at the medial aspect of the tibial tubercle and distally along the second metatarsal axis and tibial crest. The block is pinned to remove 10m off the more deficient medial  side. Resection is made with an oscillating saw. Size 4is the most appropriate size for the tibia and the proximal tibia is prepared with the modular drill and keel punch for that size.      The femoral sizing guide is placed and size 5 is most appropriate. Rotation is marked off the epicondylar axis and confirmed by creating a rectangular flexion gap at 90 degrees. The size 5 cutting block is pinned in this rotation and the anterior, posterior and chamfer cuts are made with the oscillating saw. The intercondylar block is then placed and that cut is  made.      Trial size 4 tibial component, trial size 5 posterior stabilized femur and a 7  mm posterior stabilized rotating platform insert trial is placed. Full extension is achieved with excellent varus/valgus and anterior/posterior balance throughout full range of motion. The patella is everted and thickness measured to be 22  mm. Free hand resection is taken to 12 mm, a 35 template is placed, lug holes  are drilled, trial patella is placed, and it tracks normally. Osteophytes are removed off the posterior femur with the trial in place. All trials are removed and the cut bone surfaces prepared with pulsatile lavage. Cement is mixed and once ready for implantation, the size 4 tibial implant, size  5 posterior stabilized femoral component, and the size 35 patella are cemented in place and the patella is held with the clamp. The trial insert is placed and the knee held in full extension. The Exparel (20 ml mixed with 60 ml saline) is injected into the extensor mechanism, posterior capsule, medial and lateral gutters and subcutaneous tissues.  All extruded cement is removed and once the cement is hard the permanent 8 mm posterior stabilized rotating platform insert is placed into the tibial tray.      The wound is copiously irrigated with saline solution and the extensor mechanism closed with # 0 Stratofix suture. The tourniquet is released for a total tourniquet time of 33  minutes. Flexion against gravity is 140 degrees and the patella tracks normally. Subcutaneous tissue is closed with 2.0 vicryl and subcuticular with running 4.0 Monocryl. The incision is cleaned and dried and steri-strips and a bulky sterile dressing are applied. The limb is placed into a knee immobilizer and the patient is awakened and transported to recovery in stable condition.      Please note that a surgical assistant was a medical necessity for this procedure in order to perform it in a safe and expeditious manner. Surgical assistant was necessary to retract the ligaments and vital neurovascular structures to prevent injury to them and also necessary for proper positioning of the limb to allow for anatomic placement of the prosthesis.   Dione Plover Delesha Pohlman, MD    04/01/2022, 11:30 AM

## 2022-04-01 NOTE — Transfer of Care (Signed)
Immediate Anesthesia Transfer of Care Note  Patient: [name] [name]  Procedure(s) Performed: TOTAL KNEE ARTHROPLASTY (Right: Knee)  Patient Location: PACU  Anesthesia Type:Spinal  Level of Consciousness: awake  Airway & Oxygen Therapy: Patient Spontanous Breathing and Patient connected to face mask oxygen  Post-op Assessment: Report given to RN and Post -op Vital signs reviewed and stable  Post vital signs: Reviewed and stable  Last Vitals:  Vitals Value Taken Time  BP 115/95 04/01/22 1156  Temp    Pulse 58 04/01/22 1158  Resp 16 04/01/22 1158  SpO2 98 % 04/01/22 1158  Vitals shown include unvalidated device data.  Last Pain:  Vitals:   04/01/22 0837  TempSrc: Oral  PainSc:          Complications: No notable events documented.

## 2022-04-01 NOTE — Anesthesia Procedure Notes (Signed)
Spinal  Patient location during procedure: OR Start time: 04/01/2022 10:33 AM Reason for block: surgical anesthesia Staffing Performed: resident/CRNA  Anesthesiologist: Audry Pili, MD Resident/CRNA: Gerald Leitz, CRNA Performed by: Gerald Leitz, CRNA Authorized by: Audry Pili, MD   Preanesthetic Checklist Completed: patient identified, IV checked, site marked, risks and benefits discussed, surgical consent, monitors and equipment checked, pre-op evaluation and timeout performed Spinal Block Patient position: sitting Prep: DuraPrep Patient monitoring: heart rate, continuous pulse ox, blood pressure and cardiac monitor Approach: midline Location: L3-4 Injection technique: single-shot Needle Needle type: Whitacre and Introducer  Needle gauge: 25 G Needle length: 9 cm Assessment Sensory level: T4 Events: CSF return Additional Notes Negative paresthesia. Negative blood return. Positive free-flowing CSF. Expiration date of kit checked and confirmed. Patient tolerated procedure well, without complications.

## 2022-04-01 NOTE — Evaluation (Signed)
Physical Therapy Evaluation Patient Details Name: [name] [name] MRN: [id number] DOB: Apr 22, 1946 Today's Date: 04/01/2022  History of Present Illness  Pt is a 76yo female presenting s/p R-TKA on 04/01/22. PMH: DM, Emphysema, HLD, Hypothyroidism, IBS, OSA on CPAP, R-carpal tunnel release 11/28/21, mild cognitive impairment  Clinical Impression  [name] [name] is a 76 y.o. female POD 0 s/p R-TKA. Patient reports modified independence using SPC for mobility at baseline. Patient is now limited by functional impairments (see PT problem list below) and requires supervision for bed mobility and min guard for transfers. Patient was able to ambulate 15 feet with RW and min guard level of assist. Patient instructed in exercise to facilitate ROM and circulation to manage edema. Provided incentive spirometer and with Vcs pt able to achieve 1511m. Patient will benefit from continued skilled PT interventions to address impairments and progress towards PLOF. Acute PT will follow to progress mobility and stair training in preparation for safe discharge home.       Recommendations for follow up therapy are one component of a multi-disciplinary discharge planning process, led by the attending physician.  Recommendations may be updated based on patient status, additional functional criteria and insurance authorization.  Follow Up Recommendations Follow physician's recommendations for discharge plan and follow up therapies      Assistance Recommended at Discharge Intermittent Supervision/Assistance  Patient can return home with the following  A little help with walking and/or transfers;A little help with bathing/dressing/bathroom;Assistance with cooking/housework;Assist for transportation;Help with stairs or ramp for entrance    Equipment Recommendations None recommended by PT  Recommendations for Other Services       Functional Status Assessment Patient has had a recent decline in their functional status and  demonstrates the ability to make significant improvements in function in a reasonable and predictable amount of time.     Precautions / Restrictions Precautions Precautions: Fall;Knee Precaution Booklet Issued: No Precaution Comments: no pillow under knee Restrictions Weight Bearing Restrictions: No Other Position/Activity Restrictions: wbat      Mobility  Bed Mobility Overal bed mobility: Needs Assistance Bed Mobility: Supine to Sit     Supine to sit: Supervision     General bed mobility comments: For safety only, no physical assist required    Transfers Overall transfer level: Needs assistance Equipment used: Rolling walker (2 wheels) Transfers: Sit to/from Stand Sit to Stand: Min guard, From elevated surface           General transfer comment: Min guard for safety only, no physical assist required from elevated surface, multimodal cues for sequencing and powering up with BUE on bed. Pt completed transfer x4 by pt request "I want to get it right and practice"    Ambulation/Gait Ambulation/Gait assistance: Min guard, +2 safety/equipment Gait Distance (Feet): 15 Feet Assistive device: Rolling walker (2 wheels) Gait Pattern/deviations: Step-to pattern Gait velocity: decreased     General Gait Details: Pt ambulated with Rw and min guard, +2 for recliner follow, no physical assist required or overt LOB noted.  Stairs            Wheelchair Mobility    Modified Rankin (Stroke Patients Only)       Balance Overall balance assessment: Needs assistance Sitting-balance support: Feet supported, No upper extremity supported Sitting balance-Leahy Scale: Good     Standing balance support: During functional activity, Reliant on assistive device for balance, Bilateral upper extremity supported Standing balance-Leahy Scale: Poor  Pertinent Vitals/Pain Pain Assessment Pain Assessment: 0-10 Pain Score: 2  Pain  Location: right knee Pain Descriptors / Indicators: Operative site guarding Pain Intervention(s): Limited activity within patient's tolerance, Monitored during session, Repositioned, Ice applied    Home Living Family/patient expects to be discharged to:: Private residence Living Arrangements: Spouse/significant other Available Help at Discharge: Family;Available 24 hours/day Type of Home: House Home Access: Stairs to enter Entrance Stairs-Rails: None Entrance Stairs-Number of Steps: 3 Alternate Level Stairs-Number of Steps: 14 Home Layout: Two level;Bed/bath upstairs Home Equipment: Shower seat - built in;Grab bars - tub/shower;Rolling Walker (2 wheels);Rollator (4 wheels);Cane - single point      Prior Function Prior Level of Function : Independent/Modified Independent             Mobility Comments: SPC ADLs Comments: IND     Hand Dominance        Extremity/Trunk Assessment   Upper Extremity Assessment Upper Extremity Assessment: Overall WFL for tasks assessed    Lower Extremity Assessment Lower Extremity Assessment: RLE deficits/detail;LLE deficits/detail RLE Deficits / Details: MMT ank DF/PF 5/5, no extensor lag noted RLE Sensation: WNL LLE Deficits / Details: MMT ank DF/PF 5/5 LLE Sensation: WNL    Cervical / Trunk Assessment Cervical / Trunk Assessment: Kyphotic  Communication   Communication: No difficulties  Cognition Arousal/Alertness: Awake/alert Behavior During Therapy: WFL for tasks assessed/performed Overall Cognitive Status: Within Functional Limits for tasks assessed                                          General Comments General comments (skin integrity, edema, etc.): Hsuband Joe present for session    Exercises Total Joint Exercises Ankle Circles/Pumps: AROM, Both, 10 reps   Assessment/Plan    PT Assessment Patient needs continued PT services  PT Problem List Decreased strength;Decreased range of motion;Decreased  activity tolerance;Decreased balance;Decreased mobility;Decreased coordination;Pain       PT Treatment Interventions DME instruction;Gait training;Stair training;Functional mobility training;Therapeutic activities;Therapeutic exercise;Balance training;Neuromuscular re-education;Patient/family education    PT Goals (Current goals can be found in the Care Plan section)  Acute Rehab PT Goals Patient Stated Goal: to walk without pain PT Goal Formulation: With patient Time For Goal Achievement: 04/08/22 Potential to Achieve Goals: Good    Frequency 7X/week     Co-evaluation               AM-PAC PT "6 Clicks" Mobility  Outcome Measure Help needed turning from your back to your side while in a flat bed without using bedrails?: None Help needed moving from lying on your back to sitting on the side of a flat bed without using bedrails?: A Little Help needed moving to and from a bed to a chair (including a wheelchair)?: A Little Help needed standing up from a chair using your arms (e.g., wheelchair or bedside chair)?: A Little Help needed to walk in hospital room?: A Little Help needed climbing 3-5 steps with a railing? : A Little 6 Click Score: 19    End of Session Equipment Utilized During Treatment: Gait belt Activity Tolerance: No increased pain;Patient tolerated treatment well Patient left: in chair;with call bell/phone within reach;with chair alarm set;with family/visitor present;with SCD's reapplied Nurse Communication: Mobility status PT Visit Diagnosis: Pain;Difficulty in walking, not elsewhere classified (R26.2) Pain - Right/Left: Right Pain - part of body: Knee    Time: 1526-1601 PT Time Calculation (min) (ACUTE ONLY): 35  min   Charges:   PT Evaluation $PT Eval Low Complexity: 1 Low PT Treatments $Gait Training: 8-22 mins        Coolidge Breeze, PT, DPT [facility] Office: [phone]  Coolidge Breeze 04/01/2022, 6:29 PM

## 2022-04-01 NOTE — Anesthesia Preprocedure Evaluation (Addendum)
Anesthesia Evaluation  Patient identified by MRN, date of birth, ID band Patient awake    Reviewed: Allergy & Precautions, NPO status , Patient's Chart, lab work & pertinent test results  History of Anesthesia Complications Negative for: history of anesthetic complications  Airway Mallampati: I  TM Distance: >3 FB Neck ROM: Full    Dental  (+) Dental Advisory Given, Teeth Intact   Pulmonary sleep apnea and Continuous Positive Airway Pressure Ventilation , COPD, former smoker,    Pulmonary exam normal        Cardiovascular hypertension, Normal cardiovascular exam   '22 TTE - EF 60 to 65%. There is mild left ventricular hypertrophy. Left ventricular diastolic parameters are consistent with Grade I diastolic dysfunction (impaired relaxation). Mild AI and MR.     Neuro/Psych PSYCHIATRIC DISORDERS Anxiety Depression  Mild cognitive impairment     GI/Hepatic negative GI ROS, Neg liver ROS,   Endo/Other  diabetes, Type 2, Oral Hypoglycemic AgentsHypothyroidism Morbid obesity IBS   Renal/GU negative Renal ROS     Musculoskeletal  (+) Arthritis ,   Abdominal   Peds  Hematology negative hematology ROS (+)   Anesthesia Other Findings   Reproductive/Obstetrics                            Anesthesia Physical Anesthesia Plan  ASA: 3  Anesthesia Plan: Spinal   Post-op Pain Management: Regional block* and Tylenol PO (pre-op)*   Induction:   PONV Risk Score and Plan: 2 and Treatment may vary due to age or medical condition and Propofol infusion  Airway Management Planned: Natural Airway and Simple Face Mask  Additional Equipment: None  Intra-op Plan:   Post-operative Plan:   Informed Consent: I have reviewed the patients History and Physical, chart, labs and discussed the procedure including the risks, benefits and alternatives for the proposed anesthesia with the patient or authorized  representative who has indicated his/her understanding and acceptance.       Plan Discussed with: CRNA and Anesthesiologist  Anesthesia Plan Comments: (Labs reviewed, platelets acceptable. Discussed risks and benefits of spinal, including spinal/epidural hematoma, infection, failed block, and PDPH. Patient expressed understanding and wished to proceed. )       Anesthesia Quick Evaluation

## 2022-04-01 NOTE — Discharge Instructions (Addendum)
 Frank Aluisio, MD Total Joint Specialist [facility] [address] [place], [place] [address] [phone]  TOTAL KNEE REPLACEMENT POSTOPERATIVE DIRECTIONS    Knee Rehabilitation, Guidelines Following Surgery  Results after knee surgery are often greatly improved when you follow the exercise, range of motion and muscle strengthening exercises prescribed by your doctor. Safety measures are also important to protect the knee from further injury. If any of these exercises cause you to have increased pain or swelling in your knee joint, decrease the amount until you are comfortable again and slowly increase them. If you have problems or questions, call your caregiver or physical therapist for advice.   BLOOD CLOT PREVENTION Take a 325 mg Aspirin two times a day for three weeks following surgery. Then take an 81 mg Aspirin once a day for three weeks. Then discontinue Aspirin. You may resume your vitamins/supplements upon discharge from the hospital. Do not take any NSAIDs (Advil, Aleve, Ibuprofen, Meloxicam, etc.) until you have discontinued the 325 mg Aspirin.  GABAPENTIN INSTRUCTIONS Take a 300 mg capsule three times a day for two weeks following surgery.Then take a 300 mg capsule two times a day for two weeks. Then take a 300 mg capsule once a day for two weeks. Then discontinue.  HOME CARE INSTRUCTIONS  Remove items at home which could result in a fall. This includes throw rugs or furniture in walking pathways.  ICE to the affected knee as much as tolerated. Icing helps control swelling. If the swelling is well controlled you will be more comfortable and rehab easier. Continue to use ice on the knee for pain and swelling from surgery. You may notice swelling that will progress down to the foot and ankle. This is normal after surgery. Elevate the leg when you are not up walking on it.    Continue to use the breathing machine which will help keep your temperature  down. It is common for your temperature to cycle up and down following surgery, especially at night when you are not up moving around and exerting yourself. The breathing machine keeps your lungs expanded and your temperature down. Do not place pillow under the operative knee, focus on keeping the knee straight while resting  DIET You may resume your previous home diet once you are discharged from the hospital.  DRESSING / WOUND CARE / SHOWERING Keep your bulky bandage on for 2 days. On the third post-operative day you may remove the Ace bandage and gauze. There is a waterproof adhesive bandage on your skin which will stay in place until your first follow-up appointment. Once you remove this you will not need to place another bandage You may begin showering 3 days following surgery, but do not submerge the incision under water.  ACTIVITY For the first 5 days, the key is rest and control of pain and swelling Do your home exercises twice a day starting on post-operative day 3. On the days you go to physical therapy, just do the home exercises once that day. You should rest, ice and elevate the leg for 50 minutes out of every hour. Get up and walk/stretch for 10 minutes per hour. After 5 days you can increase your activity slowly as tolerated. Walk with your walker as instructed. Use the walker until you are comfortable transitioning to a cane. Walk with the cane in the opposite hand of the operative leg. You may discontinue the cane once you are comfortable and walking steadily. Avoid periods of inactivity such   as sitting longer than an hour when not asleep. This helps prevent blood clots.  You may discontinue the knee immobilizer once you are able to perform a straight leg raise while lying down. You may resume a sexual relationship in one month or when given the OK by your doctor.  You may return to work once you are cleared by your doctor.  Do not drive a car for 6 weeks or until released by your  surgeon.  Do not drive while taking narcotics.  TED HOSE STOCKINGS Wear the elastic stockings on both legs for three weeks following surgery during the day. You may remove them at night for sleeping.  WEIGHT BEARING Weight bearing as tolerated with assist device (walker, cane, etc) as directed, use it as long as suggested by your surgeon or therapist, typically at least 4-6 weeks.  POSTOPERATIVE CONSTIPATION PROTOCOL Constipation - defined medically as fewer than three stools per week and severe constipation as less than one stool per week.  One of the most common issues patients have following surgery is constipation.  Even if you have a regular bowel pattern at home, your normal regimen is likely to be disrupted due to multiple reasons following surgery.  Combination of anesthesia, postoperative narcotics, change in appetite and fluid intake all can affect your bowels.  In order to avoid complications following surgery, here are some recommendations in order to help you during your recovery period.  Colace (docusate) - Pick up an over-the-counter form of Colace or another stool softener and take twice a day as long as you are requiring postoperative pain medications.  Take with a full glass of water daily.  If you experience loose stools or diarrhea, hold the colace until you stool forms back up. If your symptoms do not get better within 1 week or if they get worse, check with your doctor. Dulcolax (bisacodyl) - Pick up over-the-counter and take as directed by the product packaging as needed to assist with the movement of your bowels.  Take with a full glass of water.  Use this product as needed if not relieved by Colace only.  MiraLax (polyethylene glycol) - Pick up over-the-counter to have on hand. MiraLax is a solution that will increase the amount of water in your bowels to assist with bowel movements.  Take as directed and can mix with a glass of water, juice, soda, coffee, or tea. Take if you  go more than two days without a movement. Do not use MiraLax more than once per day. Call your doctor if you are still constipated or irregular after using this medication for 7 days in a row.  If you continue to have problems with postoperative constipation, please contact the office for further assistance and recommendations.  If you experience "the worst abdominal pain ever" or develop nausea or vomiting, please contact the office immediatly for further recommendations for treatment.  ITCHING If you experience itching with your medications, try taking only a single pain pill, or even half a pain pill at a time.  You can also use Benadryl over the counter for itching or also to help with sleep.   MEDICATIONS See your medication summary on the "After Visit Summary" that the nursing staff will review with you prior to discharge.  You may have some home medications which will be placed on hold until you complete the course of blood thinner medication.  It is important for you to complete the blood thinner medication as prescribed by your surgeon.    Continue your approved medications as instructed at time of discharge.  PRECAUTIONS If you experience chest pain or shortness of breath - call 911 immediately for transfer to the hospital emergency department.  If you develop a fever greater that 101 F, purulent drainage from wound, increased redness or drainage from wound, foul odor from the wound/dressing, or calf pain - CONTACT YOUR SURGEON.                                                   FOLLOW-UP APPOINTMENTS Make sure you keep all of your appointments after your operation with your surgeon and caregivers. You should call the office at the above phone number and make an appointment for approximately two weeks after the date of your surgery or on the date instructed by your surgeon outlined in the "After Visit Summary".  RANGE OF MOTION AND STRENGTHENING EXERCISES  Rehabilitation of the knee is  important following a knee injury or an operation. After just a few days of immobilization, the muscles of the thigh which control the knee become weakened and shrink (atrophy). Knee exercises are designed to build up the tone and strength of the thigh muscles and to improve knee motion. Often times heat used for twenty to thirty minutes before working out will loosen up your tissues and help with improving the range of motion but do not use heat for the first two weeks following surgery. These exercises can be done on a training (exercise) mat, on the floor, on a table or on a bed. Use what ever works the best and is most comfortable for you Knee exercises include:  Leg Lifts - While your knee is still immobilized in a splint or cast, you can do straight leg raises. Lift the leg to 60 degrees, hold for 3 sec, and slowly lower the leg. Repeat 10-20 times 2-3 times daily. Perform this exercise against resistance later as your knee gets better.  Quad and Hamstring Sets - Tighten up the muscle on the front of the thigh (Quad) and hold for 5-10 sec. Repeat this 10-20 times hourly. Hamstring sets are done by pushing the foot backward against an object and holding for 5-10 sec. Repeat as with quad sets.  Leg Slides: Lying on your back, slowly slide your foot toward your buttocks, bending your knee up off the floor (only go as far as is comfortable). Then slowly slide your foot back down until your leg is flat on the floor again. Angel Wings: Lying on your back spread your legs to the side as far apart as you can without causing discomfort.  A rehabilitation program following serious knee injuries can speed recovery and prevent re-injury in the future due to weakened muscles. Contact your doctor or a physical therapist for more information on knee rehabilitation.   POST-OPERATIVE OPIOID TAPER INSTRUCTIONS: It is important to wean off of your opioid medication as soon as possible. If you do not need pain medication  after your surgery it is ok to stop day one. Opioids include: Codeine, Hydrocodone(Norco, Vicodin), Oxycodone(Percocet, oxycontin) and hydromorphone amongst others.  Long term and even short term use of opiods can cause: Increased pain response Dependence Constipation Depression Respiratory depression And more.  Withdrawal symptoms can include Flu like symptoms Nausea, vomiting And more Techniques to manage these symptoms Hydrate well Eat regular healthy meals  Stay active Use relaxation techniques(deep breathing, meditating, yoga) Do Not substitute Alcohol to help with tapering If you have been on opioids for less than two weeks and do not have pain than it is ok to stop all together.  Plan to wean off of opioids This plan should start within one week post op of your joint replacement. Maintain the same interval or time between taking each dose and first decrease the dose.  Cut the total daily intake of opioids by one tablet each day Next start to increase the time between doses. The last dose that should be eliminated is the evening dose.   IF YOU ARE TRANSFERRED TO A SKILLED REHAB FACILITY If the patient is transferred to a skilled rehab facility following release from the hospital, a list of the current medications will be sent to the facility for the patient to continue.  When discharged from the skilled rehab facility, please have the facility set up the patient's Home Health Physical Therapy prior to being released. Also, the skilled facility will be responsible for providing the patient with their medications at time of release from the facility to include their pain medication, the muscle relaxants, and their blood thinner medication. If the patient is still at the rehab facility at time of the two week follow up appointment, the skilled rehab facility will also need to assist the patient in arranging follow up appointment in our office and any transportation needs.  MAKE SURE  YOU:  Understand these instructions.  Get help right away if you are not doing well or get worse.   DENTAL ANTIBIOTICS:  In most cases prophylactic antibiotics for Dental procdeures after total joint surgery are not necessary.  Exceptions are as follows:  1. History of prior total joint infection  2. Severely immunocompromised (Organ Transplant, cancer chemotherapy, Rheumatoid biologic medications such as Humera)  3. Poorly controlled diabetes (A1C &gt; 8.0, blood glucose over 200)  If you have one of these conditions, contact your surgeon for an antibiotic prescription, prior to your dental procedure.    Pick up stool softner and laxative for home use following surgery while on pain medications. Do not submerge incision under water. Please use good hand washing techniques while changing dressing each day. May shower starting three days after surgery. Please use a clean towel to pat the incision dry following showers. Continue to use ice for pain and swelling after surgery. Do not use any lotions or creams on the incision until instructed by your surgeon.  

## 2022-04-01 NOTE — Progress Notes (Signed)
Orthopedic Tech Progress Note Patient Details:  [name] [name] February 20, 1946 [id number]  CPM Right Knee CPM Right Knee: On Right Knee Flexion (Degrees): 40 Right Knee Extension (Degrees): 10  Post Interventions Patient Tolerated: Well  Vernona Rieger 04/01/2022, 12:19 PM

## 2022-04-01 NOTE — Progress Notes (Signed)
Orthopedic Tech Progress Note Patient Details:  [name] [name] 08/23/1945 [id number]  CPM Right Knee CPM Right Knee: Off Right Knee Flexion (Degrees): 40 Right Knee Extension (Degrees): 10  Post Interventions Patient Tolerated: Well CPM removed by PT prior to session. Vernona Rieger 04/01/2022, 4:17 PM

## 2022-04-01 NOTE — Anesthesia Postprocedure Evaluation (Signed)
Anesthesia Post Note  Patient: [name] [name]  Procedure(s) Performed: TOTAL KNEE ARTHROPLASTY (Right: Knee)     Patient location during evaluation: PACU Anesthesia Type: Spinal Level of consciousness: awake and alert Pain management: pain level controlled Vital Signs Assessment: post-procedure vital signs reviewed and stable Respiratory status: spontaneous breathing and respiratory function stable Cardiovascular status: blood pressure returned to baseline and stable Postop Assessment: spinal receding and no apparent nausea or vomiting Anesthetic complications: no   No notable events documented.  Last Vitals:  Vitals:   04/01/22 1300 04/01/22 1315  BP: 125/73 136/61  Pulse: (!) 59 (!) 58  Resp: 12 13  Temp: (!) 35.7 C   SpO2: 97% 96%    Last Pain:  Vitals:   04/01/22 1315  TempSrc:   PainSc: [place]

## 2022-04-02 ENCOUNTER — Other Ambulatory Visit (HOSPITAL_COMMUNITY): Payer: Self-pay

## 2022-04-02 ENCOUNTER — Encounter (HOSPITAL_COMMUNITY): Payer: Self-pay | Admitting: Orthopedic Surgery

## 2022-04-02 DIAGNOSIS — M1711 Unilateral primary osteoarthritis, right knee: Secondary | ICD-10-CM | POA: Diagnosis not present

## 2022-04-02 LAB — BASIC METABOLIC PANEL
Anion gap: 5 (ref 5–15)
BUN: 18 mg/dL (ref 8–23)
CO2: 29 mmol/L (ref 22–32)
Calcium: 8.6 mg/dL — ABNORMAL LOW (ref 8.9–10.3)
Chloride: 107 mmol/L (ref 98–111)
Creatinine, Ser: 0.62 mg/dL (ref 0.44–1.00)
GFR, Estimated: >60 mL/min (ref >60)
Glucose, Bld: 140 mg/dL — ABNORMAL HIGH (ref 70–99)
Potassium: 4.4 mmol/L (ref 3.5–5.1)
Sodium: 141 mmol/L (ref 135–145)

## 2022-04-02 LAB — GLUCOSE, CAPILLARY
Glucose-Capillary: 127 mg/dL — ABNORMAL HIGH (ref 70–99)
Glucose-Capillary: 120 mg/dL — ABNORMAL HIGH (ref 70–99)

## 2022-04-02 LAB — CBC
HCT: 34.4 % — ABNORMAL LOW (ref 36.0–46.0)
Hemoglobin: 10.9 g/dL — ABNORMAL LOW (ref 12.0–15.0)
MCH: 30.1 pg (ref 26.0–34.0)
MCHC: 31.7 g/dL (ref 30.0–36.0)
MCV: 95 fL (ref 80.0–100.0)
Platelets: 144 10*3/uL — ABNORMAL LOW (ref 150–400)
RBC: 3.62 MIL/uL — ABNORMAL LOW (ref 3.87–5.11)
RDW: 13.8 % (ref 11.5–15.5)
WBC: 7.8 10*3/uL (ref 4.0–10.5)
nRBC: 0 % (ref 0.0–0.2)

## 2022-04-02 MED ORDER — GABAPENTIN 300 MG PO CAPS: 300.0000 mg | ORAL_CAPSULE | ORAL | 0 refills | Status: DC

## 2022-04-02 MED ORDER — ASPIRIN 325 MG PO TBEC: 325.0000 mg | DELAYED_RELEASE_TABLET | Freq: Two times a day (BID) | ORAL | 0 refills | Status: AC

## 2022-04-02 MED ORDER — ORAL CARE MOUTH RINSE: 15.0000 mL | OROMUCOSAL | Status: DC | PRN

## 2022-04-02 MED ORDER — METHOCARBAMOL 500 MG PO TABS: 500.0000 mg | ORAL_TABLET | Freq: Four times a day (QID) | ORAL | 0 refills | Status: DC | PRN

## 2022-04-02 MED ORDER — OXYCODONE HCL 5 MG PO TABS: 5.0000 mg | ORAL_TABLET | Freq: Four times a day (QID) | ORAL | 0 refills | Status: DC | PRN

## 2022-04-02 MED ORDER — OXYCODONE HCL 5 MG PO TABS
5.0000 mg | ORAL_TABLET | Freq: Four times a day (QID) | ORAL | 0 refills | Status: DC | PRN
  Filled 2022-04-02: qty 42, 7d supply, fill #0

## 2022-04-02 NOTE — TOC Transition Note (Signed)
Transition of Care [facility]) - CM/SW Discharge Note   Patient Details  Name: [name] [name] MRN: [id number] Date of Birth: 03/28/1946  Transition of [facility]) CM/SW Contact:  Lennart Pall, LCSW Phone Number: 04/02/2022, 10:43 AM   Clinical Narrative:    Met with pt and confirming she has received youth RW via [employer] to room.  OPPT already set up with [facility] @ [employer].  No further TOC needs.   Final next level of care: OP Rehab Barriers to Discharge: No Barriers Identified   Patient Goals and CMS Choice Patient states their goals for this hospitalization and ongoing recovery are:: return home CMS Medicare.gov Compare Post Acute Care list provided to:: Patient Choice offered to / list presented to : Patient  Discharge Placement                       Discharge Plan and Services                DME Arranged: [facility] Rile youth DME Agency: [employer] Date DME Agency Contacted: 04/02/22   Representative spoke with at DME Agency: [facility] Determinants of Health ([place]) Interventions     Readmission Risk Interventions     No data to display

## 2022-04-02 NOTE — Progress Notes (Signed)
Physical Therapy Treatment Patient Details Name: [name] [name] MRN: [id number] DOB: 11-26-1945 Today's Date: 04/02/2022   History of Present Illness Pt is a 76yo female presenting s/p R-TKA on 04/01/22. PMH: DM, Emphysema, HLD, Hypothyroidism, IBS, OSA on CPAP, R-carpal tunnel release 11/28/21, mild cognitive impairment    PT Comments    POD # 1 pm session With Spouse present, "hands on" assisted with all transfers, amb in hallway as well as practicing stairs.  General stair comments: with Spouse present for "hands on" assisted using safety belt up 2 steps forward with walker at 25% VC's on proper tech.  Also practiced up forward with both hands on ONE rail. Completed seated TE's following HEP handout.  Addressed all mobility questions, discussed appropriate activity, educated on use of ICE.  Pt ready for D/C to home.   Recommendations for follow up therapy are one component of a multi-disciplinary discharge planning process, led by the attending physician.  Recommendations may be updated based on patient status, additional functional criteria and insurance authorization.  Follow Up Recommendations  Follow physician's recommendations for discharge plan and follow up therapies     Assistance Recommended at Discharge    Patient can return home with the following A little help with walking and/or transfers;A little help with bathing/dressing/bathroom;Assistance with cooking/housework;Assist for transportation;Help with stairs or ramp for entrance   Equipment Recommendations  None recommended by PT    Recommendations for Other Services       Precautions / Restrictions       Mobility  Bed Mobility               General bed mobility comments: OOB in recliner    Transfers Overall transfer level: Needs assistance Equipment used: Rolling walker (2 wheels) Transfers: Sit to/from Stand Sit to Stand: Supervision, Min guard           General transfer comment: 25% VC's on  proper hand placement and safety with turns.  Also assisted with a toilet transfer.    Ambulation/Gait Ambulation/Gait assistance: Supervision, Min guard Gait Distance (Feet): 42 Feet Assistive device: Rolling walker (2 wheels) Gait Pattern/deviations: Step-to pattern Gait velocity: decreased     General Gait Details: 25% VC's on proper sequencing as well as proper walker to self distance.  Also asissted with amb to and from bathroom.   Stairs Stairs: Yes Stairs assistance: Min guard, Supervision Stair Management: No rails, Step to pattern, Forwards, With walker Number of Stairs: 4 General stair comments: with Spouse present for "hands on" assisted using safety belt up 2 steps forward with walker at 25% VC's on proper tech.  Also practiced up forward with both hands on ONE rail.   Wheelchair Mobility    Modified Rankin (Stroke Patients Only)       Balance                                            Cognition Arousal/Alertness: Awake/alert Behavior During Therapy: WFL for tasks assessed/performed Overall Cognitive Status: Within Functional Limits for tasks assessed                                 General Comments: AxO x 3 very pleasant        Exercises  05 reps all seated TE's    General Comments  Pertinent Vitals/Pain Pain Assessment Pain Assessment: 0-10 Pain Score: 3  Pain Location: right knee Pain Descriptors / Indicators: Operative site guarding Pain Intervention(s): Monitored during session, Premedicated before session, Repositioned, Ice applied    Home Living                          Prior Function            PT Goals (current goals can now be found in the care plan section) Progress towards PT goals: Progressing toward goals    Frequency    7X/week      PT Plan Current plan remains appropriate    Co-evaluation              AM-PAC PT "6 Clicks" Mobility   Outcome Measure  Help  needed turning from your back to your side while in a flat bed without using bedrails?: A Little Help needed moving from lying on your back to sitting on the side of a flat bed without using bedrails?: A Little Help needed moving to and from a bed to a chair (including a wheelchair)?: A Little Help needed standing up from a chair using your arms (e.g., wheelchair or bedside chair)?: A Little Help needed to walk in hospital room?: A Little Help needed climbing 3-5 steps with a railing? : A Little 6 Click Score: 18    End of Session Equipment Utilized During Treatment: Gait belt Activity Tolerance: No increased pain;Patient tolerated treatment well Patient left: in chair;with call bell/phone within reach;with chair alarm set;with family/visitor present;with SCD's reapplied Nurse Communication: Mobility status PT Visit Diagnosis: Pain;Difficulty in walking, not elsewhere classified (R26.2) Pain - Right/Left: Right Pain - part of body: Knee     Time: 1424-1440 PT Time Calculation (min) (ACUTE ONLY): 16 min  Charges:  $Gait Training: 8-22 mins $Therapeutic Exercise: 8-22 mins                     Rica Koyanagi  PTA [place] Office M-F          [phone] Weekend pager [phone]

## 2022-04-02 NOTE — Progress Notes (Signed)
Physical Therapy Treatment Patient Details Name: [name] [name] MRN: [id number] DOB: 11/09/1945 Today's Date: 04/02/2022   History of Present Illness Pt is a 75yo female presenting s/p R-TKA on 04/01/22. PMH: DM, Emphysema, HLD, Hypothyroidism, IBS, OSA on CPAP, R-carpal tunnel release 11/28/21, mild cognitive impairment    PT Comments    POD # 1 am session  Pt AxO x 3 very pleasant.  OOB in recliner.  General transfer comment: 25% VC's on proper hand placement and safety with turns.  Also assisted with a toilet transfer.General Gait Details: 25% VC's on proper sequencing as well as proper walker to self distance.  Also asissted with amb to and from bathroom. Then returned to room to perform some TE's following HEP handout.  Instructed on proper tech, freq as well as use of ICE.   Will see pt again this afternoon when Spouse arrives before D/C to home.   Recommendations for follow up therapy are one component of a multi-disciplinary discharge planning process, led by the attending physician.  Recommendations may be updated based on patient status, additional functional criteria and insurance authorization.  Follow Up Recommendations  Follow physician's recommendations for discharge plan and follow up therapies     Assistance Recommended at Discharge    Patient can return home with the following A little help with walking and/or transfers;A little help with bathing/dressing/bathroom;Assistance with cooking/housework;Assist for transportation;Help with stairs or ramp for entrance   Equipment Recommendations  None recommended by PT    Recommendations for Other Services       Precautions / Restrictions       Mobility  Bed Mobility               General bed mobility comments: OOB in recliner    Transfers Overall transfer level: Needs assistance Equipment used: Rolling walker (2 wheels) Transfers: Sit to/from Stand Sit to Stand: Supervision, Min guard           General  transfer comment: 25% VC's on proper hand placement and safety with turns.  Also assisted with a toilet transfer.    Ambulation/Gait Ambulation/Gait assistance: Supervision, Min guard Gait Distance (Feet): 42 Feet Assistive device: Rolling walker (2 wheels) Gait Pattern/deviations: Step-to pattern Gait velocity: decreased     General Gait Details: 25% VC's on proper sequencing as well as proper walker to self distance.  Also asissted with amb to and from bathroom.   Stairs             Wheelchair Mobility    Modified Rankin (Stroke Patients Only)       Balance                                            Cognition Arousal/Alertness: Awake/alert Behavior During Therapy: WFL for tasks assessed/performed Overall Cognitive Status: Within Functional Limits for tasks assessed                                 General Comments: AxO x 3 very pleasant        Exercises  Total Knee Replacement TE's following HEP handout 10 reps B LE ankle pumps 05 reps towel squeezes 05 reps knee presses 05 reps heel slides  05 reps SAQ's 05 reps SLR's 05 reps ABD Educated on use of gait belt to assist with TE's  Followed by ICE     General Comments        Pertinent Vitals/Pain Pain Assessment Pain Assessment: 0-10 Pain Score: 3  Pain Location: right knee Pain Descriptors / Indicators: Operative site guarding Pain Intervention(s): Monitored during session, Premedicated before session, Repositioned, Ice applied    Home Living                          Prior Function            PT Goals (current goals can now be found in the care plan section) Progress towards PT goals: Progressing toward goals    Frequency    7X/week      PT Plan Current plan remains appropriate    Co-evaluation              AM-PAC PT "6 Clicks" Mobility   Outcome Measure  Help needed turning from your back to your side while in a flat bed  without using bedrails?: A Little Help needed moving from lying on your back to sitting on the side of a flat bed without using bedrails?: A Little Help needed moving to and from a bed to a chair (including a wheelchair)?: A Little Help needed standing up from a chair using your arms (e.g., wheelchair or bedside chair)?: A Little Help needed to walk in hospital room?: A Little Help needed climbing 3-5 steps with a railing? : A Little 6 Click Score: 18    End of Session Equipment Utilized During Treatment: Gait belt Activity Tolerance: No increased pain;Patient tolerated treatment well Patient left: in chair;with call bell/phone within reach;with chair alarm set;with family/visitor present;with SCD's reapplied Nurse Communication: Mobility status PT Visit Diagnosis: Pain;Difficulty in walking, not elsewhere classified (R26.2) Pain - Right/Left: Right Pain - part of body: Knee     Time: 0998-3382 PT Time Calculation (min) (ACUTE ONLY): 28 min  Charges:  $Gait Training: 8-22 mins $Therapeutic Exercise: 8-22 mins                     {Kieara Schwark  PTA Acute  [employer] M-F          [phone] Weekend pager [phone]

## 2022-04-02 NOTE — Progress Notes (Signed)
Patient discharged to home w/ husband. Given all belongings, instructions, equipment. Verbalized understanding of instructions. Escorted to pov via w/c.

## 2022-04-02 NOTE — Progress Notes (Signed)
Subjective: 1 Day Post-Op Procedure(s) (LRB): TOTAL KNEE ARTHROPLASTY (Right) Patient seen in rounds by Dr. Wynelle Link. Patient is well, and has had no acute complaints or problems. Denies SOB or chest pain. Denies calf pain. Foley cath removed this AM. Patient reports pain as mild.  Worked with physical therapy yesterday and ambulated 15'. We will continue physical therapy today.  Objective: Vital signs in last 24 hours: Temp:  [96.2 F (35.7 C)-98.2 F (36.8 C)] 98.2 F (36.8 C) (09/19 0432) Pulse Rate:  [57-76] 62 (09/19 0432) Resp:  [9-22] 16 (09/19 0432) BP: (109-157)/(55-98) 117/56 (09/19 0432) SpO2:  [92 %-100 %] 96 % (09/19 0432) Weight:  [83 kg] 83 kg (09/18 0834)  Intake/Output from previous day:  Intake/Output Summary (Last 24 hours) at 04/02/2022 0732 Last data filed at 04/02/2022 0600 Gross per 24 hour  Intake 3670.68 ml  Output 4875 ml  Net -1204.32 ml     Intake/Output this shift: No intake/output data recorded.  Labs: Recent Labs    04/02/22 0343  HGB 10.9*   Recent Labs    04/02/22 0343  WBC 7.8  RBC 3.62*  HCT 34.4*  PLT 144*   Recent Labs    04/02/22 0343  NA 141  K 4.4  CL 107  CO2 29  BUN 18  CREATININE 0.62  GLUCOSE 140*  CALCIUM 8.6*   No results for input(s): "LABPT", "INR" in the last 72 hours.  Exam: General - Patient is Alert and Oriented Extremity - Neurologically intact Neurovascular intact Sensation intact distally Dorsiflexion/Plantar flexion intact Dressing - dressing C/D/I Motor Function - intact, moving foot and toes well on exam.  Past Medical History:  Diagnosis Date   Allergic rhinitis    Anxiety    Arthritis    Cataract    Depression    Diabetes mellitus without complication (HCC)    borderline   Emphysema lung (HCC)    Early stage   Endometrial polyp    Hyperlipidemia    Hyperlipidemia    on simvastatin   Hypothyroidism    IBS (irritable bowel syndrome)    Obesity    Osteoarthritis     Seasonal allergies    Sleep apnea    cpap    Assessment/Plan: 1 Day Post-Op Procedure(s) (LRB): TOTAL KNEE ARTHROPLASTY (Right) Principal Problem:   Primary osteoarthritis of right knee Active Problems:   Osteoarthritis of right knee  Estimated body mass index is 39.6 kg/m as calculated from the following:   Height as of this encounter: '4\' 9"'$  (1.448 m).   Weight as of this encounter: 83 kg. Advance diet Up with therapy D/C IV fluids  Patient's anticipated LOS is less than 2 midnights, meeting these requirements: - Lives within 1 hour of care - Has a competent adult at home to recover with post-op - NO history of  - Chronic pain requiring opiods  - Coronary Artery Disease  - Heart failure  - Heart attack  - Stroke  - DVT/VTE  - Cardiac arrhythmia  - Respiratory Failure/COPD  - Renal failure  - Anemia  - Advanced Liver disease  DVT Prophylaxis - Aspirin Weight bearing as tolerated. Continue physical therapy.  Plan is to go Home after hospital stay. Expected discharge today pending progress with physical therapy and if meeting patient goals. Scheduled for OPPT at [facility]. Follow-up in clinic in 2 weeks.  The PDMP database was reviewed today prior to any opioid medications being prescribed to this patient.  R. Jaynie Bream,  PA-C Orthopedic Surgery [phone] 04/02/2022, 7:32 AM

## 2022-04-03 NOTE — Discharge Summary (Signed)
Physician Discharge Summary   Patient ID: [name] [name] MRN: [id number] DOB/AGE: 1945-10-09 76 y.o.  Admit date: 04/01/2022 Discharge date: 04/02/2022  Primary Diagnosis: Osteoarthritis, right knee   Admission Diagnoses:  Past Medical History:  Diagnosis Date   Allergic rhinitis    Anxiety    Arthritis    Cataract    Depression    Diabetes mellitus without complication ([place])    borderline   Emphysema lung (HCC)    Early stage   Endometrial polyp    Hyperlipidemia    Hyperlipidemia    on simvastatin   Hypothyroidism    IBS (irritable bowel syndrome)    Obesity    Osteoarthritis    Seasonal allergies    Sleep apnea    cpap   Discharge Diagnoses:   Principal Problem:   Primary osteoarthritis of right knee Active Problems:   Osteoarthritis of right knee  Estimated body mass index is 39.6 kg/m as calculated from the following:   Height as of this encounter: '4\' 9"'$  (1.448 m).   Weight as of this encounter: 83 kg.  Procedure:  Procedure(s) (LRB): TOTAL KNEE ARTHROPLASTY (Right)   Consults: None  HPI: [name] [name] is a 76 y.o. year old female with end stage OA of her right knee with progressively worsening pain and dysfunction. She has constant pain, with activity and at rest and significant functional deficits with difficulties even with ADLs. She has had extensive non-op management including analgesics, injections of cortisone and viscosupplements, and home exercise program, but remains in significant pain with significant dysfunction.Radiographs show bone on bone arthritis medial and patellofemoral. She presents now for right Total Knee Arthroplasty.     Laboratory Data: Admission on 04/01/2022, Discharged on 04/02/2022  Component Date Value Ref Range Status   Glucose-Capillary 04/01/2022 96  70 - 99 mg/dL Final   Glucose reference range applies only to samples taken after fasting for at least 8 hours.   Comment 1 04/01/2022 Notify RN   Final   Comment 2  04/01/2022 Document in Chart   Final   Glucose-Capillary 04/01/2022 98  70 - 99 mg/dL Final   Glucose reference range applies only to samples taken after fasting for at least 8 hours.   Glucose-Capillary 04/01/2022 146 (H)  70 - 99 mg/dL Final   Glucose reference range applies only to samples taken after fasting for at least 8 hours.   WBC 04/02/2022 7.8  4.0 - 10.5 K/uL Final   RBC 04/02/2022 3.62 (L)  3.87 - 5.11 MIL/uL Final   Hemoglobin 04/02/2022 10.9 (L)  12.0 - 15.0 g/dL Final   HCT 04/02/2022 34.4 (L)  36.0 - 46.0 % Final   MCV 04/02/2022 95.0  80.0 - 100.0 fL Final   MCH 04/02/2022 30.1  26.0 - 34.0 pg Final   MCHC 04/02/2022 31.7  30.0 - 36.0 g/dL Final   RDW 04/02/2022 13.8  11.5 - 15.5 % Final   Platelets 04/02/2022 144 (L)  150 - 400 K/uL Final   nRBC 04/02/2022 0.0  0.0 - 0.2 % Final   Performed at [facility], [place] [address], [place], [place] [address]   Sodium 04/02/2022 141  135 - 145 mmol/L Final   Potassium 04/02/2022 4.4  3.5 - 5.1 mmol/L Final   Chloride 04/02/2022 107  98 - 111 mmol/L Final   CO2 04/02/2022 29  22 - 32 mmol/L Final   Glucose, Bld 04/02/2022 140 (H)  70 - 99 mg/dL Final   Glucose  reference range applies only to samples taken after fasting for at least 8 hours.   BUN 04/02/2022 18  8 - 23 mg/dL Final   Creatinine, Ser 04/02/2022 0.62  0.44 - 1.00 mg/dL Final   Calcium 04/02/2022 8.6 (L)  8.9 - 10.3 mg/dL Final   GFR, Estimated 04/02/2022 >60  >60 mL/min Final   Comment: (NOTE) Calculated using the CKD-EPI Creatinine Equation (2021)    Anion gap 04/02/2022 5  5 - 15 Final   Performed at [facility], [place] [address], [place], [place] [address]   Glucose-Capillary 04/01/2022 157 (H)  70 - 99 mg/dL Final   Glucose reference range applies only to samples taken after fasting for at least 8 hours.   Glucose-Capillary 04/02/2022 127 (H)  70 - 99 mg/dL Final   Glucose reference range applies only to samples taken  after fasting for at least 8 hours.   Glucose-Capillary 04/02/2022 120 (H)  70 - 99 mg/dL Final   Glucose reference range applies only to samples taken after fasting for at least 8 hours.  Hospital Outpatient Visit on 03/19/2022  Component Date Value Ref Range Status   MRSA, PCR 03/19/2022 NEGATIVE  NEGATIVE Final   Staphylococcus aureus 03/19/2022 NEGATIVE  NEGATIVE Final   Comment: (NOTE) The Xpert SA Assay (FDA approved for NASAL specimens in patients 60 years of age and older), is one component of a comprehensive surveillance program. It is not intended to diagnose infection nor to guide or monitor treatment. Performed at [facility], [place] [address], [place], [place] [address]    Hgb A1c MFr Bld 03/19/2022 5.3  4.8 - 5.6 % Final   Comment: (NOTE) Pre diabetes:          5.7%-6.4%  Diabetes:              >6.4%  Glycemic control for   <7.0% adults with diabetes    Mean Plasma Glucose 03/19/2022 105.41  mg/dL Final   Performed at [facility], [place] [address], [place], [place] [address]   Sodium 03/19/2022 143  135 - 145 mmol/L Final   Potassium 03/19/2022 4.4  3.5 - 5.1 mmol/L Final   Chloride 03/19/2022 108  98 - 111 mmol/L Final   CO2 03/19/2022 29  22 - 32 mmol/L Final   Glucose, Bld 03/19/2022 104 (H)  70 - 99 mg/dL Final   Glucose reference range applies only to samples taken after fasting for at least 8 hours.   BUN 03/19/2022 15  8 - 23 mg/dL Final   Creatinine, Ser 03/19/2022 0.60  0.44 - 1.00 mg/dL Final   Calcium 03/19/2022 9.3  8.9 - 10.3 mg/dL Final   GFR, Estimated 03/19/2022 >60  >60 mL/min Final   Comment: (NOTE) Calculated using the CKD-EPI Creatinine Equation (2021)    Anion gap 03/19/2022 6  5 - 15 Final   Performed at [facility], [place] [address] [place], [place] [address]   Glucose-Capillary 03/19/2022 90  70 - 99 mg/dL Final   Glucose reference range applies only to samples taken after fasting for at least 8  hours.  Lab on 03/12/2022  Component Date Value Ref Range Status   WBC 03/12/2022 3.9 (L)  4.0 - 10.5 K/uL Final   RBC 03/12/2022 4.19  3.87 - 5.11 Mil/uL Final   Hemoglobin 03/12/2022 12.7  12.0 - 15.0 g/dL Final   HCT 03/12/2022 38.2  36.0 - 46.0 % Final   MCV 03/12/2022 91.1  78.0 - 100.0 fl Final   MCHC 03/12/2022 33.2  30.0 - 36.0 g/dL Final   RDW 03/12/2022 14.9  11.5 - 15.5 % Final   Platelets 03/12/2022 152.0  150.0 - 400.0 K/uL Final   Neutrophils Relative % 03/12/2022 59.8  43.0 - 77.0 % Final   Lymphocytes Relative 03/12/2022 28.8  12.0 - 46.0 % Final   Monocytes Relative 03/12/2022 7.6  3.0 - 12.0 % Final   Eosinophils Relative 03/12/2022 3.0  0.0 - 5.0 % Final   Basophils Relative 03/12/2022 0.8  0.0 - 3.0 % Final   Neutro Abs 03/12/2022 2.3  1.4 - 7.7 K/uL Final   Lymphs Abs 03/12/2022 1.1  0.7 - 4.0 K/uL Final   Monocytes Absolute 03/12/2022 0.3  0.1 - 1.0 K/uL Final   Eosinophils Absolute 03/12/2022 0.1  0.0 - 0.7 K/uL Final   Basophils Absolute 03/12/2022 0.0  0.0 - 0.1 K/uL Final     X-Rays:MM 3D SCREEN BREAST BILATERAL  Result Date: 03/27/2022 CLINICAL DATA:  Screening. EXAM: DIGITAL SCREENING BILATERAL MAMMOGRAM WITH TOMOSYNTHESIS AND CAD TECHNIQUE: Bilateral screening digital craniocaudal and mediolateral oblique mammograms were obtained. Bilateral screening digital breast tomosynthesis was performed. The images were evaluated with computer-aided detection. COMPARISON:  Previous exam(s). ACR Breast Density Category a: The breast tissue is almost entirely fatty. FINDINGS: There are no findings suspicious for malignancy. IMPRESSION: No mammographic evidence of malignancy. A result letter of this screening mammogram will be mailed directly to the patient. RECOMMENDATION: Screening mammogram in one year. (Code:[id number]-B-01Y) BI-RADS CATEGORY  1: Negative. Electronically Signed   By: Zerita Boers M.D.   On: 03/27/2022 13:39    EKG: Orders placed or performed in visit on  01/17/22   EKG 12-Lead     Hospital Course: [name] [name] is a 76 y.o. who was admitted to [facility]. They were brought to the operating room on 04/01/2022 and underwent Procedure(s): TOTAL KNEE ARTHROPLASTY.  Patient tolerated the procedure well and was later transferred to the recovery room and then to the orthopaedic floor for postoperative care. They were given PO and IV analgesics for pain control following their surgery. They were given 24 hours of postoperative antibiotics of  Anti-infectives (From admission, onward)    Start     Dose/Rate Route Frequency Ordered Stop   04/01/22 1600  ceFAZolin (ANCEF) IVPB 2g/100 mL premix        2 g 200 mL/hr over 30 Minutes Intravenous Every 6 hours 04/01/22 1405 04/01/22 2222   04/01/22 0815  ceFAZolin (ANCEF) IVPB 2g/100 mL premix        2 g 200 mL/hr over 30 Minutes Intravenous On call to O.R. 04/01/22 4259 04/01/22 1054      and started on DVT prophylaxis in the form of Aspirin.   PT and OT were ordered for total joint protocol. Discharge planning consulted to help with postop disposition and equipment needs.  Patient had a good night on the evening of surgery. They started to get up OOB with therapy on POD #0. Pt was seen during rounds and was ready to go home pending progress with therapy. She worked with therapy on POD #1 and was meeting her goals. Pt was discharged to home later that day in stable condition.  Diet: Regular diet Activity: WBAT Follow-up: in 2 weeks Disposition: Home Discharged Condition: stable   Discharge Instructions     Call MD / Call 911   Complete by: As directed    If you experience chest  pain or shortness of breath, CALL 911 and be transported to the hospital emergency room.  If you develope a fever above 101 F, pus (white drainage) or increased drainage or redness at the wound, or calf pain, call your surgeon's office.   Change dressing   Complete by: As directed    You may remove the bulky bandage  (ACE wrap and gauze) two days after surgery. You will have an adhesive waterproof bandage underneath. Leave this in place until your first follow-up appointment.   Constipation Prevention   Complete by: As directed    Drink plenty of fluids.  Prune juice may be helpful.  You may use a stool softener, such as Colace (over the counter) 100 mg twice a day.  Use MiraLax (over the counter) for constipation as needed.   Diet - low sodium heart healthy   Complete by: As directed    Do not put a pillow under the knee. Place it under the heel.   Complete by: As directed    Driving restrictions   Complete by: As directed    No driving for two weeks   Post-operative opioid taper instructions:   Complete by: As directed    POST-OPERATIVE OPIOID TAPER INSTRUCTIONS: It is important to wean off of your opioid medication as soon as possible. If you do not need pain medication after your surgery it is ok to stop day one. Opioids include: Codeine, Hydrocodone(Norco, Vicodin), Oxycodone(Percocet, oxycontin) and hydromorphone amongst others.  Long term and even short term use of opiods can cause: Increased pain response Dependence Constipation Depression Respiratory depression And more.  Withdrawal symptoms can include Flu like symptoms Nausea, vomiting And more Techniques to manage these symptoms Hydrate well Eat regular healthy meals Stay active Use relaxation techniques(deep breathing, meditating, yoga) Do Not substitute Alcohol to help with tapering If you have been on opioids for less than two weeks and do not have pain than it is ok to stop all together.  Plan to wean off of opioids This plan should start within one week post op of your joint replacement. Maintain the same interval or time between taking each dose and first decrease the dose.  Cut the total daily intake of opioids by one tablet each day Next start to increase the time between doses. The last dose that should be eliminated  is the evening dose.      TED hose   Complete by: As directed    Use stockings (TED hose) for three weeks on both leg(s).  You may remove them at night for sleeping.   Weight bearing as tolerated   Complete by: As directed       Allergies as of 04/02/2022       Reactions   Tape    Red, swollen, itching        Medication List     STOP taking these medications    Advil PM 200-38 MG Tabs Generic drug: Ibuprofen-diphenhydrAMINE Cit       TAKE these medications    ALPRAZolam 0.25 MG tablet Commonly known as: XANAX Take 1 tablet (0.25 mg total) by mouth 2 (two) times daily as needed for anxiety.   aspirin EC 325 MG tablet Take 1 tablet (325 mg total) by mouth 2 (two) times daily for 20 days. Then take one 81 mg aspirin once a day for three weeks. Then discontinue aspirin   Biotin 5000 MCG Tabs Take 5,000 mcg by mouth daily.   Centrum Silver tablet Take  1 tablet by mouth daily.   COLLAGEN 1500/C PO Take 1 Scoop by mouth daily.   cycloSPORINE 0.05 % ophthalmic emulsion Commonly known as: RESTASIS Place 1 drop into both eyes daily.   diphenhydramine-acetaminophen 25-500 MG Tabs tablet Commonly known as: TYLENOL PM Take 1 tablet by mouth at bedtime as needed (pain/sleep).   gabapentin 300 MG capsule Commonly known as: NEURONTIN Take 1 capsule (300 mg total) by mouth as directed. Take a 300 mg capsule three times a day for two weeks following surgery.Then take a 300 mg capsule two times a day for two weeks. Then take a 300 mg capsule once a day for two weeks. Then discontinue.   levothyroxine 125 MCG tablet Commonly known as: SYNTHROID TAKE 1 TABLET BY MOUTH  DAILY BEFORE BREAKFAST   loperamide 2 MG capsule Commonly known as: IMODIUM TAKE 1 CAPSULE BY MOUTH  TWICE DAILY What changed:  when to take this additional instructions   metFORMIN 500 MG tablet Commonly known as: GLUCOPHAGE Take 1 tablet (500 mg total) by mouth daily with breakfast.    methocarbamol 500 MG tablet Commonly known as: ROBAXIN Take 1 tablet (500 mg total) by mouth every 6 (six) hours as needed for muscle spasms.   oxyCODONE 5 MG immediate release tablet Commonly known as: Oxy IR/ROXICODONE Take 1-2 tablets (5-10 mg total) by mouth every 6 (six) hours as needed for severe pain.   sertraline 100 MG tablet Commonly known as: ZOLOFT Take 1 tablet (100 mg total) by mouth daily.   simvastatin 20 MG tablet Commonly known as: ZOCOR TAKE 1 TABLET BY MOUTH AT  BEDTIME (NEED OFFICE VISIT  FOR FURTHER REFILLS)   Systane Complete 0.6 % Soln Generic drug: Propylene Glycol Place 1 drop into both eyes as needed (dry eyes).   Vitamin D (Ergocalciferol) 1.25 MG (50000 UNIT) Caps capsule Commonly known as: DRISDOL TAKE 1 CAPSULE BY MOUTH  EVERY 7 DAYS What changed: when to take this               Discharge Care Instructions  (From admission, onward)           Start     Ordered   04/02/22 0000  Weight bearing as tolerated        04/02/22 0737   04/02/22 0000  Change dressing       Comments: You may remove the bulky bandage (ACE wrap and gauze) two days after surgery. You will have an adhesive waterproof bandage underneath. Leave this in place until your first follow-up appointment.   04/02/22 0737            Follow-up Information     Gaynelle Arabian, MD. Schedule an appointment as soon as possible for a visit in 2 week(s).   Specialty: Orthopedic Surgery Contact information: [address] [place] [address] [phone]                 Signed: R. Jaynie Bream, PA-C Orthopedic Surgery 04/03/2022, 9:02 AM

## 2022-04-04 ENCOUNTER — Ambulatory Visit: Payer: 59 | Attending: Student | Admitting: Physical Therapy

## 2022-04-04 ENCOUNTER — Encounter: Payer: Self-pay | Admitting: Physical Therapy

## 2022-04-04 DIAGNOSIS — M6281 Muscle weakness (generalized): Secondary | ICD-10-CM | POA: Insufficient documentation

## 2022-04-04 DIAGNOSIS — R600 Localized edema: Secondary | ICD-10-CM | POA: Diagnosis present

## 2022-04-04 DIAGNOSIS — R2681 Unsteadiness on feet: Secondary | ICD-10-CM | POA: Insufficient documentation

## 2022-04-04 DIAGNOSIS — M25561 Pain in right knee: Secondary | ICD-10-CM | POA: Diagnosis present

## 2022-04-04 DIAGNOSIS — R262 Difficulty in walking, not elsewhere classified: Secondary | ICD-10-CM | POA: Insufficient documentation

## 2022-04-04 DIAGNOSIS — R278 Other lack of coordination: Secondary | ICD-10-CM | POA: Diagnosis present

## 2022-04-04 NOTE — Therapy (Signed)
OUTPATIENT PHYSICAL THERAPY LOWER EXTREMITY EVALUATION   Patient Name: [name] [name] MRN: [id number] DOB:[birth year], 76 y.o., female Today's Date: 04/04/2022   PT End of Session - 04/04/22 1509     Visit Number 1    Date for PT Re-Evaluation 06/27/22    PT Start Time 1312    PT Stop Time 1356    PT Time Calculation (min) 44 min    Activity Tolerance No increased pain;Patient tolerated treatment well    Behavior During Therapy [facility] for tasks assessed/performed             Past Medical History:  Diagnosis Date   Allergic rhinitis    Anxiety    Arthritis    Cataract    Depression    Diabetes mellitus without complication (HCC)    borderline   Emphysema lung ([place])    Early stage   Endometrial polyp    Hyperlipidemia    Hyperlipidemia    on simvastatin   Hypothyroidism    IBS (irritable bowel syndrome)    Obesity    Osteoarthritis    Seasonal allergies    Sleep apnea    cpap   Past Surgical History:  Procedure Laterality Date   BUNIONECTOMY  2009   Dr.Duda, left   CARPAL TUNNEL RELEASE Right 11/28/2021   Procedure: RIGHT CARPAL TUNNEL RELEASE;  Surgeon: Sherilyn Cooter, MD;  Location: [place];  Service: Orthopedics;  Laterality: Right;   EYE SURGERY     HYSTEROSCOPY WITH D & C  04/24/2011   Procedure: DILATATION AND CURETTAGE (D&C) /HYSTEROSCOPY;  Surgeon: Eldred Manges, MD;  Location: [place] [facility];  Service: Gynecology;  Laterality: N/A;   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   Dr Chrys Racer FASCIA SURGERY  1994   Dr Percell Miller   POLYPECTOMY  04/24/2011   Procedure: POLYPECTOMY;  Surgeon: Eldred Manges, MD;  Location: [place] [facility];  Service: Gynecology;  Laterality: N/A;   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   Procedure: TOTAL KNEE ARTHROPLASTY;  Surgeon: Gaynelle Arabian, MD;  Location: [facility];  Service: Orthopedics;  Laterality: Right;   Patient Active Problem List   Diagnosis Date Noted   Osteoarthritis of right knee 04/01/2022    History of obstructive sleep apnea 02/11/2022   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 serious comorbidity with body mass index (BMI) of 37.0 to 37.9 in adult 02/11/2022   Weight loss observed on examination 02/11/2022   Pre-operative clearance 01/17/2022   Primary osteoarthritis of right knee 01/17/2022   Carpal tunnel syndrome, right upper limb 11/15/2021   Numbness 11/02/2021   OSA (obstructive sleep apnea) 07/24/2020   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/13/2020   Upper airway cough syndrome 06/13/2020   Class 3 severe obesity due to excess calories with serious comorbidity and body mass index (BMI) of 45.0 to 49.9 in adult [facility]) 06/13/2020   MCI (mild cognitive impairment) 06/13/2020   Insulin resistance 05/09/2020   Insomnia 05/09/2020   Vitamin D deficiency 02/17/2019   Diarrhea 02/17/2019   Essential hypertension 02/17/2019   Class 3 severe obesity due to excess calories with serious comorbidity and body mass index (BMI) of 40.0 to 44.9 in adult ([place]) 02/17/2019   Hair loss 02/17/2019   Anxiety 11/30/2017   Primary osteoarthritis of both knees 11/30/2017   Abnormal WBC count 11/30/2017   Obesity (BMI 30-39.9) 07/23/2013   Suspicious mole 02/01/2013   Palpitations 07/23/2012   Hyperlipidemia 01/28/2012   Morbid obesity ([place]) 04/24/2011   Preventative  health care 02/20/2011   Skin lump of leg 10/10/2010   SORE THROAT 08/15/2010   LYMPHADENOPATHY 08/15/2010   TOBACCO USE 01/17/2010   Hyperlipidemia associated with type 2 diabetes mellitus ([place]) 04/05/2008   TINNITUS, CHRONIC, 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   ALLERGIC RHINITIS 01/25/2008   LIVER FUNCTION TESTS, ABNORMAL 01/25/2008   Hypothyroidism 01/13/2007   Depression with anxiety 01/13/2007   TRANSIENT GLOBAL AMNESIA 01/13/2007   FOOT SURGERY, HX OF 01/13/2007    PCP: Roma Schanz  REFERRING PROVIDER: Alluisio  REFERRING DIAG: R  TKR  THERAPY DIAG:  Acute pain of right knee  Difficulty in walking, not elsewhere  classified  Localized edema  Muscle weakness (generalized)  Other lack of coordination  Unsteadiness on feet  Rationale for Evaluation and Treatment Rehabilitation  ONSET DATE: 04/01/22  SUBJECTIVE:   SUBJECTIVE STATEMENT: Patient reports that she has felt better than anticipated since her surgery. She is trying to cut back on the Oxy. Her quads feel very tight and sore.  PERTINENT HISTORY: History of Present Illness Pt is a 76yo female presenting s/p R-TKA on 04/01/22. PMH: DM, Emphysema, HLD, Hypothyroidism, IBS, OSA on CPAP, R-carpal tunnel release 11/28/21, mild cognitive impairment  PAIN:  Are you having pain? Yes: NPRS scale: 8/10 Pain location: R knee Pain description: sharp Aggravating factors: bending Relieving factors: pain medication  PRECAUTIONS: None  WEIGHT BEARING RESTRICTIONS No  FALLS:  Has patient fallen in last 6 months? No  LIVING ENVIRONMENT: Lives with: lives with their spouse Lives in: House/apartment Stairs: Yes: Internal: 14 steps; on right going up and External: 3 steps; none, has grab bar at the top Has following equipment at home: Gilford Rile - 2 wheeled, Environmental consultant - 4 wheeled, Electronics engineer, and Grab bars  OCCUPATION: retired  PLOF: Independent  PATIENT GOALS :Be able to walk with a normal gait, minimal pain.   OBJECTIVE:   PATIENT SURVEYS:  FOTO 26.11  COGNITION:  Overall cognitive status: Within functional limits for tasks assessed     SENSATION: Not tested  EDEMA:  R knee with mod edema  MUSCLE LENGTH: Hamstrings: Right deferred deg; Left 80 deg  POSTURE: rounded shoulders and flexed trunk   PALPATION: TTP around R knee.  LOWER EXTREMITY ROM: LLE ROM WFL, R hip and ankle WFL  Active ROM Right eval Left eval  Hip flexion    Hip extension    Hip abduction    Hip adduction    Hip internal rotation    Hip external rotation    Knee flexion 86   Knee extension 11   Ankle dorsiflexion    Ankle plantarflexion    Ankle  inversion    Ankle eversion     (Blank rows = not tested)  LOWER EXTREMITY MMT:  MMT Right eval Left eval  Hip flexion 3   Hip extension 3   Hip abduction    Hip adduction    Hip internal rotation    Hip external rotation    Knee flexion 3+   Knee extension 3   Ankle dorsiflexion 4-   Ankle plantarflexion    Ankle inversion    Ankle eversion     (Blank rows = not tested)   FUNCTIONAL TESTS:  5 times sit to stand: 18.69 Timed up and go (TUG): 17.72  GAIT: Distance walked: In clinic Assistive device utilized: Walker - 2 wheeled Level of assistance: SBA Comments: Antalgic gait, leans on BUE support    TODAY'S TREATMENT: Education, There ex   PATIENT EDUCATION:  Education details: HEP, POC Person educated: Patient Education method: Explanation, Demonstration, and Handouts Education comprehension: verbalized understanding and returned demonstration   HOME EXERCISE PROGRAM: TKR basic exercises template-AP, HS, SAQ, KP, Abd, SLR, seated long kick, knee flexion  ASSESSMENT:  CLINICAL IMPRESSION: Patient is a 76 y.o. who was seen today for physical therapy evaluation and treatment for Patient presents after R TKR. She demonstrates weakness, decreased ROM, pain, decreased WB and functional use of the RLE. She will benefit from PT to address her deficits in order to return to her PLOF.   OBJECTIVE IMPAIRMENTS Abnormal gait, decreased activity tolerance, decreased balance, decreased coordination, decreased endurance, decreased mobility, difficulty walking, decreased ROM, decreased strength, hypomobility, increased edema, increased muscle spasms, impaired flexibility, and pain.   ACTIVITY LIMITATIONS bending, standing, squatting, stairs, transfers, reach over head, and locomotion level  PARTICIPATION LIMITATIONS: meal prep, cleaning, laundry, driving, shopping, and yard work  PERSONAL FACTORS Age and Past/current experiences are also affecting patient's functional  outcome.   REHAB POTENTIAL: Good  CLINICAL DECISION MAKING: Evolving/moderate complexity  EVALUATION COMPLEXITY: Moderate   GOALS: Goals reviewed with patient? Yes  SHORT TERM GOALS: Target date: 05/02/2022  I with basic HEP Baseline: Goal status: INITIAL  2.  Increase R knee ROM to at least 7-95 Baseline: 11-86 Goal status: INITIAL  3.  Increase R knee strength to at least 3+/5 Baseline: 3 Goal status: INITIAL  LONG TERM GOALS: Target date: 06/27/2022   I with final HEP Baseline:  Goal status: INITIAL  2.  Increase R knee ROM to at least 5-120 Baseline: 11-86 Goal status: INITIAL  3.  Patient will climb up and down at least 14 steps with R rail going up, MI, step over step going up. Baseline:  Goal status: INITIAL  4.  Increase r knee strength to at least 4/5 Baseline: 3 Goal status: INITIAL  5.  Patient will ambulate at least 400' with LRAD on level and unlevel surfaces, MI Baseline: limited distance, RW Goal status: INITIAL  6.  Increase FOTO score to at least 49 Baseline: 26 Goal status: INITIAL  7.  Complete TUG in < 12 sec with LRAD Baseline: 17.72 Goals status: INITIAL   PLAN: PT FREQUENCY: 3x/week  PT DURATION: 12 weeks  PLANNED INTERVENTIONS: Therapeutic exercises, Therapeutic activity, Neuromuscular re-education, Balance training, Gait training, Patient/Family education, Self Care, Joint mobilization, Stair training, Dry Needling, Cryotherapy, Moist heat, Vasopneumatic device, Ionotophoresis '4mg'$ /ml Dexamethasone, and Manual therapy  PLAN FOR NEXT SESSION: ROM, strength, functional mobility re-education, pain management.   Marcelina Morel, DPT 04/04/2022, 3:12 PM

## 2022-04-08 ENCOUNTER — Encounter: Payer: Self-pay | Admitting: Physical Therapy

## 2022-04-08 ENCOUNTER — Ambulatory Visit: Payer: 59 | Admitting: Physical Therapy

## 2022-04-08 DIAGNOSIS — R278 Other lack of coordination: Secondary | ICD-10-CM

## 2022-04-08 DIAGNOSIS — R262 Difficulty in walking, not elsewhere classified: Secondary | ICD-10-CM

## 2022-04-08 DIAGNOSIS — R2681 Unsteadiness on feet: Secondary | ICD-10-CM

## 2022-04-08 DIAGNOSIS — M25561 Pain in right knee: Secondary | ICD-10-CM | POA: Diagnosis not present

## 2022-04-08 DIAGNOSIS — M6281 Muscle weakness (generalized): Secondary | ICD-10-CM

## 2022-04-08 DIAGNOSIS — R600 Localized edema: Secondary | ICD-10-CM

## 2022-04-08 NOTE — Therapy (Signed)
OUTPATIENT PHYSICAL THERAPY LOWER EXTREMITY EVALUATION   Patient Name: [name] [name] MRN: [id number] DOB:[birth year], 76 y.o., female Today's Date: 04/08/2022   PT End of Session - 04/08/22 1333     Visit Number 2    Date for PT Re-Evaluation 06/27/22    PT Start Time 8502    PT Stop Time 1358    PT Time Calculation (min) 41 min    Activity Tolerance No increased pain;Patient tolerated treatment well    Behavior During Therapy [facility] for tasks assessed/performed              Past Medical History:  Diagnosis Date   Allergic rhinitis    Anxiety    Arthritis    Cataract    Depression    Diabetes mellitus without complication (HCC)    borderline   Emphysema lung ([place])    Early stage   Endometrial polyp    Hyperlipidemia    Hyperlipidemia    on simvastatin   Hypothyroidism    IBS (irritable bowel syndrome)    Obesity    Osteoarthritis    Seasonal allergies    Sleep apnea    cpap   Past Surgical History:  Procedure Laterality Date   BUNIONECTOMY  2009   Dr.Duda, left   CARPAL TUNNEL RELEASE Right 11/28/2021   Procedure: RIGHT CARPAL TUNNEL RELEASE;  Surgeon: Sherilyn Cooter, MD;  Location: [place];  Service: Orthopedics;  Laterality: Right;   EYE SURGERY     HYSTEROSCOPY WITH D & C  04/24/2011   Procedure: DILATATION AND CURETTAGE (D&C) /HYSTEROSCOPY;  Surgeon: Eldred Manges, MD;  Location: [place] [facility];  Service: Gynecology;  Laterality: N/A;   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   Dr Chrys Racer FASCIA SURGERY  1994   Dr Percell Miller   POLYPECTOMY  04/24/2011   Procedure: POLYPECTOMY;  Surgeon: Eldred Manges, MD;  Location: [place] [facility];  Service: Gynecology;  Laterality: N/A;   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   Procedure: TOTAL KNEE ARTHROPLASTY;  Surgeon: Gaynelle Arabian, MD;  Location[facility];  Service: Orthopedics;  Laterality: Right;   Patient Active Problem List   Diagnosis Date Noted   Osteoarthritis of right knee 04/01/2022    History of obstructive sleep apnea 02/11/2022   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 serious comorbidity with body mass index (BMI) of 37.0 to 37.9 in adult 02/11/2022   Weight loss observed on examination 02/11/2022   Pre-operative clearance 01/17/2022   Primary osteoarthritis of right knee 01/17/2022   Carpal tunnel syndrome, right upper limb 11/15/2021   Numbness 11/02/2021   OSA (obstructive sleep apnea) 07/24/2020   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/13/2020   Upper airway cough syndrome 06/13/2020   Class 3 severe obesity due to excess calories with serious comorbidity and body mass index (BMI) of 45.0 to 49.9 in adult [facility]) 06/13/2020   MCI (mild cognitive impairment) 06/13/2020   Insulin resistance 05/09/2020   Insomnia 05/09/2020   Vitamin D deficiency 02/17/2019   Diarrhea 02/17/2019   Essential hypertension 02/17/2019   Class 3 severe obesity due to excess calories with serious comorbidity and body mass index (BMI) of 40.0 to 44.9 in adult ([place]) 02/17/2019   Hair loss 02/17/2019   Anxiety 11/30/2017   Primary osteoarthritis of both knees 11/30/2017   Abnormal WBC count 11/30/2017   Obesity (BMI 30-39.9) 07/23/2013   Suspicious mole 02/01/2013   Palpitations 07/23/2012   Hyperlipidemia 01/28/2012   Morbid obesity ([place]) 04/24/2011  Preventative health care 02/20/2011   Skin lump of leg 10/10/2010   SORE THROAT 08/15/2010   LYMPHADENOPATHY 08/15/2010   TOBACCO USE 01/17/2010   Hyperlipidemia associated with type 2 diabetes mellitus ([place]) 04/05/2008   TINNITUS, CHRONIC, 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   ALLERGIC RHINITIS 01/25/2008   LIVER FUNCTION TESTS, ABNORMAL 01/25/2008   Hypothyroidism 01/13/2007   Depression with anxiety 01/13/2007   TRANSIENT GLOBAL AMNESIA 01/13/2007   FOOT SURGERY, HX OF 01/13/2007    PCP: Roma Schanz  REFERRING PROVIDER: Alluisio  REFERRING DIAG: R  TKR  THERAPY DIAG:  Acute pain of right knee  Difficulty in walking, not elsewhere  classified  Localized edema  Muscle weakness (generalized)  Other lack of coordination  Unsteadiness on feet  Rationale for Evaluation and Treatment Rehabilitation  ONSET DATE: 04/01/22  SUBJECTIVE:   SUBJECTIVE STATEMENT: Patient reports that her knee pain increased dramatically for about 2 days, but it has returned to baseline in the past day or so. She is performing her HEP twice a day.  PERTINENT HISTORY: History of Present Illness Pt is a 76yo female presenting s/p R-TKA on 04/01/22. PMH: DM, Emphysema, HLD, Hypothyroidism, IBS, OSA on CPAP, R-carpal tunnel release 11/28/21, mild cognitive impairment  PAIN:  Are you having pain? Yes: NPRS scale: 8/10 Pain location: R knee Pain description: sharp Aggravating factors: bending Relieving factors: pain medication  PRECAUTIONS: None  WEIGHT BEARING RESTRICTIONS No  FALLS:  Has patient fallen in last 6 months? No  LIVING ENVIRONMENT: Lives with: lives with their spouse Lives in: House/apartment Stairs: Yes: Internal: 14 steps; on right going up and External: 3 steps; none, has grab bar at the top Has following equipment at home: Gilford Rile - 2 wheeled, Environmental consultant - 4 wheeled, Electronics engineer, and Grab bars  OCCUPATION: retired  PLOF: Independent  PATIENT GOALS :Be able to walk with a normal gait, minimal pain.   OBJECTIVE:   PATIENT SURVEYS:  FOTO 26.11  COGNITION:  Overall cognitive status: Within functional limits for tasks assessed     SENSATION: Not tested  EDEMA:  R knee with mod edema  MUSCLE LENGTH: Hamstrings: Right deferred deg; Left 80 deg  POSTURE: rounded shoulders and flexed trunk   PALPATION: TTP around R knee.  LOWER EXTREMITY ROM: LLE ROM WFL, R hip and ankle WFL  Active ROM Right eval Left eval  Hip flexion    Hip extension    Hip abduction    Hip adduction    Hip internal rotation    Hip external rotation    Knee flexion 86   Knee extension 11   Ankle dorsiflexion    Ankle  plantarflexion    Ankle inversion    Ankle eversion     (Blank rows = not tested)  LOWER EXTREMITY MMT:  MMT Right eval Left eval  Hip flexion 3   Hip extension 3   Hip abduction    Hip adduction    Hip internal rotation    Hip external rotation    Knee flexion 3+   Knee extension 3   Ankle dorsiflexion 4-   Ankle plantarflexion    Ankle inversion    Ankle eversion     (Blank rows = not tested)   FUNCTIONAL TESTS:  5 times sit to stand: 18.69 Timed up and go (TUG): 17.72  GAIT: Distance walked: In clinic Assistive device utilized: Walker - 2 wheeled Level of assistance: SBA Comments: Antalgic gait, leans on BUE support    TODAY'S TREATMENT: NuStep L4 x  6 minutes Supine knee exercises-KP with heel elevated, heel slides, SAQ, SLR 10 reps each Seated long kicks and knee flexion, 10 each L knee active flexion to 96 degrees. Step ups onto 2" step with BUE support for balance only 10 reps Rle VASO to R knee 34, med, x 10 minutes.  Education, There ex   PATIENT EDUCATION:  Education details: HEP, POC Person educated: Patient Education method: Consulting civil engineer, Demonstration, and Handouts Education comprehension: verbalized understanding and returned demonstration   HOME EXERCISE PROGRAM: TKR basic exercises template-AP, HS, SAQ, KP, Abd, SLR, seated long kick, knee flexion  ASSESSMENT:  CLINICAL IMPRESSION: Patient toleratd progression of strengthening without difficulty. Finished with VASO for pain control as her pain exacerbated after last treatment.   OBJECTIVE IMPAIRMENTS Abnormal gait, decreased activity tolerance, decreased balance, decreased coordination, decreased endurance, decreased mobility, difficulty walking, decreased ROM, decreased strength, hypomobility, increased edema, increased muscle spasms, impaired flexibility, and pain.   ACTIVITY LIMITATIONS bending, standing, squatting, stairs, transfers, reach over head, and locomotion  level  PARTICIPATION LIMITATIONS: meal prep, cleaning, laundry, driving, shopping, and yard work  PERSONAL FACTORS Age and Past/current experiences are also affecting patient's functional outcome.   REHAB POTENTIAL: Good  CLINICAL DECISION MAKING: Evolving/moderate complexity  EVALUATION COMPLEXITY: Moderate   GOALS: Goals reviewed with patient? Yes  SHORT TERM GOALS: Target date: 05/02/2022  I with basic HEP Baseline: Goal status: ongoing  2.  Increase R knee ROM to at least 7-95 Baseline: 11-86 Goal status: INITIAL  3.  Increase R knee strength to at least 3+/5 Baseline: 3 Goal status: INITIAL  LONG TERM GOALS: Target date: 06/27/2022   I with final HEP Baseline:  Goal status: INITIAL  2.  Increase R knee ROM to at least 5-120 Baseline: 11-86 Goal status: INITIAL  3.  Patient will climb up and down at least 14 steps with R rail going up, MI, step over step going up. Baseline:  Goal status: INITIAL  4.  Increase r knee strength to at least 4/5 Baseline: 3 Goal status: INITIAL  5.  Patient will ambulate at least 400' with LRAD on level and unlevel surfaces, MI Baseline: limited distance, RW Goal status: INITIAL  6.  Increase FOTO score to at least 49 Baseline: 26 Goal status: INITIAL  7.  Complete TUG in < 12 sec with LRAD Baseline: 17.72 Goals status: INITIAL   PLAN: PT FREQUENCY: 3x/week  PT DURATION: 12 weeks  PLANNED INTERVENTIONS: Therapeutic exercises, Therapeutic activity, Neuromuscular re-education, Balance training, Gait training, Patient/Family education, Self Care, Joint mobilization, Stair training, Dry Needling, Cryotherapy, Moist heat, Vasopneumatic device, Ionotophoresis '4mg'$ /ml Dexamethasone, and Manual therapy  PLAN FOR NEXT SESSION: ROM, strength, functional mobility re-education, pain management.   Marcelina Morel, DPT 04/08/2022, 1:53 PM

## 2022-04-10 ENCOUNTER — Encounter: Payer: Self-pay | Admitting: Physical Therapy

## 2022-04-10 ENCOUNTER — Ambulatory Visit: Payer: 59 | Admitting: Physical Therapy

## 2022-04-10 DIAGNOSIS — M25561 Pain in right knee: Secondary | ICD-10-CM | POA: Diagnosis not present

## 2022-04-10 DIAGNOSIS — R2681 Unsteadiness on feet: Secondary | ICD-10-CM

## 2022-04-10 DIAGNOSIS — R600 Localized edema: Secondary | ICD-10-CM

## 2022-04-10 DIAGNOSIS — M6281 Muscle weakness (generalized): Secondary | ICD-10-CM

## 2022-04-10 DIAGNOSIS — R262 Difficulty in walking, not elsewhere classified: Secondary | ICD-10-CM

## 2022-04-10 DIAGNOSIS — R278 Other lack of coordination: Secondary | ICD-10-CM

## 2022-04-10 NOTE — Therapy (Signed)
OUTPATIENT PHYSICAL THERAPY LOWER EXTREMITY EVALUATION   Patient Name: [name] [name] MRN: [id number] DOB:08-01-1945, 76 y.o., female Today's Date: 04/10/2022   PT End of Session - 04/10/22 1324     Visit Number 3    Date for PT Re-Evaluation 06/27/22    PT Start Time 1316    PT Stop Time 1356    PT Time Calculation (min) 40 min    Activity Tolerance No increased pain;Patient tolerated treatment well    Behavior During Therapy [facility] for tasks assessed/performed               Past Medical History:  Diagnosis Date   Allergic rhinitis    Anxiety    Arthritis    Cataract    Depression    Diabetes mellitus without complication (HCC)    borderline   Emphysema lung ([place])    Early stage   Endometrial polyp    Hyperlipidemia    Hyperlipidemia    on simvastatin   Hypothyroidism    IBS (irritable bowel syndrome)    Obesity    Osteoarthritis    Seasonal allergies    Sleep apnea    cpap   Past Surgical History:  Procedure Laterality Date   BUNIONECTOMY  2009   Dr.Duda, left   CARPAL TUNNEL RELEASE Right 11/28/2021   Procedure: RIGHT CARPAL TUNNEL RELEASE;  Surgeon: Sherilyn Cooter, MD;  Location: [place];  Service: Orthopedics;  Laterality: Right;   EYE SURGERY     HYSTEROSCOPY WITH D & C  04/24/2011   Procedure: DILATATION AND CURETTAGE (D&C) /HYSTEROSCOPY;  Surgeon: Eldred Manges, MD;  Location: [facility];  Service: Gynecology;  Laterality: N/A;   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   Dr Chrys Racer FASCIA SURGERY  1994   Dr Percell Miller   POLYPECTOMY  04/24/2011   Procedure: POLYPECTOMY;  Surgeon: Eldred Manges, MD;  Location: [facility];  Service: Gynecology;  Laterality: N/A;   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   Procedure: TOTAL KNEE ARTHROPLASTY;  Surgeon: Gaynelle Arabian, MD;  Location: [facility];  Service: Orthopedics;  Laterality: Right;   Patient Active Problem List   Diagnosis Date Noted   Osteoarthritis of right knee 04/01/2022    History of obstructive sleep apnea 02/11/2022   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 serious comorbidity with body mass index (BMI) of 37.0 to 37.9 in adult 02/11/2022   Weight loss observed on examination 02/11/2022   Pre-operative clearance 01/17/2022   Primary osteoarthritis of right knee 01/17/2022   Carpal tunnel syndrome, right upper limb 11/15/2021   Numbness 11/02/2021   OSA (obstructive sleep apnea) 07/24/2020   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/13/2020   Upper airway cough syndrome 06/13/2020   Class 3 severe obesity due to excess calories with serious comorbidity and body mass index (BMI) of 45.0 to 49.9 in adult [facility]) 06/13/2020   MCI (mild cognitive impairment) 06/13/2020   Insulin resistance 05/09/2020   Insomnia 05/09/2020   Vitamin D deficiency 02/17/2019   Diarrhea 02/17/2019   Essential hypertension 02/17/2019   Class 3 severe obesity due to excess calories with serious comorbidity and body mass index (BMI) of 40.0 to 44.9 in adult ([place]) 02/17/2019   Hair loss 02/17/2019   Anxiety 11/30/2017   Primary osteoarthritis of both knees 11/30/2017   Abnormal WBC count 11/30/2017   Obesity (BMI 30-39.9) 07/23/2013   Suspicious mole 02/01/2013   Palpitations 07/23/2012   Hyperlipidemia 01/28/2012   Morbid obesity ([place]) 04/24/2011  Preventative health care 02/20/2011   Skin lump of leg 10/10/2010   SORE THROAT 08/15/2010   LYMPHADENOPATHY 08/15/2010   TOBACCO USE 01/17/2010   Hyperlipidemia associated with type 2 diabetes mellitus ([place]) 04/05/2008   TINNITUS, CHRONIC, 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   ALLERGIC RHINITIS 01/25/2008   LIVER FUNCTION TESTS, ABNORMAL 01/25/2008   Hypothyroidism 01/13/2007   Depression with anxiety 01/13/2007   TRANSIENT GLOBAL AMNESIA 01/13/2007   FOOT SURGERY, HX OF 01/13/2007    PCP: Roma Schanz  REFERRING PROVIDER: Alluisio  REFERRING DIAG: R  TKR  THERAPY DIAG:  Acute pain of right knee  Difficulty in walking, not elsewhere  classified  Localized edema  Muscle weakness (generalized)  Other lack of coordination  Unsteadiness on feet  Rationale for Evaluation and Treatment Rehabilitation  ONSET DATE: 04/01/22  SUBJECTIVE:   SUBJECTIVE STATEMENT: Patient likes the VASO, reports much less pain after last treatment. She has been sitting with knee elevated and extended "99%" of the time. Encouraged to allow knee flexion every 30 minutes or so and keep practicing knee flexion.  PERTINENT HISTORY: History of Present Illness Pt is a 76yo female presenting s/p R-TKA on 04/01/22. PMH: DM, Emphysema, HLD, Hypothyroidism, IBS, OSA on CPAP, R-carpal tunnel release 11/28/21, mild cognitive impairment  PAIN:  Are you having pain? Yes: NPRS scale: 8/10 Pain location: R knee Pain description: sharp Aggravating factors: bending Relieving factors: pain medication  PRECAUTIONS: None  WEIGHT BEARING RESTRICTIONS No  FALLS:  Has patient fallen in last 6 months? No  LIVING ENVIRONMENT: Lives with: lives with their spouse Lives in: House/apartment Stairs: Yes: Internal: 14 steps; on right going up and External: 3 steps; none, has grab bar at the top Has following equipment at home: Gilford Rile - 2 wheeled, Environmental consultant - 4 wheeled, Electronics engineer, and Grab bars  OCCUPATION: retired  PLOF: Independent  PATIENT GOALS :Be able to walk with a normal gait, minimal pain.   OBJECTIVE:   PATIENT SURVEYS:  FOTO 26.11  COGNITION:  Overall cognitive status: Within functional limits for tasks assessed     SENSATION: Not tested  EDEMA:  R knee with mod edema  MUSCLE LENGTH: Hamstrings: Right deferred deg; Left 80 deg  POSTURE: rounded shoulders and flexed trunk   PALPATION: TTP around R knee.  LOWER EXTREMITY ROM: LLE ROM WFL, R hip and ankle WFL  Active ROM Right eval Left eval  Hip flexion    Hip extension    Hip abduction    Hip adduction    Hip internal rotation    Hip external rotation    Knee flexion  86   Knee extension 11   Ankle dorsiflexion    Ankle plantarflexion    Ankle inversion    Ankle eversion     (Blank rows = not tested)  LOWER EXTREMITY MMT:  MMT Right eval Left eval  Hip flexion 3   Hip extension 3   Hip abduction    Hip adduction    Hip internal rotation    Hip external rotation    Knee flexion 3+   Knee extension 3   Ankle dorsiflexion 4-   Ankle plantarflexion    Ankle inversion    Ankle eversion     (Blank rows = not tested)   FUNCTIONAL TESTS:  5 times sit to stand: 18.69 Timed up and go (TUG): 17.72  GAIT: Distance walked: In clinic Assistive device utilized: Walker - 2 wheeled Level of assistance: SBA Comments: Antalgic gait, leans on BUE support  TODAY'S TREATMENT: 04/10/22 NuStep L4 x 6 minutes   Supine knee press with foot elevated to stretch into extension 5 x 5, overpressure from therapist HS x 10 reps SAQ with 2# resistance, 10 reps SLR x 10, repeat with hip in ER x 10 Seated Act and AA knee flexion ROM-AROM 107 Step ups onto 4" step with SPC and CGA 10 reps. Stair climbing up and down 5 steps using RLE as main leg, with rail and CGA. VASO x 10 min, med pressure, 34 degrees to R knee  04/08/22 NuStep L4 x 6 minutes Supine knee exercises-KP with heel elevated, heel slides, SAQ, SLR 10 reps each Seated long kicks and knee flexion, 10 each L knee active flexion to 96 degrees. Step ups onto 2" step with BUE support for balance only 10 reps Rle VASO to R knee 34, med, x 10 minutes.  Education, There ex   PATIENT EDUCATION:  Education details: HEP, POC Person educated: Patient Education method: Consulting civil engineer, Demonstration, and Handouts Education comprehension: verbalized understanding and returned demonstration   HOME EXERCISE PROGRAM: TKR basic exercises template-AP, HS, SAQ, KP, Abd, SLR, seated long kick, knee flexion  ASSESSMENT:  CLINICAL IMPRESSION: Patient reported good pain relief with VASO. She continues to  progress with ROM and strength in R knee, performed stair training as she can no longer count on her LLE to be her "Good" leg.   OBJECTIVE IMPAIRMENTS Abnormal gait, decreased activity tolerance, decreased balance, decreased coordination, decreased endurance, decreased mobility, difficulty walking, decreased ROM, decreased strength, hypomobility, increased edema, increased muscle spasms, impaired flexibility, and pain.   ACTIVITY LIMITATIONS bending, standing, squatting, stairs, transfers, reach over head, and locomotion level  PARTICIPATION LIMITATIONS: meal prep, cleaning, laundry, driving, shopping, and yard work  PERSONAL FACTORS Age and Past/current experiences are also affecting patient's functional outcome.   REHAB POTENTIAL: Good  CLINICAL DECISION MAKING: Evolving/moderate complexity  EVALUATION COMPLEXITY: Moderate   GOALS: Goals reviewed with patient? Yes  SHORT TERM GOALS: Target date: 05/02/2022  I with basic HEP Baseline: Goal status: ongoing  2.  Increase R knee ROM to at least 7-95 Baseline: 11-86 Goal status: ongoing  3.  Increase R knee strength to at least 3+/5 Baseline: 3 Goal status: ongoing  LONG TERM GOALS: Target date: 06/27/2022   I with final HEP Baseline:  Goal status: INITIAL  2.  Increase R knee ROM to at least 5-120 Baseline: 11-86 Goal status: INITIAL  3.  Patient will climb up and down at least 14 steps with R rail going up, MI, step over step going up. Baseline:  Goal status: INITIAL  4.  Increase r knee strength to at least 4/5 Baseline: 3 Goal status: INITIAL  5.  Patient will ambulate at least 400' with LRAD on level and unlevel surfaces, MI Baseline: limited distance, RW Goal status: INITIAL  6.  Increase FOTO score to at least 49 Baseline: 26 Goal status: INITIAL  7.  Complete TUG in < 12 sec with LRAD Baseline: 17.72 Goals status: INITIAL   PLAN: PT FREQUENCY: 3x/week  PT DURATION: 12 weeks  PLANNED  INTERVENTIONS: Therapeutic exercises, Therapeutic activity, Neuromuscular re-education, Balance training, Gait training, Patient/Family education, Self Care, Joint mobilization, Stair training, Dry Needling, Cryotherapy, Moist heat, Vasopneumatic device, Ionotophoresis '4mg'$ /ml Dexamethasone, and Manual therapy  PLAN FOR NEXT SESSION: ROM, strength, functional mobility re-education, pain management.   Marcelina Morel, DPT 04/10/2022, 1:58 PM

## 2022-04-12 ENCOUNTER — Encounter: Payer: Self-pay | Admitting: Physical Therapy

## 2022-04-12 ENCOUNTER — Ambulatory Visit: Payer: 59 | Admitting: Physical Therapy

## 2022-04-12 DIAGNOSIS — R600 Localized edema: Secondary | ICD-10-CM

## 2022-04-12 DIAGNOSIS — M25561 Pain in right knee: Secondary | ICD-10-CM

## 2022-04-12 DIAGNOSIS — R2681 Unsteadiness on feet: Secondary | ICD-10-CM

## 2022-04-12 DIAGNOSIS — M6281 Muscle weakness (generalized): Secondary | ICD-10-CM

## 2022-04-12 DIAGNOSIS — R262 Difficulty in walking, not elsewhere classified: Secondary | ICD-10-CM

## 2022-04-12 DIAGNOSIS — R278 Other lack of coordination: Secondary | ICD-10-CM

## 2022-04-12 NOTE — Therapy (Signed)
OUTPATIENT PHYSICAL THERAPY LOWER EXTREMITY TREATMENT   Patient Name: [name] [name] MRN: [id number] DOB:11-28-1945, 76 y.o., female Today's Date: 04/12/2022   PT End of Session - 04/12/22 1141     Visit Number 4    Date for PT Re-Evaluation 06/27/22    PT Start Time 1103    PT Stop Time 1142    PT Time Calculation (min) 39 min    Activity Tolerance No increased pain;Patient tolerated treatment well    Behavior During Therapy [facility] for tasks assessed/performed                Past Medical History:  Diagnosis Date   Allergic rhinitis    Anxiety    Arthritis    Cataract    Depression    Diabetes mellitus without complication (HCC)    borderline   Emphysema lung ([place])    Early stage   Endometrial polyp    Hyperlipidemia    Hyperlipidemia    on simvastatin   Hypothyroidism    IBS (irritable bowel syndrome)    Obesity    Osteoarthritis    Seasonal allergies    Sleep apnea    cpap   Past Surgical History:  Procedure Laterality Date   BUNIONECTOMY  2009   Dr.Duda, left   CARPAL TUNNEL RELEASE Right 11/28/2021   Procedure: RIGHT CARPAL TUNNEL RELEASE;  Surgeon: Sherilyn Cooter, MD;  Location: [place];  Service: Orthopedics;  Laterality: Right;   EYE SURGERY     HYSTEROSCOPY WITH D & C  04/24/2011   Procedure: DILATATION AND CURETTAGE (D&C) /HYSTEROSCOPY;  Surgeon: Eldred Manges, MD;  Location: [facility];  Service: Gynecology;  Laterality: N/A;   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   Dr Chrys Racer FASCIA SURGERY  1994   Dr Percell Miller   POLYPECTOMY  04/24/2011   Procedure: POLYPECTOMY;  Surgeon: Eldred Manges, MD;  Location: [facility];  Service: Gynecology;  Laterality: N/A;   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   Procedure: TOTAL KNEE ARTHROPLASTY;  Surgeon: Gaynelle Arabian, MD;  Location[facility];  Service: Orthopedics;  Laterality: Right;   Patient Active Problem List   Diagnosis Date Noted   Osteoarthritis of right knee 04/01/2022    History of obstructive sleep apnea 02/11/2022   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 serious comorbidity with body mass index (BMI) of 37.0 to 37.9 in adult 02/11/2022   Weight loss observed on examination 02/11/2022   Pre-operative clearance 01/17/2022   Primary osteoarthritis of right knee 01/17/2022   Carpal tunnel syndrome, right upper limb 11/15/2021   Numbness 11/02/2021   OSA (obstructive sleep apnea) 07/24/2020   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/13/2020   Upper airway cough syndrome 06/13/2020   Class 3 severe obesity due to excess calories with serious comorbidity and body mass index (BMI) of 45.0 to 49.9 in adult [facility]) 06/13/2020   MCI (mild cognitive impairment) 06/13/2020   Insulin resistance 05/09/2020   Insomnia 05/09/2020   Vitamin D deficiency 02/17/2019   Diarrhea 02/17/2019   Essential hypertension 02/17/2019   Class 3 severe obesity due to excess calories with serious comorbidity and body mass index (BMI) of 40.0 to 44.9 in adult ([place]) 02/17/2019   Hair loss 02/17/2019   Anxiety 11/30/2017   Primary osteoarthritis of both knees 11/30/2017   Abnormal WBC count 11/30/2017   Obesity (BMI 30-39.9) 07/23/2013   Suspicious mole 02/01/2013   Palpitations 07/23/2012   Hyperlipidemia 01/28/2012   Morbid obesity ([place]) 04/24/2011  Preventative health care 02/20/2011   Skin lump of leg 10/10/2010   SORE THROAT 08/15/2010   LYMPHADENOPATHY 08/15/2010   TOBACCO USE 01/17/2010   Hyperlipidemia associated with type 2 diabetes mellitus ([place]) 04/05/2008   TINNITUS, CHRONIC, 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   ALLERGIC RHINITIS 01/25/2008   LIVER FUNCTION TESTS, ABNORMAL 01/25/2008   Hypothyroidism 01/13/2007   Depression with anxiety 01/13/2007   TRANSIENT GLOBAL AMNESIA 01/13/2007   FOOT SURGERY, HX OF 01/13/2007    PCP: Roma Schanz  REFERRING PROVIDER: Alluisio  REFERRING DIAG: R  TKR  THERAPY DIAG:  Acute pain of right knee  Difficulty in walking, not elsewhere  classified  Localized edema  Muscle weakness (generalized)  Other lack of coordination  Unsteadiness on feet  Rationale for Evaluation and Treatment Rehabilitation  ONSET DATE: 04/01/22  SUBJECTIVE:   SUBJECTIVE STATEMENT:  I'm feeling better today, doing well. I think I've crossed the threshold of the unbearable, but still having issues sleeping at night. Still having trouble putting weight on my surgical leg with transfers.  PERTINENT HISTORY: History of Present Illness Pt is a 76yo female presenting s/p R-TKA on 04/01/22. PMH: DM, Emphysema, HLD, Hypothyroidism, IBS, OSA on CPAP, R-carpal tunnel release 11/28/21, mild cognitive impairment  PAIN:  Are you having pain? Yes: NPRS scale: 1-2/10 Pain location: R knee Pain description: "hurting and annoying"  Aggravating factors: bending and getting up from a chair and toilet Relieving factors: pain medication  PRECAUTIONS: None  WEIGHT BEARING RESTRICTIONS No  FALLS:  Has patient fallen in last 6 months? No  LIVING ENVIRONMENT: Lives with: lives with their spouse Lives in: House/apartment Stairs: Yes: Internal: 14 steps; on right going up and External: 3 steps; none, has grab bar at the top Has following equipment at home: Gilford Rile - 2 wheeled, Environmental consultant - 4 wheeled, Electronics engineer, and Grab bars  OCCUPATION: retired  PLOF: Independent  PATIENT GOALS :Be able to walk with a normal gait, minimal pain.   OBJECTIVE:   PATIENT SURVEYS:  FOTO 26.11  COGNITION:  Overall cognitive status: Within functional limits for tasks assessed     SENSATION: Not tested  EDEMA:  R knee with mod edema  MUSCLE LENGTH: Hamstrings: Right deferred deg; Left 80 deg  POSTURE: rounded shoulders and flexed trunk   PALPATION: TTP around R knee.  LOWER EXTREMITY ROM: LLE ROM WFL, R hip and ankle [facility]  Active ROM Right eval Left eval 9/29  Hip flexion     Hip extension     Hip abduction     Hip adduction     Hip internal rotation      Hip external rotation     Knee flexion 86  100 PROM/98 AROM  Knee extension 11  8 AROM  Ankle dorsiflexion     Ankle plantarflexion     Ankle inversion     Ankle eversion      (Blank rows = not tested)  LOWER EXTREMITY MMT:  MMT Right eval Left eval  Hip flexion 3   Hip extension 3   Hip abduction    Hip adduction    Hip internal rotation    Hip external rotation    Knee flexion 3+   Knee extension 3   Ankle dorsiflexion 4-   Ankle plantarflexion    Ankle inversion    Ankle eversion     (Blank rows = not tested)   FUNCTIONAL TESTS:  5 times sit to stand: 18.69 Timed up and go (TUG): 17.72  GAIT: Distance  walked: In clinic Assistive device utilized: Walker - 2 wheeled Level of assistance: SBA Comments: Antalgic gait, leans on BUE support    TODAY'S TREATMENT:  04/12/22  Manual:  Retrograde massage with LE elevated  R quad ball massage with LE elevated Overpressure into knee extension x10  Overpressure into knee flexion x10   TherEx:  Attempted quad sets, significant compensation with glutes even with heavy cues SAQs 0# x15 with 3 second holds in extension  LAQs 0# x15 with 5 second holds in full extension   Lots of education about general recovery from TKR surgery/reasonable expectations for recovery, healing process and reasonable time frames, edema and mm spasm control techniques at home     04/10/22 NuStep L4 x 6 minutes   Supine knee press with foot elevated to stretch into extension 5 x 5, overpressure from therapist HS x 10 reps SAQ with 2# resistance, 10 reps SLR x 10, repeat with hip in ER x 10 Seated Act and AA knee flexion ROM-AROM 107 Step ups onto 4" step with SPC and CGA 10 reps. Stair climbing up and down 5 steps using RLE as main leg, with rail and CGA. VASO x 10 min, med pressure, 34 degrees to R knee  04/08/22 NuStep L4 x 6 minutes Supine knee exercises-KP with heel elevated, heel slides, SAQ, SLR 10 reps each Seated long  kicks and knee flexion, 10 each L knee active flexion to 96 degrees. Step ups onto 2" step with BUE support for balance only 10 reps Rle VASO to R knee 34, med, x 10 minutes.  Education, There ex   PATIENT EDUCATION:  Education details: HEP, POC Person educated: Patient Education method: Consulting civil engineer, Demonstration, and Handouts Education comprehension: verbalized understanding and returned demonstration   HOME EXERCISE PROGRAM: TKR basic exercises template-AP, HS, SAQ, KP, Abd, SLR, seated long kick, knee flexion  ASSESSMENT:  CLINICAL IMPRESSION:  Mazal arrives today feeling better, pain is not nearly as severe recently. Spent some time working on manual techniques for edema control and addressed quad spasms as able, otherwise worked on quad strength and ROM today. Will continue to progress as able.    OBJECTIVE IMPAIRMENTS Abnormal gait, decreased activity tolerance, decreased balance, decreased coordination, decreased endurance, decreased mobility, difficulty walking, decreased ROM, decreased strength, hypomobility, increased edema, increased muscle spasms, impaired flexibility, and pain.   ACTIVITY LIMITATIONS bending, standing, squatting, stairs, transfers, reach over head, and locomotion level  PARTICIPATION LIMITATIONS: meal prep, cleaning, laundry, driving, shopping, and yard work  PERSONAL FACTORS Age and Past/current experiences are also affecting patient's functional outcome.   REHAB POTENTIAL: Good  CLINICAL DECISION MAKING: Evolving/moderate complexity  EVALUATION COMPLEXITY: Moderate   GOALS: Goals reviewed with patient? Yes  SHORT TERM GOALS: Target date: 05/02/2022  I with basic HEP Baseline: Goal status: ongoing  2.  Increase R knee ROM to at least 7-95 Baseline: 11-86 Goal status: ongoing  3.  Increase R knee strength to at least 3+/5 Baseline: 3 Goal status: ongoing  LONG TERM GOALS: Target date: 06/27/2022   I with final HEP Baseline:   Goal status: INITIAL  2.  Increase R knee ROM to at least 5-120 Baseline: 11-86 Goal status: INITIAL  3.  Patient will climb up and down at least 14 steps with R rail going up, MI, step over step going up. Baseline:  Goal status: INITIAL  4.  Increase r knee strength to at least 4/5 Baseline: 3 Goal status: INITIAL  5.  Patient will ambulate at least  400' with LRAD on level and unlevel surfaces, MI Baseline: limited distance, RW Goal status: INITIAL  6.  Increase FOTO score to at least 49 Baseline: 26 Goal status: INITIAL  7.  Complete TUG in < 12 sec with LRAD Baseline: 17.72 Goals status: INITIAL   PLAN: PT FREQUENCY: 3x/week  PT DURATION: 12 weeks  PLANNED INTERVENTIONS: Therapeutic exercises, Therapeutic activity, Neuromuscular re-education, Balance training, Gait training, Patient/Family education, Self Care, Joint mobilization, Stair training, Dry Needling, Cryotherapy, Moist heat, Vasopneumatic device, Ionotophoresis '4mg'$ /ml Dexamethasone, and Manual therapy  PLAN FOR NEXT SESSION: ROM, strength, functional mobility re-education, pain management.   Ann Lions PT DPT PN2  04/12/2022, 11:47 AM

## 2022-04-15 ENCOUNTER — Encounter: Payer: Self-pay | Admitting: Physical Therapy

## 2022-04-15 ENCOUNTER — Ambulatory Visit: Payer: 59 | Attending: Student | Admitting: Physical Therapy

## 2022-04-15 DIAGNOSIS — M25561 Pain in right knee: Secondary | ICD-10-CM | POA: Diagnosis present

## 2022-04-15 DIAGNOSIS — R278 Other lack of coordination: Secondary | ICD-10-CM | POA: Insufficient documentation

## 2022-04-15 DIAGNOSIS — R2681 Unsteadiness on feet: Secondary | ICD-10-CM | POA: Diagnosis present

## 2022-04-15 DIAGNOSIS — R262 Difficulty in walking, not elsewhere classified: Secondary | ICD-10-CM | POA: Insufficient documentation

## 2022-04-15 DIAGNOSIS — M6281 Muscle weakness (generalized): Secondary | ICD-10-CM | POA: Diagnosis present

## 2022-04-15 DIAGNOSIS — R600 Localized edema: Secondary | ICD-10-CM | POA: Diagnosis present

## 2022-04-15 NOTE — Therapy (Signed)
OUTPATIENT PHYSICAL THERAPY LOWER EXTREMITY TREATMENT   Patient Name: [name] [name] MRN: [id number] DOB:1945/12/24, 76 y.o., female Today's Date: 04/15/2022   PT End of Session - 04/15/22 1259     Visit Number 5    Date for PT Re-Evaluation 06/27/22    PT Start Time 1300    PT Stop Time 1324    PT Time Calculation (min) 45 min    Activity Tolerance No increased pain;Patient tolerated treatment well    Behavior During Therapy [facility] for tasks assessed/performed                Past Medical History:  Diagnosis Date   Allergic rhinitis    Anxiety    Arthritis    Cataract    Depression    Diabetes mellitus without complication (HCC)    borderline   Emphysema lung ([place])    Early stage   Endometrial polyp    Hyperlipidemia    Hyperlipidemia    on simvastatin   Hypothyroidism    IBS (irritable bowel syndrome)    Obesity    Osteoarthritis    Seasonal allergies    Sleep apnea    cpap   Past Surgical History:  Procedure Laterality Date   BUNIONECTOMY  2009   Dr.Duda, left   CARPAL TUNNEL RELEASE Right 11/28/2021   Procedure: RIGHT CARPAL TUNNEL RELEASE;  Surgeon: Sherilyn Cooter, MD;  Location: [place];  Service: Orthopedics;  Laterality: Right;   EYE SURGERY     HYSTEROSCOPY WITH D & C  04/24/2011   Procedure: DILATATION AND CURETTAGE (D&C) /HYSTEROSCOPY;  Surgeon: Eldred Manges, MD;  Location: [place] [facility];  Service: Gynecology;  Laterality: N/A;   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   Dr Chrys Racer FASCIA SURGERY  1994   Dr Percell Miller   POLYPECTOMY  04/24/2011   Procedure: POLYPECTOMY;  Surgeon: Eldred Manges, MD;  Location: [place] [facility];  Service: Gynecology;  Laterality: N/A;   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   Procedure: TOTAL KNEE ARTHROPLASTY;  Surgeon: Gaynelle Arabian, MD;  Location: [facility];  Service: Orthopedics;  Laterality: Right;   Patient Active Problem List   Diagnosis Date Noted   Osteoarthritis of right knee 04/01/2022    History of obstructive sleep apnea 02/11/2022   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 serious comorbidity with body mass index (BMI) of 37.0 to 37.9 in adult 02/11/2022   Weight loss observed on examination 02/11/2022   Pre-operative clearance 01/17/2022   Primary osteoarthritis of right knee 01/17/2022   Carpal tunnel syndrome, right upper limb 11/15/2021   Numbness 11/02/2021   OSA (obstructive sleep apnea) 07/24/2020   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/13/2020   Upper airway cough syndrome 06/13/2020   Class 3 severe obesity due to excess calories with serious comorbidity and body mass index (BMI) of 45.0 to 49.9 in adult [facility]) 06/13/2020   MCI (mild cognitive impairment) 06/13/2020   Insulin resistance 05/09/2020   Insomnia 05/09/2020   Vitamin D deficiency 02/17/2019   Diarrhea 02/17/2019   Essential hypertension 02/17/2019   Class 3 severe obesity due to excess calories with serious comorbidity and body mass index (BMI) of 40.0 to 44.9 in adult ([place]) 02/17/2019   Hair loss 02/17/2019   Anxiety 11/30/2017   Primary osteoarthritis of both knees 11/30/2017   Abnormal WBC count 11/30/2017   Obesity (BMI 30-39.9) 07/23/2013   Suspicious mole 02/01/2013   Palpitations 07/23/2012   Hyperlipidemia 01/28/2012   Morbid obesity ([place]) 04/24/2011  Preventative health care 02/20/2011   Skin lump of leg 10/10/2010   SORE THROAT 08/15/2010   LYMPHADENOPATHY 08/15/2010   TOBACCO USE 01/17/2010   Hyperlipidemia associated with type 2 diabetes mellitus ([place]) 04/05/2008   TINNITUS, CHRONIC, 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   ALLERGIC RHINITIS 01/25/2008   LIVER FUNCTION TESTS, ABNORMAL 01/25/2008   Hypothyroidism 01/13/2007   Depression with anxiety 01/13/2007   TRANSIENT GLOBAL AMNESIA 01/13/2007   FOOT SURGERY, HX OF 01/13/2007    PCP: Roma Schanz  REFERRING PROVIDER: Alluisio  REFERRING DIAG: R  TKR  THERAPY DIAG:  Acute pain of right knee  Difficulty in walking, not elsewhere  classified  Localized edema  Muscle weakness (generalized)  Rationale for Evaluation and Treatment Rehabilitation  ONSET DATE: 04/01/22  SUBJECTIVE:   SUBJECTIVE STATEMENT:  "Im feeling tired, because last night I didn't get to go to sleep until 5".  PERTINENT HISTORY: History of Present Illness Pt is a 76yo female presenting s/p R-TKA on 04/01/22. PMH: DM, Emphysema, HLD, Hypothyroidism, IBS, OSA on CPAP, R-carpal tunnel release 11/28/21, mild cognitive impairment  PAIN:  Are you having pain? Yes: NPRS scale: 1/10 Pain location: R knee Pain description: "hurting and annoying"  Aggravating factors: bending and getting up from a chair and toilet Relieving factors: pain medication  PRECAUTIONS: None  WEIGHT BEARING RESTRICTIONS No  FALLS:  Has patient fallen in last 6 months? No  LIVING ENVIRONMENT: Lives with: lives with their spouse Lives in: House/apartment Stairs: Yes: Internal: 14 steps; on right going up and External: 3 steps; none, has grab bar at the top Has following equipment at home: Gilford Rile - 2 wheeled, Environmental consultant - 4 wheeled, Electronics engineer, and Grab bars  OCCUPATION: retired  PLOF: Independent  PATIENT GOALS :Be able to walk with a normal gait, minimal pain.   OBJECTIVE:   PATIENT SURVEYS:  FOTO 26.11  COGNITION:  Overall cognitive status: Within functional limits for tasks assessed     SENSATION: Not tested  EDEMA:  R knee with mod edema  MUSCLE LENGTH: Hamstrings: Right deferred deg; Left 80 deg  POSTURE: rounded shoulders and flexed trunk   PALPATION: TTP around R knee.  LOWER EXTREMITY ROM: LLE ROM WFL, R hip and ankle [facility]  Active ROM Right eval Left eval 9/29 04/15/22 AROM  Hip flexion      Hip extension      Hip abduction      Hip adduction      Hip internal rotation      Hip external rotation      Knee flexion 86  100 PROM/98 AROM 106  Knee extension 11  8 AROM 6  Ankle dorsiflexion      Ankle plantarflexion      Ankle  inversion      Ankle eversion       (Blank rows = not tested)  LOWER EXTREMITY MMT:  MMT Right eval Left eval  Hip flexion 3   Hip extension 3   Hip abduction    Hip adduction    Hip internal rotation    Hip external rotation    Knee flexion 3+   Knee extension 3   Ankle dorsiflexion 4-   Ankle plantarflexion    Ankle inversion    Ankle eversion     (Blank rows = not tested)   FUNCTIONAL TESTS:  5 times sit to stand: 18.69 Timed up and go (TUG): 17.72  GAIT: Distance walked: In clinic Assistive device utilized: Walker - 2 wheeled Level of assistance:  SBA Comments: Antalgic gait, leans on BUE support    TODAY'S TREATMENT: 04/15/22 NuStep L5x 6 min  R knee PROM with end range holds RLE HS curls red 2x15 RLE LAQ 3lb 2x10  S2S 2x10 Vaso R knee Low pressure, 34 deg x10 min  04/12/22  Manual:  Retrograde massage with LE elevated  R quad ball massage with LE elevated Overpressure into knee extension x10  Overpressure into knee flexion x10   TherEx:  Attempted quad sets, significant compensation with glutes even with heavy cues SAQs 0# x15 with 3 second holds in extension  LAQs 0# x15 with 5 second holds in full extension   Lots of education about general recovery from TKR surgery/reasonable expectations for recovery, healing process and reasonable time frames, edema and mm spasm control techniques at home     04/10/22 NuStep L4 x 6 minutes   Supine knee press with foot elevated to stretch into extension 5 x 5, overpressure from therapist HS x 10 reps SAQ with 2# resistance, 10 reps SLR x 10, repeat with hip in ER x 10 Seated Act and AA knee flexion ROM-AROM 107 Step ups onto 4" step with SPC and CGA 10 reps. Stair climbing up and down 5 steps using RLE as main leg, with rail and CGA. VASO x 10 min, med pressure, 34 degrees to R knee  04/08/22 NuStep L4 x 6 minutes Supine knee exercises-KP with heel elevated, heel slides, SAQ, SLR 10 reps each Seated  long kicks and knee flexion, 10 each L knee active flexion to 96 degrees. Step ups onto 2" step with BUE support for balance only 10 reps Rle VASO to R knee 34, med, x 10 minutes.  Education, There ex   PATIENT EDUCATION:  Education details: HEP, POC Person educated: Patient Education method: Consulting civil engineer, Demonstration, and Handouts Education comprehension: verbalized understanding and returned demonstration   HOME EXERCISE PROGRAM: TKR basic exercises template-AP, HS, SAQ, KP, Abd, SLR, seated long kick, knee flexion  ASSESSMENT:  CLINICAL IMPRESSION:  Nyeisha arrives today feeling well with little pain. She ambulates in with [facility] on the wrong side as well as being too tall. SPC was adjusted to appropriate height, pt given skill instruction on proper SPC use and returned demonstration.  Spent some time working on manual PROM with little pain. She has improved her R knee AROM in both directions. Cues needed to hold quad contraction with LAQ. Some initial difficulty wiht S2S but it improved as reps progressed.   OBJECTIVE IMPAIRMENTS Abnormal gait, decreased activity tolerance, decreased balance, decreased coordination, decreased endurance, decreased mobility, difficulty walking, decreased ROM, decreased strength, hypomobility, increased edema, increased muscle spasms, impaired flexibility, and pain.   ACTIVITY LIMITATIONS bending, standing, squatting, stairs, transfers, reach over head, and locomotion level  PARTICIPATION LIMITATIONS: meal prep, cleaning, laundry, driving, shopping, and yard work  PERSONAL FACTORS Age and Past/current experiences are also affecting patient's functional outcome.   REHAB POTENTIAL: Good  CLINICAL DECISION MAKING: Evolving/moderate complexity  EVALUATION COMPLEXITY: Moderate   GOALS: Goals reviewed with patient? Yes  SHORT TERM GOALS: Target date: 05/02/2022  I with basic HEP Baseline: Goal status: ongoing  2.  Increase R knee ROM to at  least 7-95 Baseline: 11-86 Goal status: ongoing  3.  Increase R knee strength to at least 3+/5 Baseline: 3 Goal status: ongoing  LONG TERM GOALS: Target date: 06/27/2022   I with final HEP Baseline:  Goal status: INITIAL  2.  Increase R knee ROM to at least 5-120  Baseline: 11-86 Goal status: INITIAL  3.  Patient will climb up and down at least 14 steps with R rail going up, MI, step over step going up. Baseline:  Goal status: INITIAL  4.  Increase r knee strength to at least 4/5 Baseline: 3 Goal status: INITIAL  5.  Patient will ambulate at least 400' with LRAD on level and unlevel surfaces, MI Baseline: limited distance, RW Goal status: INITIAL  6.  Increase FOTO score to at least 49 Baseline: 26 Goal status: INITIAL  7.  Complete TUG in < 12 sec with LRAD Baseline: 17.72 Goals status: INITIAL   PLAN: PT FREQUENCY: 3x/week  PT DURATION: 12 weeks  PLANNED INTERVENTIONS: Therapeutic exercises, Therapeutic activity, Neuromuscular re-education, Balance training, Gait training, Patient/Family education, Self Care, Joint mobilization, Stair training, Dry Needling, Cryotherapy, Moist heat, Vasopneumatic device, Ionotophoresis '4mg'$ /ml Dexamethasone, and Manual therapy  PLAN FOR NEXT SESSION: ROM, strength, functional mobility re-education, pain management.   Kristen U PT DPT PN2  04/15/2022, 1:00 PM

## 2022-04-16 ENCOUNTER — Encounter: Payer: Self-pay | Admitting: Neurology

## 2022-04-17 ENCOUNTER — Encounter: Payer: Self-pay | Admitting: Physical Therapy

## 2022-04-17 ENCOUNTER — Other Ambulatory Visit: Payer: Self-pay | Admitting: Neurology

## 2022-04-17 ENCOUNTER — Ambulatory Visit: Payer: 59 | Admitting: Physical Therapy

## 2022-04-17 DIAGNOSIS — R278 Other lack of coordination: Secondary | ICD-10-CM

## 2022-04-17 DIAGNOSIS — M25561 Pain in right knee: Secondary | ICD-10-CM | POA: Diagnosis not present

## 2022-04-17 DIAGNOSIS — R600 Localized edema: Secondary | ICD-10-CM

## 2022-04-17 DIAGNOSIS — R262 Difficulty in walking, not elsewhere classified: Secondary | ICD-10-CM

## 2022-04-17 DIAGNOSIS — M6281 Muscle weakness (generalized): Secondary | ICD-10-CM

## 2022-04-17 DIAGNOSIS — G4733 Obstructive sleep apnea (adult) (pediatric): Secondary | ICD-10-CM

## 2022-04-17 DIAGNOSIS — R2681 Unsteadiness on feet: Secondary | ICD-10-CM

## 2022-04-17 NOTE — Therapy (Signed)
OUTPATIENT PHYSICAL THERAPY LOWER EXTREMITY TREATMENT   Patient Name: [name] [name] MRN: [id number] DOB:1946/04/30, 76 y.o., female Today's Date: 04/17/2022   PT End of Session - 04/17/22 1317     Visit Number 6    Date for PT Re-Evaluation 06/27/22    PT Start Time 1315    PT Stop Time 2947    PT Time Calculation (min) 40 min    Activity Tolerance No increased pain;Patient tolerated treatment well    Behavior During Therapy [facility] for tasks assessed/performed                 Past Medical History:  Diagnosis Date   Allergic rhinitis    Anxiety    Arthritis    Cataract    Depression    Diabetes mellitus without complication (HCC)    borderline   Emphysema lung ([place])    Early stage   Endometrial polyp    Hyperlipidemia    Hyperlipidemia    on simvastatin   Hypothyroidism    IBS (irritable bowel syndrome)    Obesity    Osteoarthritis    Seasonal allergies    Sleep apnea    cpap   Past Surgical History:  Procedure Laterality Date   BUNIONECTOMY  2009   Dr.Duda, left   CARPAL TUNNEL RELEASE Right 11/28/2021   Procedure: RIGHT CARPAL TUNNEL RELEASE;  Surgeon: Sherilyn Cooter, MD;  Location: [place];  Service: Orthopedics;  Laterality: Right;   EYE SURGERY     HYSTEROSCOPY WITH D & C  04/24/2011   Procedure: DILATATION AND CURETTAGE (D&C) /HYSTEROSCOPY;  Surgeon: Eldred Manges, MD;  Location: [place] [facility];  Service: Gynecology;  Laterality: N/A;   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   Dr Chrys Racer FASCIA SURGERY  1994   Dr Percell Miller   POLYPECTOMY  04/24/2011   Procedure: POLYPECTOMY;  Surgeon: Eldred Manges, MD;  Location: [place] [facility];  Service: Gynecology;  Laterality: N/A;   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   Procedure: TOTAL KNEE ARTHROPLASTY;  Surgeon: Gaynelle Arabian, MD;  [facility];  Service: Orthopedics;  Laterality: Right;   Patient Active Problem List   Diagnosis Date Noted   Osteoarthritis of right knee  04/01/2022   History of obstructive sleep apnea 02/11/2022   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 serious comorbidity with body mass index (BMI) of 37.0 to 37.9 in adult 02/11/2022   Weight loss observed on examination 02/11/2022   Pre-operative clearance 01/17/2022   Primary osteoarthritis of right knee 01/17/2022   Carpal tunnel syndrome, right upper limb 11/15/2021   Numbness 11/02/2021   OSA (obstructive sleep apnea) 07/24/2020   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/13/2020   Upper airway cough syndrome 06/13/2020   Class 3 severe obesity due to excess calories with serious comorbidity and body mass index (BMI) of 45.0 to 49.9 in adult [facility]) 06/13/2020   MCI (mild cognitive impairment) 06/13/2020   Insulin resistance 05/09/2020   Insomnia 05/09/2020   Vitamin D deficiency 02/17/2019   Diarrhea 02/17/2019   Essential hypertension 02/17/2019   Class 3 severe obesity due to excess calories with serious comorbidity and body mass index (BMI) of 40.0 to 44.9 in adult ([place]) 02/17/2019   Hair loss 02/17/2019   Anxiety 11/30/2017   Primary osteoarthritis of both knees 11/30/2017   Abnormal WBC count 11/30/2017   Obesity (BMI 30-39.9) 07/23/2013   Suspicious mole 02/01/2013   Palpitations 07/23/2012   Hyperlipidemia 01/28/2012   Morbid obesity ([place])  04/24/2011   Preventative health care 02/20/2011   Skin lump of leg 10/10/2010   SORE THROAT 08/15/2010   LYMPHADENOPATHY 08/15/2010   TOBACCO USE 01/17/2010   Hyperlipidemia associated with type 2 diabetes mellitus ([place]) 04/05/2008   TINNITUS, CHRONIC, 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   ALLERGIC RHINITIS 01/25/2008   LIVER FUNCTION TESTS, ABNORMAL 01/25/2008   Hypothyroidism 01/13/2007   Depression with anxiety 01/13/2007   TRANSIENT GLOBAL AMNESIA 01/13/2007   FOOT SURGERY, HX OF 01/13/2007    PCP: Roma Schanz  REFERRING PROVIDER: Alluisio  REFERRING DIAG: R  TKR  THERAPY DIAG:  Acute pain of right knee  Difficulty in walking, not  elsewhere classified  Localized edema  Muscle weakness (generalized)  Other lack of coordination  Unsteadiness on feet  Rationale for Evaluation and Treatment Rehabilitation  ONSET DATE: 04/01/22  SUBJECTIVE:   SUBJECTIVE STATEMENT:  "Im feeling tired, because last night I didn't get to go to sleep until 5".  PERTINENT HISTORY: History of Present Illness Pt is a 76yo female presenting s/p R-TKA on 04/01/22. PMH: DM, Emphysema, HLD, Hypothyroidism, IBS, OSA on CPAP, R-carpal tunnel release 11/28/21, mild cognitive impairment  PAIN:  Are you having pain? Yes: NPRS scale: 1/10 Pain location: R knee Pain description: "hurting and annoying"  Aggravating factors: bending and getting up from a chair and toilet Relieving factors: pain medication  PRECAUTIONS: None  WEIGHT BEARING RESTRICTIONS No  FALLS:  Has patient fallen in last 6 months? No  LIVING ENVIRONMENT: Lives with: lives with their spouse Lives in: House/apartment Stairs: Yes: Internal: 14 steps; on right going up and External: 3 steps; none, has grab bar at the top Has following equipment at home: Gilford Rile - 2 wheeled, Environmental consultant - 4 wheeled, Electronics engineer, and Grab bars  OCCUPATION: retired  PLOF: Independent  PATIENT GOALS :Be able to walk with a normal gait, minimal pain.   OBJECTIVE:   PATIENT SURVEYS:  FOTO 26.11  COGNITION:  Overall cognitive status: Within functional limits for tasks assessed     SENSATION: Not tested  EDEMA:  R knee with mod edema  MUSCLE LENGTH: Hamstrings: Right deferred deg; Left 80 deg  POSTURE: rounded shoulders and flexed trunk   PALPATION: TTP around R knee.  LOWER EXTREMITY ROM: LLE ROM WFL, R hip and ankle [facility]  Active ROM Right eval Left eval 9/29 04/15/22 AROM  Hip flexion      Hip extension      Hip abduction      Hip adduction      Hip internal rotation      Hip external rotation      Knee flexion 86  100 PROM/98 AROM 106  Knee extension 11  8 AROM 6   Ankle dorsiflexion      Ankle plantarflexion      Ankle inversion      Ankle eversion       (Blank rows = not tested)  LOWER EXTREMITY MMT:  MMT Right eval Left eval  Hip flexion 3   Hip extension 3   Hip abduction    Hip adduction    Hip internal rotation    Hip external rotation    Knee flexion 3+   Knee extension 3   Ankle dorsiflexion 4-   Ankle plantarflexion    Ankle inversion    Ankle eversion     (Blank rows = not tested)   FUNCTIONAL TESTS:  5 times sit to stand: 18.69 Timed up and go (TUG): 17.72  GAIT: Distance walked:  In clinic Assistive device utilized: Walker - 2 wheeled Level of assistance: SBA Comments: Antalgic gait, leans on BUE support    TODAY'S TREATMENT: 04/17/22 NuStep L5 x 6 minutes Gentle patellar mobs and STM to R ITB Supine knee ext with heel propped and over pressure, 4 x 5 seconds Supine SLR with 1#, 10 reps, repeat with ER Seated AROM and AAROM knee flexion-115 AAROM Step up, then step down on Airex with LLE, R foot remaining on pad, 10 steps each direction with SPC L and HHA on R, able to perform much of the stepping with RLE. VASO to R knee x 10 minutes 34 degrees, low pressure.  04/15/22 NuStep L5x 6 min  R knee PROM with end range holds RLE HS curls red 2x15 RLE LAQ 3lb 2x10  S2S 2x10 Vaso R knee Low pressure, 34 deg x10 min  04/12/22 Manual: Retrograde massage with LE elevated  R quad ball massage with LE elevated Overpressure into knee extension x10  Overpressure into knee flexion x10   TherEx: Attempted quad sets, significant compensation with glutes even with heavy cues SAQs 0# x15 with 3 second holds in extension  LAQs 0# x15 with 5 second holds in full extension  Lots of education about general recovery from TKR surgery/reasonable expectations for recovery, healing process and reasonable time frames, edema and mm spasm control techniques at home   04/10/22 NuStep L4 x 6 minutes   Supine knee press with foot  elevated to stretch into extension 5 x 5, overpressure from therapist HS x 10 reps SAQ with 2# resistance, 10 reps SLR x 10, repeat with hip in ER x 10 Seated Act and AA knee flexion ROM-AROM 107 Step ups onto 4" step with SPC and CGA 10 reps. Stair climbing up and down 5 steps using RLE as main leg, with rail and CGA. VASO x 10 min, med pressure, 34 degrees to R knee  04/08/22 NuStep L4 x 6 minutes Supine knee exercises-KP with heel elevated, heel slides, SAQ, SLR 10 reps each Seated long kicks and knee flexion, 10 each L knee active flexion to 96 degrees. Step ups onto 2" step with BUE support for balance only 10 reps Rle VASO to R knee 34, med, x 10 minutes.  Education, There ex   PATIENT EDUCATION:  Education details: HEP, POC Person educated: Patient Education method: Consulting civil engineer, Demonstration, and Handouts Education comprehension: verbalized understanding and returned demonstration   HOME EXERCISE PROGRAM: TKR basic exercises template-AP, HS, SAQ, KP, Abd, SLR, seated long kick, knee flexion  ASSESSMENT:  CLINICAL IMPRESSION: Patient reports no issues. She returned to the Dr and had bandages removed. She is having difficulty sleeping due to pain. Continued with progressive strength and ROM to R knee F/B VASO for pain relief. Continued progress noted.  OBJECTIVE IMPAIRMENTS Abnormal gait, decreased activity tolerance, decreased balance, decreased coordination, decreased endurance, decreased mobility, difficulty walking, decreased ROM, decreased strength, hypomobility, increased edema, increased muscle spasms, impaired flexibility, and pain.   ACTIVITY LIMITATIONS bending, standing, squatting, stairs, transfers, reach over head, and locomotion level  PARTICIPATION LIMITATIONS: meal prep, cleaning, laundry, driving, shopping, and yard work  PERSONAL FACTORS Age and Past/current experiences are also affecting patient's functional outcome.   REHAB POTENTIAL:  Good  CLINICAL DECISION MAKING: Evolving/moderate complexity  EVALUATION COMPLEXITY: Moderate   GOALS: Goals reviewed with patient? Yes  SHORT TERM GOALS: Target date: 05/02/2022  I with basic HEP Baseline: Goal status: met  2.  Increase R knee ROM to at least  7-95 Baseline: 11-86 Goal status: ongoing  3.  Increase R knee strength to at least 3+/5 Baseline: 3 Goal status: ongoing  LONG TERM GOALS: Target date: 06/27/2022   I with final HEP Baseline:  Goal status: INITIAL  2.  Increase R knee ROM to at least 5-120 Baseline: 11-86 Goal status: ongoing  3.  Patient will climb up and down at least 14 steps with R rail going up, MI, step over step going up. Baseline:  Goal status: INITIAL  4.  Increase r knee strength to at least 4/5 Baseline: 3 Goal status: INITIAL  5.  Patient will ambulate at least 400' with LRAD on level and unlevel surfaces, MI Baseline: limited distance, RW Goal status: ongoing  6.  Increase FOTO score to at least 49 Baseline: 26 Goal status: INITIAL  7.  Complete TUG in < 12 sec with LRAD Baseline: 17.72 Goals status: ongoing   PLAN: PT FREQUENCY: 3x/week  PT DURATION: 12 weeks  PLANNED INTERVENTIONS: Therapeutic exercises, Therapeutic activity, Neuromuscular re-education, Balance training, Gait training, Patient/Family education, Self Care, Joint mobilization, Stair training, Dry Needling, Cryotherapy, Moist heat, Vasopneumatic device, Ionotophoresis 70m/ml Dexamethasone, and Manual therapy  PLAN FOR NEXT SESSION: ROM, strength, functional mobility re-education, pain management.   SEthel RanaDPT 04/17/22 1:53 PM  04/17/2022, 1:53 PM

## 2022-04-19 ENCOUNTER — Encounter: Payer: Self-pay | Admitting: Physical Therapy

## 2022-04-19 ENCOUNTER — Ambulatory Visit: Payer: 59 | Admitting: Physical Therapy

## 2022-04-19 DIAGNOSIS — M6281 Muscle weakness (generalized): Secondary | ICD-10-CM

## 2022-04-19 DIAGNOSIS — M25561 Pain in right knee: Secondary | ICD-10-CM | POA: Diagnosis not present

## 2022-04-19 DIAGNOSIS — R600 Localized edema: Secondary | ICD-10-CM

## 2022-04-19 DIAGNOSIS — R262 Difficulty in walking, not elsewhere classified: Secondary | ICD-10-CM

## 2022-04-19 NOTE — Therapy (Signed)
OUTPATIENT PHYSICAL THERAPY LOWER EXTREMITY TREATMENT   Patient Name: [name] [name] MRN: [id number] DOB:1945/11/19, 76 y.o., female Today's Date: 04/19/2022   PT End of Session - 04/19/22 1100     Visit Number 7    Date for PT Re-Evaluation 06/27/22    PT Start Time 1100    PT Stop Time 1145    PT Time Calculation (min) 45 min    Activity Tolerance No increased pain;Patient tolerated treatment well    Behavior During Therapy [facility] for tasks assessed/performed                 Past Medical History:  Diagnosis Date   Allergic rhinitis    Anxiety    Arthritis    Cataract    Depression    Diabetes mellitus without complication (HCC)    borderline   Emphysema lung ([place])    Early stage   Endometrial polyp    Hyperlipidemia    Hyperlipidemia    on simvastatin   Hypothyroidism    IBS (irritable bowel syndrome)    Obesity    Osteoarthritis    Seasonal allergies    Sleep apnea    cpap   Past Surgical History:  Procedure Laterality Date   BUNIONECTOMY  2009   Dr.Duda, left   CARPAL TUNNEL RELEASE Right 11/28/2021   Procedure: RIGHT CARPAL TUNNEL RELEASE;  Surgeon: Sherilyn Cooter, MD;  Location: [place];  Service: Orthopedics;  Laterality: Right;   EYE SURGERY     HYSTEROSCOPY WITH D & C  04/24/2011   Procedure: DILATATION AND CURETTAGE (D&C) /HYSTEROSCOPY;  Surgeon: Eldred Manges, MD;  Location: 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   Dr Chrys Racer FASCIA SURGERY  1994   Dr Percell Miller   POLYPECTOMY  04/24/2011   Procedure: POLYPECTOMY;  Surgeon: Eldred Manges, MD;  Location: [place] [facility];  Service: Gynecology;  Laterality: N/A;   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   Procedure: TOTAL KNEE ARTHROPLASTY;  Surgeon: Gaynelle Arabian, MD;  Location: [facility];  Service: Orthopedics;  Laterality: Right;   Patient Active Problem List   Diagnosis Date Noted   Osteoarthritis of right knee  04/01/2022   History of obstructive sleep apnea 02/11/2022   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 serious comorbidity with body mass index (BMI) of 37.0 to 37.9 in adult 02/11/2022   Weight loss observed on examination 02/11/2022   Pre-operative clearance 01/17/2022   Primary osteoarthritis of right knee 01/17/2022   Carpal tunnel syndrome, right upper limb 11/15/2021   Numbness 11/02/2021   OSA (obstructive sleep apnea) 07/24/2020   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/13/2020   Upper airway cough syndrome 06/13/2020   Class 3 severe obesity due to excess calories with serious comorbidity and body mass index (BMI) of 45.0 to 49.9 in adult [facility]) 06/13/2020   MCI (mild cognitive impairment) 06/13/2020   Insulin resistance 05/09/2020   Insomnia 05/09/2020   Vitamin D deficiency 02/17/2019   Diarrhea 02/17/2019   Essential hypertension 02/17/2019   Class 3 severe obesity due to excess calories with serious comorbidity and body mass index (BMI) of 40.0 to 44.9 in adult ([place]) 02/17/2019   Hair loss 02/17/2019   Anxiety 11/30/2017   Primary osteoarthritis of both knees 11/30/2017   Abnormal WBC count 11/30/2017   Obesity (BMI 30-39.9) 07/23/2013   Suspicious mole 02/01/2013   Palpitations 07/23/2012   Hyperlipidemia 01/28/2012   Morbid obesity ([place])  04/24/2011   Preventative health care 02/20/2011   Skin lump of leg 10/10/2010   SORE THROAT 08/15/2010   LYMPHADENOPATHY 08/15/2010   TOBACCO USE 01/17/2010   Hyperlipidemia associated with type 2 diabetes mellitus ([place]) 04/05/2008   TINNITUS, CHRONIC, 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   ALLERGIC RHINITIS 01/25/2008   LIVER FUNCTION TESTS, ABNORMAL 01/25/2008   Hypothyroidism 01/13/2007   Depression with anxiety 01/13/2007   TRANSIENT GLOBAL AMNESIA 01/13/2007   FOOT SURGERY, HX OF 01/13/2007    PCP: Roma Schanz  REFERRING PROVIDER: Alluisio  REFERRING DIAG: R  TKR  THERAPY DIAG:  Acute pain of right knee  Localized edema  Difficulty  in walking, not elsewhere classified  Muscle weakness (generalized)  Rationale for Evaluation and Treatment Rehabilitation  ONSET DATE: 04/01/22  SUBJECTIVE:   SUBJECTIVE STATEMENT:  "I finally slept now I cant wake up" knee is doing fine  PERTINENT HISTORY: History of Present Illness Pt is a 76yo female presenting s/p R-TKA on 04/01/22. PMH: DM, Emphysema, HLD, Hypothyroidism, IBS, OSA on CPAP, R-carpal tunnel release 11/28/21, mild cognitive impairment  PAIN:  Are you having pain? Yes: NPRS scale: 1/10 Pain location: R knee Pain description: "hurting and annoying"  Aggravating factors: bending and getting up from a chair and toilet Relieving factors: pain medication  PRECAUTIONS: None  WEIGHT BEARING RESTRICTIONS No  FALLS:  Has patient fallen in last 6 months? No  LIVING ENVIRONMENT: Lives with: lives with their spouse Lives in: House/apartment Stairs: Yes: Internal: 14 steps; on right going up and External: 3 steps; none, has grab bar at the top Has following equipment at home: Gilford Rile - 2 wheeled, Environmental consultant - 4 wheeled, Electronics engineer, and Grab bars  OCCUPATION: retired  PLOF: Independent  PATIENT GOALS :Be able to walk with a normal gait, minimal pain.   OBJECTIVE:   PATIENT SURVEYS:  FOTO 26.11  COGNITION:  Overall cognitive status: Within functional limits for tasks assessed     SENSATION: Not tested  EDEMA:  R knee with mod edema  MUSCLE LENGTH: Hamstrings: Right deferred deg; Left 80 deg  POSTURE: rounded shoulders and flexed trunk   PALPATION: TTP around R knee.  LOWER EXTREMITY ROM: LLE ROM WFL, R hip and ankle [facility]  Active ROM Right eval Left eval 9/29 04/15/22 AROM  Hip flexion      Hip extension      Hip abduction      Hip adduction      Hip internal rotation      Hip external rotation      Knee flexion 86  100 PROM/98 AROM 106  Knee extension 11  8 AROM 6  Ankle dorsiflexion      Ankle plantarflexion      Ankle inversion       Ankle eversion       (Blank rows = not tested)  LOWER EXTREMITY MMT:  MMT Right eval Left eval  Hip flexion 3   Hip extension 3   Hip abduction    Hip adduction    Hip internal rotation    Hip external rotation    Knee flexion 3+   Knee extension 3   Ankle dorsiflexion 4-   Ankle plantarflexion    Ankle inversion    Ankle eversion     (Blank rows = not tested)   FUNCTIONAL TESTS:  5 times sit to stand: 18.69 Timed up and go (TUG): 17.72  GAIT: Distance walked: In clinic Assistive device utilized: Walker - 2 wheeled Level of assistance:  SBA Comments: Antalgic gait, leans on BUE support    TODAY'S TREATMENT: 04/19/22 NuStep  4in step up 2x5 each  Resisted gait 20lb 4 way x 3 each   R knee PROM with end range holds RLE quad sets with over pressure working on extension LAQ RLE 2lb 2x10 RLE HS curls green 2x10 S2S on airex 2x10 Leg press 20lb 2x10   04/17/22 NuStep L5 x 6 minutes Gentle patellar mobs and STM to R ITB Supine knee ext with heel propped and over pressure, 4 x 5 seconds Supine SLR with 1#, 10 reps, repeat with ER Seated AROM and AAROM knee flexion-115 AAROM Step up, then step down on Airex with LLE, R foot remaining on pad, 10 steps each direction with SPC L and HHA on R, able to perform much of the stepping with RLE. VASO to R knee x 10 minutes 34 degrees, low pressure.  04/15/22 NuStep L5x 6 min  R knee PROM with end range holds RLE HS curls red 2x15 RLE LAQ 3lb 2x10  S2S 2x10 Vaso R knee Low pressure, 34 deg x10 min  04/12/22 Manual: Retrograde massage with LE elevated  R quad ball massage with LE elevated Overpressure into knee extension x10  Overpressure into knee flexion x10   TherEx: Attempted quad sets, significant compensation with glutes even with heavy cues SAQs 0# x15 with 3 second holds in extension  LAQs 0# x15 with 5 second holds in full extension  Lots of education about general recovery from TKR surgery/reasonable  expectations for recovery, healing process and reasonable time frames, edema and mm spasm control techniques at home   04/10/22 NuStep L4 x 6 minutes   Supine knee press with foot elevated to stretch into extension 5 x 5, overpressure from therapist HS x 10 reps SAQ with 2# resistance, 10 reps SLR x 10, repeat with hip in ER x 10 Seated Act and AA knee flexion ROM-AROM 107 Step ups onto 4" step with SPC and CGA 10 reps. Stair climbing up and down 5 steps using RLE as main leg, with rail and CGA. VASO x 10 min, med pressure, 34 degrees to R knee  04/08/22 NuStep L4 x 6 minutes Supine knee exercises-KP with heel elevated, heel slides, SAQ, SLR 10 reps each Seated long kicks and knee flexion, 10 each L knee active flexion to 96 degrees. Step ups onto 2" step with BUE support for balance only 10 reps Rle VASO to R knee 34, med, x 10 minutes.  Education, There ex   PATIENT EDUCATION:  Education details: HEP, POC Person educated: Patient Education method: Consulting civil engineer, Demonstration, and Handouts Education comprehension: verbalized understanding and returned demonstration   HOME EXERCISE PROGRAM: TKR basic exercises template-AP, HS, SAQ, KP, Abd, SLR, seated long kick, knee flexion  ASSESSMENT:  CLINICAL IMPRESSION: Patient reports no issues. Continued with progressive strength and ROM to R knee. Some instability present with step ups and resisted gait. R knee pain reported with passive extension. Continued progress noted. Pt denied modality post session.  OBJECTIVE IMPAIRMENTS Abnormal gait, decreased activity tolerance, decreased balance, decreased coordination, decreased endurance, decreased mobility, difficulty walking, decreased ROM, decreased strength, hypomobility, increased edema, increased muscle spasms, impaired flexibility, and pain.   ACTIVITY LIMITATIONS bending, standing, squatting, stairs, transfers, reach over head, and locomotion level  PARTICIPATION LIMITATIONS:  meal prep, cleaning, laundry, driving, shopping, and yard work  PERSONAL FACTORS Age and Past/current experiences are also affecting patient's functional outcome.   REHAB POTENTIAL: Good  CLINICAL DECISION  MAKING: Evolving/moderate complexity  EVALUATION COMPLEXITY: Moderate   GOALS: Goals reviewed with patient? Yes  SHORT TERM GOALS: Target date: 05/02/2022  I with basic HEP Baseline: Goal status: met  2.  Increase R knee ROM to at least 7-95 Baseline: 11-86 Goal status: ongoing  3.  Increase R knee strength to at least 3+/5 Baseline: 3 Goal status: ongoing  LONG TERM GOALS: Target date: 06/27/2022   I with final HEP Baseline:  Goal status: INITIAL  2.  Increase R knee ROM to at least 5-120 Baseline: 11-86 Goal status: ongoing  3.  Patient will climb up and down at least 14 steps with R rail going up, MI, step over step going up. Baseline:  Goal status: INITIAL  4.  Increase r knee strength to at least 4/5 Baseline: 3 Goal status: INITIAL  5.  Patient will ambulate at least 400' with LRAD on level and unlevel surfaces, MI Baseline: limited distance, RW Goal status: ongoing  6.  Increase FOTO score to at least 49 Baseline: 26 Goal status: INITIAL  7.  Complete TUG in < 12 sec with LRAD Baseline: 17.72 Goals status: ongoing   PLAN: PT FREQUENCY: 3x/week  PT DURATION: 12 weeks  PLANNED INTERVENTIONS: Therapeutic exercises, Therapeutic activity, Neuromuscular re-education, Balance training, Gait training, Patient/Family education, Self Care, Joint mobilization, Stair training, Dry Needling, Cryotherapy, Moist heat, Vasopneumatic device, Ionotophoresis 35m/ml Dexamethasone, and Manual therapy  PLAN FOR NEXT SESSION: ROM, strength, functional mobility re-education, pain management.   SEthel RanaDPT 04/19/22 11:00 AM  04/19/2022, 11:00 AM

## 2022-04-20 ENCOUNTER — Other Ambulatory Visit: Payer: Self-pay | Admitting: Family Medicine

## 2022-04-20 DIAGNOSIS — F418 Other specified anxiety disorders: Secondary | ICD-10-CM

## 2022-04-22 ENCOUNTER — Ambulatory Visit: Payer: 59 | Admitting: Physical Therapy

## 2022-04-22 ENCOUNTER — Encounter: Payer: Self-pay | Admitting: Physical Therapy

## 2022-04-22 DIAGNOSIS — R600 Localized edema: Secondary | ICD-10-CM

## 2022-04-22 DIAGNOSIS — R262 Difficulty in walking, not elsewhere classified: Secondary | ICD-10-CM

## 2022-04-22 DIAGNOSIS — R2681 Unsteadiness on feet: Secondary | ICD-10-CM

## 2022-04-22 DIAGNOSIS — M6281 Muscle weakness (generalized): Secondary | ICD-10-CM

## 2022-04-22 DIAGNOSIS — M25561 Pain in right knee: Secondary | ICD-10-CM | POA: Diagnosis not present

## 2022-04-22 DIAGNOSIS — R278 Other lack of coordination: Secondary | ICD-10-CM

## 2022-04-22 NOTE — Therapy (Signed)
OUTPATIENT PHYSICAL THERAPY LOWER EXTREMITY TREATMENT   Patient Name: [name] [name] MRN: [id number] DOB:1946/05/30, 76 y.o., female Today's Date: 04/22/2022   PT End of Session - 04/22/22 1323     Visit Number 8    Date for PT Re-Evaluation 06/27/22    PT Start Time 1320    PT Stop Time 2878    PT Time Calculation (min) 38 min    Activity Tolerance No increased pain;Patient tolerated treatment well    Behavior During Therapy [facility] for tasks assessed/performed                  Past Medical History:  Diagnosis Date   Allergic rhinitis    Anxiety    Arthritis    Cataract    Depression    Diabetes mellitus without complication (HCC)    borderline   Emphysema lung ([place])    Early stage   Endometrial polyp    Hyperlipidemia    Hyperlipidemia    on simvastatin   Hypothyroidism    IBS (irritable bowel syndrome)    Obesity    Osteoarthritis    Seasonal allergies    Sleep apnea    cpap   Past Surgical History:  Procedure Laterality Date   BUNIONECTOMY  2009   Dr.Duda, left   CARPAL TUNNEL RELEASE Right 11/28/2021   Procedure: RIGHT CARPAL TUNNEL RELEASE;  Surgeon: Sherilyn Cooter, MD;  Location: [place];  Service: Orthopedics;  Laterality: Right;   EYE SURGERY     HYSTEROSCOPY WITH D & C  04/24/2011   Procedure: DILATATION AND CURETTAGE (D&C) /HYSTEROSCOPY;  Surgeon: Eldred Manges, MD;  Location: [place] [facility];  Service: Gynecology;  Laterality: N/A;   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   Dr Chrys Racer FASCIA SURGERY  1994   Dr Percell Miller   POLYPECTOMY  04/24/2011   Procedure: POLYPECTOMY;  Surgeon: Eldred Manges, MD;  Location: [place] [facility];  Service: Gynecology;  Laterality: N/A;   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   Procedure: TOTAL KNEE ARTHROPLASTY;  Surgeon: Gaynelle Arabian, MD;  Location: [facility];  Service: Orthopedics;  Laterality: Right;   Patient Active Problem List   Diagnosis Date Noted   Osteoarthritis of right knee  04/01/2022   History of obstructive sleep apnea 02/11/2022   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 serious comorbidity with body mass index (BMI) of 37.0 to 37.9 in adult 02/11/2022   Weight loss observed on examination 02/11/2022   Pre-operative clearance 01/17/2022   Primary osteoarthritis of right knee 01/17/2022   Carpal tunnel syndrome, right upper limb 11/15/2021   Numbness 11/02/2021   OSA (obstructive sleep apnea) 07/24/2020   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/13/2020   Upper airway cough syndrome 06/13/2020   Class 3 severe obesity due to excess calories with serious comorbidity and body mass index (BMI) of 45.0 to 49.9 in adult [facility]) 06/13/2020   MCI (mild cognitive impairment) 06/13/2020   Insulin resistance 05/09/2020   Insomnia 05/09/2020   Vitamin D deficiency 02/17/2019   Diarrhea 02/17/2019   Essential hypertension 02/17/2019   Class 3 severe obesity due to excess calories with serious comorbidity and body mass index (BMI) of 40.0 to 44.9 in adult ([place]) 02/17/2019   Hair loss 02/17/2019   Anxiety 11/30/2017   Primary osteoarthritis of both knees 11/30/2017   Abnormal WBC count 11/30/2017   Obesity (BMI 30-39.9) 07/23/2013   Suspicious mole 02/01/2013   Palpitations 07/23/2012   Hyperlipidemia 01/28/2012   Morbid obesity (  [place]) 04/24/2011   Preventative health care 02/20/2011   Skin lump of leg 10/10/2010   SORE THROAT 08/15/2010   LYMPHADENOPATHY 08/15/2010   TOBACCO USE 01/17/2010   Hyperlipidemia associated with type 2 diabetes mellitus ([place]) 04/05/2008   TINNITUS, CHRONIC, 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   ALLERGIC RHINITIS 01/25/2008   LIVER FUNCTION TESTS, ABNORMAL 01/25/2008   Hypothyroidism 01/13/2007   Depression with anxiety 01/13/2007   TRANSIENT GLOBAL AMNESIA 01/13/2007   FOOT SURGERY, HX OF 01/13/2007    PCP: Roma Schanz  REFERRING PROVIDER: Alluisio  REFERRING DIAG: R  TKR  THERAPY DIAG:  Acute pain of right knee  Localized edema  Difficulty  in walking, not elsewhere classified  Muscle weakness (generalized)  Other lack of coordination  Unsteadiness on feet  Rationale for Evaluation and Treatment Rehabilitation  ONSET DATE: 04/01/22  SUBJECTIVE:   SUBJECTIVE STATEMENT: Patient reports that her knee feels stiff today, but no other pain.  PERTINENT HISTORY: History of Present Illness Pt is a 76yo female presenting s/p R-TKA on 04/01/22. PMH: DM, Emphysema, HLD, Hypothyroidism, IBS, OSA on CPAP, R-carpal tunnel release 11/28/21, mild cognitive impairment  PAIN:  Are you having pain? Yes: NPRS scale: 1/10 Pain location: R knee Pain description: "hurting and annoying"  Aggravating factors: bending and getting up from a chair and toilet Relieving factors: pain medication  PRECAUTIONS: None  WEIGHT BEARING RESTRICTIONS No  FALLS:  Has patient fallen in last 6 months? No  LIVING ENVIRONMENT: Lives with: lives with their spouse Lives in: House/apartment Stairs: Yes: Internal: 14 steps; on right going up and External: 3 steps; none, has grab bar at the top Has following equipment at home: Gilford Rile - 2 wheeled, Environmental consultant - 4 wheeled, Electronics engineer, and Grab bars  OCCUPATION: retired  PLOF: Independent  PATIENT GOALS :Be able to walk with a normal gait, minimal pain.   OBJECTIVE:   PATIENT SURVEYS:  FOTO 26.11  COGNITION:  Overall cognitive status: Within functional limits for tasks assessed     SENSATION: Not tested  EDEMA:  R knee with mod edema  MUSCLE LENGTH: Hamstrings: Right deferred deg; Left 80 deg  POSTURE: rounded shoulders and flexed trunk   PALPATION: TTP around R knee.  LOWER EXTREMITY ROM: LLE ROM WFL, R hip and ankle [facility]  Active ROM Right eval Left eval 9/29 04/15/22 AROM  Hip flexion      Hip extension      Hip abduction      Hip adduction      Hip internal rotation      Hip external rotation      Knee flexion 86  100 PROM/98 AROM 106  Knee extension 11  8 AROM 6  Ankle  dorsiflexion      Ankle plantarflexion      Ankle inversion      Ankle eversion       (Blank rows = not tested)  LOWER EXTREMITY MMT:  MMT Right eval Left eval  Hip flexion 3   Hip extension 3   Hip abduction    Hip adduction    Hip internal rotation    Hip external rotation    Knee flexion 3+   Knee extension 3   Ankle dorsiflexion 4-   Ankle plantarflexion    Ankle inversion    Ankle eversion     (Blank rows = not tested)   FUNCTIONAL TESTS:  5 times sit to stand: 18.69 Timed up and go (TUG): 17.72  GAIT: Distance walked: In clinic Assistive  device utilized: Environmental consultant - 2 wheeled Level of assistance: SBA Comments: Antalgic gait, leans on BUE support    TODAY'S TREATMENT: 04/22/22 NuStep L5 6 minutes STM to incision at distal points as it was bound down P and AROM of R knee flexion,  Leg curls, 20# x 10, 10 reps 5#, RLE only. Knee ext x 10 reps, 5# with BLE, eccentric knee ext on R 5#, 10 reps. Side to side step with G tband resistance at knees, x 10 reps    04/19/22 NuStep  4in step up 2x5 each  Resisted gait 20lb 4 way x 3 each   R knee PROM with end range holds RLE quad sets with over pressure working on extension LAQ RLE 2lb 2x10 RLE HS curls green 2x10 S2S on airex 2x10 Leg press 20lb 2x10   04/17/22 NuStep L5 x 6 minutes Gentle patellar mobs and STM to R ITB Supine knee ext with heel propped and over pressure, 4 x 5 seconds Supine SLR with 1#, 10 reps, repeat with ER Seated AROM and AAROM knee flexion-115 AAROM Step up, then step down on Airex with LLE, R foot remaining on pad, 10 steps each direction with SPC L and HHA on R, able to perform much of the stepping with RLE. VASO to R knee x 10 minutes 34 degrees, low pressure.  04/15/22 NuStep L5x 6 min  R knee PROM with end range holds RLE HS curls red 2x15 RLE LAQ 3lb 2x10  S2S 2x10 Vaso R knee Low pressure, 34 deg x10 min  04/12/22 Manual: Retrograde massage with LE elevated  R quad  ball massage with LE elevated Overpressure into knee extension x10  Overpressure into knee flexion x10   TherEx: Attempted quad sets, significant compensation with glutes even with heavy cues SAQs 0# x15 with 3 second holds in extension  LAQs 0# x15 with 5 second holds in full extension  Lots of education about general recovery from TKR surgery/reasonable expectations for recovery, healing process and reasonable time frames, edema and mm spasm control techniques at home   04/10/22 NuStep L4 x 6 minutes   Supine knee press with foot elevated to stretch into extension 5 x 5, overpressure from therapist HS x 10 reps SAQ with 2# resistance, 10 reps SLR x 10, repeat with hip in ER x 10 Seated Act and AA knee flexion ROM-AROM 107 Step ups onto 4" step with SPC and CGA 10 reps. Stair climbing up and down 5 steps using RLE as main leg, with rail and CGA. VASO x 10 min, med pressure, 34 degrees to R knee  04/08/22 NuStep L4 x 6 minutes Supine knee exercises-KP with heel elevated, heel slides, SAQ, SLR 10 reps each Seated long kicks and knee flexion, 10 each L knee active flexion to 96 degrees. Step ups onto 2" step with BUE support for balance only 10 reps Rle VASO to R knee 34, med, x 10 minutes.  Education, There ex   PATIENT EDUCATION:  Education details: HEP, POC Person educated: Patient Education method: Education officer, environmental, and Handouts Education comprehension: verbalized understanding and returned demonstration   HOME EXERCISE PROGRAM: TKR basic exercises template-AP, HS, SAQ, KP, Abd, SLR, seated long kick, knee flexion  ASSESSMENT:  CLINICAL IMPRESSION: Patient reports no issues, but she is concerned about regaining her knee flexion. Educated her to new ways of self stretch in sitting, plant foot and acoot hips forward, achieved 103. Progressed strengthening of R LE with good tolerance.  OBJECTIVE IMPAIRMENTS Abnormal  gait, decreased activity tolerance, decreased  balance, decreased coordination, decreased endurance, decreased mobility, difficulty walking, decreased ROM, decreased strength, hypomobility, increased edema, increased muscle spasms, impaired flexibility, and pain.   ACTIVITY LIMITATIONS bending, standing, squatting, stairs, transfers, reach over head, and locomotion level  PARTICIPATION LIMITATIONS: meal prep, cleaning, laundry, driving, shopping, and yard work  PERSONAL FACTORS Age and Past/current experiences are also affecting patient's functional outcome.   REHAB POTENTIAL: Good  CLINICAL DECISION MAKING: Evolving/moderate complexity  EVALUATION COMPLEXITY: Moderate   GOALS: Goals reviewed with patient? Yes  SHORT TERM GOALS: Target date: 05/02/2022  I with basic HEP Baseline: Goal status: met  2.  Increase R knee ROM to at least 7-95 Baseline: 11-86 Goal status: ongoing  3.  Increase R knee strength to at least 3+/5 Baseline: 3 Goal status: ongoing  LONG TERM GOALS: Target date: 06/27/2022   I with final HEP Baseline:  Goal status: INITIAL  2.  Increase R knee ROM to at least 5-120 Baseline: 11-86 Goal status: ongoing  3.  Patient will climb up and down at least 14 steps with R rail going up, MI, step over step going up. Baseline:  Goal status: INITIAL  4.  Increase r knee strength to at least 4/5 Baseline: 3 Goal status: INITIAL  5.  Patient will ambulate at least 400' with LRAD on level and unlevel surfaces, MI Baseline: limited distance, RW Goal status: ongoing  6.  Increase FOTO score to at least 49 Baseline: 26 Goal status: INITIAL  7.  Complete TUG in < 12 sec with LRAD Baseline: 17.72 Goals status: ongoing   PLAN: PT FREQUENCY: 3x/week  PT DURATION: 12 weeks  PLANNED INTERVENTIONS: Therapeutic exercises, Therapeutic activity, Neuromuscular re-education, Balance training, Gait training, Patient/Family education, Self Care, Joint mobilization, Stair training, Dry Needling, Cryotherapy,  Moist heat, Vasopneumatic device, Ionotophoresis 20m/ml Dexamethasone, and Manual therapy  PLAN FOR NEXT SESSION: try bike, HEP update   SEthel RanaDPT 04/22/22 1:59 PM  04/22/2022, 1:59 PM

## 2022-04-24 ENCOUNTER — Ambulatory Visit: Payer: 59 | Admitting: Physical Therapy

## 2022-04-24 ENCOUNTER — Encounter: Payer: Self-pay | Admitting: Physical Therapy

## 2022-04-24 DIAGNOSIS — R262 Difficulty in walking, not elsewhere classified: Secondary | ICD-10-CM

## 2022-04-24 DIAGNOSIS — R600 Localized edema: Secondary | ICD-10-CM

## 2022-04-24 DIAGNOSIS — M25561 Pain in right knee: Secondary | ICD-10-CM

## 2022-04-24 DIAGNOSIS — M6281 Muscle weakness (generalized): Secondary | ICD-10-CM

## 2022-04-24 NOTE — Therapy (Signed)
OUTPATIENT PHYSICAL THERAPY LOWER EXTREMITY TREATMENT   Patient Name: [name] [name] MRN: [id number] DOB:1946-02-24, 76 y.o., female Today's Date: 04/24/2022   PT End of Session - 04/24/22 1301     Visit Number 9    Date for PT Re-Evaluation 06/27/22    PT Start Time 1300    PT Stop Time 1345    PT Time Calculation (min) 45 min    Activity Tolerance No increased pain;Patient tolerated treatment well    Behavior During Therapy [facility] for tasks assessed/performed                  Past Medical History:  Diagnosis Date   Allergic rhinitis    Anxiety    Arthritis    Cataract    Depression    Diabetes mellitus without complication (HCC)    borderline   Emphysema lung ([place])    Early stage   Endometrial polyp    Hyperlipidemia    Hyperlipidemia    on simvastatin   Hypothyroidism    IBS (irritable bowel syndrome)    Obesity    Osteoarthritis    Seasonal allergies    Sleep apnea    cpap   Past Surgical History:  Procedure Laterality Date   BUNIONECTOMY  2009   Dr.Duda, left   CARPAL TUNNEL RELEASE Right 11/28/2021   Procedure: RIGHT CARPAL TUNNEL RELEASE;  Surgeon: Sherilyn Cooter, MD;  Location: [place];  Service: Orthopedics;  Laterality: Right;   EYE SURGERY     HYSTEROSCOPY WITH D & C  04/24/2011   Procedure: DILATATION AND CURETTAGE (D&C) /HYSTEROSCOPY;  Surgeon: Eldred Manges, MD;  Location: [facility];  Service: Gynecology;  Laterality: N/A;   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   Dr Chrys Racer FASCIA SURGERY  1994   Dr Percell Miller   POLYPECTOMY  04/24/2011   Procedure: POLYPECTOMY;  Surgeon: Eldred Manges, MD;  Location: [facility];  Service: Gynecology;  Laterality: N/A;   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   Procedure: TOTAL KNEE ARTHROPLASTY;  Surgeon: Gaynelle Arabian, MD;  Location: [facility];  Service: Orthopedics;  Laterality: Right;   Patient Active Problem List   Diagnosis Date Noted   Osteoarthritis of right knee  04/01/2022   History of obstructive sleep apnea 02/11/2022   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 serious comorbidity with body mass index (BMI) of 37.0 to 37.9 in adult 02/11/2022   Weight loss observed on examination 02/11/2022   Pre-operative clearance 01/17/2022   Primary osteoarthritis of right knee 01/17/2022   Carpal tunnel syndrome, right upper limb 11/15/2021   Numbness 11/02/2021   OSA (obstructive sleep apnea) 07/24/2020   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/13/2020   Upper airway cough syndrome 06/13/2020   Class 3 severe obesity due to excess calories with serious comorbidity and body mass index (BMI) of 45.0 to 49.9 in adult [facility]) 06/13/2020   MCI (mild cognitive impairment) 06/13/2020   Insulin resistance 05/09/2020   Insomnia 05/09/2020   Vitamin D deficiency 02/17/2019   Diarrhea 02/17/2019   Essential hypertension 02/17/2019   Class 3 severe obesity due to excess calories with serious comorbidity and body mass index (BMI) of 40.0 to 44.9 in adult ([place]) 02/17/2019   Hair loss 02/17/2019   Anxiety 11/30/2017   Primary osteoarthritis of both knees 11/30/2017   Abnormal WBC count 11/30/2017   Obesity (BMI 30-39.9) 07/23/2013   Suspicious mole 02/01/2013   Palpitations 07/23/2012   Hyperlipidemia 01/28/2012   Morbid obesity (  [place]) 04/24/2011   Preventative health care 02/20/2011   Skin lump of leg 10/10/2010   SORE THROAT 08/15/2010   LYMPHADENOPATHY 08/15/2010   TOBACCO USE 01/17/2010   Hyperlipidemia associated with type 2 diabetes mellitus ([place]) 04/05/2008   TINNITUS, CHRONIC, 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   ALLERGIC RHINITIS 01/25/2008   LIVER FUNCTION TESTS, ABNORMAL 01/25/2008   Hypothyroidism 01/13/2007   Depression with anxiety 01/13/2007   TRANSIENT GLOBAL AMNESIA 01/13/2007   FOOT SURGERY, HX OF 01/13/2007    PCP: Roma Schanz  REFERRING PROVIDER: Alluisio  REFERRING DIAG: R  TKR  THERAPY DIAG:  Acute pain of right knee  Localized edema  Difficulty  in walking, not elsewhere classified  Muscle weakness (generalized)  Rationale for Evaluation and Treatment Rehabilitation  ONSET DATE: 04/01/22  SUBJECTIVE:   SUBJECTIVE STATEMENT: "Im feeling good" My knee still hurts  PERTINENT HISTORY: History of Present Illness Pt is a 76yo female presenting s/p R-TKA on 04/01/22. PMH: DM, Emphysema, HLD, Hypothyroidism, IBS, OSA on CPAP, R-carpal tunnel release 11/28/21, mild cognitive impairment  PAIN:  Are you having pain? Yes: NPRS scale: 2/10 Pain location: R knee Pain description: "hurting and annoying"  Aggravating factors: bending and getting up from a chair and toilet Relieving factors: pain medication  PRECAUTIONS: None  WEIGHT BEARING RESTRICTIONS No  FALLS:  Has patient fallen in last 6 months? No  LIVING ENVIRONMENT: Lives with: lives with their spouse Lives in: House/apartment Stairs: Yes: Internal: 14 steps; on right going up and External: 3 steps; none, has grab bar at the top Has following equipment at home: Gilford Rile - 2 wheeled, Environmental consultant - 4 wheeled, Electronics engineer, and Grab bars  OCCUPATION: retired  PLOF: Independent  PATIENT GOALS :Be able to walk with a normal gait, minimal pain.   OBJECTIVE:   PATIENT SURVEYS:  FOTO 26.11  COGNITION:  Overall cognitive status: Within functional limits for tasks assessed     SENSATION: Not tested  EDEMA:  R knee with mod edema  MUSCLE LENGTH: Hamstrings: Right deferred deg; Left 80 deg  POSTURE: rounded shoulders and flexed trunk   PALPATION: TTP around R knee.  LOWER EXTREMITY ROM: LLE ROM WFL, R hip and ankle [facility]  Active ROM Right eval Left eval 9/29 04/15/22 AROM  Hip flexion      Hip extension      Hip abduction      Hip adduction      Hip internal rotation      Hip external rotation      Knee flexion 86  100 PROM/98 AROM 106  Knee extension 11  8 AROM 6  Ankle dorsiflexion      Ankle plantarflexion      Ankle inversion      Ankle eversion        (Blank rows = not tested)  LOWER EXTREMITY MMT:  MMT Right eval Left eval  Hip flexion 3   Hip extension 3   Hip abduction    Hip adduction    Hip internal rotation    Hip external rotation    Knee flexion 3+   Knee extension 3   Ankle dorsiflexion 4-   Ankle plantarflexion    Ankle inversion    Ankle eversion     (Blank rows = not tested)   FUNCTIONAL TESTS:  5 times sit to stand: 18.69 Timed up and go (TUG): 17.72  GAIT: Distance walked: In clinic Assistive device utilized: Walker - 2 wheeled Level of assistance: SBA Comments: Antalgic gait, leans  on BUE support    TODAY'S TREATMENT: 04/24/22 NuStep L4 x 6 min  R knee PROM with end range holds  S2S on airex 2x10  Resisted gait 30lb 4 way x 3 each   Leg curls, 20lb 2x10  Vaso R knee 10 min, low, 34 deg  04/22/22 NuStep L5 6 minutes STM to incision at distal points as it was bound down P and AROM of R knee flexion,  Leg curls, 20# x 10, 10 reps 5#, RLE only. Knee ext x 10 reps, 5# with BLE, eccentric knee ext on R 5#, 10 reps. Side to side step with G tband resistance at knees, x 10 reps    04/19/22 NuStep  4in step up 2x5 each  Resisted gait 20lb 4 way x 3 each   R knee PROM with end range holds RLE quad sets with over pressure working on extension LAQ RLE 2lb 2x10 RLE HS curls green 2x10 S2S on airex 2x10 Leg press 20lb 2x10   04/17/22 NuStep L5 x 6 minutes Gentle patellar mobs and STM to R ITB Supine knee ext with heel propped and over pressure, 4 x 5 seconds Supine SLR with 1#, 10 reps, repeat with ER Seated AROM and AAROM knee flexion-115 AAROM Step up, then step down on Airex with LLE, R foot remaining on pad, 10 steps each direction with SPC L and HHA on R, able to perform much of the stepping with RLE. VASO to R knee x 10 minutes 34 degrees, low pressure.  04/15/22 NuStep L5x 6 min  R knee PROM with end range holds RLE HS curls red 2x15 RLE LAQ 3lb 2x10  S2S 2x10 Vaso R knee Low  pressure, 34 deg x10 min  PATIENT EDUCATION:  Education details: HEP, POC Person educated: Patient Education method: Consulting civil engineer, Demonstration, and Handouts Education comprehension: verbalized understanding and returned demonstration   HOME EXERCISE PROGRAM: TKR basic exercises template-AP, HS, SAQ, KP, Abd, SLR, seated long kick, knee flexion  ASSESSMENT:  CLINICAL IMPRESSION: Patient reports no issues, she did well tolerating PROM. Some hip burning and fatigue reported with resisted side step. Pt also has some instability with resisted side steps. Fatigue reported with sit to stands.  OBJECTIVE IMPAIRMENTS Abnormal gait, decreased activity tolerance, decreased balance, decreased coordination, decreased endurance, decreased mobility, difficulty walking, decreased ROM, decreased strength, hypomobility, increased edema, increased muscle spasms, impaired flexibility, and pain.   ACTIVITY LIMITATIONS bending, standing, squatting, stairs, transfers, reach over head, and locomotion level  PARTICIPATION LIMITATIONS: meal prep, cleaning, laundry, driving, shopping, and yard work  PERSONAL FACTORS Age and Past/current experiences are also affecting patient's functional outcome.   REHAB POTENTIAL: Good  CLINICAL DECISION MAKING: Evolving/moderate complexity  EVALUATION COMPLEXITY: Moderate   GOALS: Goals reviewed with patient? Yes  SHORT TERM GOALS: Target date: 05/02/2022  I with basic HEP Baseline: Goal status: met  2.  Increase R knee ROM to at least 7-95 Baseline: 11-86 Goal status: ongoing  3.  Increase R knee strength to at least 3+/5 Baseline: 3 Goal status: ongoing  LONG TERM GOALS: Target date: 06/27/2022   I with final HEP Baseline:  Goal status: INITIAL  2.  Increase R knee ROM to at least 5-120 Baseline: 11-86 Goal status: ongoing  3.  Patient will climb up and down at least 14 steps with R rail going up, MI, step over step going up. Baseline:  Goal  status: INITIAL  4.  Increase r knee strength to at least 4/5 Baseline: 3 Goal status:  INITIAL  5.  Patient will ambulate at least 400' with LRAD on level and unlevel surfaces, MI Baseline: limited distance, RW Goal status: ongoing  6.  Increase FOTO score to at least 49 Baseline: 26 Goal status: INITIAL  7.  Complete TUG in < 12 sec with LRAD Baseline: 17.72 Goals status: ongoing   PLAN: PT FREQUENCY: 3x/week  PT DURATION: 12 weeks  PLANNED INTERVENTIONS: Therapeutic exercises, Therapeutic activity, Neuromuscular re-education, Balance training, Gait training, Patient/Family education, Self Care, Joint mobilization, Stair training, Dry Needling, Cryotherapy, Moist heat, Vasopneumatic device, Ionotophoresis 63m/ml Dexamethasone, and Manual therapy  PLAN FOR NEXT SESSION: try bike, HEP update   SEthel RanaDPT 04/24/22 1:04 PM  04/24/2022, 1:04 PM

## 2022-04-26 ENCOUNTER — Ambulatory Visit: Payer: 59 | Admitting: Physical Therapy

## 2022-04-26 DIAGNOSIS — R2681 Unsteadiness on feet: Secondary | ICD-10-CM

## 2022-04-26 DIAGNOSIS — M6281 Muscle weakness (generalized): Secondary | ICD-10-CM

## 2022-04-26 DIAGNOSIS — M25561 Pain in right knee: Secondary | ICD-10-CM

## 2022-04-26 DIAGNOSIS — R278 Other lack of coordination: Secondary | ICD-10-CM

## 2022-04-26 DIAGNOSIS — R262 Difficulty in walking, not elsewhere classified: Secondary | ICD-10-CM

## 2022-04-26 DIAGNOSIS — R600 Localized edema: Secondary | ICD-10-CM

## 2022-04-26 NOTE — Therapy (Signed)
OUTPATIENT PHYSICAL THERAPY LOWER EXTREMITY TREATMENT   Progress Note Reporting Period 04/04/22 to 04/26/22  See note below for Objective Data and Assessment of Progress/Goals.     Patient Name: [name] [name] MRN: [id number] DOB:[birth year], 76 y.o., female Today's Date: 04/26/2022   PT End of Session - 04/26/22 1115     Visit Number 10    Date for PT Re-Evaluation 06/27/22    PT Start Time 1100    PT Stop Time 1145    PT Time Calculation (min) 45 min    Activity Tolerance No increased pain;Patient tolerated treatment well    Behavior During Therapy [facility] for tasks assessed/performed                   Past Medical History:  Diagnosis Date   Allergic rhinitis    Anxiety    Arthritis    Cataract    Depression    Diabetes mellitus without complication (HCC)    borderline   Emphysema lung ([place])    Early stage   Endometrial polyp    Hyperlipidemia    Hyperlipidemia    on simvastatin   Hypothyroidism    IBS (irritable bowel syndrome)    Obesity    Osteoarthritis    Seasonal allergies    Sleep apnea    cpap   Past Surgical History:  Procedure Laterality Date   BUNIONECTOMY  2009   Dr.Duda, left   CARPAL TUNNEL RELEASE Right 11/28/2021   Procedure: RIGHT CARPAL TUNNEL RELEASE;  Surgeon: Sherilyn Cooter, MD;  Location: [place];  Service: Orthopedics;  Laterality: Right;   EYE SURGERY     HYSTEROSCOPY WITH D & C  04/24/2011   Procedure: DILATATION AND CURETTAGE (D&C) /HYSTEROSCOPY;  Surgeon: Eldred Manges, MD;  Location: [facility];  Service: Gynecology;  Laterality: N/A;   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   Dr Chrys Racer FASCIA SURGERY  1994   Dr Percell Miller   POLYPECTOMY  04/24/2011   Procedure: POLYPECTOMY;  Surgeon: Eldred Manges, MD;  Location: [facility];  Service: Gynecology;  Laterality: N/A;   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   Procedure: TOTAL KNEE ARTHROPLASTY;  Surgeon: Gaynelle Arabian, MD;  Location: [facility];   Service: Orthopedics;  Laterality: Right;   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   Upper airway cough syndrome 06/13/2020   Class 3 severe obesity due to excess calories with serious comorbidity and body mass index (BMI) of 45.0 to 49.9 in adult [facility]) 06/13/2020   MCI (mild cognitive impairment) 06/13/2020   Insulin resistance 05/09/2020   Insomnia 05/09/2020   Vitamin D deficiency 02/17/2019   Diarrhea 02/17/2019   Essential hypertension 02/17/2019   Class 3 severe obesity due to excess calories with serious comorbidity and body mass index (BMI) of 40.0 to 44.9 in adult ([place]) 02/17/2019   Hair loss 02/17/2019   Anxiety 11/30/2017   Primary osteoarthritis of both knees 11/30/2017   Abnormal WBC count 11/30/2017  Obesity (BMI 30-39.9) 07/23/2013   Suspicious mole 02/01/2013   Palpitations 07/23/2012   Hyperlipidemia 01/28/2012   Morbid obesity ([place]) 04/24/2011   Preventative health care 02/20/2011   Skin lump of leg 10/10/2010   SORE THROAT 08/15/2010   LYMPHADENOPATHY 08/15/2010   TOBACCO USE 01/17/2010   Hyperlipidemia associated with type 2 diabetes mellitus ([place]) 04/05/2008   TINNITUS, CHRONIC, 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   ALLERGIC RHINITIS 01/25/2008   LIVER FUNCTION TESTS, ABNORMAL 01/25/2008   Hypothyroidism 01/13/2007   Depression with anxiety 01/13/2007   TRANSIENT GLOBAL AMNESIA 01/13/2007   FOOT SURGERY, HX OF 01/13/2007    PCP: Roma Schanz  REFERRING PROVIDER: Alluisio  REFERRING DIAG: R  TKR  THERAPY DIAG:  Acute pain of right knee  Localized edema  Difficulty in walking, not elsewhere classified  Muscle weakness (generalized)  Other lack of coordination  Unsteadiness on feet  Rationale for Evaluation and Treatment Rehabilitation  ONSET DATE: 04/01/22  SUBJECTIVE:   SUBJECTIVE STATEMENT: Patient reports significant increase in pain and swelling in r knee yesterday. Swelling remains.  PERTINENT HISTORY: History of Present Illness Pt is a 76yo female presenting s/p R-TKA on 04/01/22. PMH: DM, Emphysema, HLD, Hypothyroidism, IBS, OSA on CPAP, R-carpal tunnel release 11/28/21, mild cognitive impairment  PAIN:  Are you having pain? Yes: NPRS scale: 2/10 Pain location: R knee Pain description: "hurting and annoying"  Aggravating factors: bending and getting up from a chair and toilet Relieving factors: pain medication  PRECAUTIONS: None  WEIGHT BEARING RESTRICTIONS No  FALLS:  Has patient fallen in last 6 months? No  LIVING ENVIRONMENT: Lives with: lives with their spouse Lives in: House/apartment Stairs: Yes: Internal: 14 steps; on right going up and External: 3 steps; none, has grab bar at the top Has following equipment at home: Gilford Rile - 2 wheeled, Environmental consultant - 4 wheeled, Electronics engineer, and Grab bars  OCCUPATION: retired  PLOF: Independent  PATIENT GOALS :Be able to walk with a normal gait, minimal pain.   OBJECTIVE:   PATIENT SURVEYS:  FOTO 26.11  COGNITION:  Overall cognitive status: Within functional limits for tasks assessed     SENSATION: Not tested  EDEMA:  R knee with mod edema  MUSCLE LENGTH: Hamstrings: Right deferred deg; Left 80 deg  POSTURE: rounded shoulders and flexed trunk   PALPATION: TTP around R knee.  LOWER EXTREMITY ROM: LLE ROM WFL, R hip and ankle [facility]  Active ROM Right eval Left eval 9/29 04/15/22 AROM  Hip flexion      Hip extension      Hip  abduction      Hip adduction      Hip internal rotation      Hip external rotation      Knee flexion 86  100 PROM/98 AROM 106  Knee extension 11  8 AROM 6  Ankle dorsiflexion      Ankle plantarflexion      Ankle inversion      Ankle eversion       (Blank rows = not tested)  LOWER EXTREMITY MMT:  MMT Right eval Left eval  Hip flexion 3   Hip extension 3   Hip abduction    Hip adduction    Hip internal rotation    Hip external rotation    Knee flexion 3+   Knee extension 3   Ankle dorsiflexion 4-   Ankle plantarflexion    Ankle inversion    Ankle eversion     (Blank rows = not tested)  FUNCTIONAL TESTS:  5 times sit to stand: 18.69 Timed up and go (TUG): 17.72  GAIT: Distance walked: In clinic Assistive device utilized: Walker - 2 wheeled Level of assistance: SBA Comments: Antalgic gait, leans on BUE support    TODAY'S TREATMENT: 04/26/22 Recumbent bike unable to fully turn the pedals, moved forward and back x 4 minutes. SAQ with 3# x 10 x 5 sec holds. Gentle AAROM for R knee ext SLR with 3# x 10 reps Self knee flexion ROM-105 degrees Seated knee flexion, 20#, BLE 10 reps Knee ext 5#, 10 reps, BLE VASO x 15 minutes Low, 38  04/24/22 NuStep L4 x 6 min  R knee PROM with end range holds  S2S on airex 2x10  Resisted gait 30lb 4 way x 3 each   Leg curls, 20lb 2x10  Vaso R knee 10 min, low, 34 deg  04/22/22 NuStep L5 6 minutes STM to incision at distal points as it was bound down P and AROM of R knee flexion,  Leg curls, 20# x 10, 10 reps 5#, RLE only. Knee ext x 10 reps, 5# with BLE, eccentric knee ext on R 5#, 10 reps. Side to side step with G tband resistance at knees, x 10 reps    04/19/22 NuStep  4in step up 2x5 each  Resisted gait 20lb 4 way x 3 each   R knee PROM with end range holds RLE quad sets with over pressure working on extension LAQ RLE 2lb 2x10 RLE HS curls green 2x10 S2S on airex 2x10 Leg press 20lb 2x10   04/17/22 NuStep L5  x 6 minutes Gentle patellar mobs and STM to R ITB Supine knee ext with heel propped and over pressure, 4 x 5 seconds Supine SLR with 1#, 10 reps, repeat with ER Seated AROM and AAROM knee flexion-115 AAROM Step up, then step down on Airex with LLE, R foot remaining on pad, 10 steps each direction with SPC L and HHA on R, able to perform much of the stepping with RLE. VASO to R knee x 10 minutes 34 degrees, low pressure.  04/15/22 NuStep L5x 6 min  R knee PROM with end range holds RLE HS curls red 2x15 RLE LAQ 3lb 2x10  S2S 2x10 Vaso R knee Low pressure, 34 deg x10 min  PATIENT EDUCATION:  Education details: HEP, POC Person educated: Patient Education method: Consulting civil engineer, Demonstration, and Handouts Education comprehension: verbalized understanding and returned demonstration   HOME EXERCISE PROGRAM: TKR basic exercises template-AP, HS, SAQ, KP, Abd, SLR, seated long kick, knee flexion  ASSESSMENT:  CLINICAL IMPRESSION: Patient reports increased  R knee pain started yesterday with increased stiffness. Assessment reveals warmth and mild swelling, with redness at R distal/lateral knee. Patient reports the redness is largely unchanged. She tolerated all activities, with ROM nearly normal for her. Educated her to monitor pain and redness and if they grow, contact Dr. Otherwise, keep moving and icing.  OBJECTIVE IMPAIRMENTS Abnormal gait, decreased activity tolerance, decreased balance, decreased coordination, decreased endurance, decreased mobility, difficulty walking, decreased ROM, decreased strength, hypomobility, increased edema, increased muscle spasms, impaired flexibility, and pain.   ACTIVITY LIMITATIONS bending, standing, squatting, stairs, transfers, reach over head, and locomotion level  PARTICIPATION LIMITATIONS: meal prep, cleaning, laundry, driving, shopping, and yard work  PERSONAL FACTORS Age and Past/current experiences are also affecting patient's functional outcome.    REHAB POTENTIAL: Good  CLINICAL DECISION MAKING: Evolving/moderate complexity  EVALUATION COMPLEXITY: Moderate   GOALS: Goals reviewed with patient? Yes  SHORT TERM  GOALS: Target date: 05/02/2022  I with basic HEP Baseline: Goal status: met  2.  Increase R knee ROM to at least 7-95 Baseline: 11-86 Goal status: ongoing  3.  Increase R knee strength to at least 3+/5 Baseline: 3 Goal status: ongoing  LONG TERM GOALS: Target date: 06/27/2022   I with final HEP Baseline:  Goal status: INITIAL  2.  Increase R knee ROM to at least 5-120 Baseline: 11-86 Goal status: ongoing  3.  Patient will climb up and down at least 14 steps with R rail going up, MI, step over step going up. Baseline:  Goal status: INITIAL  4.  Increase r knee strength to at least 4/5 Baseline: 3 Goal status: INITIAL  5.  Patient will ambulate at least 400' with LRAD on level and unlevel surfaces, MI Baseline: limited distance, RW Goal status: ongoing  6.  Increase FOTO score to at least 49 Baseline: 26 Goal status: INITIAL  7.  Complete TUG in < 12 sec with LRAD Baseline: 17.72 Goals status: ongoing   PLAN: PT FREQUENCY: 3x/week  PT DURATION: 12 weeks  PLANNED INTERVENTIONS: Therapeutic exercises, Therapeutic activity, Neuromuscular re-education, Balance training, Gait training, Patient/Family education, Self Care, Joint mobilization, Stair training, Dry Needling, Cryotherapy, Moist heat, Vasopneumatic device, Ionotophoresis 13m/ml Dexamethasone, and Manual therapy  PLAN FOR NEXT SESSION: try bike, HEP update   SEthel RanaDPT 04/26/22 11:37 AM  04/26/2022, 11:37 AM

## 2022-04-29 ENCOUNTER — Other Ambulatory Visit: Payer: Self-pay | Admitting: Family Medicine

## 2022-04-29 ENCOUNTER — Ambulatory Visit: Payer: 59 | Admitting: Physical Therapy

## 2022-04-29 ENCOUNTER — Encounter: Payer: Self-pay | Admitting: Physical Therapy

## 2022-04-29 DIAGNOSIS — M25561 Pain in right knee: Secondary | ICD-10-CM

## 2022-04-29 DIAGNOSIS — E039 Hypothyroidism, unspecified: Secondary | ICD-10-CM

## 2022-04-29 DIAGNOSIS — R278 Other lack of coordination: Secondary | ICD-10-CM

## 2022-04-29 DIAGNOSIS — R262 Difficulty in walking, not elsewhere classified: Secondary | ICD-10-CM

## 2022-04-29 DIAGNOSIS — R2681 Unsteadiness on feet: Secondary | ICD-10-CM

## 2022-04-29 DIAGNOSIS — M6281 Muscle weakness (generalized): Secondary | ICD-10-CM

## 2022-04-29 DIAGNOSIS — R600 Localized edema: Secondary | ICD-10-CM

## 2022-04-29 NOTE — Therapy (Signed)
OUTPATIENT PHYSICAL THERAPY LOWER EXTREMITY TREATMENT   Progress Note Reporting Period 04/04/22 to 04/26/22  See note below for Objective Data and Assessment of Progress/Goals.     Patient Name: [name] [name] MRN: [id number] DOB:1946/07/09, 76 y.o., female Today's Date: 04/29/2022   PT End of Session - 04/29/22 1319     Visit Number 11    Date for PT Re-Evaluation 06/27/22    PT Start Time 1315    PT Stop Time 7262    PT Time Calculation (min) 40 min    Activity Tolerance No increased pain;Patient tolerated treatment well    Behavior During Therapy [facility] for tasks assessed/performed                    Past Medical History:  Diagnosis Date   Allergic rhinitis    Anxiety    Arthritis    Cataract    Depression    Diabetes mellitus without complication (HCC)    borderline   Emphysema lung ([place])    Early stage   Endometrial polyp    Hyperlipidemia    Hyperlipidemia    on simvastatin   Hypothyroidism    IBS (irritable bowel syndrome)    Obesity    Osteoarthritis    Seasonal allergies    Sleep apnea    cpap   Past Surgical History:  Procedure Laterality Date   BUNIONECTOMY  2009   Dr.Duda, left   CARPAL TUNNEL RELEASE Right 11/28/2021   Procedure: RIGHT CARPAL TUNNEL RELEASE;  Surgeon: Sherilyn Cooter, MD;  Location: [place];  Service: Orthopedics;  Laterality: Right;   EYE SURGERY     HYSTEROSCOPY WITH D & C  04/24/2011   Procedure: DILATATION AND CURETTAGE (D&C) /HYSTEROSCOPY;  Surgeon: Eldred Manges, MD;  Location: [facility];  Service: Gynecology;  Laterality: N/A;   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   Dr Chrys Racer FASCIA SURGERY  1994   Dr Percell Miller   POLYPECTOMY  04/24/2011   Procedure: POLYPECTOMY;  Surgeon: Eldred Manges, MD;  Location: [facility];  Service: Gynecology;  Laterality: N/A;   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   Procedure: TOTAL KNEE ARTHROPLASTY;  Surgeon: Gaynelle Arabian, MD;  Location: [facility];   Service: Orthopedics;  Laterality: Right;   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   Upper airway cough syndrome 06/13/2020   Class 3 severe obesity due to excess calories with serious comorbidity and body mass index (BMI) of 45.0 to 49.9 in adult [facility]) 06/13/2020   MCI (mild cognitive impairment) 06/13/2020   Insulin resistance 05/09/2020   Insomnia 05/09/2020   Vitamin D deficiency 02/17/2019   Diarrhea 02/17/2019   Essential hypertension 02/17/2019   Class 3 severe obesity due to excess calories with serious comorbidity and body mass index (BMI) of 40.0 to 44.9 in adult ([place]) 02/17/2019   Hair loss 02/17/2019   Anxiety 11/30/2017   Primary osteoarthritis of both knees 11/30/2017   Abnormal WBC count  11/30/2017   Obesity (BMI 30-39.9) 07/23/2013   Suspicious mole 02/01/2013   Palpitations 07/23/2012   Hyperlipidemia 01/28/2012   Morbid obesity ([place]) 04/24/2011   Preventative health care 02/20/2011   Skin lump of leg 10/10/2010   SORE THROAT 08/15/2010   LYMPHADENOPATHY 08/15/2010   TOBACCO USE 01/17/2010   Hyperlipidemia associated with type 2 diabetes mellitus ([place]) 04/05/2008   TINNITUS, CHRONIC, 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   ALLERGIC RHINITIS 01/25/2008   LIVER FUNCTION TESTS, ABNORMAL 01/25/2008   Hypothyroidism 01/13/2007   Depression with anxiety 01/13/2007   TRANSIENT GLOBAL AMNESIA 01/13/2007   FOOT SURGERY, HX OF 01/13/2007    PCP: Roma Schanz  REFERRING PROVIDER: Alluisio  REFERRING DIAG: R  TKR  THERAPY DIAG:  Acute pain of right knee  Localized edema  Difficulty in walking, not elsewhere classified  Muscle weakness (generalized)  Other lack of coordination  Unsteadiness on feet  Rationale for Evaluation and Treatment Rehabilitation  ONSET DATE: 04/01/22  SUBJECTIVE:   SUBJECTIVE STATEMENT: Patient reports  that her knee pain comes and goes. Sit <> stand helps relieve pain. She feels the swelling is better as well.  PERTINENT HISTORY: History of Present Illness Pt is a 76yo female presenting s/p R-TKA on 04/01/22. PMH: DM, Emphysema, HLD, Hypothyroidism, IBS, OSA on CPAP, R-carpal tunnel release 11/28/21, mild cognitive impairment  PAIN:  Are you having pain? Yes: NPRS scale: 2/10 Pain location: R knee Pain description: "hurting and annoying"  Aggravating factors: bending and getting up from a chair and toilet Relieving factors: pain medication  PRECAUTIONS: None  WEIGHT BEARING RESTRICTIONS No  FALLS:  Has patient fallen in last 6 months? No  LIVING ENVIRONMENT: Lives with: lives with their spouse Lives in: House/apartment Stairs: Yes: Internal: 14 steps; on right going up and External: 3 steps; none, has grab bar at the top Has following equipment at home: Gilford Rile - 2 wheeled, Environmental consultant - 4 wheeled, Electronics engineer, and Grab bars  OCCUPATION: retired  PLOF: Independent  PATIENT GOALS :Be able to walk with a normal gait, minimal pain.   OBJECTIVE:   PATIENT SURVEYS:  FOTO 26.11  COGNITION:  Overall cognitive status: Within functional limits for tasks assessed     SENSATION: Not tested  EDEMA:  R knee with mod edema  MUSCLE LENGTH: Hamstrings: Right deferred deg; Left 80 deg  POSTURE: rounded shoulders and flexed trunk   PALPATION: TTP around R knee.  LOWER EXTREMITY ROM: LLE ROM WFL, R hip and ankle [facility]  Active ROM Right eval Left eval 9/29 04/15/22 AROM  Hip flexion       Hip extension      Hip abduction      Hip adduction      Hip internal rotation      Hip external rotation      Knee flexion 86  100 PROM/98 AROM 106  Knee extension 11  8 AROM 6  Ankle dorsiflexion      Ankle plantarflexion      Ankle inversion      Ankle eversion       (Blank rows = not tested)  LOWER EXTREMITY MMT:  MMT Right eval Left eval  Hip flexion 3   Hip extension 3   Hip abduction    Hip adduction    Hip internal rotation    Hip external rotation    Knee flexion 3+   Knee extension 3   Ankle dorsiflexion 4-   Ankle plantarflexion    Ankle inversion  Ankle eversion     (Blank rows = not tested)   FUNCTIONAL TESTS:  5 times sit to stand: 18.69 Timed up and go (TUG): 17.72  GAIT: Distance walked: In clinic Assistive device utilized: Walker - 2 wheeled Level of assistance: SBA Comments: Antalgic gait, leans on BUE support    TODAY'S TREATMENT: 04/29/22 Recumbent bike, able to turn full circles, but did shift to the side to get foot around. L1 x 5 minutes Supine knee ext with heel elevated on mat. Therapist provided slight overpressure for extension. 4 x 5 sec Seated AROM for knee flex x 10 reps Seated R knee flexion against 10# resistance, 2 x 10 reps Seated knee ext against 5#, Extend with BLE, R eccentric quads to return to start, 2 x 10 reps  04/26/22 Recumbent bike unable to fully turn the pedals, moved forward and back x 4 minutes. SAQ with 3# x 10 x 5 sec holds. Gentle AAROM for R knee ext SLR with 3# x 10 reps Self knee flexion ROM-105 degrees Seated knee flexion, 20#, BLE 10 reps Knee ext 5#, 10 reps, BLE VASO x 15 minutes Low, 38  04/24/22 NuStep L4 x 6 min  R knee PROM with end range holds  S2S on airex 2x10  Resisted gait 30lb 4 way x 3 each   Leg curls, 20lb 2x10  Vaso R knee 10 min, low, 34 deg  04/22/22 NuStep L5 6 minutes STM to incision at distal points as it was bound down P and AROM of R knee flexion,  Leg curls,  20# x 10, 10 reps 5#, RLE only. Knee ext x 10 reps, 5# with BLE, eccentric knee ext on R 5#, 10 reps. Side to side step with G tband resistance at knees, x 10 reps  04/19/22 NuStep  4in step up 2x5 each  Resisted gait 20lb 4 way x 3 each   R knee PROM with end range holds RLE quad sets with over pressure working on extension LAQ RLE 2lb 2x10 RLE HS curls green 2x10 S2S on airex 2x10 Leg press 20lb 2x10  04/17/22 NuStep L5 x 6 minutes Gentle patellar mobs and STM to R ITB Supine knee ext with heel propped and over pressure, 4 x 5 seconds Supine SLR with 1#, 10 reps, repeat with ER Seated AROM and AAROM knee flexion-115 AAROM Step up, then step down on Airex with LLE, R foot remaining on pad, 10 steps each direction with SPC L and HHA on R, able to perform much of the stepping with RLE. VASO to R knee x 10 minutes 34 degrees, low pressure.  04/15/22 NuStep L5x 6 min  R knee PROM with end range holds RLE HS curls red 2x15 RLE LAQ 3lb 2x10  S2S 2x10 Vaso R knee Low pressure, 34 deg x10 min  PATIENT EDUCATION:  Education details: HEP, POC Person educated: Patient Education method: Consulting civil engineer, Demonstration, and Handouts Education comprehension: verbalized understanding and returned demonstration   HOME EXERCISE PROGRAM: TKR basic exercises template-AP, HS, SAQ, KP, Abd, SLR, seated long kick, knee flexion  ASSESSMENT:  CLINICAL IMPRESSION: Patient reports that her pain fluctuates. Practicing sit<> stand actually helps when it hurts. Her knee ext ROM remains slightly limited due to her swelling. Strength improving as well. Finished with VASO to R knee for pain and swelling.  OBJECTIVE IMPAIRMENTS Abnormal gait, decreased activity tolerance, decreased balance, decreased coordination, decreased endurance, decreased mobility, difficulty walking, decreased ROM, decreased strength, hypomobility, increased edema, increased muscle spasms, impaired flexibility,  and pain.   ACTIVITY  LIMITATIONS bending, standing, squatting, stairs, transfers, reach over head, and locomotion level  PARTICIPATION LIMITATIONS: meal prep, cleaning, laundry, driving, shopping, and yard work  PERSONAL FACTORS Age and Past/current experiences are also affecting patient's functional outcome.   REHAB POTENTIAL: Good  CLINICAL DECISION MAKING: Evolving/moderate complexity  EVALUATION COMPLEXITY: Moderate   GOALS: Goals reviewed with patient? Yes  SHORT TERM GOALS: Target date: 05/02/2022  I with basic HEP Baseline: Goal status: met  2.  Increase R knee ROM to at least 7-95 Baseline: 11-86 Goal status: ongoing  3.  Increase R knee strength to at least 3+/5 Baseline: 3 Goal status: ongoing  LONG TERM GOALS: Target date: 06/27/2022   I with final HEP Baseline:  Goal status: ongoing  2.  Increase R knee ROM to at least 5-120 Baseline: 11-86 Goal status: ongoing  3.  Patient will climb up and down at least 14 steps with R rail going up, MI, step over step going up. Baseline: 10/16- step ups onto 4" step with SPC, 10 reps on R. Goal status: ongoing  4.  Increase r knee strength to at least 4/5 Baseline: 3 Goal status: ongoing  5.  Patient will ambulate at least 400' with LRAD on level and unlevel surfaces, MI Baseline: limited distance, RW Goal status: ongoing  6.  Increase FOTO score to at least 49 Baseline: 26 Goal status: INITIAL  7.  Complete TUG in < 12 sec with LRAD Baseline: 17.72 Goals status: ongoing   PLAN: PT FREQUENCY: 3x/week  PT DURATION: 12 weeks  PLANNED INTERVENTIONS: Therapeutic exercises, Therapeutic activity, Neuromuscular re-education, Balance training, Gait training, Patient/Family education, Self Care, Joint mobilization, Stair training, Dry Needling, Cryotherapy, Moist heat, Vasopneumatic device, Ionotophoresis 50m/ml Dexamethasone, and Manual therapy  PLAN FOR NEXT SESSION: try bike, HEP update   SEthel RanaDPT 04/29/22 1:52 PM   04/29/2022, 1:52 PM

## 2022-05-02 ENCOUNTER — Encounter: Payer: Self-pay | Admitting: Physical Therapy

## 2022-05-02 ENCOUNTER — Ambulatory Visit: Payer: 59 | Admitting: Physical Therapy

## 2022-05-02 ENCOUNTER — Encounter: Payer: Self-pay | Admitting: Neurology

## 2022-05-02 DIAGNOSIS — M25561 Pain in right knee: Secondary | ICD-10-CM

## 2022-05-02 DIAGNOSIS — R600 Localized edema: Secondary | ICD-10-CM

## 2022-05-02 DIAGNOSIS — M6281 Muscle weakness (generalized): Secondary | ICD-10-CM

## 2022-05-02 DIAGNOSIS — R262 Difficulty in walking, not elsewhere classified: Secondary | ICD-10-CM

## 2022-05-02 NOTE — Therapy (Signed)
OUTPATIENT PHYSICAL THERAPY LOWER EXTREMITY TREATMENT      Patient Name: [name] [name] MRN: [id number] DOB:[birth year], 76 y.o., female Today's Date: 05/02/2022   PT End of Session - 05/02/22 1300     Visit Number 12    Date for PT Re-Evaluation 06/27/22    PT Start Time 1300    PT Stop Time 1350    PT Time Calculation (min) 50 min    Activity Tolerance No increased pain;Patient tolerated treatment well    Behavior During Therapy [facility] for tasks assessed/performed                    Past Medical History:  Diagnosis Date   Allergic rhinitis    Anxiety    Arthritis    Cataract    Depression    Diabetes mellitus without complication (HCC)    borderline   Emphysema lung ([place])    Early stage   Endometrial polyp    Hyperlipidemia    Hyperlipidemia    on simvastatin   Hypothyroidism    IBS (irritable bowel syndrome)    Obesity    Osteoarthritis    Seasonal allergies    Sleep apnea    cpap   Past Surgical History:  Procedure Laterality Date   BUNIONECTOMY  2009   Dr.Duda, left   CARPAL TUNNEL RELEASE Right 11/28/2021   Procedure: RIGHT CARPAL TUNNEL RELEASE;  Surgeon: Sherilyn Cooter, MD;  Location: [place];  Service: Orthopedics;  Laterality: Right;   EYE SURGERY     HYSTEROSCOPY WITH D & C  04/24/2011   Procedure: DILATATION AND CURETTAGE (D&C) /HYSTEROSCOPY;  Surgeon: Eldred Manges, MD;  Location: [facility];  Service: Gynecology;  Laterality: N/A;   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   Dr Chrys Racer FASCIA SURGERY  1994   Dr Percell Miller   POLYPECTOMY  04/24/2011   Procedure: POLYPECTOMY;  Surgeon: Eldred Manges, MD;  Location: [facility];  Service: Gynecology;  Laterality: N/A;   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   Procedure: TOTAL KNEE ARTHROPLASTY;  Surgeon: Gaynelle Arabian, MD;  [facility];  Service: Orthopedics;  Laterality: Right;   Patient Active Problem List   Diagnosis Date Noted   Osteoarthritis of right  knee 04/01/2022   History of obstructive sleep apnea 02/11/2022   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 serious comorbidity with body mass index (BMI) of 37.0 to 37.9 in adult 02/11/2022   Weight loss observed on examination 02/11/2022   Pre-operative clearance 01/17/2022   Primary osteoarthritis of right knee 01/17/2022   Carpal tunnel syndrome, right upper limb 11/15/2021   Numbness 11/02/2021   OSA (obstructive sleep apnea) 07/24/2020   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/13/2020   Upper airway cough syndrome 06/13/2020   Class 3 severe obesity due to excess calories with serious comorbidity and body mass index (BMI) of 45.0 to 49.9 in adult [facility]) 06/13/2020   MCI (mild cognitive impairment) 06/13/2020   Insulin resistance 05/09/2020   Insomnia 05/09/2020   Vitamin D deficiency 02/17/2019   Diarrhea 02/17/2019   Essential hypertension 02/17/2019   Class 3 severe obesity due to excess calories with serious comorbidity and body mass index (BMI) of 40.0 to 44.9 in adult ([place]) 02/17/2019   Hair loss 02/17/2019   Anxiety 11/30/2017   Primary osteoarthritis of both knees 11/30/2017   Abnormal WBC count 11/30/2017   Obesity (BMI 30-39.9) 07/23/2013   Suspicious mole 02/01/2013   Palpitations 07/23/2012   Hyperlipidemia  01/28/2012   Morbid obesity ([place]) 04/24/2011   Preventative health care 02/20/2011   Skin lump of leg 10/10/2010   SORE THROAT 08/15/2010   LYMPHADENOPATHY 08/15/2010   TOBACCO USE 01/17/2010   Hyperlipidemia associated with type 2 diabetes mellitus ([place]) 04/05/2008   TINNITUS, CHRONIC, 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   ALLERGIC RHINITIS 01/25/2008   LIVER FUNCTION TESTS, ABNORMAL 01/25/2008   Hypothyroidism 01/13/2007   Depression with anxiety 01/13/2007   TRANSIENT GLOBAL AMNESIA 01/13/2007   FOOT SURGERY, HX OF 01/13/2007    PCP: Roma Schanz  REFERRING PROVIDER: Alluisio  REFERRING DIAG: R  TKR  THERAPY DIAG:  Acute pain of right knee  Localized  edema  Difficulty in walking, not elsewhere classified  Muscle weakness (generalized)  Rationale for Evaluation and Treatment Rehabilitation  ONSET DATE: 04/01/22  SUBJECTIVE:   SUBJECTIVE STATEMENT: "I am great today, feeling wonderful"  PERTINENT HISTORY: History of Present Illness Pt is a 76yo female presenting s/p R-TKA on 04/01/22. PMH: DM, Emphysema, HLD, Hypothyroidism, IBS, OSA on CPAP, R-carpal tunnel release 11/28/21, mild cognitive impairment  PAIN:  Are you having pain? Yes: NPRS scale: 1/10 Pain location: R knee Pain description: "hurting and annoying"  Aggravating factors: bending and getting up from a chair and toilet Relieving factors: pain medication  PRECAUTIONS: None  WEIGHT BEARING RESTRICTIONS No  FALLS:  Has patient fallen in last 6 months? No  LIVING ENVIRONMENT: Lives with: lives with their spouse Lives in: House/apartment Stairs: Yes: Internal: 14 steps; on right going up and External: 3 steps; none, has grab bar at the top Has following equipment at home: Gilford Rile - 2 wheeled, Environmental consultant - 4 wheeled, Electronics engineer, and Grab bars  OCCUPATION: retired  PLOF: Independent  PATIENT GOALS :Be able to walk with a normal gait, minimal pain.   OBJECTIVE:   PATIENT SURVEYS:  FOTO 26.11  COGNITION:  Overall cognitive status: Within functional limits for tasks assessed     SENSATION: Not tested  EDEMA:  R knee with mod edema  MUSCLE LENGTH: Hamstrings: Right deferred deg; Left 80 deg  POSTURE: rounded shoulders and flexed trunk   PALPATION: TTP around R knee.  LOWER EXTREMITY ROM: LLE ROM WFL, R hip and ankle [facility]  Active ROM Right eval Left eval 9/29 04/15/22 AROM  Hip flexion      Hip extension      Hip abduction      Hip adduction      Hip internal rotation      Hip external rotation      Knee flexion 86  100 PROM/98 AROM 106  Knee extension 11  8 AROM 6  Ankle dorsiflexion      Ankle plantarflexion      Ankle inversion       Ankle eversion       (Blank rows = not tested)  LOWER EXTREMITY MMT:  MMT Right eval Left eval  Hip flexion 3   Hip extension 3   Hip abduction    Hip adduction    Hip internal rotation    Hip external rotation    Knee flexion 3+   Knee extension 3   Ankle dorsiflexion 4-   Ankle plantarflexion    Ankle inversion    Ankle eversion     (Blank rows = not tested)   FUNCTIONAL TESTS:  5 times sit to stand: 18.69 Timed up and go (TUG): 17.72  GAIT: Distance walked: In clinic Assistive device utilized: Walker - 2 wheeled Level of assistance: SBA  Comments: Antalgic gait, leans on BUE support    TODAY'S TREATMENT: 1019/23 NuStep L5 x4 min Bike full revolutions L1 x 4 min  R knee PROM flex and ext Sit to stand holding yellow ball 2x10 RLE LAQ 3lb 2x10 RLE hamstring curls 2x10  4in step ups x10 each  Leg press 20lb 2x10  Gait 240 feet no A, some R lateral trunk lean  04/29/22 Recumbent bike, able to turn full circles, but did shift to the side to get foot around. L1 x 5 minutes Supine knee ext with heel elevated on mat. Therapist provided slight overpressure for extension. 4 x 5 sec Seated AROM for knee flex x 10 reps Seated R knee flexion against 10# resistance, 2 x 10 reps Seated knee ext against 5#, Extend with BLE, R eccentric quads to return to start, 2 x 10 reps  04/26/22 Recumbent bike unable to fully turn the pedals, moved forward and back x 4 minutes. SAQ with 3# x 10 x 5 sec holds. Gentle AAROM for R knee ext SLR with 3# x 10 reps Self knee flexion ROM-105 degrees Seated knee flexion, 20#, BLE 10 reps Knee ext 5#, 10 reps, BLE VASO x 15 minutes Low, 38  04/24/22 NuStep L4 x 6 min  R knee PROM with end range holds  S2S on airex 2x10  Resisted gait 30lb 4 way x 3 each   Leg curls, 20lb 2x10  Vaso R knee 10 min, low, 34 deg  04/22/22 NuStep L5 6 minutes STM to incision at distal points as it was bound down P and AROM of R knee flexion,  Leg  curls, 20# x 10, 10 reps 5#, RLE only. Knee ext x 10 reps, 5# with BLE, eccentric knee ext on R 5#, 10 reps. Side to side step with G tband resistance at knees, x 10 reps  04/19/22 NuStep  4in step up 2x5 each  Resisted gait 20lb 4 way x 3 each   R knee PROM with end range holds RLE quad sets with over pressure working on extension LAQ RLE 2lb 2x10 RLE HS curls green 2x10 S2S on airex 2x10 Leg press 20lb 2x10  04/17/22 NuStep L5 x 6 minutes Gentle patellar mobs and STM to R ITB Supine knee ext with heel propped and over pressure, 4 x 5 seconds Supine SLR with 1#, 10 reps, repeat with ER Seated AROM and AAROM knee flexion-115 AAROM Step up, then step down on Airex with LLE, R foot remaining on pad, 10 steps each direction with SPC L and HHA on R, able to perform much of the stepping with RLE. VASO to R knee x 10 minutes 34 degrees, low pressure.  04/15/22 NuStep L5x 6 min  R knee PROM with end range holds RLE HS curls red 2x15 RLE LAQ 3lb 2x10  S2S 2x10 Vaso R knee Low pressure, 34 deg x10 min  PATIENT EDUCATION:  Education details: HEP, POC Person educated: Patient Education method: Consulting civil engineer, Demonstration, and Handouts Education comprehension: verbalized understanding and returned demonstration   HOME EXERCISE PROGRAM: TKR basic exercises template-AP, HS, SAQ, KP, Abd, SLR, seated long kick, knee flexion  ASSESSMENT:  CLINICAL IMPRESSION: Patient reports that her pain is less overall today. Some grimacing with passive R knee flexion. Difficulty reported with leg press but able to complete well. Strength improving as well. Finished with VASO to R knee for pain and swelling.  OBJECTIVE IMPAIRMENTS Abnormal gait, decreased activity tolerance, decreased balance, decreased coordination, decreased endurance, decreased mobility, difficulty  walking, decreased ROM, decreased strength, hypomobility, increased edema, increased muscle spasms, impaired flexibility, and pain.    ACTIVITY LIMITATIONS bending, standing, squatting, stairs, transfers, reach over head, and locomotion level  PARTICIPATION LIMITATIONS: meal prep, cleaning, laundry, driving, shopping, and yard work  PERSONAL FACTORS Age and Past/current experiences are also affecting patient's functional outcome.   REHAB POTENTIAL: Good  CLINICAL DECISION MAKING: Evolving/moderate complexity  EVALUATION COMPLEXITY: Moderate   GOALS: Goals reviewed with patient? Yes  SHORT TERM GOALS: Target date: 05/02/2022  I with basic HEP Baseline: Goal status: met  2.  Increase R knee ROM to at least 7-95 Baseline: 11-86 Goal status: ongoing  3.  Increase R knee strength to at least 3+/5 Baseline: 3 Goal status: ongoing  LONG TERM GOALS: Target date: 06/27/2022   I with final HEP Baseline:  Goal status: ongoing  2.  Increase R knee ROM to at least 5-120 Baseline: 11-86 Goal status: ongoing  3.  Patient will climb up and down at least 14 steps with R rail going up, MI, step over step going up. Baseline: 10/16- step ups onto 4" step with SPC, 10 reps on R. Goal status: ongoing  4.  Increase r knee strength to at least 4/5 Baseline: 3 Goal status: ongoing  5.  Patient will ambulate at least 400' with LRAD on level and unlevel surfaces, MI Baseline: limited distance, RW Goal status: ongoing  6.  Increase FOTO score to at least 49 Baseline: 26 Goal status: INITIAL  7.  Complete TUG in < 12 sec with LRAD Baseline: 17.72 Goals status: ongoing   PLAN: PT FREQUENCY: 3x/week  PT DURATION: 12 weeks  PLANNED INTERVENTIONS: Therapeutic exercises, Therapeutic activity, Neuromuscular re-education, Balance training, Gait training, Patient/Family education, Self Care, Joint mobilization, Stair training, Dry Needling, Cryotherapy, Moist heat, Vasopneumatic device, Ionotophoresis 60m/ml Dexamethasone, and Manual therapy  PLAN FOR NEXT SESSION: try bike, HEP update   SEthel Rana DPT 05/02/22 1:47 PM  05/02/2022, 1:47 PM

## 2022-05-06 ENCOUNTER — Ambulatory Visit: Payer: 59 | Admitting: Neurology

## 2022-05-06 ENCOUNTER — Ambulatory Visit: Payer: 59 | Admitting: Physical Therapy

## 2022-05-06 ENCOUNTER — Encounter: Payer: Self-pay | Admitting: Physical Therapy

## 2022-05-06 DIAGNOSIS — R262 Difficulty in walking, not elsewhere classified: Secondary | ICD-10-CM

## 2022-05-06 DIAGNOSIS — R278 Other lack of coordination: Secondary | ICD-10-CM

## 2022-05-06 DIAGNOSIS — M25561 Pain in right knee: Secondary | ICD-10-CM

## 2022-05-06 DIAGNOSIS — R600 Localized edema: Secondary | ICD-10-CM

## 2022-05-06 DIAGNOSIS — R2681 Unsteadiness on feet: Secondary | ICD-10-CM

## 2022-05-06 DIAGNOSIS — M6281 Muscle weakness (generalized): Secondary | ICD-10-CM

## 2022-05-06 NOTE — Therapy (Signed)
OUTPATIENT PHYSICAL THERAPY LOWER EXTREMITY TREATMENT      Patient Name: [name] [name] MRN: [id number] DOB:06/10/1946, 76 y.o., female Today's Date: 05/06/2022            Past Medical History:  Diagnosis Date   Allergic rhinitis    Anxiety    Arthritis    Cataract    Depression    Diabetes mellitus without complication ([place])    borderline   Emphysema lung ([place])    Early stage   Endometrial polyp    Hyperlipidemia    Hyperlipidemia    on simvastatin   Hypothyroidism    IBS (irritable bowel syndrome)    Obesity    Osteoarthritis    Seasonal allergies    Sleep apnea    cpap   Past Surgical History:  Procedure Laterality Date   BUNIONECTOMY  2009   Dr.Duda, left   CARPAL TUNNEL RELEASE Right 11/28/2021   Procedure: RIGHT CARPAL TUNNEL RELEASE;  Surgeon: Sherilyn Cooter, MD;  Location: [place];  Service: Orthopedics;  Laterality: Right;   EYE SURGERY     HYSTEROSCOPY WITH D & C  04/24/2011   Procedure: DILATATION AND CURETTAGE (D&C) /HYSTEROSCOPY;  Surgeon: Eldred Manges, MD;  Location: [facility];  Service: Gynecology;  Laterality: N/A;   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   Dr Chrys Racer FASCIA SURGERY  1994   Dr Percell Miller   POLYPECTOMY  04/24/2011   Procedure: POLYPECTOMY;  Surgeon: Eldred Manges, MD;  Location: [facility];  Service: Gynecology;  Laterality: N/A;   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   Procedure: TOTAL KNEE ARTHROPLASTY;  Surgeon: Gaynelle Arabian, MD;  Location: [facility];  Service: Orthopedics;  Laterality: Right;   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 with body mass index (BMI) of 37.0 to 37.9 in adult 02/11/2022   Weight loss observed on examination 02/11/2022   Pre-operative clearance 01/17/2022   Primary osteoarthritis of right knee 01/17/2022    Carpal tunnel syndrome, right upper limb 11/15/2021   Numbness 11/02/2021   OSA (obstructive sleep apnea) 07/24/2020   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/13/2020   Upper airway cough syndrome 06/13/2020   Class 3 severe obesity due to excess calories with serious comorbidity and body mass index (BMI) of 45.0 to 49.9 in adult ([place]) 06/13/2020   MCI (mild cognitive impairment) 06/13/2020   Insulin resistance 05/09/2020   Insomnia 05/09/2020   Vitamin D deficiency 02/17/2019   Diarrhea 02/17/2019   Essential hypertension 02/17/2019   Class 3 severe obesity due to excess calories with serious comorbidity and body mass index (BMI) of 40.0 to 44.9 in adult ([place]) 02/17/2019   Hair loss 02/17/2019   Anxiety 11/30/2017   Primary osteoarthritis of both knees 11/30/2017   Abnormal WBC count 11/30/2017   Obesity (BMI 30-39.9) 07/23/2013   Suspicious mole 02/01/2013   Palpitations 07/23/2012   Hyperlipidemia 01/28/2012   Morbid obesity ([place]) 04/24/2011   Preventative health care 02/20/2011   Skin lump of leg 10/10/2010   SORE THROAT 08/15/2010   LYMPHADENOPATHY 08/15/2010   TOBACCO USE 01/17/2010   Hyperlipidemia associated with type 2 diabetes mellitus ([place]) 04/05/2008   TINNITUS, CHRONIC, 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   ALLERGIC RHINITIS 01/25/2008   LIVER FUNCTION TESTS, ABNORMAL 01/25/2008   Hypothyroidism 01/13/2007   Depression with anxiety 01/13/2007   TRANSIENT  GLOBAL AMNESIA 01/13/2007   FOOT SURGERY, HX OF 01/13/2007    PCP: Roma Schanz  REFERRING PROVIDER: Alluisio  REFERRING DIAG: R  TKR  THERAPY DIAG:  No diagnosis found.  Rationale for Evaluation and Treatment Rehabilitation  ONSET DATE: 04/01/22  SUBJECTIVE:   SUBJECTIVE STATEMENT: Patient reports decreased [pain in R knee. She has started massaging it, careful to not stress the scar.  PERTINENT HISTORY: History of Present Illness Pt is a 76yo female presenting s/p R-TKA on 04/01/22. PMH: DM, Emphysema, HLD,  Hypothyroidism, IBS, OSA on CPAP, R-carpal tunnel release 11/28/21, mild cognitive impairment  PAIN:  Are you having pain? Yes: NPRS scale: 1/10 Pain location: R knee Pain description: "hurting and annoying"  Aggravating factors: bending and getting up from a chair and toilet Relieving factors: pain medication  PRECAUTIONS: None  WEIGHT BEARING RESTRICTIONS No  FALLS:  Has patient fallen in last 6 months? No  LIVING ENVIRONMENT: Lives with: lives with their spouse Lives in: House/apartment Stairs: Yes: Internal: 14 steps; on right going up and External: 3 steps; none, has grab bar at the top Has following equipment at home: Gilford Rile - 2 wheeled, Environmental consultant - 4 wheeled, Electronics engineer, and Grab bars  OCCUPATION: retired  PLOF: Independent  PATIENT GOALS :Be able to walk with a normal gait, minimal pain.   OBJECTIVE:   PATIENT SURVEYS:  FOTO 26.11  COGNITION:  Overall cognitive status: Within functional limits for tasks assessed     SENSATION: Not tested  EDEMA:  R knee with mod edema  MUSCLE LENGTH: Hamstrings: Right deferred deg; Left 80 deg  POSTURE: rounded shoulders and flexed trunk   PALPATION: TTP around R knee.  LOWER EXTREMITY ROM: LLE ROM WFL, R hip and ankle [facility]  Active ROM Right eval Left eval 9/29 04/15/22 AROM  Hip flexion      Hip extension      Hip abduction      Hip adduction      Hip internal rotation      Hip external rotation      Knee flexion 86  100 PROM/98 AROM 106  Knee extension 11  8 AROM 6  Ankle dorsiflexion      Ankle plantarflexion      Ankle inversion      Ankle eversion       (Blank rows = not tested)  LOWER EXTREMITY MMT:  MMT Right eval Left eval  Hip flexion 3   Hip extension 3   Hip abduction    Hip adduction    Hip internal rotation    Hip external rotation    Knee flexion 3+   Knee extension 3   Ankle dorsiflexion 4-   Ankle plantarflexion    Ankle inversion    Ankle eversion     (Blank rows = not  tested)   FUNCTIONAL TESTS:  5 times sit to stand: 18.69 Timed up and go (TUG): 17.72  GAIT: Distance walked: In clinic Assistive device utilized: Walker - 2 wheeled Level of assistance: SBA Comments: Antalgic gait, leans on BUE support    TODAY'S TREATMENT: 05/06/22 Recumbent bike L2 x 6 minutes- reported some pain in L knee. Supine knee press with heel elevated, light overpressure, x 4 R knee AAROM 5-116 Seated R knee flexion 15#, 2 x 10 reps Seated knee ext 5#, up with 2, down with RLE only Runners stride onto Airex pad x 20 with RLE, UUE support and CGA Lateral lunge with push back to R, 2  x 10 reps Standing knee flex with 2# resistance on R x 15 reps VASO 34, low pressure, 10 min, R knee.   1019/23 NuStep L5 x4 min Bike full revolutions L1 x 4 min  R knee PROM flex and ext Sit to stand holding yellow ball 2x10 RLE LAQ 3lb 2x10 RLE hamstring curls 2x10  4in step ups x10 each  Leg press 20lb 2x10  Gait 240 feet no A, some R lateral trunk lean  04/29/22 Recumbent bike, able to turn full circles, but did shift to the side to get foot around. L1 x 5 minutes Supine knee ext with heel elevated on mat. Therapist provided slight overpressure for extension. 4 x 5 sec Seated AROM for knee flex x 10 reps Seated R knee flexion against 10# resistance, 2 x 10 reps Seated knee ext against 5#, Extend with BLE, R eccentric quads to return to start, 2 x 10 reps  04/26/22 Recumbent bike unable to fully turn the pedals, moved forward and back x 4 minutes. SAQ with 3# x 10 x 5 sec holds. Gentle AAROM for R knee ext SLR with 3# x 10 reps Self knee flexion ROM-105 degrees Seated knee flexion, 20#, BLE 10 reps Knee ext 5#, 10 reps, BLE VASO x 15 minutes Low, 38  04/24/22 NuStep L4 x 6 min  R knee PROM with end range holds  S2S on airex 2x10  Resisted gait 30lb 4 way x 3 each   Leg curls, 20lb 2x10  Vaso R knee 10 min, low, 34 deg  04/22/22 NuStep L5 6 minutes STM to  incision at distal points as it was bound down P and AROM of R knee flexion,  Leg curls, 20# x 10, 10 reps 5#, RLE only. Knee ext x 10 reps, 5# with BLE, eccentric knee ext on R 5#, 10 reps. Side to side step with G tband resistance at knees, x 10 reps  04/19/22 NuStep  4in step up 2x5 each  Resisted gait 20lb 4 way x 3 each   R knee PROM with end range holds RLE quad sets with over pressure working on extension LAQ RLE 2lb 2x10 RLE HS curls green 2x10 S2S on airex 2x10 Leg press 20lb 2x10  04/17/22 NuStep L5 x 6 minutes Gentle patellar mobs and STM to R ITB Supine knee ext with heel propped and over pressure, 4 x 5 seconds Supine SLR with 1#, 10 reps, repeat with ER Seated AROM and AAROM knee flexion-115 AAROM Step up, then step down on Airex with LLE, R foot remaining on pad, 10 steps each direction with SPC L and HHA on R, able to perform much of the stepping with RLE. VASO to R knee x 10 minutes 34 degrees, low pressure.  04/15/22 NuStep L5x 6 min  R knee PROM with end range holds RLE HS curls red 2x15 RLE LAQ 3lb 2x10  S2S 2x10 Vaso R knee Low pressure, 34 deg x10 min  PATIENT EDUCATION:  Education details: HEP, POC Person educated: Patient Education method: Consulting civil engineer, Demonstration, and Handouts Education comprehension: verbalized understanding and returned demonstration   HOME EXERCISE PROGRAM: TKR basic exercises template-AP, HS, SAQ, KP, Abd, SLR, seated long kick, knee flexion  ASSESSMENT:  CLINICAL IMPRESSION: Patient reports that her knee pain is imporved. Her ROM is improving as is her strength and stability. Progressed exercises in treatment in order to challenge her.  OBJECTIVE IMPAIRMENTS Abnormal gait, decreased activity tolerance, decreased balance, decreased coordination, decreased endurance, decreased mobility, difficulty walking,  decreased ROM, decreased strength, hypomobility, increased edema, increased muscle spasms, impaired flexibility, and  pain.   ACTIVITY LIMITATIONS bending, standing, squatting, stairs, transfers, reach over head, and locomotion level  PARTICIPATION LIMITATIONS: meal prep, cleaning, laundry, driving, shopping, and yard work  PERSONAL FACTORS Age and Past/current experiences are also affecting patient's functional outcome.   REHAB POTENTIAL: Good  CLINICAL DECISION MAKING: Evolving/moderate complexity  EVALUATION COMPLEXITY: Moderate   GOALS: Goals reviewed with patient? Yes  SHORT TERM GOALS: Target date: 05/02/2022  I with basic HEP Baseline: Goal status: met  2.  Increase R knee ROM to at least 7-95 Baseline: 11-86 Goal status: ongoing  3.  Increase R knee strength to at least 3+/5 Baseline: 3 Goal status: ongoing  LONG TERM GOALS: Target date: 06/27/2022   I with final HEP Baseline:  Goal status: ongoing  2.  Increase R knee ROM to at least 5-120 Baseline: 11-86 Goal status: ongoing  3.  Patient will climb up and down at least 14 steps with R rail going up, MI, step over step going up. Baseline: 10/16- step ups onto 4" step with SPC, 10 reps on R. Goal status: ongoing  4.  Increase r knee strength to at least 4/5 Baseline: 3 Goal status: ongoing  5.  Patient will ambulate at least 400' with LRAD on level and unlevel surfaces, MI Baseline: limited distance, RW Goal status: ongoing  6.  Increase FOTO score to at least 49 Baseline: 26 Goal status: INITIAL  7.  Complete TUG in < 12 sec with LRAD Baseline: 17.72 Goals status: ongoing   PLAN: PT FREQUENCY: 3x/week  PT DURATION: 12 weeks  PLANNED INTERVENTIONS: Therapeutic exercises, Therapeutic activity, Neuromuscular re-education, Balance training, Gait training, Patient/Family education, Self Care, Joint mobilization, Stair training, Dry Needling, Cryotherapy, Moist heat, Vasopneumatic device, Ionotophoresis 45m/ml Dexamethasone, and Manual therapy  PLAN FOR NEXT SESSION: try bike, HEP update   SEthel Rana DPT 05/06/22 1:16 PM  05/06/2022, 1:16 PM

## 2022-05-08 ENCOUNTER — Encounter: Payer: Self-pay | Admitting: Physical Therapy

## 2022-05-08 ENCOUNTER — Ambulatory Visit: Payer: 59 | Admitting: Physical Therapy

## 2022-05-08 DIAGNOSIS — M25561 Pain in right knee: Secondary | ICD-10-CM

## 2022-05-08 DIAGNOSIS — R262 Difficulty in walking, not elsewhere classified: Secondary | ICD-10-CM

## 2022-05-08 DIAGNOSIS — R600 Localized edema: Secondary | ICD-10-CM

## 2022-05-08 DIAGNOSIS — R2681 Unsteadiness on feet: Secondary | ICD-10-CM

## 2022-05-08 DIAGNOSIS — R278 Other lack of coordination: Secondary | ICD-10-CM

## 2022-05-08 DIAGNOSIS — M6281 Muscle weakness (generalized): Secondary | ICD-10-CM

## 2022-05-08 NOTE — Therapy (Signed)
OUTPATIENT PHYSICAL THERAPY LOWER EXTREMITY TREATMENT      Patient Name: [name] [name] MRN: [id number] DOB:12-15-1945, 76 y.o., female Today's Date: 05/08/2022   PT End of Session - 05/08/22 1315     Visit Number 14    Date for PT Re-Evaluation 06/27/22    PT Start Time 1310    PT Stop Time 1350    PT Time Calculation (min) 40 min    Activity Tolerance No increased pain;Patient tolerated treatment well    Behavior During Therapy [facility] for tasks assessed/performed                     Past Medical History:  Diagnosis Date   Allergic rhinitis    Anxiety    Arthritis    Cataract    Depression    Diabetes mellitus without complication ([facility])    borderline   Emphysema lung ([place])    Early stage   Endometrial polyp    Hyperlipidemia    Hyperlipidemia    on simvastatin   Hypothyroidism    IBS (irritable bowel syndrome)    Obesity    Osteoarthritis    Seasonal allergies    Sleep apnea    cpap   Past Surgical History:  Procedure Laterality Date   BUNIONECTOMY  2009   Dr.Duda, left   CARPAL TUNNEL RELEASE Right 11/28/2021   Procedure: RIGHT CARPAL TUNNEL RELEASE;  Surgeon: Sherilyn Cooter, MD;  Location: [place];  Service: Orthopedics;  Laterality: Right;   EYE SURGERY     HYSTEROSCOPY WITH D & C  04/24/2011   Procedure: DILATATION AND CURETTAGE (D&C) /HYSTEROSCOPY;  Surgeon: Eldred Manges, MD;  Location: [facility];  Service: Gynecology;  Laterality: N/A;   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   Dr Chrys Racer FASCIA SURGERY  1994   Dr Percell Miller   POLYPECTOMY  04/24/2011   Procedure: POLYPECTOMY;  Surgeon: Eldred Manges, MD;  Location: [facility];  Service: Gynecology;  Laterality: N/A;   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   Procedure: TOTAL KNEE ARTHROPLASTY;  Surgeon: Gaynelle Arabian, MD;  Location: [facility];  Service: Orthopedics;  Laterality: Right;   Patient Active Problem List   Diagnosis Date Noted   Osteoarthritis of right  knee 04/01/2022   History of obstructive sleep apnea 02/11/2022   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 serious comorbidity with body mass index (BMI) of 37.0 to 37.9 in adult 02/11/2022   Weight loss observed on examination 02/11/2022   Pre-operative clearance 01/17/2022   Primary osteoarthritis of right knee 01/17/2022   Carpal tunnel syndrome, right upper limb 11/15/2021   Numbness 11/02/2021   OSA (obstructive sleep apnea) 07/24/2020   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/13/2020   Upper airway cough syndrome 06/13/2020   Class 3 severe obesity due to excess calories with serious comorbidity and body mass index (BMI) of 45.0 to 49.9 in adult [facility]) 06/13/2020   MCI (mild cognitive impairment) 06/13/2020   Insulin resistance 05/09/2020   Insomnia 05/09/2020   Vitamin D deficiency 02/17/2019   Diarrhea 02/17/2019   Essential hypertension 02/17/2019   Class 3 severe obesity due to excess calories with serious comorbidity and body mass index (BMI) of 40.0 to 44.9 in adult ([place]) 02/17/2019   Hair loss 02/17/2019   Anxiety 11/30/2017   Primary osteoarthritis of both knees 11/30/2017   Abnormal WBC count 11/30/2017   Obesity (BMI 30-39.9) 07/23/2013   Suspicious mole 02/01/2013   Palpitations 07/23/2012  Hyperlipidemia 01/28/2012   Morbid obesity ([place]) 04/24/2011   Preventative health care 02/20/2011   Skin lump of leg 10/10/2010   SORE THROAT 08/15/2010   LYMPHADENOPATHY 08/15/2010   TOBACCO USE 01/17/2010   Hyperlipidemia associated with type 2 diabetes mellitus ([place]) 04/05/2008   TINNITUS, CHRONIC, 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   ALLERGIC RHINITIS 01/25/2008   LIVER FUNCTION TESTS, ABNORMAL 01/25/2008   Hypothyroidism 01/13/2007   Depression with anxiety 01/13/2007   TRANSIENT GLOBAL AMNESIA 01/13/2007   FOOT SURGERY, HX OF 01/13/2007    PCP: Roma Schanz  REFERRING PROVIDER: Alluisio  REFERRING DIAG: R  TKR  THERAPY DIAG:  Acute pain of right knee  Localized  edema  Difficulty in walking, not elsewhere classified  Muscle weakness (generalized)  Other lack of coordination  Unsteadiness on feet  Rationale for Evaluation and Treatment Rehabilitation  ONSET DATE: 04/01/22  SUBJECTIVE:   SUBJECTIVE STATEMENT: Patient reports that her knee was extra sore after last treatment.   PERTINENT HISTORY: History of Present Illness Pt is a 76yo female presenting s/p R-TKA on 04/01/22. PMH: DM, Emphysema, HLD, Hypothyroidism, IBS, OSA on CPAP, R-carpal tunnel release 11/28/21, mild cognitive impairment  PAIN:  Are you having pain? Yes: NPRS scale: 1/10 Pain location: R knee Pain description: "hurting and annoying"  Aggravating factors: bending and getting up from a chair and toilet Relieving factors: pain medication  PRECAUTIONS: None  WEIGHT BEARING RESTRICTIONS No  FALLS:  Has patient fallen in last 6 months? No  LIVING ENVIRONMENT: Lives with: lives with their spouse Lives in: House/apartment Stairs: Yes: Internal: 14 steps; on right going up and External: 3 steps; none, has grab bar at the top Has following equipment at home: Gilford Rile - 2 wheeled, Environmental consultant - 4 wheeled, Electronics engineer, and Grab bars  OCCUPATION: retired  PLOF: Independent  PATIENT GOALS :Be able to walk with a normal gait, minimal pain.   OBJECTIVE:   PATIENT SURVEYS:  FOTO 26.11  COGNITION:  Overall cognitive status: Within functional limits for tasks assessed     SENSATION: Not tested  EDEMA:  R knee with mod edema  MUSCLE LENGTH: Hamstrings: Right deferred deg; Left 80 deg  POSTURE: rounded shoulders and flexed trunk   PALPATION: TTP around R knee.  LOWER EXTREMITY ROM: LLE ROM WFL, R hip and ankle [facility]  Active ROM Right eval Left eval 9/29 04/15/22 AROM  Hip flexion      Hip extension      Hip abduction      Hip adduction      Hip internal rotation      Hip external rotation      Knee flexion 86  100 PROM/98 AROM 106  Knee extension 11  8  AROM 6  Ankle dorsiflexion      Ankle plantarflexion      Ankle inversion      Ankle eversion       (Blank rows = not tested)  LOWER EXTREMITY MMT:  MMT Right eval Left eval  Hip flexion 3   Hip extension 3   Hip abduction    Hip adduction    Hip internal rotation    Hip external rotation    Knee flexion 3+   Knee extension 3   Ankle dorsiflexion 4-   Ankle plantarflexion    Ankle inversion    Ankle eversion     (Blank rows = not tested)   FUNCTIONAL TESTS:  5 times sit to stand: 18.69 Timed up and go (TUG): 17.72  GAIT: Distance walked: In clinic Assistive device utilized: Walker - 2 wheeled Level of assistance: SBA Comments: Antalgic gait, leans on BUE support    TODAY'S TREATMENT: 05/08/22 NuStep L5 x 6 minutes Supine knee press with heel elevated, 10 x 5 sec.-reported the same pain as last treatment med and lateral/distal to patella. No excessive swelling or redness noted. Seated knee flexion against G tband, 2 x 10 reps Knee ext against Red Tband, 2 x 10 reps. B side stepping on airex plank, x 5 in each direction. Vaso to R knee, med pressure, 34 degrees, 10 minutes  05/06/22 Recumbent bike L2 x 6 minutes- reported some pain in L knee. Supine knee press with heel elevated, light overpressure, x 4 R knee AAROM 5-116 Seated R knee flexion 15#, 2 x 10 reps Seated knee ext 5#, up with 2, down with RLE only Runners stride onto Airex pad x 20 with RLE, UUE support and CGA Lateral lunge with push back to R, 2 x 10 reps Standing knee flex with 2# resistance on R x 15 reps VASO 34, low pressure, 10 min, R knee.   1019/23 NuStep L5 x4 min Bike full revolutions L1 x 4 min  R knee PROM flex and ext Sit to stand holding yellow ball 2x10 RLE LAQ 3lb 2x10 RLE hamstring curls 2x10  4in step ups x10 each  Leg press 20lb 2x10  Gait 240 feet no A, some R lateral trunk lean  04/29/22 Recumbent bike, able to turn full circles, but did shift to the side to get  foot around. L1 x 5 minutes Supine knee ext with heel elevated on mat. Therapist provided slight overpressure for extension. 4 x 5 sec Seated AROM for knee flex x 10 reps Seated R knee flexion against 10# resistance, 2 x 10 reps Seated knee ext against 5#, Extend with BLE, R eccentric quads to return to start, 2 x 10 reps  04/26/22 Recumbent bike unable to fully turn the pedals, moved forward and back x 4 minutes. SAQ with 3# x 10 x 5 sec holds. Gentle AAROM for R knee ext SLR with 3# x 10 reps Self knee flexion ROM-105 degrees Seated knee flexion, 20#, BLE 10 reps Knee ext 5#, 10 reps, BLE VASO x 15 minutes Low, 38  04/24/22 NuStep L4 x 6 min  R knee PROM with end range holds  S2S on airex 2x10  Resisted gait 30lb 4 way x 3 each   Leg curls, 20lb 2x10  Vaso R knee 10 min, low, 34 deg  04/22/22 NuStep L5 6 minutes STM to incision at distal points as it was bound down P and AROM of R knee flexion,  Leg curls, 20# x 10, 10 reps 5#, RLE only. Knee ext x 10 reps, 5# with BLE, eccentric knee ext on R 5#, 10 reps. Side to side step with G tband resistance at knees, x 10 reps  04/19/22 NuStep  4in step up 2x5 each  Resisted gait 20lb 4 way x 3 each   R knee PROM with end range holds RLE quad sets with over pressure working on extension LAQ RLE 2lb 2x10 RLE HS curls green 2x10 S2S on airex 2x10 Leg press 20lb 2x10  04/17/22 NuStep L5 x 6 minutes Gentle patellar mobs and STM to R ITB Supine knee ext with heel propped and over pressure, 4 x 5 seconds Supine SLR with 1#, 10 reps, repeat with ER Seated AROM and AAROM knee flexion-115 AAROM Step up, then  step down on Airex with LLE, R foot remaining on pad, 10 steps each direction with SPC L and HHA on R, able to perform much of the stepping with RLE. VASO to R knee x 10 minutes 34 degrees, low pressure.  04/15/22 NuStep L5x 6 min  R knee PROM with end range holds RLE HS curls red 2x15 RLE LAQ 3lb 2x10  S2S 2x10 Vaso R knee  Low pressure, 34 deg x10 min  PATIENT EDUCATION:  Education details: HEP, POC Person educated: Patient Education method: Consulting civil engineer, Demonstration, and Handouts Education comprehension: verbalized understanding and returned demonstration   HOME EXERCISE PROGRAM: TKR basic exercises template-AP, HS, SAQ, KP, Abd, SLR, seated long kick, knee flexion  ASSESSMENT:  CLINICAL IMPRESSION: Patient reports that her knee hurt after last treatment. She does not appear to have any swelling or discoloration in the area. She tolerated all treatment activities today, only reporting pain with knee ext in supine. She is returning to her Dr office tomorrow and will ask about the pain.  OBJECTIVE IMPAIRMENTS Abnormal gait, decreased activity tolerance, decreased balance, decreased coordination, decreased endurance, decreased mobility, difficulty walking, decreased ROM, decreased strength, hypomobility, increased edema, increased muscle spasms, impaired flexibility, and pain.   ACTIVITY LIMITATIONS bending, standing, squatting, stairs, transfers, reach over head, and locomotion level  PARTICIPATION LIMITATIONS: meal prep, cleaning, laundry, driving, shopping, and yard work  PERSONAL FACTORS Age and Past/current experiences are also affecting patient's functional outcome.   REHAB POTENTIAL: Good  CLINICAL DECISION MAKING: Evolving/moderate complexity  EVALUATION COMPLEXITY: Moderate   GOALS: Goals reviewed with patient? Yes  SHORT TERM GOALS: Target date: 05/02/2022  I with basic HEP Baseline: Goal status: met  2.  Increase R knee ROM to at least 7-95 Baseline: 11-86 Goal status: ongoing  3.  Increase R knee strength to at least 3+/5 Baseline: 3 Goal status: ongoing  LONG TERM GOALS: Target date: 06/27/2022   I with final HEP Baseline:  Goal status: ongoing  2.  Increase R knee ROM to at least 5-120 Baseline: 11-86 Goal status: ongoing  3.  Patient will climb up and down at  least 14 steps with R rail going up, MI, step over step going up. Baseline: 10/16- step ups onto 4" step with SPC, 10 reps on R. Goal status: ongoing  4.  Increase r knee strength to at least 4/5 Baseline: 3 Goal status: ongoing  5.  Patient will ambulate at least 400' with LRAD on level and unlevel surfaces, MI Baseline: limited distance, RW Goal status: ongoing  6.  Increase FOTO score to at least 49 Baseline: 26 Goal status: INITIAL  7.  Complete TUG in < 12 sec with LRAD Baseline: 17.72 Goals status: ongoing   PLAN: PT FREQUENCY: 3x/week  PT DURATION: 12 weeks  PLANNED INTERVENTIONS: Therapeutic exercises, Therapeutic activity, Neuromuscular re-education, Balance training, Gait training, Patient/Family education, Self Care, Joint mobilization, Stair training, Dry Needling, Cryotherapy, Moist heat, Vasopneumatic device, Ionotophoresis 21m/ml Dexamethasone, and Manual therapy  PLAN FOR NEXT SESSION: try bike, HEP update   SEthel RanaDPT 05/08/22 1:41 PM  05/08/2022, 1:41 PM

## 2022-05-13 ENCOUNTER — Encounter: Payer: Self-pay | Admitting: Physical Therapy

## 2022-05-13 ENCOUNTER — Ambulatory Visit: Payer: 59 | Admitting: Physical Therapy

## 2022-05-13 DIAGNOSIS — M6281 Muscle weakness (generalized): Secondary | ICD-10-CM

## 2022-05-13 DIAGNOSIS — R262 Difficulty in walking, not elsewhere classified: Secondary | ICD-10-CM

## 2022-05-13 DIAGNOSIS — M25561 Pain in right knee: Secondary | ICD-10-CM

## 2022-05-13 DIAGNOSIS — R600 Localized edema: Secondary | ICD-10-CM

## 2022-05-13 NOTE — Therapy (Signed)
OUTPATIENT PHYSICAL THERAPY LOWER EXTREMITY TREATMENT      Patient Name: [name] [name] MRN: [id number] DOB:May 15, 1946, 76 y.o., female Today's Date: 05/13/2022   PT End of Session - 05/13/22 1301     Visit Number 15    Date for PT Re-Evaluation 06/27/22    PT Start Time 1300    PT Stop Time 4098    PT Time Calculation (min) 45 min    Activity Tolerance No increased pain;Patient tolerated treatment well    Behavior During Therapy [facility] for tasks assessed/performed                     Past Medical History:  Diagnosis Date   Allergic rhinitis    Anxiety    Arthritis    Cataract    Depression    Diabetes mellitus without complication (HCC)    borderline   Emphysema lung ([place])    Early stage   Endometrial polyp    Hyperlipidemia    Hyperlipidemia    on simvastatin   Hypothyroidism    IBS (irritable bowel syndrome)    Obesity    Osteoarthritis    Seasonal allergies    Sleep apnea    cpap   Past Surgical History:  Procedure Laterality Date   BUNIONECTOMY  2009   Dr.Duda, left   CARPAL TUNNEL RELEASE Right 11/28/2021   Procedure: RIGHT CARPAL TUNNEL RELEASE;  Surgeon: Sherilyn Cooter, MD;  Location: [place];  Service: Orthopedics;  Laterality: Right;   EYE SURGERY     HYSTEROSCOPY WITH D & C  04/24/2011   Procedure: DILATATION AND CURETTAGE (D&C) /HYSTEROSCOPY;  Surgeon: Eldred Manges, MD;  Location: [facility];  Service: Gynecology;  Laterality: N/A;   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   Dr Chrys Racer FASCIA SURGERY  1994   Dr Percell Miller   POLYPECTOMY  04/24/2011   Procedure: POLYPECTOMY;  Surgeon: Eldred Manges, MD;  Location: [facility];  Service: Gynecology;  Laterality: N/A;   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   Procedure: TOTAL KNEE ARTHROPLASTY;  Surgeon: Gaynelle Arabian, MD;  Location: [facility];  Service: Orthopedics;  Laterality: Right;   Patient Active Problem List   Diagnosis Date Noted   Osteoarthritis of right  knee 04/01/2022   History of obstructive sleep apnea 02/11/2022   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 serious comorbidity with body mass index (BMI) of 37.0 to 37.9 in adult 02/11/2022   Weight loss observed on examination 02/11/2022   Pre-operative clearance 01/17/2022   Primary osteoarthritis of right knee 01/17/2022   Carpal tunnel syndrome, right upper limb 11/15/2021   Numbness 11/02/2021   OSA (obstructive sleep apnea) 07/24/2020   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/13/2020   Upper airway cough syndrome 06/13/2020   Class 3 severe obesity due to excess calories with serious comorbidity and body mass index (BMI) of 45.0 to 49.9 in adult [facility]) 06/13/2020   MCI (mild cognitive impairment) 06/13/2020   Insulin resistance 05/09/2020   Insomnia 05/09/2020   Vitamin D deficiency 02/17/2019   Diarrhea 02/17/2019   Essential hypertension 02/17/2019   Class 3 severe obesity due to excess calories with serious comorbidity and body mass index (BMI) of 40.0 to 44.9 in adult ([place]) 02/17/2019   Hair loss 02/17/2019   Anxiety 11/30/2017   Primary osteoarthritis of both knees 11/30/2017   Abnormal WBC count 11/30/2017   Obesity (BMI 30-39.9) 07/23/2013   Suspicious mole 02/01/2013   Palpitations 07/23/2012  Hyperlipidemia 01/28/2012   Morbid obesity ([place]) 04/24/2011   Preventative health care 02/20/2011   Skin lump of leg 10/10/2010   SORE THROAT 08/15/2010   LYMPHADENOPATHY 08/15/2010   TOBACCO USE 01/17/2010   Hyperlipidemia associated with type 2 diabetes mellitus ([place]) 04/05/2008   TINNITUS, CHRONIC, 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   ALLERGIC RHINITIS 01/25/2008   LIVER FUNCTION TESTS, ABNORMAL 01/25/2008   Hypothyroidism 01/13/2007   Depression with anxiety 01/13/2007   TRANSIENT GLOBAL AMNESIA 01/13/2007   FOOT SURGERY, HX OF 01/13/2007    PCP: Roma Schanz  REFERRING PROVIDER: [place]  REFERRING DIAG: R  TKR  THERAPY DIAG:  Acute pain of right knee  Localized  edema  Difficulty in walking, not elsewhere classified  Muscle weakness (generalized)  Rationale for Evaluation and Treatment Rehabilitation  ONSET DATE: 04/01/22  SUBJECTIVE:   SUBJECTIVE STATEMENT: "Im feeling fine, except Im tired, I cant sleep"  PERTINENT HISTORY: History of Present Illness Pt is a 76yo female presenting s/p R-TKA on 04/01/22. PMH: DM, Emphysema, HLD, Hypothyroidism, IBS, OSA on CPAP, R-carpal tunnel release 11/28/21, mild cognitive impairment  PAIN:  Are you having pain? Yes: NPRS scale: 0/10 Pain location: R knee Pain description: "hurting and annoying"  Aggravating factors: bending and getting up from a chair and toilet Relieving factors: pain medication  PRECAUTIONS: None  WEIGHT BEARING RESTRICTIONS No  FALLS:  Has patient fallen in last 6 months? No  LIVING ENVIRONMENT: Lives with: lives with their spouse Lives in: House/apartment Stairs: Yes: Internal: 14 steps; on right going up and External: 3 steps; none, has grab bar at the top Has following equipment at home: Gilford Rile - 2 wheeled, Environmental consultant - 4 wheeled, Electronics engineer, and Grab bars  OCCUPATION: retired  PLOF: Independent  PATIENT GOALS :Be able to walk with a normal gait, minimal pain.   OBJECTIVE:   PATIENT SURVEYS:  FOTO 26.11  COGNITION:  Overall cognitive status: Within functional limits for tasks assessed     SENSATION: Not tested  EDEMA:  R knee with mod edema  MUSCLE LENGTH: Hamstrings: Right deferred deg; Left 80 deg  POSTURE: rounded shoulders and flexed trunk   PALPATION: TTP around R knee.  LOWER EXTREMITY ROM: LLE ROM WFL, R hip and ankle [facility]  Active ROM Right eval Left eval 9/29 04/15/22 AROM  Hip flexion      Hip extension      Hip abduction      Hip adduction      Hip internal rotation      Hip external rotation      Knee flexion 86  100 PROM/98 AROM 106  Knee extension 11  8 AROM 6  Ankle dorsiflexion      Ankle plantarflexion      Ankle  inversion      Ankle eversion       (Blank rows = not tested)  LOWER EXTREMITY MMT:  MMT Right eval Left eval  Hip flexion 3   Hip extension 3   Hip abduction    Hip adduction    Hip internal rotation    Hip external rotation    Knee flexion 3+   Knee extension 3   Ankle dorsiflexion 4-   Ankle plantarflexion    Ankle inversion    Ankle eversion     (Blank rows = not tested)   FUNCTIONAL TESTS:  5 times sit to stand: 18.69 Timed up and go (TUG): 17.72  GAIT: Distance walked: In clinic Assistive device utilized: Gilford Rile - 2 wheeled  Level of assistance: SBA Comments: Antalgic gait, leans on BUE support    TODAY'S TREATMENT: 05/13/22 NuStep L4 x6 min  Gait to the back building. Increase time needed to negotiate curbs. One flight of stairs, step to alternating  patern with 1 rail R when descending. Alt pattern ascending . Hamstring curls 20lb 3x10 Leg Ext 5lb 2x10  Vaso to R knee, med pressure, 34 degrees, 10 minutes   05/08/22 NuStep L5 x 6 minutes Supine knee press with heel elevated, 10 x 5 sec.-reported the same pain as last treatment med and lateral/distal to patella. No excessive swelling or redness noted. Seated knee flexion against G tband, 2 x 10 reps Knee ext against Red Tband, 2 x 10 reps. B side stepping on airex plank, x 5 in each direction. Vaso to R knee, med pressure, 34 degrees, 10 minutes  05/06/22 Recumbent bike L2 x 6 minutes- reported some pain in L knee. Supine knee press with heel elevated, light overpressure, x 4 R knee AAROM 5-116 Seated R knee flexion 15#, 2 x 10 reps Seated knee ext 5#, up with 2, down with RLE only Runners stride onto Airex pad x 20 with RLE, UUE support and CGA Lateral lunge with push back to R, 2 x 10 reps Standing knee flex with 2# resistance on R x 15 reps VASO 34, low pressure, 10 min, R knee.   1019/23 NuStep L5 x4 min Bike full revolutions L1 x 4 min  R knee PROM flex and ext Sit to stand holding  yellow ball 2x10 RLE LAQ 3lb 2x10 RLE hamstring curls 2x10  4in step ups x10 each  Leg press 20lb 2x10  Gait 240 feet no A, some R lateral trunk lean  04/29/22 Recumbent bike, able to turn full circles, but did shift to the side to get foot around. L1 x 5 minutes Supine knee ext with heel elevated on mat. Therapist provided slight overpressure for extension. 4 x 5 sec Seated AROM for knee flex x 10 reps Seated R knee flexion against 10# resistance, 2 x 10 reps Seated knee ext against 5#, Extend with BLE, R eccentric quads to return to start, 2 x 10 reps  04/26/22 Recumbent bike unable to fully turn the pedals, moved forward and back x 4 minutes. SAQ with 3# x 10 x 5 sec holds. Gentle AAROM for R knee ext SLR with 3# x 10 reps Self knee flexion ROM-105 degrees Seated knee flexion, 20#, BLE 10 reps Knee ext 5#, 10 reps, BLE VASO x 15 minutes Low, 38  04/24/22 NuStep L4 x 6 min  R knee PROM with end range holds  S2S on airex 2x10  Resisted gait 30lb 4 way x 3 each   Leg curls, 20lb 2x10  Vaso R knee 10 min, low, 34 deg  04/22/22 NuStep L5 6 minutes STM to incision at distal points as it was bound down P and AROM of R knee flexion,  Leg curls, 20# x 10, 10 reps 5#, RLE only. Knee ext x 10 reps, 5# with BLE, eccentric knee ext on R 5#, 10 reps. Side to side step with G tband resistance at knees, x 10 reps  04/19/22 NuStep  4in step up 2x5 each  Resisted gait 20lb 4 way x 3 each   R knee PROM with end range holds RLE quad sets with over pressure working on extension LAQ RLE 2lb 2x10 RLE HS curls green 2x10 S2S on airex 2x10 Leg press 20lb 2x10  PATIENT  EDUCATION:  Education details: HEP, POC Person educated: Patient Education method: Consulting civil engineer, Demonstration, and Handouts Education comprehension: verbalized understanding and returned demonstration   HOME EXERCISE PROGRAM: TKR basic exercises template-AP, HS, SAQ, KP, Abd, SLR, seated long kick, knee  flexion  ASSESSMENT:  CLINICAL IMPRESSION: Patient enters feeling well. Progressing to outdoor ambulation and stair negotiation. Cue not to use UE as much when ascending stairs. Pt does turn sideways wen descending stairs, but did correct some with cues. Initial hesitation stepping up and down curbs. Cues negotiation improved after stair negotiation.   OBJECTIVE IMPAIRMENTS Abnormal gait, decreased activity tolerance, decreased balance, decreased coordination, decreased endurance, decreased mobility, difficulty walking, decreased ROM, decreased strength, hypomobility, increased edema, increased muscle spasms, impaired flexibility, and pain.   ACTIVITY LIMITATIONS bending, standing, squatting, stairs, transfers, reach over head, and locomotion level  PARTICIPATION LIMITATIONS: meal prep, cleaning, laundry, driving, shopping, and yard work  PERSONAL FACTORS Age and Past/current experiences are also affecting patient's functional outcome.   REHAB POTENTIAL: Good  CLINICAL DECISION MAKING: Evolving/moderate complexity  EVALUATION COMPLEXITY: Moderate   GOALS: Goals reviewed with patient? Yes  SHORT TERM GOALS: Target date: 05/02/2022  I with basic HEP Baseline: Goal status: met  2.  Increase R knee ROM to at least 7-95 Baseline: 11-86 Goal status: ongoing  3.  Increase R knee strength to at least 3+/5 Baseline: 3 Goal status: ongoing  LONG TERM GOALS: Target date: 06/27/2022   I with final HEP Baseline:  Goal status: ongoing  2.  Increase R knee ROM to at least 5-120 Baseline: 11-86 Goal status: ongoing  3.  Patient will climb up and down at least 14 steps with R rail going up, MI, step over step going up. Baseline: 10/16- step ups onto 4" step with SPC, 10 reps on R. Goal status: ongoing  4.  Increase r knee strength to at least 4/5 Baseline: 3 Goal status: ongoing  5.  Patient will ambulate at least 400' with LRAD on level and unlevel surfaces, MI Baseline:  limited distance, RW Goal status: ongoing  6.  Increase FOTO score to at least 49 Baseline: 26 Goal status: INITIAL  7.  Complete TUG in < 12 sec with LRAD Baseline: 17.72 Goals status: ongoing   PLAN: PT FREQUENCY: 3x/week  PT DURATION: 12 weeks  PLANNED INTERVENTIONS: Therapeutic exercises, Therapeutic activity, Neuromuscular re-education, Balance training, Gait training, Patient/Family education, Self Care, Joint mobilization, Stair training, Dry Needling, Cryotherapy, Moist heat, Vasopneumatic device, Ionotophoresis 73m/ml Dexamethasone, and Manual therapy  PLAN FOR NEXT SESSION: try bike, HEP update   SEthel RanaDPT 05/13/22 1:01 PM  05/13/2022, 1:01 PM

## 2022-05-15 ENCOUNTER — Encounter: Payer: Self-pay | Admitting: Physical Therapy

## 2022-05-15 ENCOUNTER — Ambulatory Visit: Payer: 59 | Attending: Student | Admitting: Physical Therapy

## 2022-05-15 DIAGNOSIS — R2681 Unsteadiness on feet: Secondary | ICD-10-CM | POA: Insufficient documentation

## 2022-05-15 DIAGNOSIS — R600 Localized edema: Secondary | ICD-10-CM | POA: Insufficient documentation

## 2022-05-15 DIAGNOSIS — R278 Other lack of coordination: Secondary | ICD-10-CM | POA: Diagnosis present

## 2022-05-15 DIAGNOSIS — M25561 Pain in right knee: Secondary | ICD-10-CM | POA: Diagnosis not present

## 2022-05-15 DIAGNOSIS — R262 Difficulty in walking, not elsewhere classified: Secondary | ICD-10-CM | POA: Insufficient documentation

## 2022-05-15 DIAGNOSIS — M6281 Muscle weakness (generalized): Secondary | ICD-10-CM | POA: Diagnosis present

## 2022-05-15 NOTE — Therapy (Signed)
OUTPATIENT PHYSICAL THERAPY LOWER EXTREMITY TREATMENT      Patient Name: [name] [name] MRN: [id number] DOB:January 01, 1946, 76 y.o., female Today's Date: 05/15/2022   PT End of Session - 05/15/22 1318     Visit Number 16    Date for PT Re-Evaluation 06/27/22    PT Start Time 1314    PT Stop Time 2481    PT Time Calculation (min) 40 min    Activity Tolerance No increased pain;Patient tolerated treatment well    Behavior During Therapy [facility] for tasks assessed/performed                      Past Medical History:  Diagnosis Date   Allergic rhinitis    Anxiety    Arthritis    Cataract    Depression    Diabetes mellitus without complication (HCC)    borderline   Emphysema lung ([place])    Early stage   Endometrial polyp    Hyperlipidemia    Hyperlipidemia    on simvastatin   Hypothyroidism    IBS (irritable bowel syndrome)    Obesity    Osteoarthritis    Seasonal allergies    Sleep apnea    cpap   Past Surgical History:  Procedure Laterality Date   BUNIONECTOMY  2009   Dr.Duda, left   CARPAL TUNNEL RELEASE Right 11/28/2021   Procedure: RIGHT CARPAL TUNNEL RELEASE;  Surgeon: Sherilyn Cooter, MD;  Location: [facility];  Service: Orthopedics;  Laterality: Right;   EYE SURGERY     HYSTEROSCOPY WITH D & C  04/24/2011   Procedure: DILATATION AND CURETTAGE (D&C) /HYSTEROSCOPY;  Surgeon: Eldred Manges, MD;  Location: [facility];  Service: Gynecology;  Laterality: N/A;   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   Dr Chrys Racer FASCIA SURGERY  1994   Dr Percell Miller   POLYPECTOMY  04/24/2011   Procedure: POLYPECTOMY;  Surgeon: Eldred Manges, MD;  Location: [facility];  Service: Gynecology;  Laterality: N/A;   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   Procedure: TOTAL KNEE ARTHROPLASTY;  Surgeon: Gaynelle Arabian, MD;  Location[facility];  Service: Orthopedics;  Laterality: Right;   Patient Active Problem List   Diagnosis Date Noted   Osteoarthritis of  right knee 04/01/2022   History of obstructive sleep apnea 02/11/2022   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 serious comorbidity with body mass index (BMI) of 37.0 to 37.9 in adult 02/11/2022   Weight loss observed on examination 02/11/2022   Pre-operative clearance 01/17/2022   Primary osteoarthritis of right knee 01/17/2022   Carpal tunnel syndrome, right upper limb 11/15/2021   Numbness 11/02/2021   OSA (obstructive sleep apnea) 07/24/2020   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/13/2020   Upper airway cough syndrome 06/13/2020   Class 3 severe obesity due to excess calories with serious comorbidity and body mass index (BMI) of 45.0 to 49.9 in adult [facility]) 06/13/2020   MCI (mild cognitive impairment) 06/13/2020   Insulin resistance 05/09/2020   Insomnia 05/09/2020   Vitamin D deficiency 02/17/2019   Diarrhea 02/17/2019   Essential hypertension 02/17/2019   Class 3 severe obesity due to excess calories with serious comorbidity and body mass index (BMI) of 40.0 to 44.9 in adult ([place]) 02/17/2019   Hair loss 02/17/2019   Anxiety 11/30/2017   Primary osteoarthritis of both knees 11/30/2017   Abnormal WBC count 11/30/2017   Obesity (BMI 30-39.9) 07/23/2013   Suspicious mole 02/01/2013   Palpitations 07/23/2012  Hyperlipidemia 01/28/2012   Morbid obesity ([place]) 04/24/2011   Preventative health care 02/20/2011   Skin lump of leg 10/10/2010   SORE THROAT 08/15/2010   LYMPHADENOPATHY 08/15/2010   TOBACCO USE 01/17/2010   Hyperlipidemia associated with type 2 diabetes mellitus ([place]) 04/05/2008   TINNITUS, CHRONIC, 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   ALLERGIC RHINITIS 01/25/2008   LIVER FUNCTION TESTS, ABNORMAL 01/25/2008   Hypothyroidism 01/13/2007   Depression with anxiety 01/13/2007   TRANSIENT GLOBAL AMNESIA 01/13/2007   FOOT SURGERY, HX OF 01/13/2007    PCP: Roma Schanz  REFERRING PROVIDER: [facility]  REFERRING DIAG: R  TKR  THERAPY DIAG:  Acute pain of right knee  Localized  edema  Difficulty in walking, not elsewhere classified  Muscle weakness (generalized)  Other lack of coordination  Unsteadiness on feet  Rationale for Evaluation and Treatment Rehabilitation  ONSET DATE: 04/01/22  SUBJECTIVE:   SUBJECTIVE STATEMENT: Patient reports that she gained a lot of confidence after climbing the steps during her last visit.  PERTINENT HISTORY: History of Present Illness Pt is a 76yo female presenting s/p R-TKA on 04/01/22. PMH: DM, Emphysema, HLD, Hypothyroidism, IBS, OSA on CPAP, R-carpal tunnel release 11/28/21, mild cognitive impairment  PAIN:  Are you having pain? Yes: NPRS scale: 0/10 Pain location: R knee Pain description: "hurting and annoying"  Aggravating factors: bending and getting up from a chair and toilet Relieving factors: pain medication  PRECAUTIONS: None  WEIGHT BEARING RESTRICTIONS No  FALLS:  Has patient fallen in last 6 months? No  LIVING ENVIRONMENT: Lives with: lives with their spouse Lives in: House/apartment Stairs: Yes: Internal: 14 steps; on right going up and External: 3 steps; none, has grab bar at the top Has following equipment at home: Gilford Rile - 2 wheeled, Environmental consultant - 4 wheeled, Electronics engineer, and Grab bars  OCCUPATION: retired  PLOF: Independent  PATIENT GOALS :Be able to walk with a normal gait, minimal pain.   OBJECTIVE:   PATIENT SURVEYS:  FOTO 26.11  COGNITION:  Overall cognitive status: Within functional limits for tasks assessed     SENSATION: Not tested  EDEMA:  R knee with mod edema  MUSCLE LENGTH: Hamstrings: Right deferred deg; Left 80 deg  POSTURE: rounded shoulders and flexed trunk   PALPATION: TTP around R knee.  LOWER EXTREMITY ROM: LLE ROM WFL, R hip and ankle [facility]  Active ROM Right eval Left eval 9/29 04/15/22 AROM  Hip flexion      Hip extension      Hip abduction      Hip adduction      Hip internal rotation      Hip external rotation      Knee flexion 86  100 PROM/98  AROM 106  Knee extension 11  8 AROM 6  Ankle dorsiflexion      Ankle plantarflexion      Ankle inversion      Ankle eversion       (Blank rows = not tested)  LOWER EXTREMITY MMT:  MMT Right eval Left eval  Hip flexion 3   Hip extension 3   Hip abduction    Hip adduction    Hip internal rotation    Hip external rotation    Knee flexion 3+   Knee extension 3   Ankle dorsiflexion 4-   Ankle plantarflexion    Ankle inversion    Ankle eversion     (Blank rows = not tested)   FUNCTIONAL TESTS:  5 times sit to stand: 18.69 Timed up  and go (TUG): 17.72  GAIT: Distance walked: In clinic Assistive device utilized: Walker - 2 wheeled Level of assistance: SBA Comments: Antalgic gait, leans on BUE support    TODAY'S TREATMENT: 05/15/22 NuStep L5 x 6 minutes Ambulated x 200' on unlevel surfaces, including up and down a curb, to access stairwell. Up and down 2 x 15 steps using combination of step over step and step to gait pattern going down, U handrail. Return with 200'. Knee flexion, 25# with BLE 2 x 10 reps, RLE only 2 x 10, 15# Knee ext, BLE 10#, 2 x 10 reps, %# with BLE up, RLE eccentric release, 2 x 10 reps Vaso 10 minutes, med pressure, R knee, 34 degrees   05/13/22 NuStep L4 x6 min  Gait to the back building. Increase time needed to negotiate curbs. One flight of stairs, step to alternating  patern with 1 rail R when descending. Alt pattern ascending . Hamstring curls 20lb 3x10 Leg Ext 5lb 2x10  Vaso to R knee, med pressure, 34 degrees, 10 minutes   05/08/22 NuStep L5 x 6 minutes Supine knee press with heel elevated, 10 x 5 sec.-reported the same pain as last treatment med and lateral/distal to patella. No excessive swelling or redness noted. Seated knee flexion against G tband, 2 x 10 reps Knee ext against Red Tband, 2 x 10 reps. B side stepping on airex plank, x 5 in each direction. Vaso to R knee, med pressure, 34 degrees, 10 minutes  05/06/22 Recumbent  bike L2 x 6 minutes- reported some pain in L knee. Supine knee press with heel elevated, light overpressure, x 4 R knee AAROM 5-116 Seated R knee flexion 15#, 2 x 10 reps Seated knee ext 5#, up with 2, down with RLE only Runners stride onto Airex pad x 20 with RLE, UUE support and CGA Lateral lunge with push back to R, 2 x 10 reps Standing knee flex with 2# resistance on R x 15 reps VASO 34, low pressure, 10 min, R knee.   1019/23 NuStep L5 x4 min Bike full revolutions L1 x 4 min  R knee PROM flex and ext Sit to stand holding yellow ball 2x10 RLE LAQ 3lb 2x10 RLE hamstring curls 2x10  4in step ups x10 each  Leg press 20lb 2x10  Gait 240 feet no A, some R lateral trunk lean   PATIENT EDUCATION:  Education details: HEP, POC Person educated: Patient Education method: Consulting civil engineer, Demonstration, and Handouts Education comprehension: verbalized understanding and returned demonstration   HOME EXERCISE PROGRAM: TKR basic exercises template-AP, HS, SAQ, KP, Abd, SLR, seated long kick, knee flexion  ASSESSMENT:  CLINICAL IMPRESSION: Patient reports increased confidence after practicing stairs, wanted to practice again. Also continued with strengthening for quads and HS.  OBJECTIVE IMPAIRMENTS Abnormal gait, decreased activity tolerance, decreased balance, decreased coordination, decreased endurance, decreased mobility, difficulty walking, decreased ROM, decreased strength, hypomobility, increased edema, increased muscle spasms, impaired flexibility, and pain.   ACTIVITY LIMITATIONS bending, standing, squatting, stairs, transfers, reach over head, and locomotion level  PARTICIPATION LIMITATIONS: meal prep, cleaning, laundry, driving, shopping, and yard work  PERSONAL FACTORS Age and Past/current experiences are also affecting patient's functional outcome.   REHAB POTENTIAL: Good  CLINICAL DECISION MAKING: Evolving/moderate complexity  EVALUATION COMPLEXITY:  Moderate   GOALS: Goals reviewed with patient? Yes  SHORT TERM GOALS: Target date: 05/02/2022  I with basic HEP Baseline: Goal status: met  2.  Increase R knee ROM to at least 7-95 Baseline: 11-86 Goal status: ongoing  3.  Increase R knee strength to at least 3+/5 Baseline: 3 Goal status: ongoing  LONG TERM GOALS: Target date: 06/27/2022   I with final HEP Baseline:  Goal status: ongoing  2.  Increase R knee ROM to at least 5-120 Baseline: 11-86 Goal status: ongoing  3.  Patient will climb up and down at least 14 steps with R rail going up, MI, step over step going up. Baseline: 10/16- step ups onto 4" step with SPC, 10 reps on R. Goal status: ongoing  4.  Increase r knee strength to at least 4/5 Baseline: 3 Goal status: ongoing  5.  Patient will ambulate at least 400' with LRAD on level and unlevel surfaces, MI Baseline: limited distance, RW Goal status: ongoing  6.  Increase FOTO score to at least 49 Baseline: 26 Goal status: INITIAL  7.  Complete TUG in < 12 sec with LRAD Baseline: 17.72 Goals status: ongoing   PLAN: PT FREQUENCY: 3x/week  PT DURATION: 12 weeks  PLANNED INTERVENTIONS: Therapeutic exercises, Therapeutic activity, Neuromuscular re-education, Balance training, Gait training, Patient/Family education, Self Care, Joint mobilization, Stair training, Dry Needling, Cryotherapy, Moist heat, Vasopneumatic device, Ionotophoresis 29m/ml Dexamethasone, and Manual therapy  PLAN FOR NEXT SESSION: try bike, HEP update   SEthel RanaDPT 05/15/22 2:15 PM  05/15/2022, 2:15 PM

## 2022-05-22 ENCOUNTER — Ambulatory Visit: Payer: 59 | Admitting: Physical Therapy

## 2022-05-22 DIAGNOSIS — M25561 Pain in right knee: Secondary | ICD-10-CM

## 2022-05-22 DIAGNOSIS — R600 Localized edema: Secondary | ICD-10-CM

## 2022-05-22 DIAGNOSIS — M6281 Muscle weakness (generalized): Secondary | ICD-10-CM

## 2022-05-22 DIAGNOSIS — R278 Other lack of coordination: Secondary | ICD-10-CM

## 2022-05-22 DIAGNOSIS — R2681 Unsteadiness on feet: Secondary | ICD-10-CM

## 2022-05-22 DIAGNOSIS — R262 Difficulty in walking, not elsewhere classified: Secondary | ICD-10-CM

## 2022-05-22 NOTE — Therapy (Signed)
OUTPATIENT PHYSICAL THERAPY LOWER EXTREMITY TREATMENT      Patient Name: [name] [name] MRN: [id number] DOB:12/13/1945, 76 y.o., female Today's Date: 05/22/2022   PT End of Session - 05/22/22 1107     Visit Number 17    Date for PT Re-Evaluation 06/27/22    PT Start Time 1104    PT Stop Time 1142    PT Time Calculation (min) 38 min    Activity Tolerance No increased pain;Patient tolerated treatment well    Behavior During Therapy [facility] for tasks assessed/performed                       Past Medical History:  Diagnosis Date   Allergic rhinitis    Anxiety    Arthritis    Cataract    Depression    Diabetes mellitus without complication (HCC)    borderline   Emphysema lung ([place])    Early stage   Endometrial polyp    Hyperlipidemia    Hyperlipidemia    on simvastatin   Hypothyroidism    IBS (irritable bowel syndrome)    Obesity    Osteoarthritis    Seasonal allergies    Sleep apnea    cpap   Past Surgical History:  Procedure Laterality Date   BUNIONECTOMY  2009   Dr.Duda, left   CARPAL TUNNEL RELEASE Right 11/28/2021   Procedure: RIGHT CARPAL TUNNEL RELEASE;  Surgeon: Sherilyn Cooter, MD;  Location: [place];  Service: Orthopedics;  Laterality: Right;   EYE SURGERY     HYSTEROSCOPY WITH D & C  04/24/2011   Procedure: DILATATION AND CURETTAGE (D&C) /HYSTEROSCOPY;  Surgeon: Eldred Manges, MD;  Location: [place] [facility];  Service: Gynecology;  Laterality: N/A;   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   Dr Chrys Racer FASCIA SURGERY  1994   Dr Percell Miller   POLYPECTOMY  04/24/2011   Procedure: POLYPECTOMY;  Surgeon: Eldred Manges, MD;  Location: [place] [facility];  Service: Gynecology;  Laterality: N/A;   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   Procedure: TOTAL KNEE ARTHROPLASTY;  Surgeon: Gaynelle Arabian, MD;  Location[facility];  Service: Orthopedics;  Laterality: Right;   Patient Active Problem List   Diagnosis Date Noted   Osteoarthritis of  right knee 04/01/2022   History of obstructive sleep apnea 02/11/2022   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 serious comorbidity with body mass index (BMI) of 37.0 to 37.9 in adult 02/11/2022   Weight loss observed on examination 02/11/2022   Pre-operative clearance 01/17/2022   Primary osteoarthritis of right knee 01/17/2022   Carpal tunnel syndrome, right upper limb 11/15/2021   Numbness 11/02/2021   OSA (obstructive sleep apnea) 07/24/2020   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/13/2020   Upper airway cough syndrome 06/13/2020   Class 3 severe obesity due to excess calories with serious comorbidity and body mass index (BMI) of 45.0 to 49.9 in adult [facility]) 06/13/2020   MCI (mild cognitive impairment) 06/13/2020   Insulin resistance 05/09/2020   Insomnia 05/09/2020   Vitamin D deficiency 02/17/2019   Diarrhea 02/17/2019   Essential hypertension 02/17/2019   Class 3 severe obesity due to excess calories with serious comorbidity and body mass index (BMI) of 40.0 to 44.9 in adult ([place]) 02/17/2019   Hair loss 02/17/2019   Anxiety 11/30/2017   Primary osteoarthritis of both knees 11/30/2017   Abnormal WBC count 11/30/2017   Obesity (BMI 30-39.9) 07/23/2013   Suspicious mole 02/01/2013   Palpitations 07/23/2012  Hyperlipidemia 01/28/2012   Morbid obesity ([place]) 04/24/2011   Preventative health care 02/20/2011   Skin lump of leg 10/10/2010   SORE THROAT 08/15/2010   LYMPHADENOPATHY 08/15/2010   TOBACCO USE 01/17/2010   Hyperlipidemia associated with type 2 diabetes mellitus ([place]) 04/05/2008   TINNITUS, CHRONIC, 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   ALLERGIC RHINITIS 01/25/2008   LIVER FUNCTION TESTS, ABNORMAL 01/25/2008   Hypothyroidism 01/13/2007   Depression with anxiety 01/13/2007   TRANSIENT GLOBAL AMNESIA 01/13/2007   FOOT SURGERY, HX OF 01/13/2007    PCP: Roma Schanz  REFERRING PROVIDER: Alluisio  REFERRING DIAG: R  TKR  THERAPY DIAG:  Acute pain of right knee  Localized  edema  Difficulty in walking, not elsewhere classified  Unsteadiness on feet  Other lack of coordination  Muscle weakness (generalized)  Rationale for Evaluation and Treatment Rehabilitation  ONSET DATE: 04/01/22  SUBJECTIVE:   SUBJECTIVE STATEMENT: Patient reports that her L knee pain has been increasing. Need to be cautious about stressing it. Scheduled for replacement 3/24  PERTINENT HISTORY: History of Present Illness Pt is a 76yo female presenting s/p R-TKA on 04/01/22. PMH: DM, Emphysema, HLD, Hypothyroidism, IBS, OSA on CPAP, R-carpal tunnel release 11/28/21, mild cognitive impairment  PAIN:  Are you having pain? Yes: NPRS scale: 0/10 Pain location: R knee Pain description: "hurting and annoying"  Aggravating factors: bending and getting up from a chair and toilet Relieving factors: pain medication  PRECAUTIONS: None  WEIGHT BEARING RESTRICTIONS No  FALLS:  Has patient fallen in last 6 months? No  LIVING ENVIRONMENT: Lives with: lives with their spouse Lives in: House/apartment Stairs: Yes: Internal: 14 steps; on right going up and External: 3 steps; none, has grab bar at the top Has following equipment at home: Gilford Rile - 2 wheeled, Environmental consultant - 4 wheeled, Electronics engineer, and Grab bars  OCCUPATION: retired  PLOF: Independent  PATIENT GOALS :Be able to walk with a normal gait, minimal pain.   OBJECTIVE:   PATIENT SURVEYS:  FOTO 26.11  COGNITION:  Overall cognitive status: Within functional limits for tasks assessed     SENSATION: Not tested  EDEMA:  R knee with mod edema  MUSCLE LENGTH: Hamstrings: Right deferred deg; Left 80 deg  POSTURE: rounded shoulders and flexed trunk   PALPATION: TTP around R knee.  LOWER EXTREMITY ROM: LLE ROM WFL, R hip and ankle [facility]  Active ROM Right eval Left eval 9/29 04/15/22 AROM  Hip flexion      Hip extension      Hip abduction      Hip adduction      Hip internal rotation      Hip external rotation       Knee flexion 86  100 PROM/98 AROM 106  Knee extension 11  8 AROM 6  Ankle dorsiflexion      Ankle plantarflexion      Ankle inversion      Ankle eversion       (Blank rows = not tested)  LOWER EXTREMITY MMT:  MMT Right eval Left eval  Hip flexion 3   Hip extension 3   Hip abduction    Hip adduction    Hip internal rotation    Hip external rotation    Knee flexion 3+   Knee extension 3   Ankle dorsiflexion 4-   Ankle plantarflexion    Ankle inversion    Ankle eversion     (Blank rows = not tested)   FUNCTIONAL TESTS:  5 times sit to  stand: 18.69 Timed up and go (TUG): 17.72  GAIT: Distance walked: In clinic Assistive device utilized: Walker - 2 wheeled Level of assistance: SBA Comments: Antalgic gait, leans on BUE support    TODAY'S TREATMENT: 05/22/22 Bike L3 x 6 minutes Practiced stair climbing. Patient notes that her LLE is having more difficulty in [place] activities. Problem solved how to manage steps. Step ups with RLE 10 forward and side steps on 6", the 10 crossover steps onto 4" ste. BUE support for balance, tried to not use hands to assist. B side stepping against G tband resistance just above knees 2 x 10 reps each direction. R knee flexion, 20#, 2 x 10 reps R knee ext, 4#, 2 x 10 reps Vaso, 10 min, 34, med, R  05/15/22 NuStep L5 x 6 minutes Ambulated x 200' on unlevel surfaces, including up and down a curb, to access stairwell. Up and down 2 x 15 steps using combination of step over step and step to gait pattern going down, U handrail. Return with 200'. Knee flexion, 25# with BLE 2 x 10 reps, RLE only 2 x 10, 15# Knee ext, BLE 10#, 2 x 10 reps, %# with BLE up, RLE eccentric release, 2 x 10 reps Vaso 10 minutes, med pressure, R knee, 34 degrees   05/13/22 NuStep L4 x6 min  Gait to the back building. Increase time needed to negotiate curbs. One flight of stairs, step to alternating  patern with 1 rail R when descending. Alt pattern ascending  . Hamstring curls 20lb 3x10 Leg Ext 5lb 2x10  Vaso to R knee, med pressure, 34 degrees, 10 minutes   05/08/22 NuStep L5 x 6 minutes Supine knee press with heel elevated, 10 x 5 sec.-reported the same pain as last treatment med and lateral/distal to patella. No excessive swelling or redness noted. Seated knee flexion against G tband, 2 x 10 reps Knee ext against Red Tband, 2 x 10 reps. B side stepping on airex plank, x 5 in each direction. Vaso to R knee, med pressure, 34 degrees, 10 minutes  05/06/22 Recumbent bike L2 x 6 minutes- reported some pain in L knee. Supine knee press with heel elevated, light overpressure, x 4 R knee AAROM 5-116 Seated R knee flexion 15#, 2 x 10 reps Seated knee ext 5#, up with 2, down with RLE only Runners stride onto Airex pad x 20 with RLE, UUE support and CGA Lateral lunge with push back to R, 2 x 10 reps Standing knee flex with 2# resistance on R x 15 reps VASO 34, low pressure, 10 min, R knee.   1019/23 NuStep L5 x4 min Bike full revolutions L1 x 4 min  R knee PROM flex and ext Sit to stand holding yellow ball 2x10 RLE LAQ 3lb 2x10 RLE hamstring curls 2x10  4in step ups x10 each  Leg press 20lb 2x10  Gait 240 feet no A, some R lateral trunk lean   PATIENT EDUCATION:  Education details: HEP, POC Person educated: Patient Education method: Consulting civil engineer, Demonstration, and Handouts Education comprehension: verbalized understanding and returned demonstration   HOME EXERCISE PROGRAM: TKR basic exercises template-AP, HS, SAQ, KP, Abd, SLR, seated long kick, knee flexion  ASSESSMENT:  CLINICAL IMPRESSION: Patient reports increased confidence after practicing stairs, wanted to practice again. Also continued with strengthening for quads and HS.  OBJECTIVE IMPAIRMENTS Abnormal gait, decreased activity tolerance, decreased balance, decreased coordination, decreased endurance, decreased mobility, difficulty walking, decreased ROM, decreased  strength, hypomobility, increased edema, increased muscle spasms,  impaired flexibility, and pain.   ACTIVITY LIMITATIONS bending, standing, squatting, stairs, transfers, reach over head, and locomotion level  PARTICIPATION LIMITATIONS: meal prep, cleaning, laundry, driving, shopping, and yard work  PERSONAL FACTORS Age and Past/current experiences are also affecting patient's functional outcome.   REHAB POTENTIAL: Good  CLINICAL DECISION MAKING: Evolving/moderate complexity  EVALUATION COMPLEXITY: Moderate   GOALS: Goals reviewed with patient? Yes  SHORT TERM GOALS: Target date: 05/02/2022  I with basic HEP Baseline: Goal status: met  2.  Increase R knee ROM to at least 7-95 Baseline: 11-86 Goal status: ongoing  3.  Increase R knee strength to at least 3+/5 Baseline: 3 Goal status: ongoing  LONG TERM GOALS: Target date: 06/27/2022   I with final HEP Baseline:  Goal status: ongoing  2.  Increase R knee ROM to at least 5-120 Baseline: 11-86 Goal status: ongoing  3.  Patient will climb up and down at least 14 steps with R rail going up, MI, step over step going up. Baseline: 10/16- step ups onto 4" step with SPC, 10 reps on R. Goal status: ongoing  4.  Increase r knee strength to at least 4/5 Baseline: 3 Goal status: ongoing  5.  Patient will ambulate at least 400' with LRAD on level and unlevel surfaces, MI Baseline: limited distance, RW Goal status: ongoing  6.  Increase FOTO score to at least 49 Baseline: 26 Goal status: INITIAL  7.  Complete TUG in < 12 sec with LRAD Baseline: 17.72 Goals status: ongoing   PLAN: PT FREQUENCY: 3x/week  PT DURATION: 12 weeks  PLANNED INTERVENTIONS: Therapeutic exercises, Therapeutic activity, Neuromuscular re-education, Balance training, Gait training, Patient/Family education, Self Care, Joint mobilization, Stair training, Dry Needling, Cryotherapy, Moist heat, Vasopneumatic device, Ionotophoresis 32m/ml  Dexamethasone, and Manual therapy  PLAN FOR NEXT SESSION: try bike, HEP update   SEthel RanaDPT 05/22/22 11:48 AM  05/22/2022, 11:48 AM

## 2022-05-24 ENCOUNTER — Other Ambulatory Visit (HOSPITAL_BASED_OUTPATIENT_CLINIC_OR_DEPARTMENT_OTHER): Payer: Self-pay

## 2022-05-24 MED ORDER — FLUAD QUADRIVALENT 0.5 ML IM PRSY
PREFILLED_SYRINGE | INTRAMUSCULAR | 0 refills | Status: DC
  Filled 2022-05-24: qty 0.5, 1d supply, fill #0

## 2022-05-24 MED ORDER — COMIRNATY 30 MCG/0.3ML IM SUSY
PREFILLED_SYRINGE | INTRAMUSCULAR | 0 refills | Status: DC
  Filled 2022-05-24: qty 0.3, 1d supply, fill #0

## 2022-05-27 ENCOUNTER — Ambulatory Visit: Payer: 59 | Admitting: Physical Therapy

## 2022-05-27 ENCOUNTER — Encounter: Payer: Self-pay | Admitting: Physical Therapy

## 2022-05-27 DIAGNOSIS — M6281 Muscle weakness (generalized): Secondary | ICD-10-CM

## 2022-05-27 DIAGNOSIS — R262 Difficulty in walking, not elsewhere classified: Secondary | ICD-10-CM

## 2022-05-27 DIAGNOSIS — R600 Localized edema: Secondary | ICD-10-CM

## 2022-05-27 DIAGNOSIS — M25561 Pain in right knee: Secondary | ICD-10-CM

## 2022-05-27 DIAGNOSIS — R2681 Unsteadiness on feet: Secondary | ICD-10-CM

## 2022-05-27 NOTE — Therapy (Signed)
OUTPATIENT PHYSICAL THERAPY LOWER EXTREMITY TREATMENT      Patient Name: [name] [name] MRN: [id number] DOB:Apr 17, 1946, 76 y.o., female Today's Date: 05/27/2022   PT End of Session - 05/27/22 1437     Visit Number 18    Date for PT Re-Evaluation 06/27/22    PT Start Time 1432    PT Stop Time 1515    PT Time Calculation (min) 43 min    Activity Tolerance No increased pain;Patient tolerated treatment well    Behavior During Therapy [facility] for tasks assessed/performed                       Past Medical History:  Diagnosis Date   Allergic rhinitis    Anxiety    Arthritis    Cataract    Depression    Diabetes mellitus without complication (HCC)    borderline   Emphysema lung ([place])    Early stage   Endometrial polyp    Hyperlipidemia    Hyperlipidemia    on simvastatin   Hypothyroidism    IBS (irritable bowel syndrome)    Obesity    Osteoarthritis    Seasonal allergies    Sleep apnea    cpap   Past Surgical History:  Procedure Laterality Date   BUNIONECTOMY  2009   Dr.Duda, left   CARPAL TUNNEL RELEASE Right 11/28/2021   Procedure: RIGHT CARPAL TUNNEL RELEASE;  Surgeon: Sherilyn Cooter, MD;  Location: [place];  Service: Orthopedics;  Laterality: Right;   EYE SURGERY     HYSTEROSCOPY WITH D & C  04/24/2011   Procedure: DILATATION AND CURETTAGE (D&C) /HYSTEROSCOPY;  Surgeon: Eldred Manges, MD;  Location: [place];  Service: Gynecology;  Laterality: N/A;   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   Dr Chrys Racer FASCIA SURGERY  1994   Dr Percell Miller   POLYPECTOMY  04/24/2011   Procedure: POLYPECTOMY;  Surgeon: Eldred Manges, MD;  Location: [place] [facility];  Service: Gynecology;  Laterality: N/A;   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   Procedure: TOTAL KNEE ARTHROPLASTY;  Surgeon: Gaynelle Arabian, MD;  Location[facility];  Service: Orthopedics;  Laterality: Right;   Patient Active Problem List   Diagnosis Date Noted   Osteoarthritis of  right knee 04/01/2022   History of obstructive sleep apnea 02/11/2022   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 serious comorbidity with body mass index (BMI) of 37.0 to 37.9 in adult 02/11/2022   Weight loss observed on examination 02/11/2022   Pre-operative clearance 01/17/2022   Primary osteoarthritis of right knee 01/17/2022   Carpal tunnel syndrome, right upper limb 11/15/2021   Numbness 11/02/2021   OSA (obstructive sleep apnea) 07/24/2020   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/13/2020   Upper airway cough syndrome 06/13/2020   Class 3 severe obesity due to excess calories with serious comorbidity and body mass index (BMI) of 45.0 to 49.9 in adult [facility]) 06/13/2020   MCI (mild cognitive impairment) 06/13/2020   Insulin resistance 05/09/2020   Insomnia 05/09/2020   Vitamin D deficiency 02/17/2019   Diarrhea 02/17/2019   Essential hypertension 02/17/2019   Class 3 severe obesity due to excess calories with serious comorbidity and body mass index (BMI) of 40.0 to 44.9 in adult ([place]) 02/17/2019   Hair loss 02/17/2019   Anxiety 11/30/2017   Primary osteoarthritis of both knees 11/30/2017   Abnormal WBC count 11/30/2017   Obesity (BMI 30-39.9) 07/23/2013   Suspicious mole 02/01/2013   Palpitations 07/23/2012  Hyperlipidemia 01/28/2012   Morbid obesity ([place]) 04/24/2011   Preventative health care 02/20/2011   Skin lump of leg 10/10/2010   SORE THROAT 08/15/2010   LYMPHADENOPATHY 08/15/2010   TOBACCO USE 01/17/2010   Hyperlipidemia associated with type 2 diabetes mellitus ([place]) 04/05/2008   TINNITUS, CHRONIC, 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   ALLERGIC RHINITIS 01/25/2008   LIVER FUNCTION TESTS, ABNORMAL 01/25/2008   Hypothyroidism 01/13/2007   Depression with anxiety 01/13/2007   TRANSIENT GLOBAL AMNESIA 01/13/2007   FOOT SURGERY, HX OF 01/13/2007    PCP: Roma Schanz  REFERRING PROVIDER: Alluisio  REFERRING DIAG: R  TKR  THERAPY DIAG:  Localized edema  Acute pain of right  knee  Difficulty in walking, not elsewhere classified  Unsteadiness on feet  Muscle weakness (generalized)  Rationale for Evaluation and Treatment Rehabilitation  ONSET DATE: 04/01/22  SUBJECTIVE:   SUBJECTIVE STATEMENT: "Good"  PERTINENT HISTORY: History of Present Illness Pt is a 76yo female presenting s/p R-TKA on 04/01/22. PMH: DM, Emphysema, HLD, Hypothyroidism, IBS, OSA on CPAP, R-carpal tunnel release 11/28/21, mild cognitive impairment  PAIN:  Are you having pain? Yes: NPRS scale: 0/10 Pain location: R knee Pain description: "hurting and annoying"  Aggravating factors: bending and getting up from a chair and toilet Relieving factors: pain medication  PRECAUTIONS: None  WEIGHT BEARING RESTRICTIONS No  FALLS:  Has patient fallen in last 6 months? No  LIVING ENVIRONMENT: Lives with: lives with their spouse Lives in: House/apartment Stairs: Yes: Internal: 14 steps; on right going up and External: 3 steps; none, has grab bar at the top Has following equipment at home: Gilford Rile - 2 wheeled, Environmental consultant - 4 wheeled, Electronics engineer, and Grab bars  OCCUPATION: retired  PLOF: Independent  PATIENT GOALS :Be able to walk with a normal gait, minimal pain.   OBJECTIVE:   PATIENT SURVEYS:  FOTO 26.11  COGNITION:  Overall cognitive status: Within functional limits for tasks assessed     SENSATION: Not tested  EDEMA:  R knee with mod edema  MUSCLE LENGTH: Hamstrings: Right deferred deg; Left 80 deg  POSTURE: rounded shoulders and flexed trunk   PALPATION: TTP around R knee.  LOWER EXTREMITY ROM: LLE ROM WFL, R hip and ankle [facility]  Active ROM Right eval Left eval 9/29 04/15/22 AROM  Hip flexion      Hip extension      Hip abduction      Hip adduction      Hip internal rotation      Hip external rotation      Knee flexion 86  100 PROM/98 AROM 106  Knee extension 11  8 AROM 6  Ankle dorsiflexion      Ankle plantarflexion      Ankle inversion      Ankle  eversion       (Blank rows = not tested)  LOWER EXTREMITY MMT:  MMT Right eval Left eval  Hip flexion 3   Hip extension 3   Hip abduction    Hip adduction    Hip internal rotation    Hip external rotation    Knee flexion 3+   Knee extension 3   Ankle dorsiflexion 4-   Ankle plantarflexion    Ankle inversion    Ankle eversion     (Blank rows = not tested)   FUNCTIONAL TESTS:  5 times sit to stand: 18.69 Timed up and go (TUG): 17.72  GAIT: Distance walked: In clinic Assistive device utilized: Walker - 2 wheeled Level of assistance: SBA  Comments: Antalgic gait, leans on BUE support    TODAY'S TREATMENT: 05/27/22 NuStep L5 x6 min 4in step ups then 6in REL x10 each 30lb Resisted gaot 4 way x 3 each . Only 2 reps of side step when resistance on R side due to L knee pain LAQ RLE 5lb 2x12 Hamstring curls Green RLE 2x15 . S2S on airex 2x10 Side step on an & Off airex Leg ptess RLE TKE 20lb 2x10.  05/22/22 Bike L3 x 6 minutes Practiced stair climbing. Patient notes that her LLE is having more difficulty in [place] activities. Problem solved how to manage steps. Step ups with RLE 10 forward and side steps on 6", the 10 crossover steps onto 4" ste. BUE support for balance, tried to not use hands to assist. B side stepping against G tband resistance just above knees 2 x 10 reps each direction. R knee flexiLEon, 20#, 2 x 10 reps R knee ext, 4#, 2 x 10 reps Vaso, 10 min, 34, med, R  05/15/22 NuStep L5 x 6 minutes Ambulated x 200' on unlevel surfaces, including up and down a curb, to access stairwell. Up and down 2 x 15 steps using combination of step over step and step to gait pattern going down, U handrail. Return with 200'. Knee flexion, 25# with BLE 2 x 10 reps, RLE only 2 x 10, 15# Knee ext, BLE 10#, 2 x 10 reps, %# with BLE up, RLE eccentric release, 2 x 10 reps Vaso 10 minutes, med pressure, R knee, 34 degrees   05/13/22 NuStep L4 x6 min  Gait to the back  building. Increase time needed to negotiate curbs. One flight of stairs, step to alternating  patern with 1 rail R when descending. Alt pattern ascending . Hamstring curls 20lb 3x10 Leg Ext 5lb 2x10  Vaso to R knee, med pressure, 34 degrees, 10 minutes   05/08/22 NuStep L5 x 6 minutes Supine knee press with heel elevated, 10 x 5 sec.-reported the same pain as last treatment med and lateral/distal to patella. No excessive swelling or redness noted. Seated knee flexion against G tband, 2 x 10 reps Knee ext against Red Tband, 2 x 10 reps. B side stepping on airex plank, x 5 in each direction. Vaso to R knee, med pressure, 34 degrees, 10 minutes  05/06/22 Recumbent bike L2 x 6 minutes- reported some pain in L knee. Supine knee press with heel elevated, light overpressure, x 4 R knee AAROM 5-116 Seated R knee flexion 15#, 2 x 10 reps Seated knee ext 5#, up with 2, down with RLE only Runners stride onto Airex pad x 20 with RLE, UUE support and CGA Lateral lunge with push back to R, 2 x 10 reps Standing knee flex with 2# resistance on R x 15 reps VASO 34, low pressure, 10 min, R knee.   1019/23 NuStep L5 x4 min Bike full revolutions L1 x 4 min  R knee PROM flex and ext Sit to stand holding yellow ball 2x10 RLE LAQ 3lb 2x10 RLE hamstring curls 2x10  4in step ups x10 each  Leg press 20lb 2x10  Gait 240 feet no A, some R lateral trunk lean   PATIENT EDUCATION:  Education details: HEP, POC Person educated: Patient Education method: Consulting civil engineer, Demonstration, and Handouts Education comprehension: verbalized understanding and returned demonstration   HOME EXERCISE PROGRAM: TKR basic exercises template-AP, HS, SAQ, KP, Abd, SLR, seated long kick, knee flexion  ASSESSMENT:  CLINICAL IMPRESSION: Patient enters doing well. Pt stated she  wanted to avoid using her LLE  due to it hurting. Continued with strengthening for quads and HS.She has good PROM. Some instability present with  non compliant surfaces. Some L knee pain with resisted side steps.  OBJECTIVE IMPAIRMENTS Abnormal gait, decreased activity tolerance, decreased balance, decreased coordination, decreased endurance, decreased mobility, difficulty walking, decreased ROM, decreased strength, hypomobility, increased edema, increased muscle spasms, impaired flexibility, and pain.   ACTIVITY LIMITATIONS bending, standing, squatting, stairs, transfers, reach over head, and locomotion level  PARTICIPATION LIMITATIONS: meal prep, cleaning, laundry, driving, shopping, and yard work  PERSONAL FACTORS Age and Past/current experiences are also affecting patient's functional outcome.   REHAB POTENTIAL: Good  CLINICAL DECISION MAKING: Evolving/moderate complexity  EVALUATION COMPLEXITY: Moderate   GOALS: Goals reviewed with patient? Yes  SHORT TERM GOALS: Target date: 05/02/2022  I with basic HEP Baseline: Goal status: met  2.  Increase R knee ROM to at least 7-95 Baseline: 11-86 Goal status: ongoing  3.  Increase R knee strength to at least 3+/5 Baseline: 3 Goal status: ongoing  LONG TERM GOALS: Target date: 06/27/2022   I with final HEP Baseline:  Goal status: ongoing  2.  Increase R knee ROM to at least 5-120 Baseline: 11-86 Goal status: ongoing  3.  Patient will climb up and down at least 14 steps with R rail going up, MI, step over step going up. Baseline: 10/16- step ups onto 4" step with SPC, 10 reps on R. Goal status: ongoing  4.  Increase r knee strength to at least 4/5 Baseline: 3 Goal status: ongoing  5.  Patient will ambulate at least 400' with LRAD on level and unlevel surfaces, MI Baseline: limited distance, RW Goal status: ongoing  6.  Increase FOTO score to at least 49 Baseline: 26 Goal status: INITIAL  7.  Complete TUG in < 12 sec with LRAD Baseline: 17.72 Goals status: ongoing   PLAN: PT FREQUENCY: 3x/week  PT DURATION: 12 weeks  PLANNED INTERVENTIONS:  Therapeutic exercises, Therapeutic activity, Neuromuscular re-education, Balance training, Gait training, Patient/Family education, Self Care, Joint mobilization, Stair training, Dry Needling, Cryotherapy, Moist heat, Vasopneumatic device, Ionotophoresis 29m/ml Dexamethasone, and Manual therapy  PLAN FOR NEXT SESSION: try bike, HEP update   SEthel RanaDPT 05/27/22 2:37 PM  05/27/2022, 2:37 PM

## 2022-05-28 ENCOUNTER — Encounter: Payer: Self-pay | Admitting: Family Medicine

## 2022-05-29 ENCOUNTER — Encounter: Payer: Self-pay | Admitting: Physical Therapy

## 2022-05-29 ENCOUNTER — Ambulatory Visit: Payer: 59 | Admitting: Physical Therapy

## 2022-05-29 DIAGNOSIS — R600 Localized edema: Secondary | ICD-10-CM

## 2022-05-29 DIAGNOSIS — M25561 Pain in right knee: Secondary | ICD-10-CM | POA: Diagnosis not present

## 2022-05-29 DIAGNOSIS — R262 Difficulty in walking, not elsewhere classified: Secondary | ICD-10-CM

## 2022-05-29 DIAGNOSIS — M6281 Muscle weakness (generalized): Secondary | ICD-10-CM

## 2022-05-29 NOTE — Therapy (Signed)
OUTPATIENT PHYSICAL THERAPY LOWER EXTREMITY TREATMENT      Patient Name: [name] [name] MRN: [id number] DOB:12/17/1945, 76 y.o., female Today's Date: 05/29/2022   PT End of Session - 05/29/22 1302     Visit Number 19    Date for PT Re-Evaluation 06/27/22    PT Start Time 1300    PT Stop Time 7425    PT Time Calculation (min) 45 min    Activity Tolerance No increased pain;Patient tolerated treatment well    Behavior During Therapy [facility] for tasks assessed/performed                       Past Medical History:  Diagnosis Date   Allergic rhinitis    Anxiety    Arthritis    Cataract    Depression    Diabetes mellitus without complication (HCC)    borderline   Emphysema lung ([place])    Early stage   Endometrial polyp    Hyperlipidemia    Hyperlipidemia    on simvastatin   Hypothyroidism    IBS (irritable bowel syndrome)    Obesity    Osteoarthritis    Seasonal allergies    Sleep apnea    cpap   Past Surgical History:  Procedure Laterality Date   BUNIONECTOMY  2009   Dr.Duda, left   CARPAL TUNNEL RELEASE Right 11/28/2021   Procedure: RIGHT CARPAL TUNNEL RELEASE;  Surgeon: Sherilyn Cooter, MD;  Location: [facility];  Service: Orthopedics;  Laterality: Right;   EYE SURGERY     HYSTEROSCOPY WITH D & C  04/24/2011   Procedure: DILATATION AND CURETTAGE (D&C) /HYSTEROSCOPY;  Surgeon: Eldred Manges, MD;  Location: [facility];  Service: Gynecology;  Laterality: N/A;   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   Dr Chrys Racer FASCIA SURGERY  1994   Dr Percell Miller   POLYPECTOMY  04/24/2011   Procedure: POLYPECTOMY;  Surgeon: Eldred Manges, MD;  Location: [facility];  Service: Gynecology;  Laterality: N/A;   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   Procedure: TOTAL KNEE ARTHROPLASTY;  Surgeon: Gaynelle Arabian, MD;  Location: [facility];  Service: Orthopedics;  Laterality: Right;   Patient Active Problem List   Diagnosis Date Noted   Osteoarthritis of  right knee 04/01/2022   History of obstructive sleep apnea 02/11/2022   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 serious comorbidity with body mass index (BMI) of 37.0 to 37.9 in adult 02/11/2022   Weight loss observed on examination 02/11/2022   Pre-operative clearance 01/17/2022   Primary osteoarthritis of right knee 01/17/2022   Carpal tunnel syndrome, right upper limb 11/15/2021   Numbness 11/02/2021   OSA (obstructive sleep apnea) 07/24/2020   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/13/2020   Upper airway cough syndrome 06/13/2020   Class 3 severe obesity due to excess calories with serious comorbidity and body mass index (BMI) of 45.0 to 49.9 in adult [facility]) 06/13/2020   MCI (mild cognitive impairment) 06/13/2020   Insulin resistance 05/09/2020   Insomnia 05/09/2020   Vitamin D deficiency 02/17/2019   Diarrhea 02/17/2019   Essential hypertension 02/17/2019   Class 3 severe obesity due to excess calories with serious comorbidity and body mass index (BMI) of 40.0 to 44.9 in adult ([place]) 02/17/2019   Hair loss 02/17/2019   Anxiety 11/30/2017   Primary osteoarthritis of both knees 11/30/2017   Abnormal WBC count 11/30/2017   Obesity (BMI 30-39.9) 07/23/2013   Suspicious mole 02/01/2013   Palpitations 07/23/2012  Hyperlipidemia 01/28/2012   Morbid obesity ([place]) 04/24/2011   Preventative health care 02/20/2011   Skin lump of leg 10/10/2010   SORE THROAT 08/15/2010   LYMPHADENOPATHY 08/15/2010   TOBACCO USE 01/17/2010   Hyperlipidemia associated with type 2 diabetes mellitus ([place]) 04/05/2008   TINNITUS, CHRONIC, 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   ALLERGIC RHINITIS 01/25/2008   LIVER FUNCTION TESTS, ABNORMAL 01/25/2008   Hypothyroidism 01/13/2007   Depression with anxiety 01/13/2007   TRANSIENT GLOBAL AMNESIA 01/13/2007   FOOT SURGERY, HX OF 01/13/2007    PCP: Roma Schanz  REFERRING PROVIDER: Alluis[place]  REFERRING DIAG: R  TKR  THERAPY DIAG:  Localized edema  Acute pain of right  knee  Difficulty in walking, not elsewhere classified  Muscle weakness (generalized)  Rationale for Evaluation and Treatment Rehabilitation  ONSET DATE: 04/01/22  SUBJECTIVE:   SUBJECTIVE STATEMENT: "Good"  PERTINENT HISTORY: History of Present Illness Pt is a 76yo female presenting s/p R-TKA on 04/01/22. PMH: DM, Emphysema, HLD, Hypothyroidism, IBS, OSA on CPAP, R-carpal tunnel release 11/28/21, mild cognitive impairment  PAIN:  Are you having pain? Yes: NPRS scale: 1/10 Pain location: L knee Pain description: "hurting and annoying"  Aggravating factors: bending and getting up from a chair and toilet Relieving factors: pain medication  PRECAUTIONS: None  WEIGHT BEARING RESTRICTIONS No  FALLS:  Has patient fallen in last 6 months? No  LIVING ENVIRONMENT: Lives with: lives with their spouse Lives in: House/apartment Stairs: Yes: Internal: 14 steps; on right going up and External: 3 steps; none, has grab bar at the top Has following equipment at home: Gilford Rile - 2 wheeled, Environmental consultant - 4 wheeled, Electronics engineer, and Grab bars  OCCUPATION: retired  PLOF: Independent  PATIENT GOALS :Be able to walk with a normal gait, minimal pain.   OBJECTIVE:   PATIENT SURVEYS:  FOTO 26.11  COGNITION:  Overall cognitive status: Within functional limits for tasks assessed     SENSATION: Not tested  EDEMA:  R knee with mod edema  MUSCLE LENGTH: Hamstrings: Right deferred deg; Left 80 deg  POSTURE: rounded shoulders and flexed trunk   PALPATION: TTP around R knee.  LOWER EXTREMITY ROM: LLE ROM WFL, R hip and ankle [facility]  Active ROM Right eval Left eval 9/29 04/15/22 AROM  Hip flexion      Hip extension      Hip abduction      Hip adduction      Hip internal rotation      Hip external rotation      Knee flexion 86  100 PROM/98 AROM 106  Knee extension 11  8 AROM 6  Ankle dorsiflexion      Ankle plantarflexion      Ankle inversion      Ankle eversion       (Blank rows =  not tested)  LOWER EXTREMITY MMT:  MMT Right eval Left eval  Hip flexion 3   Hip extension 3   Hip abduction    Hip adduction    Hip internal rotation    Hip external rotation    Knee flexion 3+   Knee extension 3   Ankle dorsiflexion 4-   Ankle plantarflexion    Ankle inversion    Ankle eversion     (Blank rows = not tested)   FUNCTIONAL TESTS:  5 times sit to stand: 18.69 Timed up and go (TUG): 17.72  GAIT: Distance walked: In clinic Assistive device utilized: Walker - 2 wheeled Level of assistance: SBA Comments: Antalgic gait, leans  on BUE support    TODAY'S TREATMENT: 05/29/22 NuStep L5 x 6 min S2S holding blue ball 2x12 4 in box on airex RLE step ups x10 LAQ RLE 5lb 2x15 Hamstring Curls Green 2x15 Side step on an & Off airex Side step in and out 6 in box Leg press RLE 20lb 2x10    05/27/22 NuStep L5 x6 min 4in step ups then 6in REL x10 each 30lb Resisted gaot 4 way x 3 each . Only 2 reps of side step when resistance on R side due to L knee pain LAQ RLE 5lb 2x12 Hamstring curls Green RLE 2x15 . S2S on airex 2x10 Side step on an & Off airex Leg ptess RLE TKE 20lb 2x10.  05/22/22 Bike L3 x 6 minutes Practiced stair climbing. Patient notes that her LLE is having more difficulty in [place] activities. Problem solved how to manage steps. Step ups with RLE 10 forward and side steps on 6", the 10 crossover steps onto 4" ste. BUE support for balance, tried to not use hands to assist. B side stepping against G tband resistance just above knees 2 x 10 reps each direction. R knee flexiLEon, 20#, 2 x 10 reps R knee ext, 4#, 2 x 10 reps Vaso, 10 min, 34, med, R  05/15/22 NuStep L5 x 6 minutes Ambulated x 200' on unlevel surfaces, including up and down a curb, to access stairwell. Up and down 2 x 15 steps using combination of step over step and step to gait pattern going down, U handrail. Return with 200'. Knee flexion, 25# with BLE 2 x 10 reps, RLE only 2 x  10, 15# Knee ext, BLE 10#, 2 x 10 reps, %# with BLE up, RLE eccentric release, 2 x 10 reps Vaso 10 minutes, med pressure, R knee, 34 degrees   05/13/22 NuStep L4 x6 min  Gait to the back building. Increase time needed to negotiate curbs. One flight of stairs, step to alternating  patern with 1 rail R when descending. Alt pattern ascending . Hamstring curls 20lb 3x10 Leg Ext 5lb 2x10  Vaso to R knee, med pressure, 34 degrees, 10 minutes   05/08/22 NuStep L5 x 6 minutes Supine knee press with heel elevated, 10 x 5 sec.-reported the same pain as last treatment med and lateral/distal to patella. No excessive swelling or redness noted. Seated knee flexion against G tband, 2 x 10 reps Knee ext against Red Tband, 2 x 10 reps. B side stepping on airex plank, x 5 in each direction. Vaso to R knee, med pressure, 34 degrees, 10 minutes  05/06/22 Recumbent bike L2 x 6 minutes- reported some pain in L knee. Supine knee press with heel elevated, light overpressure, x 4 R knee AAROM 5-116 Seated R knee flexion 15#, 2 x 10 reps Seated knee ext 5#, up with 2, down with RLE only Runners stride onto Airex pad x 20 with RLE, UUE support and CGA Lateral lunge with push back to R, 2 x 10 reps Standing knee flex with 2# resistance on R x 15 reps VASO 34, low pressure, 10 min, R knee.   1019/23 NuStep L5 x4 min Bike full revolutions L1 x 4 min  R knee PROM flex and ext Sit to stand holding yellow ball 2x10 RLE LAQ 3lb 2x10 RLE hamstring curls 2x10  4in step ups x10 each  Leg press 20lb 2x10  Gait 240 feet no A, some R lateral trunk lean   PATIENT EDUCATION:  Education details: HEP, POC  Person educated: Patient Education method: Explanation, Demonstration, and Handouts Education comprehension: verbalized understanding and returned demonstration   HOME EXERCISE PROGRAM: TKR basic exercises template-AP, HS, SAQ, KP, Abd, SLR, seated long kick, knee flexion  ASSESSMENT:  CLINICAL  IMPRESSION: Patient enters doing well. Again she wanted to avoid using her LLE  due to it hurting. Continued with strengthening for quads and HS.She has good PROM. Some instability present with non compliant surfaces. Some L knee pain reported when attempting eccentric load with step ups  OBJECTIVE IMPAIRMENTS Abnormal gait, decreased activity tolerance, decreased balance, decreased coordination, decreased endurance, decreased mobility, difficulty walking, decreased ROM, decreased strength, hypomobility, increased edema, increased muscle spasms, impaired flexibility, and pain.   ACTIVITY LIMITATIONS bending, standing, squatting, stairs, transfers, reach over head, and locomotion level  PARTICIPATION LIMITATIONS: meal prep, cleaning, laundry, driving, shopping, and yard work  PERSONAL FACTORS Age and Past/current experiences are also affecting patient's functional outcome.   REHAB POTENTIAL: Good  CLINICAL DECISION MAKING: Evolving/moderate complexity  EVALUATION COMPLEXITY: Moderate   GOALS: Goals reviewed with patient? Yes  SHORT TERM GOALS: Target date: 05/02/2022  I with basic HEP Baseline: Goal status: met  2.  Increase R knee ROM to at least 7-95 Baseline: 11-86 Goal status: ongoing  3.  Increase R knee strength to at least 3+/5 Baseline: 3 Goal status: ongoing  LONG TERM GOALS: Target date: 06/27/2022   I with final HEP Baseline:  Goal status: ongoing  2.  Increase R knee ROM to at least 5-120 Baseline: 11-86 Goal status: ongoing  3.  Patient will climb up and down at least 14 steps with R rail going up, MI, step over step going up. Baseline: 10/16- step ups onto 4" step with SPC, 10 reps on R. Goal status: ongoing  4.  Increase r knee strength to at least 4/5 Baseline: 3 Goal status: ongoing  5.  Patient will ambulate at least 400' with LRAD on level and unlevel surfaces, MI Baseline: limited distance, RW Goal status: ongoing  6.  Increase FOTO score to  at least 49 Baseline: 26 Goal status: INITIAL  7.  Complete TUG in < 12 sec with LRAD Baseline: 17.72 Goals status: ongoing   PLAN: PT FREQUENCY: 3x/week  PT DURATION: 12 weeks  PLANNED INTERVENTIONS: Therapeutic exercises, Therapeutic activity, Neuromuscular re-education, Balance training, Gait training, Patient/Family education, Self Care, Joint mobilization, Stair training, Dry Needling, Cryotherapy, Moist heat, Vasopneumatic device, Ionotophoresis 46m/ml Dexamethasone, and Manual therapy  PLAN FOR NEXT SESSION: try bike, HEP update   SEthel RanaDPT 05/29/22 1:05 PM  05/29/2022, 1:05 PM

## 2022-06-03 ENCOUNTER — Ambulatory Visit: Payer: 59 | Admitting: Physical Therapy

## 2022-06-03 ENCOUNTER — Encounter: Payer: 59 | Admitting: Family Medicine

## 2022-06-03 ENCOUNTER — Encounter: Payer: Self-pay | Admitting: Physical Therapy

## 2022-06-03 DIAGNOSIS — R600 Localized edema: Secondary | ICD-10-CM

## 2022-06-03 DIAGNOSIS — M25561 Pain in right knee: Secondary | ICD-10-CM | POA: Diagnosis not present

## 2022-06-03 DIAGNOSIS — R2681 Unsteadiness on feet: Secondary | ICD-10-CM

## 2022-06-03 DIAGNOSIS — M6281 Muscle weakness (generalized): Secondary | ICD-10-CM

## 2022-06-03 DIAGNOSIS — R262 Difficulty in walking, not elsewhere classified: Secondary | ICD-10-CM

## 2022-06-03 DIAGNOSIS — R278 Other lack of coordination: Secondary | ICD-10-CM

## 2022-06-03 NOTE — Therapy (Signed)
OUTPATIENT PHYSICAL THERAPY LOWER EXTREMITY TREATMENT      Patient Name: [name] [name] MRN: [id number] DOB:1946-03-17, 76 y.o., female Today's Date: 06/03/2022   PT End of Session - 06/03/22 1111     Visit Number 20    Date for PT Re-Evaluation 06/27/22    PT Start Time 1102    PT Stop Time 1140    PT Time Calculation (min) 38 min    Activity Tolerance No increased pain;Patient tolerated treatment well    Behavior During Therapy [facility] for tasks assessed/performed           PHYSICAL THERAPY DISCHARGE SUMMARY  Visits from Start of Care: 20  Current functional level related to goals / functional outcomes: R knee rehab complete   Remaining deficits: L knee pain and weakness   Education / Equipment: HEP   Patient agrees to discharge. Patient goals were met. Patient is being discharged due to maximized rehab potential.    Past Medical History:  Diagnosis Date   Allergic rhinitis    Anxiety    Arthritis    Cataract    Depression    Diabetes mellitus without complication ([place])    borderline   Emphysema lung ([place])    Early stage   Endometrial polyp    Hyperlipidemia    Hyperlipidemia    on simvastatin   Hypothyroidism    IBS (irritable bowel syndrome)    Obesity    Osteoarthritis    Seasonal allergies    Sleep apnea    cpap   Past Surgical History:  Procedure Laterality Date   BUNIONECTOMY  2009   Dr.Duda, left   CARPAL TUNNEL RELEASE Right 11/28/2021   Procedure: RIGHT CARPAL TUNNEL RELEASE;  Surgeon: Sherilyn Cooter, MD;  Location: [place];  Service: Orthopedics;  Laterality: Right;   EYE SURGERY     HYSTEROSCOPY WITH D & C  04/24/2011   Procedure: DILATATION AND CURETTAGE (D&C) /HYSTEROSCOPY;  Surgeon: Eldred Manges, MD;  Location: [facility];  Service: Gynecology;  Laterality: N/A;   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   Dr Chrys Racer FASCIA SURGERY  1994   Dr Percell Miller   POLYPECTOMY  04/24/2011   Procedure: POLYPECTOMY;   Surgeon: Eldred Manges, MD;  Location: [facility];  Service: Gynecology;  Laterality: N/A;   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   Procedure: TOTAL KNEE ARTHROPLASTY;  Surgeon: Gaynelle Arabian, MD;  Location: [facility];  Service: Orthopedics;  Laterality: Right;   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   Upper airway cough syndrome 06/13/2020   Class 3 severe obesity due to excess calories with serious comorbidity and body mass index (BMI) of 45.0 to 49.9 in adult [facility]) 06/13/2020   MCI (mild cognitive impairment) 06/13/2020   Insulin resistance 05/09/2020   Insomnia 05/09/2020   Vitamin D deficiency 02/17/2019   Diarrhea 02/17/2019   Essential hypertension 02/17/2019   Class 3 severe obesity due to excess calories with serious comorbidity and  body mass index (BMI) of 40.0 to 44.9 in adult [facility]) 02/17/2019   Hair loss 02/17/2019   Anxiety 11/30/2017   Primary osteoarthritis of both knees 11/30/2017   Abnormal WBC count 11/30/2017   Obesity (BMI 30-39.9) 07/23/2013   Suspicious mole 02/01/2013   Palpitations 07/23/2012   Hyperlipidemia 01/28/2012   Morbid obesity ([place]) 04/24/2011   Preventative health care 02/20/2011   Skin lump of leg 10/10/2010   SORE THROAT 08/15/2010   LYMPHADENOPATHY 08/15/2010   TOBACCO USE 01/17/2010   Hyperlipidemia associated with type 2 diabetes mellitus ([place]) 04/05/2008   TINNITUS, CHRONIC, 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   ALLERGIC RHINITIS 01/25/2008    LIVER FUNCTION TESTS, ABNORMAL 01/25/2008   Hypothyroidism 01/13/2007   Depression with anxiety 01/13/2007   TRANSIENT GLOBAL AMNESIA 01/13/2007   FOOT SURGERY, HX OF 01/13/2007    PCP: Roma Schanz  REFERRING PROVIDER: [place]  REFERRING DIAG: R  TKR  THERAPY DIAG:  Localized edema  Acute pain of right knee  Other lack of coordination  Unsteadiness on feet  Muscle weakness (generalized)  Difficulty in walking, not elsewhere classified  Rationale for Evaluation and Treatment Rehabilitation  ONSET DATE: 04/01/22  SUBJECTIVE:   SUBJECTIVE STATEMENT: Patient reports that her rehab is going very well. It has been a more positive experience than anticipated. Other knee to be replaced in March  PERTINENT HISTORY: History of Present Illness Pt is a 76yo female presenting s/p R-TKA on 04/01/22. PMH: DM, Emphysema, HLD, Hypothyroidism, IBS, OSA on CPAP, R-carpal tunnel release 11/28/21, mild cognitive impairment  PAIN:  Are you having pain? Yes: NPRS scale: 1/10 Pain location: L knee Pain description: "hurting and annoying"  Aggravating factors: bending and getting up from a chair and toilet Relieving factors: pain medication  PRECAUTIONS: None  WEIGHT BEARING RESTRICTIONS No  FALLS:  Has patient fallen in last 6 months? No  LIVING ENVIRONMENT: Lives with: lives with their spouse Lives in: House/apartment Stairs: Yes: Internal: 14 steps; on right going up and External: 3 steps; none, has grab bar at the top Has following equipment at home: Gilford Rile - 2 wheeled, Environmental consultant - 4 wheeled, Electronics engineer, and Grab bars  OCCUPATION: retired  PLOF: Independent  PATIENT GOALS :Be able to walk with a normal gait, minimal pain.   OBJECTIVE:   PATIENT SURVEYS:  FOTO 26.11  COGNITION:  Overall cognitive status: Within functional limits for tasks assessed     SENSATION: Not tested  EDEMA:  R knee with mod edema  MUSCLE LENGTH: Hamstrings: Right deferred  deg; Left 80 deg  POSTURE: rounded shoulders and flexed trunk   PALPATION: TTP around R knee.  LOWER EXTREMITY ROM: LLE ROM WFL, R hip and ankle [facility]  Active ROM Right eval Left eval 9/29 04/15/22 AROM  Hip flexion      Hip extension      Hip abduction      Hip adduction      Hip internal rotation      Hip external rotation      Knee flexion 86  100 PROM/98 AROM 106  Knee extension 11  8 AROM 6  Ankle dorsiflexion      Ankle plantarflexion      Ankle inversion      Ankle eversion       (Blank rows = not tested)  LOWER EXTREMITY MMT:  MMT Right eval Left eval  Hip flexion 3   Hip extension 3   Hip abduction    Hip adduction    Hip  internal rotation    Hip external rotation    Knee flexion 3+   Knee extension 3   Ankle dorsiflexion 4-   Ankle plantarflexion    Ankle inversion    Ankle eversion     (Blank rows = not tested)   FUNCTIONAL TESTS:  5 times sit to stand: 18.69 Timed up and go (TUG): 17.72  GAIT: Distance walked: In clinic Assistive device utilized: Walker - 2 wheeled Level of assistance: SBA Comments: Antalgic gait, leans on BUE support    TODAY'S TREATMENT: 06/03/22 Bike x 4:30 at L3 Knee curls 20#, 2 x 10 with RLE only Knee ext 5#, 2 x 10 reps BLE. Functional re-assessment for D/C. HEP updated with exercises for both R and L LE in rehab and pre-hab  05/29/22 NuStep L5 x 6 min S2S holding blue ball 2x12 4 in box on airex RLE step ups x10 LAQ RLE 5lb 2x15 Hamstring Curls Green 2x15 Side step on an & Off airex Side step in and out 6 in box Leg press RLE 20lb 2x10    05/27/22 NuStep L5 x6 min 4in step ups then 6in REL x10 each 30lb Resisted gaot 4 way x 3 each . Only 2 reps of side step when resistance on R side due to L knee pain LAQ RLE 5lb 2x12 Hamstring curls Green RLE 2x15 . S2S on airex 2x10 Side step on an & Off airex Leg ptess RLE TKE 20lb 2x10.  05/22/22 Bike L3 x 6 minutes Practiced stair climbing. Patient notes  that her LLE is having more difficulty in [place] activities. Problem solved how to manage steps. Step ups with RLE 10 forward and side steps on 6", the 10 crossover steps onto 4" ste. BUE support for balance, tried to not use hands to assist. B side stepping against G tband resistance just above knees 2 x 10 reps each direction. R knee flexiLEon, 20#, 2 x 10 reps R knee ext, 4#, 2 x 10 reps Vaso, 10 min, 34, med, R  05/15/22 NuStep L5 x 6 minutes Ambulated x 200' on unlevel surfaces, including up and down a curb, to access stairwell. Up and down 2 x 15 steps using combination of step over step and step to gait pattern going down, U handrail. Return with 200'. Knee flexion, 25# with BLE 2 x 10 reps, RLE only 2 x 10, 15# Knee ext, BLE 10#, 2 x 10 reps, %# with BLE up, RLE eccentric release, 2 x 10 reps Vaso 10 minutes, med pressure, R knee, 34 degrees   05/13/22 NuStep L4 x6 min  Gait to the back building. Increase time needed to negotiate curbs. One flight of stairs, step to alternating  patern with 1 rail R when descending. Alt pattern ascending . Hamstring curls 20lb 3x10 Leg Ext 5lb 2x10  Vaso to R knee, med pressure, 34 degrees, 10 minutes   05/08/22 NuStep L5 x 6 minutes Supine knee press with heel elevated, 10 x 5 sec.-reported the same pain as last treatment med and lateral/distal to patella. No excessive swelling or redness noted. Seated knee flexion against G tband, 2 x 10 reps Knee ext against Red Tband, 2 x 10 reps. B side stepping on airex plank, x 5 in each direction. Vaso to R knee, med pressure, 34 degrees, 10 minutes  05/06/22 Recumbent bike L2 x 6 minutes- reported some pain in L knee. Supine knee press with heel elevated, light overpressure, x 4 R knee AAROM 5-116 Seated R knee  flexion 15#, 2 x 10 reps Seated knee ext 5#, up with 2, down with RLE only Runners stride onto Airex pad x 20 with RLE, UUE support and CGA Lateral lunge with push back to R, 2 x 10  reps Standing knee flex with 2# resistance on R x 15 reps VASO 34, low pressure, 10 min, R knee.   1019/23 NuStep L5 x4 min Bike full revolutions L1 x 4 min  R knee PROM flex and ext Sit to stand holding yellow ball 2x10 RLE LAQ 3lb 2x10 RLE hamstring curls 2x10  4in step ups x10 each  Leg press 20lb 2x10  Gait 240 feet no A, some R lateral trunk lean   PATIENT EDUCATION:  Education details: HEP, POC Person educated: Patient Education method: Consulting civil engineer, Demonstration, and Handouts Education comprehension: verbalized understanding and returned demonstration   HOME EXERCISE PROGRAM: TKR basic exercises template-AP, HS, SAQ, KP, Abd, SLR, seated long kick, knee flexion [id number]  ASSESSMENT:  CLINICAL IMPRESSION: Patient reports that she is very pleased with her rehab, it went better than anticipated. Performed re-assessment and updated HEP to address both rehab of R knee and prehab for L knee which is to be replaced in March.  OBJECTIVE IMPAIRMENTS Abnormal gait, decreased activity tolerance, decreased balance, decreased coordination, decreased endurance, decreased mobility, difficulty walking, decreased ROM, decreased strength, hypomobility, increased edema, increased muscle spasms, impaired flexibility, and pain.   ACTIVITY LIMITATIONS bending, standing, squatting, stairs, transfers, reach over head, and locomotion level  PARTICIPATION LIMITATIONS: meal prep, cleaning, laundry, driving, shopping, and yard work  PERSONAL FACTORS Age and Past/current experiences are also affecting patient's functional outcome.   REHAB POTENTIAL: Good  CLINICAL DECISION MAKING: Evolving/moderate complexity  EVALUATION COMPLEXITY: Moderate   GOALS: Goals reviewed with patient? Yes  SHORT TERM GOALS: Target date: 05/02/2022  I with basic HEP Baseline: Goal status: met  2.  Increase R knee ROM to at least 7-95 Baseline: 11-86 Goal status: met  3.  Increase R knee strength to at  least 3+/5 Baseline: 3 Goal status: met  LONG TERM GOALS: Target date: 06/27/2022   I with final HEP Baseline:  Goal status: met  2.  Increase R knee ROM to at least 5-120 Baseline: 11-86, 11/20-3-120 Goal status: met  3.  Patient will climb up and down at least 14 steps with R rail going up, MI, step over step going up. Baseline: 10/16- step ups onto 4" step with SPC, 10 reps on R. 11/20-MI Goal status: met  4.  Increase r knee strength to at least 4/5 Baseline: 3, 11/20-4/5 Goal status: met  5.  Patient will ambulate at least 400' with LRAD on level and unlevel surfaces, MI Baseline: limited distance, RW, 11/20-400'+, no AD, unlevel surfaces Goal status: met  6.  Increase FOTO score to at least 49 Baseline: 26, 11/20 58 Goal status: met  7.  Complete TUG in < 12 sec with LRAD Baseline: 17.72, 11/20-9.47 sec Goals status: met   PLAN: PT FREQUENCY: 3x/week  PT DURATION: 12 weeks  PLANNED INTERVENTIONS: Therapeutic exercises, Therapeutic activity, Neuromuscular re-education, Balance training, Gait training, Patient/Family education, Self Care, Joint mobilization, Stair training, Dry Needling, Cryotherapy, Moist heat, Vasopneumatic device, Ionotophoresis 69m/ml Dexamethasone, and Manual therapy  PLAN FOR NEXT SESSION: try bike, HEP update   SEthel RanaDPT 06/03/22 11:57 AM  06/03/2022, 11:57 AM

## 2022-06-10 LAB — HM DIABETES EYE EXAM

## 2022-06-21 ENCOUNTER — Other Ambulatory Visit: Payer: Self-pay | Admitting: Family Medicine

## 2022-07-06 ENCOUNTER — Other Ambulatory Visit: Payer: Self-pay | Admitting: Family Medicine

## 2022-07-06 DIAGNOSIS — E039 Hypothyroidism, unspecified: Secondary | ICD-10-CM

## 2022-08-30 ENCOUNTER — Ambulatory Visit: Payer: 59 | Admitting: Family Medicine

## 2022-08-30 ENCOUNTER — Encounter (HOSPITAL_COMMUNITY): Payer: Self-pay

## 2022-08-30 VITALS — BP 110/78 | HR 66 | Temp 97.5°F | Resp 16 | Ht <= 58 in | Wt 182.8 lb

## 2022-08-30 DIAGNOSIS — E1165 Type 2 diabetes mellitus with hyperglycemia: Secondary | ICD-10-CM | POA: Diagnosis not present

## 2022-08-30 DIAGNOSIS — E1169 Type 2 diabetes mellitus with other specified complication: Secondary | ICD-10-CM

## 2022-08-30 DIAGNOSIS — E039 Hypothyroidism, unspecified: Secondary | ICD-10-CM | POA: Diagnosis not present

## 2022-08-30 DIAGNOSIS — I10 Essential (primary) hypertension: Secondary | ICD-10-CM

## 2022-08-30 DIAGNOSIS — F419 Anxiety disorder, unspecified: Secondary | ICD-10-CM

## 2022-08-30 DIAGNOSIS — R8290 Unspecified abnormal findings in urine: Secondary | ICD-10-CM | POA: Diagnosis not present

## 2022-08-30 DIAGNOSIS — E559 Vitamin D deficiency, unspecified: Secondary | ICD-10-CM

## 2022-08-30 DIAGNOSIS — E785 Hyperlipidemia, unspecified: Secondary | ICD-10-CM

## 2022-08-30 LAB — POC URINALSYSI DIPSTICK (AUTOMATED)
Bilirubin, UA: NEGATIVE
Blood, UA: NEGATIVE
Glucose, UA: NEGATIVE
Ketones, UA: NEGATIVE
Nitrite, UA: NEGATIVE
Protein, UA: NEGATIVE
Spec Grav, UA: <=1.005 — AB (ref 1.010–1.025)
Urobilinogen, UA: 0.2 E.U./dL
pH, UA: 6 (ref 5.0–8.0)

## 2022-08-30 NOTE — Assessment & Plan Note (Signed)
stable °

## 2022-08-30 NOTE — Progress Notes (Addendum)
PCP - Seabron Spates, DO  LOV 08-30-22 /clearance on chart 06-25-22 Cardiologist - no  PPM/ICD -  Device Orders -  Rep Notified -   Chest x-ray -  EKG - 01-17-22 epic Stress Test -  ECHO - 2022 epic Cardiac Cath -   Sleep Study -  CPAP -   Fasting Blood Sugar -  Checks Blood Sugar __0___ times a day  Blood Thinner Instructions: Aspirin Instructions:  ERAS Protcol - PRE-SURGERY G2-    COVID vaccine -Yes  Activity--Able to walk in neighborhood without SOB and CP Anesthesia review: mild murmur, OSA,COPD, HTN, DM  Patient denies shortness of breath, fever, cough and chest pain at PAT appointment   All instructions explained to the patient, with a verbal understanding of the material. Patient agrees to go over the instructions while at home for a better understanding. Patient also instructed to self quarantine after being tested for COVID-19. The opportunity to ask questions was provided.

## 2022-08-30 NOTE — Patient Instructions (Signed)
SURGICAL WAITING ROOM VISITATION  Patients having surgery or a procedure may have no more than 2 support people in the waiting area - these visitors may rotate.    Children under the age of 51 must have an adult with them who is not the patient.  Due to an increase in RSV and influenza rates and associated hospitalizations, children ages 32 and under may not visit patients in [place].  If the patient needs to stay at the hospital during part of their recovery, the visitor guidelines for inpatient rooms apply. Pre-op nurse will coordinate an appropriate time for 1 support person to accompany patient in pre-op.  This support person may not rotate.    Please refer to the [facility] website for the visitor guidelines for Inpatients (after your surgery is over and you are in a regular room).       Your procedure is scheduled on: 09-16-22   Report to [facility] Main Entrance    Report to admitting at       0800  AM   Call this number if you have problems the morning of surgery [phone]   Do not eat food :After Midnight.   After Midnight you may have the following liquids until _0730_____ AM/ DAY OF SURGERY   then nothing by mouth  Water Non-Citrus Juices (without pulp, NO RED-Apple, White grape, White cranberry) Black Coffee (NO MILK/CREAM OR CREAMERS, sugar ok)  Clear Tea (NO MILK/CREAM OR CREAMERS, sugar ok) regular and decaf                             Plain Jell-O (NO RED)                                           Fruit ices (not with fruit pulp, NO RED)                                     Popsicles (NO RED)                                                               Sports drinks like Gatorade (NO RED)                  The day of surgery:  Drink ONE (1) Pre-Surgery  G2 at      0715  AM the morning of surgery. Drink in one sitting. Do not sip.  This drink was given to you during your hospital  pre-op appointment visit. Nothing else to drink after  completing the  Pre-Surgery G2.   By 0730          If you have questions, please contact your surgeon's office.   FOLLOW BOWEL PREP AND ANY ADDITIONAL PRE OP INSTRUCTIONS YOU RECEIVED FROM YOUR SURGEON'S OFFICE!!!     Oral Hygiene is also important to reduce your risk of infection.  Remember - BRUSH YOUR TEETH THE MORNING OF SURGERY WITH YOUR REGULAR TOOTHPASTE  DENTURES WILL BE REMOVED PRIOR TO SURGERY PLEASE DO NOT APPLY "Poly grip" OR ADHESIVES!!!   Do NOT smoke after Midnight   Take these medicines the morning of surgery with A SIP OF WATER: sertraline, levothyroxine  DO NOT TAKE ANY ORAL DIABETIC MEDICATIONS DAY OF YOUR SURGERY  Bring CPAP mask and tubing day of surgery.                              You may not have any metal on your body including hair pins, jewelry, and body piercing             Do not wear make-up, lotions, powders, perfumes/cologne, or deodorant  Do not wear nail polish including gel and S&S, artificial/acrylic nails, or any other type of covering on natural nails including finger and toenails. If you have artificial nails, gel coating, etc. that needs to be removed by a nail salon please have this removed prior to surgery or surgery may need to be canceled/ delayed if the surgeon/ anesthesia feels like they are unable to be safely monitored.   Do not shave  48 hours prior to surgery.           Do not bring valuables to the hospital. [place].   Contacts, glasses, dentures or bridgework may not be worn into surgery.   Bring small overnight bag day of surgery.   DO NOT [place]. PHARMACY WILL DISPENSE MEDICATIONS LISTED ON YOUR MEDICATION LIST TO YOU DURING YOUR ADMISSION [place]!    Patients discharged on the day of surgery will not be allowed to drive home.  Someone NEEDS to stay with you for the first 24 hours after  anesthesia.   Special Instructions: Bring a copy of your healthcare power of attorney and living will documents the day of surgery if you haven't scanned them before.              Please read over the following fact sheets you were given: IF [place] [phone]   If you received a COVID test during your pre-op visit  it is requested that you wear a mask when out in public, stay away from anyone that may not be feeling well and notify your surgeon if you develop symptoms. If you test positive for Covid or have been in contact with anyone that has tested positive in the last 10 days please notify you surgeon.     - Preparing for Surgery Before surgery, you can play an important role.  Because skin is not sterile, your skin needs to be as free of germs as possible.  You can reduce the number of germs on your skin by washing with CHG (chlorahexidine gluconate) soap before surgery.  CHG is an antiseptic cleaner which kills germs and bonds with the skin to continue killing germs even after washing. Please DO NOT use if you have an allergy to CHG or antibacterial soaps.  If your skin becomes reddened/irritated stop using the CHG and inform your nurse when you arrive at Short Stay. Do not shave (including legs and underarms) for at least 48 hours prior to the first CHG shower.  You may  shave your face/neck. Please follow these instructions carefully:  1.  Shower with CHG Soap the night before surgery and the  morning of Surgery.  2.  If you choose to wash your hair, wash your hair first as usual with your  normal  shampoo.  3.  After you shampoo, rinse your hair and body thoroughly to remove the  shampoo.                           4.  Use CHG as you would any other liquid soap.  You can apply chg directly  to the skin and wash                       Gently with a scrungie or clean washcloth.  5.  Apply the CHG Soap to your body ONLY FROM THE  NECK DOWN.   Do not use on face/ open                           Wound or open sores. Avoid contact with eyes, ears mouth and genitals (private parts).                       Wash face,  Genitals (private parts) with your normal soap.             6.  Wash thoroughly, paying special attention to the area where your surgery  will be performed.  7.  Thoroughly rinse your body with warm water from the neck down.  8.  DO NOT shower/wash with your normal soap after using and rinsing off  the CHG Soap.                9.  Pat yourself dry with a clean towel.            10.  Wear clean pajamas.            11.  Place clean sheets on your bed the night of your first shower and do not  sleep with pets. Day of Surgery : Do not apply any lotions/deodorants the morning of surgery.  Please wear clean clothes to the hospital/surgery center.  FAILURE TO FOLLOW THESE INSTRUCTIONS MAY RESULT IN THE CANCELLATION OF YOUR SURGERY PATIENT SIGNATURE_________________________________  NURSE SIGNATURE__________________________________  ________________________________________________________________________  [name] [name]  An incentive spirometer is a tool that can help keep your lungs clear and active. This tool measures how well you are filling your lungs with each breath. Taking long deep breaths may help reverse or decrease the chance of developing breathing (pulmonary) problems (especially infection) following: A long period of time when you are unable to move or be active. BEFORE THE PROCEDURE  If the spirometer includes an indicator to show your best effort, your nurse or respiratory therapist will set it to a desired goal. If possible, sit up straight or lean slightly forward. Try not to slouch. Hold the incentive spirometer in an upright position. INSTRUCTIONS FOR USE  Sit on the edge of your bed if possible, or sit up as far as you can in bed or on a chair. Hold the incentive spirometer in an upright  position. Breathe out normally. Place the mouthpiece in your mouth and seal your lips tightly around it. Breathe in slowly and as deeply as possible, raising the piston or the ball toward the top of the column.  Hold your breath for 3-5 seconds or for as long as possible. Allow the piston or ball to fall to the bottom of the column. Remove the mouthpiece from your mouth and breathe out normally. Rest for a few seconds and repeat Steps 1 through 7 at least 10 times every 1-2 hours when you are awake. Take your time and take a few normal breaths between deep breaths. The spirometer may include an indicator to show your best effort. Use the indicator as a goal to work toward during each repetition. After each set of 10 deep breaths, practice coughing to be sure your lungs are clear. If you have an incision (the cut made at the time of surgery), support your incision when coughing by placing a pillow or rolled up towels firmly against it. Once you are able to get out of bed, walk around indoors and cough well. You may stop using the incentive spirometer when instructed by your caregiver.  RISKS AND COMPLICATIONS Take your time so you do not get dizzy or light-headed. If you are in pain, you may need to take or ask for pain medication before doing incentive spirometry. It is harder to take a deep breath if you are having pain. AFTER USE Rest and breathe slowly and easily. It can be helpful to keep track of a log of your progress. Your caregiver can provide you with a simple table to help with this. If you are using the spirometer at home, follow these instructions: [place] IF:  You are having difficultly using the spirometer. You have trouble using the spirometer as often as instructed. Your pain medication is not giving enough relief while using the spirometer. You develop fever of 100.5 F (38.1 C) or higher. SEEK IMMEDIATE MEDICAL CARE IF:  You cough up bloody sputum that had not been  present before. You develop fever of 102 F (38.9 C) or greater. You develop worsening pain at or near the incision site. MAKE SURE YOU:  Understand these instructions. Will watch your condition. Will get help right away if you are not doing well or get worse. Document Released: 11/11/2006 Document Revised: 09/23/2011 Document Reviewed: 01/12/2007 [facility] Patient Information 2014 [place], [place].   ________________________________________________________________________

## 2022-08-30 NOTE — Assessment & Plan Note (Signed)
Tolerating statin, encouraged heart healthy diet, avoid trans fats, minimize simple carbs and saturated fats. Increase exercise as tolerated 

## 2022-08-30 NOTE — Patient Instructions (Signed)
Thyroid-Stimulating Hormone Test Why am I having this test? The thyroid is a gland in the lower front of the neck. It makes hormones that affect many body parts and systems, including the system that affects how quickly the body burns fuel for energy (metabolism). The pituitary gland is located just below the brain, behind the eyes and nasal passages. It helps maintain thyroid hormone levels and thyroid gland function. You may have a thyroid-stimulating hormone (TSH) test if you have possible symptoms of abnormal thyroid hormone levels. This test can help your health care provider: Diagnose a disorder of the thyroid gland or pituitary gland. Manage your condition and treatment if you have an underactive thyroid (hypothyroidism) or an overactive thyroid (hyperthyroidism). Newborn babies may have this test done to screen for hypothyroidism that is present at birth (congenital). What is being tested? This test measures the amount of TSH in your blood. TSH may also be called thyrotropin. When the thyroid does not make enough hormones, the pituitary gland releases TSH into the bloodstream to stimulate the thyroid gland to make more hormones. What kind of sample is taken?     A blood sample is required for this test. It is usually collected by inserting a needle into a blood vessel. For newborns, a small amount of blood may be collected from the umbilical cord, or by using a small needle to prick the baby's heel (heel stick). Tell a health care provider about: All medicines you are taking, including vitamins, herbs, eye drops, creams, and over-the-counter medicines. Any bleeding problems you have. Any surgeries you have had. Any medical conditions you have. Whether you are pregnant or may be pregnant. How are the results reported? Your test results will be reported as a value that indicates how much TSH is in your blood. Your health care provider will compare your results to normal ranges that were  established after testing a large group of people (reference ranges). Reference ranges may vary among labs and hospitals. For this test, common reference ranges are: Adult: 2-10 microunits/mL or 2-10 milliunits/L. Newborn: Heel stick: 3-18 microunits/mL or 3-18 milliunits/L. Umbilical cord: 5-40 microunits/mL or 3-12 milliunits/L. What do the results mean? Results that are within the reference range are considered normal. This means that you have a normal amount of TSH in your blood. Results that are higher than the reference range mean that your TSH levels are too high. This may mean: Your thyroid gland is not making enough thyroid hormones. Your thyroid medicine dosage is too low. You have a tumor on your pituitary gland. This is rare. Results that are lower than the reference range mean that your TSH levels are too low. This may be caused by hyperthyroidism or by a problem with the pituitary gland function. Talk with your health care provider about what your results mean. Questions to ask your health care provider Ask your health care provider, or the department that is doing the test: When will my results be ready? How will I get my results? What are my treatment options? What other tests do I need? What are my next steps? Summary You may have a thyroid-stimulating hormone (TSH) test if you have possible symptoms of abnormal thyroid hormone levels. The thyroid is a gland in the lower front of the neck. It makes hormones that affect many body parts and systems. The pituitary gland is located just below the brain, behind the eyes and nasal passages. It helps maintain thyroid hormone levels and thyroid gland function. This test  measures the amount of TSH in your blood. TSH is made by the pituitary gland. It may also be called thyrotropin. This information is not intended to replace advice given to you by your health care provider. Make sure you discuss any questions you have with your  health care provider. Document Revised: 07/03/2021 Document Reviewed: 07/03/2021 Elsevier Patient Education  [place].

## 2022-08-30 NOTE — Assessment & Plan Note (Addendum)
Well controlled, no changes to meds. Encouraged heart healthy diet such as the DASH diet and exercise as tolerated.  

## 2022-08-30 NOTE — Assessment & Plan Note (Signed)
Check tsh stable

## 2022-08-30 NOTE — Progress Notes (Signed)
Subjective:   By signing my name below, I, [name] [name], attest that this documentation has been prepared under the direction and in the presence of [name] Held, DO 08/30/22   Patient ID: [name] [name], female    DOB: 1945/07/30, 77 y.o.   MRN: [id number]  Chief Complaint  Patient presents with   Skin problem    Pt states has a hard spot on her leg. No discharge, redness, or pain.     HPI Patient is in today for an office visit. She is feeling overall well.  She complains of having 2 spots on the skin described as a "hard knot", one the right side of her face and the other on her right leg. The spot on her face has resolved.  She also endorses sudden foamy urine.   [facility] is no [facility] to [facility]. She has been walking more since her right total knee arthoplasty. She has a total knee arthoplasty scheduled for her left knee coming up within the next month.   Past Medical History:  Diagnosis Date   Allergic rhinitis    Anxiety    Arthritis    Cataract    Depression    Diabetes mellitus without complication ([place])    borderline   Emphysema lung ([place])    Early stage   Endometrial polyp    Hyperlipidemia    Hyperlipidemia    on simvastatin   Hypertension    Hypothyroidism    IBS (irritable bowel syndrome)    Obesity    Osteoarthritis    Seasonal allergies    Sleep apnea    cpap    Past Surgical History:  Procedure Laterality Date   BUNIONECTOMY  2009   Dr.Duda, left   CARPAL TUNNEL RELEASE Right 11/28/2021   Procedure: RIGHT CARPAL TUNNEL RELEASE;  Surgeon: Sherilyn Cooter, MD;  Location: [facility];  Service: Orthopedics;  Laterality: Right;   EYE SURGERY     HYSTEROSCOPY WITH D & C  04/24/2011   Procedure: DILATATION AND CURETTAGE (D&C) /HYSTEROSCOPY;  Surgeon: Eldred Manges, MD;  Location: [facility];  Service: Gynecology;  Laterality: N/A;   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   Dr Chrys Racer FASCIA SURGERY  1994    Dr Percell Miller   POLYPECTOMY  04/24/2011   Procedure: POLYPECTOMY;  Surgeon: Eldred Manges, MD;  Location: [facility];  Service: Gynecology;  Laterality: N/A;   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   Procedure: TOTAL KNEE ARTHROPLASTY;  Surgeon: Gaynelle Arabian, MD;  Location: [facility];  Service: Orthopedics;  Laterality: Right;    Family History  Problem Relation Age of Onset   Colon cancer Father 53   Cancer Father 11       colon   Alcoholism Father    Pancreatic cancer Mother 19   Cancer Mother 18       pancreatic   Colon cancer Paternal Grandmother    Cancer Paternal Grandmother 4       colon   Heart attack Maternal Grandmother 38   Heart disease Maternal Grandmother    Cancer Sister 42       melanoma   Breast cancer Sister    Cancer Brother 31       skin   Thyroid disease Sister    Breast cancer Maternal Aunt 30   Heart attack Maternal Aunt 60   Breast cancer Sister     Social History   Socioeconomic History  Marital status: Married    Spouse name: [name] [name]   Number of children: 0   Years of education: Not on file   Highest education level: Master's degree (e.g., MA, MS, MEng, MEd, MSW, MBA)  Occupational History   Occupation: Daycare p/t    Comment: retired  Tobacco Use   Smoking status: Former    Packs/day: 2.00    Years: 42.00    Total pack years: 84.00    Types: Cigarettes    Quit date: 07/28/2012    Years since quitting: 10.0   Smokeless tobacco: Never  Vaping Use   Vaping Use: Former  Substance and Sexual Activity   Alcohol use: Yes    Comment: rare   Drug use: Never   Sexual activity: Yes    Partners: Male    Birth control/protection: Post-menopausal  Other Topics Concern   Not on file  Social History Narrative   Regular exercise- no    Lives with spouse   Caffeine- 2 teas daily   Social Determinants of Health   Financial Resource Strain: Not on file  Food Insecurity: No Food Insecurity (04/01/2022)   Hunger Vital Sign    Worried  About Running Out of Food in the Last Year: Never true    [place] in the Last Year: Never true  Transportation Needs: No Transportation Needs (04/01/2022)   PRAPARE - Hydrologist (Medical): No    Lack of Transportation (Non-Medical): No  Physical Activity: Not on file  Stress: Not on file  Social Connections: Not on file  Intimate Partner Violence: Not At Risk (04/01/2022)   Humiliation, Afraid, Rape, and Kick questionnaire    Fear of Current or Ex-Partner: No    Emotionally Abused: No    Physically Abused: No    Sexually Abused: No    Outpatient Medications Prior to Visit  Medication Sig Dispense Refill   Biotin 5000 MCG TABS Take 5,000 mcg by mouth daily.     Collagen-Vitamin C-Biotin (COLLAGEN 1500/C PO) Take 1 Scoop by mouth daily.     cycloSPORINE (RESTASIS) 0.05 % ophthalmic emulsion Place 1 drop into both eyes daily.     diphenhydramine-acetaminophen (TYLENOL PM) 25-500 MG TABS tablet Take 1 tablet by mouth at bedtime as needed (pain/sleep).     levothyroxine (SYNTHROID) 125 MCG tablet TAKE 1 TABLET BY MOUTH DAILY  BEFORE BREAKFAST 90 tablet 1   loperamide (IMODIUM) 2 MG capsule TAKE 1 CAPSULE BY MOUTH  TWICE DAILY 180 capsule 1   metFORMIN (GLUCOPHAGE) 500 MG tablet Take 1 tablet (500 mg total) by mouth daily with breakfast. 90 tablet 3   Multiple Vitamins-Minerals (CENTRUM SILVER) tablet Take 1 tablet by mouth daily.     Propylene Glycol (SYSTANE COMPLETE) 0.6 % SOLN Place 1 drop into both eyes as needed (dry eyes).     sertraline (ZOLOFT) 100 MG tablet TAKE 1 TABLET BY MOUTH DAILY 90 tablet 3   simvastatin (ZOCOR) 20 MG tablet TAKE 1 TABLET BY MOUTH AT  BEDTIME (NEED OFFICE VISIT  FOR FURTHER REFILLS) 30 tablet 11   Vitamin D, Ergocalciferol, (DRISDOL) 1.25 MG (50000 UNIT) CAPS capsule TAKE 1 CAPSULE BY MOUTH  EVERY 7 DAYS (Patient taking differently: Take 50,000 Units by mouth every Wednesday.) 13 capsule 3   ALPRAZolam (XANAX) 0.25 MG  tablet Take 1 tablet (0.25 mg total) by mouth 2 (two) times daily as needed for anxiety. (Patient not taking: Reported on 03/13/2022) 20 tablet 0  COVID-19 mRNA vaccine 2023-2024 (COMIRNATY) syringe Inject into the muscle. (Patient not taking: Reported on 08/30/2022) 0.3 mL 0   gabapentin (NEURONTIN) 300 MG capsule Take 1 capsule (300 mg total) by mouth as directed. Take a 300 mg capsule three times a day for two weeks following surgery.Then take a 300 mg capsule two times a day for two weeks. Then take a 300 mg capsule once a day for two weeks. Then discontinue. 84 capsule 0   influenza vaccine adjuvanted (FLUAD QUADRIVALENT) 0.5 ML injection Inject into the muscle. (Patient not taking: Reported on 08/30/2022) 0.5 mL 0   methocarbamol (ROBAXIN) 500 MG tablet Take 1 tablet (500 mg total) by mouth every 6 (six) hours as needed for muscle spasms. 40 tablet 0   oxyCODONE (OXY IR/ROXICODONE) 5 MG immediate release tablet Take 1-2 tablets (5-10 mg total) by mouth every 6 (six) hours as needed for severe pain. 42 tablet 0   No facility-administered medications prior to visit.    Allergies  Allergen Reactions   Tape     Red, swollen, itching    Review of Systems  Constitutional:  Negative for fever and malaise/fatigue.  HENT:  Negative for congestion.   Eyes:  Negative for blurred vision.  Respiratory:  Negative for shortness of breath.   Cardiovascular:  Negative for chest pain, palpitations and leg swelling.  Gastrointestinal:  Negative for abdominal pain, blood in stool and nausea.  Genitourinary:  Negative for dysuria and frequency.       Foamy urine  Musculoskeletal:  Negative for falls.  Skin:  Negative for rash.       2 hard spots on skin (face and left thigh)  Neurological:  Negative for dizziness, loss of consciousness and headaches.  Endo/Heme/Allergies:  Negative for environmental allergies.  Psychiatric/Behavioral:  Negative for depression. The patient is not nervous/anxious.         Objective:    Physical Exam Vitals and nursing note reviewed.  Constitutional:      Appearance: She is well-developed.  HENT:     Head: Normocephalic and atraumatic.  Eyes:     Conjunctiva/sclera: Conjunctivae normal.  Neck:     Thyroid: No thyromegaly.     Vascular: No carotid bruit or JVD.  Cardiovascular:     Rate and Rhythm: Normal rate and regular rhythm.     Heart sounds: Normal heart sounds. No murmur heard. Pulmonary:     Effort: Pulmonary effort is normal. No respiratory distress.     Breath sounds: Normal breath sounds. No wheezing or rales.  Chest:     Chest wall: No tenderness.  Musculoskeletal:     Cervical back: Normal range of motion and neck supple.  Skin:    Findings: Lesion present.     Comments: Raised nodule R low leg---  1/2 cm , nontender   Neurological:     Mental Status: She is alert and oriented to person, place, and time.    BP 110/78 (BP Location: Left Arm, Patient Position: Sitting, Cuff Size: Normal)   Pulse 66   Temp (!) 97.5 F (36.4 C) (Oral)   Resp 16   Ht 4' 9"$  (1.448 m)   Wt 182 lb 12.8 oz (82.9 kg)   SpO2 97%   BMI 39.56 kg/m  Wt Readings from Last 3 Encounters:  08/30/22 182 lb 12.8 oz (82.9 kg)  04/01/22 182 lb 15.7 oz (83 kg)  03/19/22 183 lb (83 kg)       Assessment & Plan:  Type  2 diabetes mellitus with hyperglycemia, without long-term current use of insulin (HCC) -     CBC with Differential/Platelet -     Comprehensive metabolic panel -     Lipid panel -     TSH -     Hemoglobin A1c  Hyperlipidemia associated with type 2 diabetes mellitus (HCC) -     CBC with Differential/Platelet -     Comprehensive metabolic panel -     Lipid panel -     TSH -     Hemoglobin A1c  Hypothyroidism, unspecified type -     CBC with Differential/Platelet -     Comprehensive metabolic panel -     Lipid panel -     TSH -     Hemoglobin A1c  Vitamin D deficiency -     VITAMIN D 25 Hydroxy (Vit-D Deficiency,  Fractures)  Abnormal urine -     POCT Urinalysis Dipstick (Automated) -     Urine Culture     I,Rachel Rivera,acting as a scribe for [name] Held, DO.,have documented all relevant documentation on the behalf of [name] Held, DO,as directed by  [name] Held, DO while in the presence of [place], DO, personally preformed the services described in this documentation.  All medical record entries made by the scribe were at my direction and in my presence.  I have reviewed the chart and discharge instructions (if applicable) and agree that the record reflects my personal performance and is accurate and complete. 08/30/22   [name] Held, DO

## 2022-08-31 LAB — CBC WITH DIFFERENTIAL/PLATELET
Absolute Monocytes: 320 cells/uL (ref 200–950)
Basophils Absolute: 32 cells/uL (ref 0–200)
Basophils Relative: 0.7 %
Eosinophils Absolute: 59 cells/uL (ref 15–500)
Eosinophils Relative: 1.3 %
HCT: 41.6 % (ref 35.0–45.0)
Hemoglobin: 13.9 g/dL (ref 11.7–15.5)
Lymphs Abs: 1044 cells/uL (ref 850–3900)
MCH: 29.3 pg (ref 27.0–33.0)
MCHC: 33.4 g/dL (ref 32.0–36.0)
MCV: 87.6 fL (ref 80.0–100.0)
MPV: 11 fL (ref 7.5–12.5)
Monocytes Relative: 7.1 %
Neutro Abs: 3047 cells/uL (ref 1500–7800)
Neutrophils Relative %: 67.7 %
Platelets: 172 10*3/uL (ref 140–400)
RBC: 4.75 10*6/uL (ref 3.80–5.10)
RDW: 14.5 % (ref 11.0–15.0)
Total Lymphocyte: 23.2 %
WBC: 4.5 10*3/uL (ref 3.8–10.8)

## 2022-08-31 LAB — COMPREHENSIVE METABOLIC PANEL
AG Ratio: 1.8 (calc) (ref 1.0–2.5)
ALT: 11 U/L (ref 6–29)
AST: 19 U/L (ref 10–35)
Albumin: 4.3 g/dL (ref 3.6–5.1)
Alkaline phosphatase (APISO): 72 U/L (ref 37–153)
BUN/Creatinine Ratio: 42 (calc) — ABNORMAL HIGH (ref 6–22)
BUN: 24 mg/dL (ref 7–25)
CO2: 27 mmol/L (ref 20–32)
Calcium: 9.3 mg/dL (ref 8.6–10.4)
Chloride: 101 mmol/L (ref 98–110)
Creat: 0.57 mg/dL — ABNORMAL LOW (ref 0.60–1.00)
Globulin: 2.4 g/dL (calc) (ref 1.9–3.7)
Glucose, Bld: 92 mg/dL (ref 65–99)
Potassium: 4.3 mmol/L (ref 3.5–5.3)
Sodium: 140 mmol/L (ref 135–146)
Total Bilirubin: 0.5 mg/dL (ref 0.2–1.2)
Total Protein: 6.7 g/dL (ref 6.1–8.1)

## 2022-08-31 LAB — URINE CULTURE
MICRO NUMBER:: 14576229
Result:: NO GROWTH
SPECIMEN QUALITY:: ADEQUATE

## 2022-08-31 LAB — HEMOGLOBIN A1C
Hgb A1c MFr Bld: 5.8 % of total Hgb — ABNORMAL HIGH (ref <5.7)
Mean Plasma Glucose: 120 mg/dL
eAG (mmol/L): 6.6 mmol/L

## 2022-08-31 LAB — LIPID PANEL
Cholesterol: 189 mg/dL (ref <200)
HDL: 82 mg/dL (ref >=50)
LDL Cholesterol (Calc): 91 mg/dL (calc)
Non-HDL Cholesterol (Calc): 107 mg/dL (calc) (ref <130)
Total CHOL/HDL Ratio: 2.3 (calc) (ref <5.0)
Triglycerides: 74 mg/dL (ref <150)

## 2022-08-31 LAB — TSH: TSH: 1.3 mIU/L (ref 0.40–4.50)

## 2022-08-31 LAB — VITAMIN D 25 HYDROXY (VIT D DEFICIENCY, FRACTURES): Vit D, 25-Hydroxy: 83 ng/mL (ref 30–100)

## 2022-09-03 ENCOUNTER — Encounter (HOSPITAL_COMMUNITY): Payer: Self-pay

## 2022-09-03 ENCOUNTER — Other Ambulatory Visit: Payer: Self-pay

## 2022-09-03 ENCOUNTER — Encounter (HOSPITAL_COMMUNITY)
Admission: RE | Admit: 2022-09-03 | Discharge: 2022-09-03 | Disposition: A | Discharge status: Home / Self Care | Payer: 59 | Source: Ambulatory Visit | Attending: Orthopedic Surgery | Admitting: Orthopedic Surgery

## 2022-09-03 VITALS — BP 128/79 | HR 66 | Temp 98.8°F | Resp 16 | Ht 59.0 in | Wt 176.0 lb

## 2022-09-03 DIAGNOSIS — Z01812 Encounter for preprocedural laboratory examination: Secondary | ICD-10-CM | POA: Diagnosis present

## 2022-09-03 DIAGNOSIS — I10 Essential (primary) hypertension: Secondary | ICD-10-CM | POA: Diagnosis not present

## 2022-09-03 DIAGNOSIS — E88819 Insulin resistance, unspecified: Secondary | ICD-10-CM

## 2022-09-03 DIAGNOSIS — Z01818 Encounter for other preprocedural examination: Secondary | ICD-10-CM

## 2022-09-03 HISTORY — DX: Cardiac murmur, unspecified: R01.1

## 2022-09-03 HISTORY — DX: Essential (primary) hypertension: I10

## 2022-09-03 LAB — SURGICAL PCR SCREEN
MRSA, PCR: NEGATIVE
Staphylococcus aureus: NEGATIVE

## 2022-09-03 LAB — GLUCOSE, CAPILLARY: Glucose-Capillary: 115 mg/dL — ABNORMAL HIGH (ref 70–99)

## 2022-09-04 ENCOUNTER — Other Ambulatory Visit: Payer: Self-pay | Admitting: Family Medicine

## 2022-09-04 DIAGNOSIS — E785 Hyperlipidemia, unspecified: Secondary | ICD-10-CM

## 2022-09-10 ENCOUNTER — Encounter: Payer: Self-pay | Admitting: Family

## 2022-09-10 ENCOUNTER — Telehealth: Payer: Self-pay | Admitting: Family Medicine

## 2022-09-10 ENCOUNTER — Ambulatory Visit: Payer: 59 | Admitting: Family

## 2022-09-10 VITALS — BP 120/68 | HR 64 | Resp 19 | Ht 59.0 in | Wt 173.0 lb

## 2022-09-10 DIAGNOSIS — R42 Dizziness and giddiness: Secondary | ICD-10-CM | POA: Diagnosis not present

## 2022-09-10 MED ORDER — MECLIZINE HCL 25 MG PO TABS: 25.0000 mg | ORAL_TABLET | Freq: Three times a day (TID) | ORAL | 0 refills | Status: DC | PRN

## 2022-09-10 NOTE — Progress Notes (Signed)
[name] [name] is a 77 y.o. 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   Upper airway cough syndrome 06/13/2020   Class 3 severe obesity due to excess calories with serious comorbidity and body mass index (BMI) of 45.0 to 49.9 in adult ([place]) 06/13/2020   MCI (mild cognitive impairment) 06/13/2020   Insulin resistance 05/09/2020   Insomnia 05/09/2020   Vitamin D deficiency 02/17/2019   Diarrhea 02/17/2019   Essential hypertension 02/17/2019   Class 3 severe obesity due to excess calories with serious comorbidity and body mass index (BMI) of 40.0 to 44.9 in adult ([place]) 02/17/2019   Hair loss 02/17/2019   Anxiety 11/30/2017   Primary osteoarthritis of both knees 11/30/2017   Abnormal WBC count 11/30/2017   Obesity (BMI 30-39.9) 07/23/2013   Suspicious mole 02/01/2013   Palpitations 07/23/2012   Hyperlipidemia 01/28/2012   Morbid obesity ([place]) 04/24/2011   Preventative health care 02/20/2011   Skin lump of leg 10/10/2010   SORE THROAT 08/15/2010   LYMPHADENOPATHY 08/15/2010   TOBACCO USE 01/17/2010   Hyperlipidemia associated with type 2 diabetes mellitus ([place]) 04/05/2008   TINNITUS, CHRONIC, 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     Collagen-Vitamin C-Biotin (COLLAGEN 1500/C PO) Take 1 Scoop by mouth in the morning. With coffee     cycloSPORINE (RESTASIS) 0.05 % ophthalmic emulsion Place 1 drop into both eyes 2 (two) times daily.     diphenhydramine-acetaminophen (TYLENOL PM) 25-500 MG TABS tablet Take 1 tablet by mouth at bedtime as needed (pain/sleep).     Ibuprofen-diphenhydrAMINE HCl (ADVIL PM) 200-25 MG CAPS Take 1 tablet by mouth at bedtime as needed (pain/sleep).     levothyroxine (SYNTHROID) 125 MCG tablet TAKE 1 TABLET BY MOUTH DAILY  BEFORE BREAKFAST 90 tablet 1   loperamide (IMODIUM) 2 MG capsule TAKE 1 CAPSULE BY MOUTH  TWICE DAILY (Patient taking differently: Take 2 mg by mouth See admin instructions. Take 1 capsule (2 mg) by mouth scheduled every morning & may take additional dose if needed for diarrhea) 180 capsule 1   meclizine (ANTIVERT) 25 MG tablet Take 1 tablet (25 mg total) by mouth 3 (three) times daily as needed for dizziness. 20 tablet 0   metFORMIN (GLUCOPHAGE) 500 MG tablet Take 1 tablet (500 mg total) by mouth daily with breakfast. 90 tablet 3   Multiple Vitamins-Minerals (CENTRUM SILVER) tablet Take 1 tablet by mouth every evening.     Propylene Glycol (SYSTANE COMPLETE) 0.6 % SOLN Place 1 drop into both eyes as needed (dry eyes).     sertraline (ZOLOFT) 100 MG tablet TAKE 1 TABLET BY MOUTH DAILY 90  tablet 3   simvastatin (ZOCOR) 20 MG tablet TAKE 1 TABLET BY MOUTH AT  BEDTIME 90 tablet 3   Sod Fluoride-Potassium Nitrate (PREVIDENT 5000 ENAMEL PROTECT) 1.1-5 % GEL Place 1 Application onto teeth at bedtime.     Vitamin D, Ergocalciferol, (DRISDOL) 1.25 MG (50000 UNIT) CAPS capsule TAKE 1 CAPSULE BY MOUTH  EVERY 7 DAYS (Patient taking differently: Take 50,000 Units by mouth every Wednesday.) 13 capsule 3   No current facility-administered medications for this  visit.    Allergies: Tape  Past Medical History:  Diagnosis Date   Allergic rhinitis    Anxiety    Arthritis    Cataract    Depression    Diabetes mellitus without complication (HCC)    borderline   Emphysema lung (HCC)    Early stage   Endometrial polyp    Heart murmur    as a child   Hyperlipidemia    Hyperlipidemia    on simvastatin   Hypertension    Hypothyroidism    IBS (irritable bowel syndrome)    Obesity    Osteoarthritis    Seasonal allergies    Sleep apnea    no cpap    Past Surgical History:  Procedure Laterality Date   BUNIONECTOMY  2009   Dr.Duda, left   CARPAL TUNNEL RELEASE Right 11/28/2021   Procedure: RIGHT CARPAL TUNNEL RELEASE;  Surgeon: Sherilyn Cooter, MD;  Location: [facility];  Service: Orthopedics;  Laterality: Right;   EYE SURGERY     HYSTEROSCOPY WITH D & C  04/24/2011   Procedure: DILATATION AND CURETTAGE (D&C) /HYSTEROSCOPY;  Surgeon: Eldred Manges, MD;  Location: [facility];  Service: Gynecology;  Laterality: N/A;   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   Dr Chrys Racer FASCIA SURGERY  1994   Dr Percell Miller   POLYPECTOMY  04/24/2011   Procedure: POLYPECTOMY;  Surgeon: Eldred Manges, MD;  Location: [facility];  Service: Gynecology;  Laterality: N/A;   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   Procedure: TOTAL KNEE ARTHROPLASTY;  Surgeon: Gaynelle Arabian, MD;  Location: [facility];  Service: Orthopedics;  Laterality: Right;    Family History  Problem Relation Age of Onset   Colon cancer Father 16   Cancer Father 56       colon   Alcoholism Father    Pancreatic cancer Mother 51   Cancer Mother 54       pancreatic   Colon cancer Paternal Grandmother    Cancer Paternal Grandmother 58       colon   Heart attack Maternal Grandmother 2   Heart disease Maternal Grandmother    Cancer Sister 18       melanoma   Breast cancer Sister    Cancer Brother 39       skin   Thyroid disease Sister    Breast cancer Maternal Aunt 30    Heart attack Maternal Aunt 60   Breast cancer Sister     Social History   Tobacco Use   Smoking status: Former    Packs/day: 2.00    Years: 42.00    Total pack years: 84.00    Types: Cigarettes    Quit date: 07/28/2012    Years since quitting: 10.1   Smokeless tobacco: Never  Substance Use Topics   Alcohol use: Yes    Comment: rare  2-3 x a year    Subjective:   Has been having problems with vertigo/ dizziness on and off  for the past week; notes that symptoms most noticeable when she wakes up in the am- feels like room is spinning around her; no prior history of vertigo; no history of seasonal allergies; no chest pain or shortness of breath or palpitations;  Is scheduled for knee replacement surgery next week; had labs done in the past 10 days that were all normal;      Objective:  Vitals:   09/10/22 1413  BP: 120/68  Pulse: 64  Resp: 19  SpO2: 98%  Weight: 173 lb (78.5 kg)  Height: '4\' 11"'$  (1.499 m)    General: Well developed, well nourished, in no acute distress  Skin : Warm and dry.  Head: Normocephalic and atraumatic  Eyes: Sclera and conjunctiva clear; pupils round and reactive to light; extraocular movements intact  Ears: External normal; canals clear; tympanic membranes congested bilaterally Oropharynx: Pink, supple. No suspicious lesions  Neck: Supple without thyromegaly, adenopathy  Lungs: Respirations unlabored; clear to auscultation bilaterally without wheeze, rales, rhonchi  CVS exam: normal rate and regular rhythm.  Neurologic: Alert and oriented; speech intact; face symmetrical; moves all extremities well; CNII-XII intact without focal deficit   Assessment:  1. Dizziness     Plan:  Physical exam is reassuring; EKG shows sinus rhythm- no changes compared to EKG from 2019 and 2023; suspect benign vertigo- discussion regarding symptoms/ treatment/ diagnosis; will give trial of Meclizine 25 mg tid prn; follow up worse, no better.   No follow-ups on  file.  Orders Placed This Encounter  Procedures   EKG 12-Lead    Requested Prescriptions   Signed Prescriptions Disp Refills   meclizine (ANTIVERT) 25 MG tablet 20 tablet 0    Sig: Take 1 tablet (25 mg total) by mouth 3 (three) times daily as needed for dizziness.

## 2022-09-10 NOTE — Telephone Encounter (Signed)
A PA from [facility] called because the pt is experiencing dizziness and she was not sure if this would effect her medical clearance for surgery. Pt has been called and lvm to triage.

## 2022-09-10 NOTE — H&P (Signed)
TOTAL KNEE ADMISSION H&P  Patient is being admitted for left total knee arthroplasty.  Subjective:  Chief Complaint: Left knee pain.  HPI: [name] [name], 77 y.o. female has a history of pain and functional disability in the left knee due to arthritis and has failed non-surgical conservative treatments for greater than 12 weeks to include corticosteriod injections, viscosupplementation injections, use of assistive devices, and activity modification. Onset of symptoms was gradual, starting 4 years ago with gradually worsening course since that time. The patient noted no past surgery on the left knee.  Patient currently rates pain in the left knee at 8 out of 10 with activity. Patient has worsening of pain with activity and weight bearing, pain that interferes with activities of daily living, and crepitus. There is no active infection.  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   Upper airway cough syndrome 06/13/2020   Class 3 severe obesity due to excess calories with serious comorbidity and body mass index (BMI) of 45.0 to 49.9 in adult ([place]) 06/13/2020   MCI (mild cognitive impairment) 06/13/2020   Insulin resistance 05/09/2020   Insomnia 05/09/2020   Vitamin D deficiency 02/17/2019   Diarrhea 02/17/2019   Essential hypertension 02/17/2019   Class 3 severe obesity due to excess calories with serious comorbidity and body mass index (BMI) of 40.0 to 44.9 in adult ([place])  02/17/2019   Hair loss 02/17/2019   Anxiety 11/30/2017   Primary osteoarthritis of both knees 11/30/2017   Abnormal WBC count 11/30/2017   Obesity (BMI 30-39.9) 07/23/2013   Suspicious mole 02/01/2013   Palpitations 07/23/2012   Hyperlipidemia 01/28/2012   Morbid obesity ([place]) 04/24/2011   Preventative health care 02/20/2011   Skin lump of leg 10/10/2010   SORE THROAT 08/15/2010   LYMPHADENOPATHY 08/15/2010   TOBACCO USE 01/17/2010   Hyperlipidemia associated with type 2 diabetes mellitus ([place]) 04/05/2008   TINNITUS, CHRONIC, 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   ALLERGIC RHINITIS 01/25/2008   LIVER FUNCTION TESTS, ABNORMAL 01/25/2008   Hypothyroidism 01/13/2007   Depression with anxiety 01/13/2007   TRANSIENT GLOBAL AMNESIA 01/13/2007   FOOT SURGERY, HX OF 01/13/2007    Past Medical History:  Diagnosis Date   Allergic rhinitis    Anxiety    Arthritis    Cataract    Depression    Diabetes mellitus without complication ([place])    borderline   Emphysema lung (HCC)    Early stage   Endometrial polyp    Heart murmur    as a child   Hyperlipidemia    Hyperlipidemia    on simvastatin   Hypertension    Hypothyroidism    IBS (irritable bowel syndrome)    Obesity    Osteoarthritis    Seasonal allergies    Sleep apnea    no cpap    Past Surgical History:  Procedure Laterality Date   BUNIONECTOMY  2009   Dr.Duda, left   CARPAL TUNNEL RELEASE Right 11/28/2021   Procedure: RIGHT CARPAL TUNNEL RELEASE;  Surgeon: Sherilyn Cooter,  MD;  Location: [facility];  Service: Orthopedics;  Laterality: Right;   EYE SURGERY     HYSTEROSCOPY WITH D & C  04/24/2011   Procedure: DILATATION AND CURETTAGE (D&C) /HYSTEROSCOPY;  Surgeon: Eldred Manges, MD;  Location: [facility];  Service: Gynecology;  Laterality: N/A;   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 Authorizing Provider  Biotin 5000 MCG TABS Take 5,000 mcg by mouth every evening.   Yes [provider]  Collagen-Vitamin C-Biotin (COLLAGEN 1500/C PO) Take 1 Scoop by mouth in the morning. With coffee   Yes [provider]  cycloSPORINE (RESTASIS) 0.05 % ophthalmic emulsion Place 1 drop into both eyes 2 (two) times daily.   Yes [provider]  diphenhydramine-acetaminophen (TYLENOL PM) 25-500 MG TABS tablet Take 1 tablet by mouth at bedtime as needed (pain/sleep).   Yes [provider]  Ibuprofen-diphenhydrAMINE HCl (ADVIL PM) 200-25 MG CAPS Take 1 tablet by mouth at bedtime as needed (pain/sleep).   Yes [provider]  levothyroxine (SYNTHROID) 125 MCG tablet TAKE 1 TABLET BY MOUTH DAILY  BEFORE BREAKFAST 07/09/22  Yes Roma Schanz R, DO  loperamide (IMODIUM) 2 MG capsule TAKE 1 CAPSULE BY MOUTH  TWICE DAILY Patient taking differently: Take 2 mg by mouth See admin instructions. Take 1 capsule (2 mg) by mouth scheduled every morning & may take additional dose if needed for diarrhea 06/24/22  Yes Roma Schanz R, DO  metFORMIN (GLUCOPHAGE) 500 MG tablet Take 1 tablet (500 mg total) by mouth daily with breakfast. 11/02/21  Yes [name], DO  Multiple Vitamins-Minerals (CENTRUM SILVER) tablet Take 1 tablet by mouth every evening.   Yes [provider]  Propylene Glycol (SYSTANE COMPLETE) 0.6 % SOLN Place 1 drop into both eyes as needed (dry eyes).   Yes [provider]  sertraline (ZOLOFT) 100 MG tablet TAKE 1 TABLET BY MOUTH DAILY 04/22/22  Yes [name], DO  Sod Fluoride-Potassium Nitrate  (PREVIDENT 5000 ENAMEL PROTECT) 1.1-5 % GEL Place 1 Application onto teeth at bedtime.   Yes [provider]  Vitamin D, Ergocalciferol, (DRISDOL) 1.25 MG (50000 UNIT) CAPS capsule TAKE 1 CAPSULE BY MOUTH  EVERY 7 DAYS Patient taking differently: Take 50,000 Units by mouth every Wednesday. 01/14/22  Yes Roma Schanz R, DO  simvastatin (ZOCOR) 20 MG tablet TAKE 1 TABLET BY MOUTH AT  BEDTIME 09/05/22   [name], DO    Allergies  Allergen Reactions   Tape     Red, swollen, itching    Social History   Socioeconomic History   Marital status: Married    Spouse name: Allie Bent   Number of children: 0   Years of education: Not on file   Highest education level: Master's degree (e.g., MA, MS, MEng, MEd, MSW, MBA)  Occupational History   Occupation: Daycare p/t    Comment: retired  Tobacco Use   Smoking status: Former    Packs/day: 2.00  Years: 42.00    Total pack years: 84.00    Types: Cigarettes    Quit date: 07/28/2012    Years since quitting: 10.1   Smokeless tobacco: Never  Vaping Use   Vaping Use: Former  Substance and Sexual Activity   Alcohol use: Yes    Comment: rare  2-3 x a year   Drug use: Never   Sexual activity: Yes    Partners: Male    Birth control/protection: Post-menopausal  Other Topics Concern   Not on file  Social History Narrative   Regular exercise- no    Lives with spouse   Caffeine- 2 teas daily   Social Determinants of Health   Financial Resource Strain: Not on file  Food Insecurity: No Food Insecurity (04/01/2022)   Hunger Vital Sign    Worried About Running Out of Food in the Last Year: Never true    Ran Out of Food in the Last Year: Never true  Transportation Needs: No Transportation Needs (04/01/2022)   PRAPARE - Hydrologist (Medical): No    Lack of Transportation (Non-Medical): No  Physical Activity: Not on file  Stress: Not on file  Social Connections: Not on file  Intimate  Partner Violence: Not At Risk (04/01/2022)   Humiliation, Afraid, Rape, and Kick questionnaire    Fear of Current or Ex-Partner: No    Emotionally Abused: No    Physically Abused: No    Sexually Abused: No    Tobacco Use: Medium Risk (09/03/2022)   Patient History    Smoking Tobacco Use: Former    Smokeless Tobacco Use: Never    Passive Exposure: Not on file   Social History   Substance and Sexual Activity  Alcohol Use Yes   Comment: rare  2-3 x a year    Family History  Problem Relation Age of Onset   Colon cancer Father 31   Cancer Father 30       colon   Alcoholism Father    Pancreatic cancer Mother 45   Cancer Mother 31       pancreatic   Colon cancer Paternal Grandmother    Cancer Paternal Grandmother 52       colon   Heart attack Maternal Grandmother 60   Heart disease Maternal Grandmother    Cancer Sister 10       melanoma   Breast cancer Sister    Cancer Brother 69       skin   Thyroid disease Sister    Breast cancer Maternal Aunt 30   Heart attack Maternal Aunt 60   Breast cancer Sister     ROS  Objective:  Physical Exam: The patient is a pleasant, well-developed female alert and oriented in no apparent distress.    The patient has an antalgic gait pattern favoring the right side.    Right Hip Exam:  The range of motion: normal without discomfort.    Right Knee Exam:  No effusion present. No swelling present.  The range of motion is: 0 to 110 degrees.  Marked crepitus on range of motion of the knee.  Positive medial joint line tenderness.  No lateral joint line tenderness.  The knee is stable.    Left Knee Exam:  No effusion present. No swelling present.  The range of motion is: 0 to 115 degrees.  No crepitus on range of motion of the knee.  Positive medial greater than lateral joint line tenderness.  The knee is  stable.    The patient's sensation and motor function are intact in their lower extremities. Their distal pulses are 2+. The  bilateral calves are soft and non-tender.   RESULTS   - AP and lateral of the bilateral knees dated 10/2021 demonstrate bone-on-bone arthritis in the medial and patellofemoral compartments on the right and the medial compartment on the left.   Assessment/Plan:  End stage arthritis, left knee   The patient history, physical examination, clinical judgment of the provider and imaging studies are consistent with end stage degenerative joint disease of the left knee and total knee arthroplasty is deemed medically necessary. The treatment options including medical management, injection therapy arthroscopy and arthroplasty were discussed at length. The risks and benefits of total knee arthroplasty were presented and reviewed. The risks due to aseptic loosening, infection, stiffness, patella tracking problems, thromboembolic complications and other imponderables were discussed. The patient acknowledged the explanation, agreed to proceed with the plan and consent was signed. Patient is being admitted for inpatient treatment for surgery, pain control, PT, OT, prophylactic antibiotics, VTE prophylaxis, progressive ambulation and ADLs and discharge planning. The patient is planning to be discharged  home with OPPT scheduled .   Patient's anticipated LOS is less than 2 midnights, meeting these requirements: - Younger than 76 - Lives within 1 hour of care - Has a competent adult at home to recover with post-op recover - NO history of  - Chronic pain requiring opiods  - Coronary Artery Disease  - Heart failure  - Heart attack  - Stroke  - DVT/VTE  - Cardiac arrhythmia  - Respiratory Failure/COPD  - Renal failure  - Anemia  - Advanced Liver disease  Therapy Plans: [facility] Disposition: Home with Husband Planned DVT Prophylaxis: Aspirin DME Needed: None PCP: Roma Schanz, DO (clearance received) TXA: IV Allergies: adhesive tapes (blistering) Anesthesia Concerns: None BMI:  35 Last HgbA1c: 5.8 (08/30/22) Pharmacy: [facility], [place])  Other: -Pt reports new onset dizziness upon standing over the last 1-2 weeks. PCP office notified, they will reach out to patient to triage.  - Patient was instructed on what medications to stop prior to surgery. - Follow-up visit in 2 weeks with Dr. Wynelle Link - Begin physical therapy following surgery - Pre-operative lab work as pre-surgical testing - Prescriptions will be provided in hospital at time of discharge  Shearon Balo, PA-C Orthopedic Surgery [facility] [place]

## 2022-09-10 NOTE — Telephone Encounter (Signed)
Initial Comment Caller states she is experiencing dizziness and has a knee procedure coming up. Translation No Nurse Assessment Nurse: Quentin Ore, RN, April Date/Time [name] [name] Time): 09/10/2022 12:00:28 PM Confirm and document reason for call. If symptomatic, describe symptoms. ---Caller states she is experiencing dizziness this past week and it's happens when she stands up. Fine if she stands up slowly. She has a total knee procedure coming up on Monday the 4th. Does the patient have any new or worsening symptoms? ---Yes Will a triage be completed? ---Yes Related visit to physician within the last 2 weeks? ---No Does the PT have any chronic conditions? (i.e. diabetes, asthma, this includes High risk factors for pregnancy, etc.) ---Yes List chronic conditions. ---Borderline diabetic Is this a behavioral health or substance abuse call? ---No Guidelines Guideline Title Affirmed Question Affirmed Notes Nurse Date/Time (Eastern Time) Dizziness - Lightheadedness [1] Dizziness caused by heat exposure, sudden standing, or poor fluid intake AND [2] no improvement after 2 hours of rest and fluids Quentin Ore, RN, April 09/10/2022 12:04:17 PM PLEASE NOTE: All timestamps contained within this report are represented as [place] Standard Time. CONFIDENTIALTY NOTICE: This fax transmission is intended only for the addressee. It contains information that is legally privileged, confidential or otherwise protected from use or disclosure. If you are not the intended recipient, you are strictly prohibited from reviewing, disclosing, copying using or disseminating any of this information or taking any action in reliance on or regarding this information. If you have received this fax in error, please notify us immediately by telephone so that we can arrange for its return to [place]. Phone: [phone], Toll-Free: [phone], Fax: [phone] Page: 2 of 2 Call Id: [id number] [place]. Time [name] [name] Time)  Disposition Final User 09/10/2022 12:13:30 PM See HCP within 4 Hours (or PCP triage) Netta Cedars, RN, April Final Disposition 09/10/2022 12:13:30 PM See HCP within 4 Hours (or PCP triage) Yes Quentin Ore, RN, April Caller Disagree/Comply Comply Caller Understands Yes PreDisposition [place] Advice Given Per Guideline SEE HCP (OR PCP TRIAGE) WITHIN 4 HOURS: * IF OFFICE WILL BE OPEN: You need to be seen within the next 3 or 4 hours. Call your doctor (or NP/PA) now or as soon as the office opens. DRINK FLUIDS: * Drink several glasses of fruit juice, other clear fluids or water. * This will improve hydration and blood glucose. LIE DOWN AND REST: * Lie down with feet elevated for 1 hour. * This will improve circulation and increase blood flow to the brain. CALL BACK IF: * Passes out (faints) * You become worse CARE ADVICE given per Dizziness (Adult) guideline. Comments User: April, Lambert, RN Date/Time [name] [name] Time): 09/10/2022 12:18:46 PM Caller warm transferred to ofc for an appointment. Referrals REFERRED TO PCP OFFICE

## 2022-09-10 NOTE — Telephone Encounter (Signed)
Appt scheduled

## 2022-09-10 NOTE — Telephone Encounter (Signed)
FYI. Pt has appt with Mickel Baas today

## 2022-09-12 ENCOUNTER — Other Ambulatory Visit: Payer: Self-pay | Admitting: Family Medicine

## 2022-09-12 ENCOUNTER — Telehealth: Payer: Self-pay | Admitting: Family Medicine

## 2022-09-12 DIAGNOSIS — R42 Dizziness and giddiness: Secondary | ICD-10-CM

## 2022-09-12 NOTE — Telephone Encounter (Signed)
Patient called stating she was seen by LM 2/22. The medication she was prescribed for vertigo is not working & she is needing something to be fixed before her surgery.  This is the medication:   meclizine (ANTIVERT) 25 MG tablet [id number]    I let her know I would send to [facility] since they are in office today.

## 2022-09-12 NOTE — Telephone Encounter (Signed)
Pt had OV with Mickel Baas on 09/10/22 for this concern. You want her to have another visit?

## 2022-09-12 NOTE — Telephone Encounter (Signed)
Initial Comment Caller states [name] [name] she had an appointment on Tuesday for an inter ear problems. Caller states she been taking a medication but it has not worked. Caller states she has dizziness and vertigo. Translation No Nurse Assessment Nurse: Selena Batten, RN, Amy Date/Time [name] [name] Time): 09/12/2022 6:56:49 AM Confirm and document reason for call. If symptomatic, describe symptoms. ---Caller states she was seen on Tuesday for inner ear problem, was given meclizine, states the sx have improved but are still present, states she is having dizziness when she stands, no vomiting. Caller states she is needing to be reevaluated due to having an upcoming knee surgery, needs clearance from her doctor if she can have the surgery or not. Does the patient have any new or worsening symptoms? ---Yes Will a triage be completed? ---Yes Related visit to physician within the last 2 weeks? ---Yes Does the PT have any chronic conditions? (i.e. diabetes, asthma, this includes High risk factors for pregnancy, etc.) ---Yes List chronic conditions. ---diabetes Is this a behavioral health or substance abuse call? ---No Guidelines Guideline Title Affirmed Question Affirmed Notes Nurse Date/Time (Eastern Time) Dizziness - Vertigo [1] NO dizziness now AND [2] one or more stroke risk factors (i.e., hypertension, Selena Batten, RN, Amy 09/12/2022 6:59:08 AM PLEASE NOTE: All timestamps contained within this report are represented as [place] Standard Time. CONFIDENTIALTY NOTICE: This fax transmission is intended only for the addressee. It contains information that is legally privileged, confidential or otherwise protected from use or disclosure. If you are not the intended recipient, you are strictly prohibited from reviewing, disclosing, copying using or disseminating any of this information or taking any action in reliance on or regarding this information. If you have received this fax in error, please notify us  immediately by telephone so that we can arrange for its return to [place]. Phone: [phone], Toll-Free: [phone], Fax: [phone] Page: 2 of 2 Call Id: [id number] Guidelines Guideline Title Affirmed Question Affirmed Notes Nurse Date/Time [name] [name] Time) diabetes, prior stroke/ TIA/heart attack) Disp. Time [name] [name] Time) Disposition Final User 09/12/2022 7:02:37 AM See PCP within 24 Hours Yes Selena Batten, RN, Amy Final Disposition 09/12/2022 7:02:37 AM See PCP within 24 Hours Yes Selena Batten, RN, Amy Caller Disagree/Comply Comply Caller Understands Yes PreDisposition Call Doctor Care Advice Given Per Guideline SEE PCP WITHIN 24 HOURS: * IF OFFICE WILL BE OPEN: You need to be examined within the next 24 hours. Call your doctor (or NP/PA) when the office opens and make an appointment. CALL BACK IF: * You become worse CARE ADVICE given per Dizziness - Vertigo (Adult) guideline. Referrals REFERRED TO PCP OFFICE

## 2022-09-13 ENCOUNTER — Other Ambulatory Visit: Payer: Self-pay | Admitting: Family

## 2022-09-13 ENCOUNTER — Encounter: Payer: Self-pay | Admitting: Family

## 2022-09-13 DIAGNOSIS — R42 Dizziness and giddiness: Secondary | ICD-10-CM

## 2022-09-13 HISTORY — DX: Dizziness and giddiness: R42

## 2022-09-13 MED ORDER — MECLIZINE HCL 25 MG PO TABS: 25.0000 mg | ORAL_TABLET | Freq: Three times a day (TID) | ORAL | 0 refills | Status: DC | PRN

## 2022-09-13 NOTE — Telephone Encounter (Signed)
Patient notified of referral that was placed and also advised that meclizine has been sent in.

## 2022-09-13 NOTE — Telephone Encounter (Signed)
  Patient comment: Still no sign of improvement

## 2022-09-17 ENCOUNTER — Ambulatory Visit: Payer: 59 | Attending: Family

## 2022-09-17 DIAGNOSIS — R42 Dizziness and giddiness: Secondary | ICD-10-CM | POA: Insufficient documentation

## 2022-09-17 DIAGNOSIS — R262 Difficulty in walking, not elsewhere classified: Secondary | ICD-10-CM | POA: Insufficient documentation

## 2022-09-17 DIAGNOSIS — H8112 Benign paroxysmal vertigo, left ear: Secondary | ICD-10-CM | POA: Diagnosis present

## 2022-09-17 DIAGNOSIS — R2681 Unsteadiness on feet: Secondary | ICD-10-CM | POA: Diagnosis present

## 2022-09-17 NOTE — Therapy (Signed)
OUTPATIENT PHYSICAL THERAPY VESTIBULAR EVALUATION     Patient Name: [name] [name] MRN: [id number] DOB:[birth year], 77 y.o., female Today's Date: 09/17/2022  END OF SESSION:  PT End of Session - 09/17/22 1543     Visit Number 1    Date for PT Re-Evaluation 10/22/22    Authorization Type UHC    PT Start Time [id number]    PT Stop Time 1630    PT Time Calculation (min) 45 min    Activity Tolerance Patient tolerated treatment well    Behavior During Therapy WFL for tasks assessed/performed             Past Medical History:  Diagnosis Date   Allergic rhinitis    Anxiety    Arthritis    Cataract    Depression    Diabetes mellitus without complication ([facility])    borderline   Emphysema lung ([place])    Early stage   Endometrial polyp    Heart murmur    as a child   Hyperlipidemia    Hyperlipidemia    on simvastatin   Hypertension    Hypothyroidism    IBS (irritable bowel syndrome)    Obesity    Osteoarthritis    Seasonal allergies    Sleep apnea    no cpap   Past Surgical History:  Procedure Laterality Date   BUNIONECTOMY  2009   Dr.Duda, left   CARPAL TUNNEL RELEASE Right 11/28/2021   Procedure: RIGHT CARPAL TUNNEL RELEASE;  Surgeon: Sherilyn Cooter, MD;  Location: [facility];  Service: Orthopedics;  Laterality: Right;   EYE SURGERY     HYSTEROSCOPY WITH D & C  04/24/2011   Procedure: DILATATION AND CURETTAGE (D&C) /HYSTEROSCOPY;  Surgeon: Eldred Manges, MD;  Location: [facility];  Service: Gynecology;  Laterality: N/A;   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   Dr Chrys Racer FASCIA SURGERY  1994   Dr Percell Miller   POLYPECTOMY  04/24/2011   Procedure: POLYPECTOMY;  Surgeon: Eldred Manges, MD;  Location: [facility];  Service: Gynecology;  Laterality: N/A;   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   Procedure: TOTAL KNEE ARTHROPLASTY;  Surgeon: Gaynelle Arabian, MD;  Location: [facility];  Service: Orthopedics;  Laterality: Right;   Patient Active Problem  List   Diagnosis Date Noted   Osteoarthritis of right knee 04/01/2022   History of obstructive sleep apnea 02/11/2022   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 serious comorbidity with body mass index (BMI) of 37.0 to 37.9 in adult 02/11/2022   Weight loss observed on examination 02/11/2022   Pre-operative clearance 01/17/2022   Primary osteoarthritis of right knee 01/17/2022   Carpal tunnel syndrome, right upper limb 11/15/2021   Numbness 11/02/2021   OSA (obstructive sleep apnea) 07/24/2020   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/13/2020   Upper airway cough syndrome 06/13/2020   Class 3 severe obesity due to excess calories with serious comorbidity and body mass index (BMI) of 45.0 to 49.9 in adult [facility] 06/13/2020   MCI (mild cognitive impairment) 06/13/2020   Insulin resistance 05/09/2020   Insomnia 05/09/2020   Vitamin D deficiency 02/17/2019   Diarrhea 02/17/2019   Essential hypertension 02/17/2019   Class 3 severe obesity due to excess calories with serious comorbidity and body mass index (BMI) of 40.0 to 44.9 in adult ([place]) 02/17/2019   Hair loss 02/17/2019   Anxiety 11/30/2017   Primary osteoarthritis of both knees 11/30/2017   Abnormal WBC count 11/30/2017   Obesity (BMI 30-39.9)  07/23/2013   Suspicious mole 02/01/2013   Palpitations 07/23/2012   Hyperlipidemia 01/28/2012   Morbid obesity ([place]) 04/24/2011   Preventative health care 02/20/2011   Skin lump of leg 10/10/2010   SORE THROAT 08/15/2010   LYMPHADENOPATHY 08/15/2010   TOBACCO USE 01/17/2010   Hyperlipidemia associated with type 2 diabetes mellitus ([place]) 04/05/2008   TINNITUS, CHRONIC, 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   ALLERGIC RHINITIS 01/25/2008   LIVER FUNCTION TESTS, ABNORMAL 01/25/2008   Hypothyroidism 01/13/2007   Depression with anxiety 01/13/2007   TRANSIENT GLOBAL AMNESIA 01/13/2007   FOOT SURGERY, HX OF 01/13/2007    PCP: Garnet Koyanagi REFERRING PROVIDER: Jodi Mourning  REFERRING DIAG: R42- Vertigo  THERAPY DIAG:   No diagnosis found.  ONSET DATE: 09/10/22  Rationale for Evaluation and Treatment: Rehabilitation  SUBJECTIVE:   SUBJECTIVE STATEMENT: It started 2 weeks ago, it feels better than it was. When I sat up the whole room was spinning and I had to lie back down. Every time I got up I was dizzy, now I still get it sometimes but laying back is worse. I drove myself here. I was supposed to get a knee surgery last Monday but it was postponed because of the dizziness.   PERTINENT HISTORY: TKA R 2023, is scheduled for L side in April  PAIN:  Are you having pain? No  PRECAUTIONS: None  WEIGHT BEARING RESTRICTIONS: No  FALLS: Has patient fallen in last 6 months? No  LIVING ENVIRONMENT: Lives with: lives with their spouse Lives in: House/apartment Stairs: Yes: Internal: 15 steps; on right going up and External: 6 steps; on right going up Has following equipment at home: Single point cane  PLOF: Independent  PATIENT GOALS: to get rid of dizziness and get my knee surgery  OBJECTIVE:   COGNITION: Overall cognitive status: Within functional limits for tasks assessed   SENSATION: WFL   POSTURE:  rounded shoulders, forward head, and flexed trunk   Cervical ROM:    Active A/PROM (deg) eval  Flexion WFL  Extension WFL dizzy coming back to middle  Right lateral flexion WFL  Left lateral flexion Dizzy with L lateral flexion  Right rotation WNL  Left rotation WNL  (Blank rows = not tested)  GAIT: Gait pattern: step to pattern, decreased step length- Left, decreased hip/knee flexion- Left, antalgic, and wide BOS Distance walked: in clinic distances Assistive device utilized: Single point cane Level of assistance: Modified independence Comments: antalgic gait on LLE is supposed to get TKA done in a few weeks  VESTIBULAR ASSESSMENT:   SYMPTOM BEHAVIOR:  Type of dizziness: Spinning/Vertigo and Lightheadedness/Faint  Frequency: every night  Duration: no more than  20-40s  Aggravating factors: Induced by position change: sitting to laying supine  Relieving factors: head stationary, medication, and rest  Progression of symptoms: better  OCULOMOTOR EXAM:  Ocular Alignment: normal  Ocular ROM: No Limitations  Spontaneous Nystagmus: absent  Gaze-Induced Nystagmus:  slight when looking to the left  Smooth Pursuits: intact  Saccades: intact  VESTIBULAR - OCULAR REFLEX:   Slow VOR: Normal  VOR Cancellation: Normal   POSITIONAL TESTING: Right Dix-Hallpike: no nystagmus Left Dix-Hallpike: pure upbeating  MOTION SENSITIVITY:  Motion Sensitivity Quotient Intensity: 0 = none, 1 = Lightheaded, 2 = Mild, 3 = Moderate, 4 = Severe, 5 = Vomiting  Intensity  1. Sitting to supine   2. Supine to L side   3. Supine to R side   4. Supine to sitting   5. L Hallpike-Dix   6. Up from  L    7. R Hallpike-Dix   8. Up from R    9. Sitting, head tipped to L knee   10. Head up from L knee   11. Sitting, head tipped to R knee   12. Head up from R knee   13. Sitting head turns x5   14.Sitting head nods x5   15. In stance, 180 turn to L    16. In stance, 180 turn to R      VESTIBULAR TREATMENT:                                                                                                   DATE: 09/17/22  Canalith Repositioning:  Epley Left: Number of Reps: 2 and Response to Treatment: symptoms improved  PATIENT EDUCATION: Education details: POC and HEP Person educated: Patient Education method: Explanation Education comprehension: verbalized understanding  HOME EXERCISE PROGRAM: Access Code: [email or web address] URL: [email or web address] Date: 09/17/2022 Prepared by: Andris Baumann  Exercises - Self-Epley Maneuver Left Ear  - 1 x daily - 7 x weekly - 2 reps - Seated Gaze Stabilization with Head Nod  - 1 x daily - 7 x weekly - 2 sets - 10 reps - Seated Gaze Stabilization with Head Rotation  - 1 x daily - 7 x weekly - 2 sets - 10 reps - Seated  VOR Cancellation  - 1 x daily - 7 x weekly - 2 sets - 10 reps - Seated Horizontal Smooth Pursuit  - 1 x daily - 7 x weekly - 2 sets - 10 reps - Seated Vertical Smooth Pursuit  - 1 x daily - 7 x weekly - 2 sets - 10 reps  GOALS: Goals reviewed with patient? Yes  SHORT TERM GOALS: Target date: 10/08/22  Patient will be independent with initial HEP. Goal status: INITIAL  LONG TERM GOALS: Target date: 10/22/22  Patient will be independent with advanced/ongoing HEP to improve outcomes and carryover.  Goal status: INITIAL  2.  Patient will report decrease in dizziness by 75%  Goal status: INITIAL  3.  Patient will be able to lay in bed without getting dizzy. Goal status: INITIAL    ASSESSMENT:  CLINICAL IMPRESSION: Patient is a 77 y.o. female who was seen today for physical therapy evaluation and treatment for vertigo. I tested both R and L side with Dix-Hallpike and she was positive for L sided upbeating nystagmus. Went in to doing the Epley maneuver, took about 30s for it to resolve in each position but she was very dizzy sitting back up. After a few minutes we tried it again, her symptoms were very minimal the second time and improved significantly. We reviewed some exercises for her to work on and went over how to do self-epley maneuver. Patient is scheduled to get knee surgery on March 25th and would like to have her symptoms resolved within the next two weeks.   OBJECTIVE IMPAIRMENTS: dizziness.   REHAB POTENTIAL: Good  CLINICAL DECISION MAKING: Stable/uncomplicated  EVALUATION COMPLEXITY: Low   PLAN:  PT FREQUENCY: 2x/week  PT DURATION: 6 weeks  PLANNED  INTERVENTIONS: Therapeutic exercises, Therapeutic activity, Neuromuscular re-education, Balance training, Gait training, Patient/Family education, Self Care, Joint mobilization, Vestibular training, Canalith repositioning, Cryotherapy, Moist heat, and Manual therapy  PLAN FOR NEXT SESSION: reassess canalith repositioning,  continue with VOR exercises, standing on airex   [employer], PT 09/17/2022, 5:03 PM

## 2022-09-18 NOTE — Therapy (Signed)
OUTPATIENT PHYSICAL THERAPY VESTIBULAR TREATMENT     Patient Name: [name] [name] MRN: [id number] DOB:10/22/1945, 77 y.o., female Today's Date: 09/19/2022  END OF SESSION:  PT End of Session - 09/19/22 0932     Visit Number 2    Date for PT Re-Evaluation 10/22/22    Authorization Type UHC    PT Start Time 0930    PT Stop Time 1015    PT Time Calculation (min) 45 min    Activity Tolerance Patient tolerated treatment well    Behavior During Therapy WFL for tasks assessed/performed              Past Medical History:  Diagnosis Date   Allergic rhinitis    Anxiety    Arthritis    Cataract    Depression    Diabetes mellitus without complication ([facility])    borderline   Emphysema lung ([facility])    Early stage   Endometrial polyp    Heart murmur    as a child   Hyperlipidemia    Hyperlipidemia    on simvastatin   Hypertension    Hypothyroidism    IBS (irritable bowel syndrome)    Obesity    Osteoarthritis    Seasonal allergies    Sleep apnea    no cpap   Past Surgical History:  Procedure Laterality Date   BUNIONECTOMY  2009   Dr.Duda, left   CARPAL TUNNEL RELEASE Right 11/28/2021   Procedure: RIGHT CARPAL TUNNEL RELEASE;  Surgeon: Sherilyn Cooter, MD;  Location: [facility];  Service: Orthopedics;  Laterality: Right;   EYE SURGERY     HYSTEROSCOPY WITH D & C  04/24/2011   Procedure: DILATATION AND CURETTAGE (D&C) /HYSTEROSCOPY;  Surgeon: Eldred Manges, MD;  Location: [facility];  Service: Gynecology;  Laterality: N/A;   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   Dr Chrys Racer FASCIA SURGERY  1994   Dr Percell Miller   POLYPECTOMY  04/24/2011   Procedure: POLYPECTOMY;  Surgeon: Eldred Manges, MD;  Location: [facility];  Service: Gynecology;  Laterality: N/A;   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   Procedure: TOTAL KNEE ARTHROPLASTY;  Surgeon: Gaynelle Arabian, MD;  Location[facility];  Service: Orthopedics;  Laterality: Right;   Patient Active Problem  List   Diagnosis Date Noted   Osteoarthritis of right knee 04/01/2022   History of obstructive sleep apnea 02/11/2022   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 serious comorbidity with body mass index (BMI) of 37.0 to 37.9 in adult 02/11/2022   Weight loss observed on examination 02/11/2022   Pre-operative clearance 01/17/2022   Primary osteoarthritis of right knee 01/17/2022   Carpal tunnel syndrome, right upper limb 11/15/2021   Numbness 11/02/2021   OSA (obstructive sleep apnea) 07/24/2020   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/13/2020   Upper airway cough syndrome 06/13/2020   Class 3 severe obesity due to excess calories with serious comorbidity and body mass index (BMI) of 45.0 to 49.9 in adult [facility]) 06/13/2020   MCI (mild cognitive impairment) 06/13/2020   Insulin resistance 05/09/2020   Insomnia 05/09/2020   Vitamin D deficiency 02/17/2019   Diarrhea 02/17/2019   Essential hypertension 02/17/2019   Class 3 severe obesity due to excess calories with serious comorbidity and body mass index (BMI) of 40.0 to 44.9 in adult ([place]) 02/17/2019   Hair loss 02/17/2019   Anxiety 11/30/2017   Primary osteoarthritis of both knees 11/30/2017   Abnormal WBC count 11/30/2017   Obesity (BMI  30-39.9) 07/23/2013   Suspicious mole 02/01/2013   Palpitations 07/23/2012   Hyperlipidemia 01/28/2012   Morbid obesity ([place]) 04/24/2011   Preventative health care 02/20/2011   Skin lump of leg 10/10/2010   SORE THROAT 08/15/2010   LYMPHADENOPATHY 08/15/2010   TOBACCO USE 01/17/2010   Hyperlipidemia associated with type 2 diabetes mellitus ([place]) 04/05/2008   TINNITUS, CHRONIC, 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   ALLERGIC RHINITIS 01/25/2008   LIVER FUNCTION TESTS, ABNORMAL 01/25/2008   Hypothyroidism 01/13/2007   Depression with anxiety 01/13/2007   TRANSIENT GLOBAL AMNESIA 01/13/2007   FOOT SURGERY, HX OF 01/13/2007    PCP: Garnet Koyanagi REFERRING PROVIDER: Jodi Mourning  REFERRING DIAG: R42- Vertigo  THERAPY DIAG:   BPPV (benign paroxysmal positional vertigo), left  Dizziness and giddiness  Unsteadiness on feet  Difficulty in walking, not elsewhere classified  ONSET DATE: 09/10/22  Rationale for Evaluation and Treatment: Rehabilitation  SUBJECTIVE:   SUBJECTIVE STATEMENT: That night was wonderful when I went to sleep. Yesterday I got up and moving in general I was all dizzy but at the end of the day it was better. Getting up today was okay, but I am still a little dizzy.    PERTINENT HISTORY: TKA R 2023, is scheduled for L side in April  PAIN:  Are you having pain? No  PRECAUTIONS: None  WEIGHT BEARING RESTRICTIONS: No  FALLS: Has patient fallen in last 6 months? No  LIVING ENVIRONMENT: Lives with: lives with their spouse Lives in: House/apartment Stairs: Yes: Internal: 15 steps; on right going up and External: 6 steps; on right going up Has following equipment at home: Single point cane  PLOF: Independent  PATIENT GOALS: to get rid of dizziness and get my knee surgery  OBJECTIVE:   COGNITION: Overall cognitive status: Within functional limits for tasks assessed   SENSATION: WFL   POSTURE:  rounded shoulders, forward head, and flexed trunk   Cervical ROM:    Active A/PROM (deg) eval  Flexion WFL  Extension WFL dizzy coming back to middle  Right lateral flexion WFL  Left lateral flexion Dizzy with L lateral flexion  Right rotation WNL  Left rotation WNL  (Blank rows = not tested)  GAIT: Gait pattern: step to pattern, decreased step length- Left, decreased hip/knee flexion- Left, antalgic, and wide BOS Distance walked: in clinic distances Assistive device utilized: Single point cane Level of assistance: Modified independence Comments: antalgic gait on LLE is supposed to get TKA done in a few weeks  VESTIBULAR ASSESSMENT:   SYMPTOM BEHAVIOR:  Type of dizziness: Spinning/Vertigo and Lightheadedness/Faint  Frequency: every night  Duration: no more than  20-40s  Aggravating factors: Induced by position change: sitting to laying supine  Relieving factors: head stationary, medication, and rest  Progression of symptoms: better  OCULOMOTOR EXAM:  Ocular Alignment: normal  Ocular ROM: No Limitations  Spontaneous Nystagmus: absent  Gaze-Induced Nystagmus:  slight when looking to the left  Smooth Pursuits: intact  Saccades: intact  VESTIBULAR - OCULAR REFLEX:   Slow VOR: Normal  VOR Cancellation: Normal   POSITIONAL TESTING: Right Dix-Hallpike: no nystagmus Left Dix-Hallpike: pure upbeating  MOTION SENSITIVITY:  Motion Sensitivity Quotient Intensity: 0 = none, 1 = Lightheaded, 2 = Mild, 3 = Moderate, 4 = Severe, 5 = Vomiting  Intensity  1. Sitting to supine   2. Supine to L side   3. Supine to R side   4. Supine to sitting   5. L Hallpike-Dix   6. Up from L    7.  R Hallpike-Dix   8. Up from R    9. Sitting, head tipped to L knee   10. Head up from L knee   11. Sitting, head tipped to R knee   12. Head up from R knee   13. Sitting head turns x5   14.Sitting head nods x5   15. In stance, 180 turn to L    16. In stance, 180 turn to R      VESTIBULAR TREATMENT:                                                                                                   DATE:  09/19/22 Canalith Repositioning: Epley Left: Number of Reps: 1 and Response to Treatment: symptoms improved but lingering dizziness when she sat back up Brandt-Daroff reps x3 Smooth pursuits horizontal and vertical 20 reps  VOR x1 unable to maintain gaze after 7 reps VOR x2   09/17/22  Canalith Repositioning:  Epley Left: Number of Reps: 2 and Response to Treatment: symptoms improved  PATIENT EDUCATION: Education details: POC and HEP Person educated: Patient Education method: Explanation Education comprehension: verbalized understanding  HOME EXERCISE PROGRAM: Access Code: [email or web address] URL: [email or web address] Date: 09/17/2022 Prepared by:  Andris Baumann  Exercises - Self-Epley Maneuver Left Ear  - 1 x daily - 7 x weekly - 2 reps - Seated Gaze Stabilization with Head Nod  - 1 x daily - 7 x weekly - 2 sets - 10 reps - Seated Gaze Stabilization with Head Rotation  - 1 x daily - 7 x weekly - 2 sets - 10 reps - Seated VOR Cancellation  - 1 x daily - 7 x weekly - 2 sets - 10 reps - Seated Horizontal Smooth Pursuit  - 1 x daily - 7 x weekly - 2 sets - 10 reps - Seated Vertical Smooth Pursuit  - 1 x daily - 7 x weekly - 2 sets - 10 reps  GOALS: Goals reviewed with patient? Yes  SHORT TERM GOALS: Target date: 10/08/22  Patient will be independent with initial HEP. Goal status: INITIAL  LONG TERM GOALS: Target date: 10/22/22  Patient will be independent with advanced/ongoing HEP to improve outcomes and carryover.  Goal status: INITIAL  2.  Patient will report decrease in dizziness by 75%  Goal status: INITIAL  3.  Patient will be able to lay in bed without getting dizzy. Goal status: INITIAL    ASSESSMENT:  CLINICAL IMPRESSION: Patients returns with improved symptoms of BPPV but still having some lingering effects of dizziness. We did the Epley again for which she had some dizziness but nothing liek the first time and no nystagmus. However when she sat back up she had ongoing dizziness for over 3 mins. So we tried some Brandt-Daroff exercises for 3 reps. Does well with this with minimal dizziness but still continues to feel it when sitting in the middle. Was advised to try these at home instead of Epley because she states she did not feel she was getting the same effects of me doing them. Patient has scheduled her knee surgery for  10/07/22.   OBJECTIVE IMPAIRMENTS: dizziness.   REHAB POTENTIAL: Good  CLINICAL DECISION MAKING: Stable/uncomplicated  EVALUATION COMPLEXITY: Low   PLAN:  PT FREQUENCY: 2x/week  PT DURATION: 6 weeks  PLANNED INTERVENTIONS: Therapeutic exercises, Therapeutic activity, Neuromuscular  re-education, Balance training, Gait training, Patient/Family education, Self Care, Joint mobilization, Vestibular training, Canalith repositioning, Cryotherapy, Moist heat, and Manual therapy  PLAN FOR NEXT SESSION: reassess how BD exercises are going, continue with VOR exercises, standing on airex, walking with head turns   [employer], PT 09/19/2022, 10:16 AM

## 2022-09-19 ENCOUNTER — Ambulatory Visit: Payer: 59

## 2022-09-19 ENCOUNTER — Ambulatory Visit: Payer: 59 | Admitting: Physical Therapy

## 2022-09-19 DIAGNOSIS — R42 Dizziness and giddiness: Secondary | ICD-10-CM

## 2022-09-19 DIAGNOSIS — R2681 Unsteadiness on feet: Secondary | ICD-10-CM

## 2022-09-19 DIAGNOSIS — H8112 Benign paroxysmal vertigo, left ear: Secondary | ICD-10-CM

## 2022-09-19 DIAGNOSIS — R262 Difficulty in walking, not elsewhere classified: Secondary | ICD-10-CM

## 2022-09-20 ENCOUNTER — Ambulatory Visit: Payer: 59 | Admitting: Physical Therapy

## 2022-09-23 ENCOUNTER — Ambulatory Visit: Payer: 59 | Admitting: Physical Therapy

## 2022-09-24 NOTE — Therapy (Signed)
OUTPATIENT PHYSICAL THERAPY VESTIBULAR TREATMENT     Patient Name: [name] [name] MRN: [id number] DOB:February 09, 1946, 77 y.o., female Today's Date: 09/19/2022  END OF SESSION:  PT End of Session - 09/19/22 0932     Visit Number 2    Date for PT Re-Evaluation 10/22/22    Authorization Type U[employer]    PT Start Time 0930    PT Stop Time 1015    PT Time Calculation (min) 45 min    Activity Tolerance Patient tolerated treatment well    Behavior During Therapy WFL for tasks assessed/performed              Past Medical History:  Diagnosis Date   Allergic rhinitis    Anxiety    Arthritis    Cataract    Depression    Diabetes mellitus without complication (HCC)    borderline   Emphysema lung (HCC)    Early stage   Endometrial polyp    Heart murmur    as a child   Hyperlipidemia    Hyperlipidemia    on simvastatin   Hypertension    Hypothyroidism    IBS (irritable bowel syndrome)    Obesity    Osteoarthritis    Seasonal allergies    Sleep apnea    no cpap   Past Surgical History:  Procedure Laterality Date   BUNIONECTOMY  2009   Dr.Duda, left   CARPAL TUNNEL RELEASE Right 11/28/2021   Procedure: RIGHT CARPAL TUNNEL RELEASE;  Surgeon: Sherilyn Cooter, MD;  Location: [facility];  Service: Orthopedics;  Laterality: Right;   EYE SURGERY     HYSTEROSCOPY WITH D & C  04/24/2011   Procedure: DILATATION AND CURETTAGE (D&C) /HYSTEROSCOPY;  Surgeon: Eldred Manges, MD;  Location: [facility];  Service: Gynecology;  Laterality: N/A;   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   Dr Chrys Racer FASCIA SURGERY  1994   Dr Percell Miller   POLYPECTOMY  04/24/2011   Procedure: POLYPECTOMY;  Surgeon: Eldred Manges, MD;  Location: [facility];  Service: Gynecology;  Laterality: N/A;   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   Procedure: TOTAL KNEE ARTHROPLASTY;  Surgeon: Gaynelle Arabian, MD;  Location: [facility];  Service: Orthopedics;  Laterality: Right;   Patient Active Problem  List   Diagnosis Date Noted   Osteoarthritis of right knee 04/01/2022   History of obstructive sleep apnea 02/11/2022   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 serious comorbidity with body mass index (BMI) of 37.0 to 37.9 in adult 02/11/2022   Weight loss observed on examination 02/11/2022   Pre-operative clearance 01/17/2022   Primary osteoarthritis of right knee 01/17/2022   Carpal tunnel syndrome, right upper limb 11/15/2021   Numbness 11/02/2021   OSA (obstructive sleep apnea) 07/24/2020   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/13/2020   Upper airway cough syndrome 06/13/2020   Class 3 severe obesity due to excess calories with serious comorbidity and body mass index (BMI) of 45.0 to 49.9 in adult [facility]) 06/13/2020   MCI (mild cognitive impairment) 06/13/2020   Insulin resistance 05/09/2020   Insomnia 05/09/2020   Vitamin D deficiency 02/17/2019   Diarrhea 02/17/2019   Essential hypertension 02/17/2019   Class 3 severe obesity due to excess calories with serious comorbidity and body mass index (BMI) of 40.0 to 44.9 in adult ([place]) 02/17/2019   Hair loss 02/17/2019   Anxiety 11/30/2017   Primary osteoarthritis of both knees 11/30/2017   Abnormal WBC count 11/30/2017   Obesity (BMI  30-39.9) 07/23/2013   Suspicious mole 02/01/2013   Palpitations 07/23/2012   Hyperlipidemia 01/28/2012   Morbid obesity ([place]) 04/24/2011   Preventative health care 02/20/2011   Skin lump of leg 10/10/2010   SORE THROAT 08/15/2010   LYMPHADENOPATHY 08/15/2010   TOBACCO USE 01/17/2010   Hyperlipidemia associated with type 2 diabetes mellitus ([place]) 04/05/2008   TINNITUS, CHRONIC, 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   ALLERGIC RHINITIS 01/25/2008   LIVER FUNCTION TESTS, ABNORMAL 01/25/2008   Hypothyroidism 01/13/2007   Depression with anxiety 01/13/2007   TRANSIENT GLOBAL AMNESIA 01/13/2007   FOOT SURGERY, HX OF 01/13/2007    PCP: Garnet Koyanagi REFERRING PROVIDER: Jodi Mourning  REFERRING DIAG: R42- Vertigo  THERAPY DIAG:   BPPV (benign paroxysmal positional vertigo), left  Dizziness and giddiness  Unsteadiness on feet  Difficulty in walking, not elsewhere classified  ONSET DATE: 09/10/22  Rationale for Evaluation and Treatment: Rehabilitation  SUBJECTIVE:   SUBJECTIVE STATEMENT: That night was wonderful when I went to sleep. Yesterday I got up and moving in general I was all dizzy but at the end of the day it was better. Getting up today was okay, but I am still a little dizzy.    PERTINENT HISTORY: TKA R 2023, is scheduled for L side in April  PAIN:  Are you having pain? No  PRECAUTIONS: None  WEIGHT BEARING RESTRICTIONS: No  FALLS: Has patient fallen in last 6 months? No  LIVING ENVIRONMENT: Lives with: lives with their spouse Lives in: House/apartment Stairs: Yes: Internal: 15 steps; on right going up and External: 6 steps; on right going up Has following equipment at home: Single point cane  PLOF: Independent  PATIENT GOALS: to get rid of dizziness and get my knee surgery  OBJECTIVE:   COGNITION: Overall cognitive status: Within functional limits for tasks assessed   SENSATION: WFL   POSTURE:  rounded shoulders, forward head, and flexed trunk   Cervical ROM:    Active A/PROM (deg) eval  Flexion WFL  Extension WFL dizzy coming back to middle  Right lateral flexion WFL  Left lateral flexion Dizzy with L lateral flexion  Right rotation WNL  Left rotation WNL  (Blank rows = not tested)  GAIT: Gait pattern: step to pattern, decreased step length- Left, decreased hip/knee flexion- Left, antalgic, and wide BOS Distance walked: in clinic distances Assistive device utilized: Single point cane Level of assistance: Modified independence Comments: antalgic gait on LLE is supposed to get TKA done in a few weeks  VESTIBULAR ASSESSMENT:   SYMPTOM BEHAVIOR:  Type of dizziness: Spinning/Vertigo and Lightheadedness/Faint  Frequency: every night  Duration: no more than  20-40s  Aggravating factors: Induced by position change: sitting to laying supine  Relieving factors: head stationary, medication, and rest  Progression of symptoms: better  OCULOMOTOR EXAM:  Ocular Alignment: normal  Ocular ROM: No Limitations  Spontaneous Nystagmus: absent  Gaze-Induced Nystagmus:  slight when looking to the left  Smooth Pursuits: intact  Saccades: intact  VESTIBULAR - OCULAR REFLEX:   Slow VOR: Normal  VOR Cancellation: Normal   POSITIONAL TESTING: Right Dix-Hallpike: no nystagmus Left Dix-Hallpike: pure upbeating  MOTION SENSITIVITY:  Motion Sensitivity Quotient Intensity: 0 = none, 1 = Lightheaded, 2 = Mild, 3 = Moderate, 4 = Severe, 5 = Vomiting  Intensity  1. Sitting to supine   2. Supine to L side   3. Supine to R side   4. Supine to sitting   5. L Hallpike-Dix   6. Up from L    7.  R Hallpike-Dix   8. Up from R    9. Sitting, head tipped to L knee   10. Head up from L knee   11. Sitting, head tipped to R knee   12. Head up from R knee   13. Sitting head turns x5   14.Sitting head nods x5   15. In stance, 180 turn to L    16. In stance, 180 turn to R      VESTIBULAR TREATMENT:                                                                                                   DATE:  09/25/22 Reassess canalith repositioning VOR x1 VOR x2 Standing on airex 30s, feet together, then EC Head turns on airex VOR on airex  Walking with head turns   09/19/22 Canalith Repositioning: Epley Left: Number of Reps: 1 and Response to Treatment: symptoms improved but lingering dizziness when she sat back up Brandt-Daroff reps x3 Smooth pursuits horizontal and vertical 20 reps  VOR x1 unable to maintain gaze after 7 reps VOR x2   09/17/22  Canalith Repositioning:  Epley Left: Number of Reps: 2 and Response to Treatment: symptoms improved  PATIENT EDUCATION: Education details: POC and HEP Person educated: Patient Education method:  Explanation Education comprehension: verbalized understanding  HOME EXERCISE PROGRAM: Access Code: [email or web address] URL: [email or web address] Date: 09/17/2022 Prepared by: Andris Baumann  Exercises - Self-Epley Maneuver Left Ear  - 1 x daily - 7 x weekly - 2 reps - Seated Gaze Stabilization with Head Nod  - 1 x daily - 7 x weekly - 2 sets - 10 reps - Seated Gaze Stabilization with Head Rotation  - 1 x daily - 7 x weekly - 2 sets - 10 reps - Seated VOR Cancellation  - 1 x daily - 7 x weekly - 2 sets - 10 reps - Seated Horizontal Smooth Pursuit  - 1 x daily - 7 x weekly - 2 sets - 10 reps - Seated Vertical Smooth Pursuit  - 1 x daily - 7 x weekly - 2 sets - 10 reps  GOALS: Goals reviewed with patient? Yes  SHORT TERM GOALS: Target date: 10/08/22  Patient will be independent with initial HEP. Goal status: INITIAL  LONG TERM GOALS: Target date: 10/22/22  Patient will be independent with advanced/ongoing HEP to improve outcomes and carryover.  Goal status: INITIAL  2.  Patient will report decrease in dizziness by 75%  Goal status: INITIAL  3.  Patient will be able to lay in bed without getting dizzy. Goal status: INITIAL    ASSESSMENT:  CLINICAL IMPRESSION: Patients returns with improved symptoms of BPPV but still having some lingering effects of dizziness. We did the Epley again for which she had some dizziness but nothing liek the first time and no nystagmus. However when she sat back up she had ongoing dizziness for over 3 mins. So we tried some Brandt-Daroff exercises for 3 reps. Does well with this with minimal dizziness but still continues to feel it when sitting in the middle. Was advised to try  these at home instead of Epley because she states she did not feel she was getting the same effects of me doing them. Patient has scheduled her knee surgery for 10/07/22.   OBJECTIVE IMPAIRMENTS: dizziness.   REHAB POTENTIAL: Good  CLINICAL DECISION MAKING:  Stable/uncomplicated  EVALUATION COMPLEXITY: Low   PLAN:  PT FREQUENCY: 2x/week  PT DURATION: 6 weeks  PLANNED INTERVENTIONS: Therapeutic exercises, Therapeutic activity, Neuromuscular re-education, Balance training, Gait training, Patient/Family education, Self Care, Joint mobilization, Vestibular training, Canalith repositioning, Cryotherapy, Moist heat, and Manual therapy  PLAN FOR NEXT SESSION: reassess how BD exercises are going, continue with VOR exercises, standing on airex, walking with head turns   [employer], PT 09/19/2022, 10:16 AM

## 2022-09-25 ENCOUNTER — Ambulatory Visit: Payer: 59

## 2022-09-25 ENCOUNTER — Ambulatory Visit: Payer: 59 | Admitting: Physical Therapy

## 2022-09-25 DIAGNOSIS — R262 Difficulty in walking, not elsewhere classified: Secondary | ICD-10-CM

## 2022-09-25 DIAGNOSIS — R2681 Unsteadiness on feet: Secondary | ICD-10-CM

## 2022-09-25 DIAGNOSIS — H8112 Benign paroxysmal vertigo, left ear: Secondary | ICD-10-CM

## 2022-09-25 DIAGNOSIS — R42 Dizziness and giddiness: Secondary | ICD-10-CM

## 2022-09-26 NOTE — Therapy (Signed)
OUTPATIENT PHYSICAL THERAPY VESTIBULAR TREATMENT     Patient Name: [name] [name] MRN: [id number] DOB:[birth year], 77 y.o., female Today's Date: 09/25/2022  END OF SESSION:  PT End of Session - 09/25/22 1531     Visit Number 3    Date for PT Re-Evaluation 10/22/22    Authorization Type UHC    PT Start Time 1530    PT Stop Time 1615    PT Time Calculation (min) 45 min    Activity Tolerance Patient tolerated treatment well    Behavior During Therapy WFL for tasks assessed/performed              Past Medical History:  Diagnosis Date   Allergic rhinitis    Anxiety    Arthritis    Cataract    Depression    Diabetes mellitus without complication ([place])    borderline   Emphysema lung ([place])    Early stage   Endometrial polyp    Heart murmur    as a child   Hyperlipidemia    Hyperlipidemia    on simvastatin   Hypertension    Hypothyroidism    IBS (irritable bowel syndrome)    Obesity    Osteoarthritis    Seasonal allergies    Sleep apnea    no cpap   Past Surgical History:  Procedure Laterality Date   BUNIONECTOMY  2009   Dr.Duda, left   CARPAL TUNNEL RELEASE Right 11/28/2021   Procedure: RIGHT CARPAL TUNNEL RELEASE;  Surgeon: Sherilyn Cooter, MD;  Location: [facility];  Service: Orthopedics;  Laterality: Right;   EYE SURGERY     HYSTEROSCOPY WITH D & C  04/24/2011   Procedure: DILATATION AND CURETTAGE (D&C) /HYSTEROSCOPY;  Surgeon: Eldred Manges, MD;  Location: [facility];  Service: Gynecology;  Laterality: N/A;   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   Dr Chrys Racer FASCIA SURGERY  1994   Dr Percell Miller   POLYPECTOMY  04/24/2011   Procedure: POLYPECTOMY;  Surgeon: Eldred Manges, MD;  Location: [facility];  Service: Gynecology;  Laterality: N/A;   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   Procedure: TOTAL KNEE ARTHROPLASTY;  Surgeon: Gaynelle Arabian, MD;  Location: [facility];  Service: Orthopedics;  Laterality: Right;   Patient Active Problem  List   Diagnosis Date Noted   Osteoarthritis of right knee 04/01/2022   History of obstructive sleep apnea 02/11/2022   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 serious comorbidity with body mass index (BMI) of 37.0 to 37.9 in adult 02/11/2022   Weight loss observed on examination 02/11/2022   Pre-operative clearance 01/17/2022   Primary osteoarthritis of right knee 01/17/2022   Carpal tunnel syndrome, right upper limb 11/15/2021   Numbness 11/02/2021   OSA (obstructive sleep apnea) 07/24/2020   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/13/2020   Upper airway cough syndrome 06/13/2020   Class 3 severe obesity due to excess calories with serious comorbidity and body mass index (BMI) of 45.0 to 49.9 in adult [facility]) 06/13/2020   MCI (mild cognitive impairment) 06/13/2020   Insulin resistance 05/09/2020   Insomnia 05/09/2020   Vitamin D deficiency 02/17/2019   Diarrhea 02/17/2019   Essential hypertension 02/17/2019   Class 3 severe obesity due to excess calories with serious comorbidity and body mass index (BMI) of 40.0 to 44.9 in adult ([place]) 02/17/2019   Hair loss 02/17/2019   Anxiety 11/30/2017   Primary osteoarthritis of both knees 11/30/2017   Abnormal WBC count 11/30/2017   Obesity (BMI  30-39.9) 07/23/2013   Suspicious mole 02/01/2013   Palpitations 07/23/2012   Hyperlipidemia 01/28/2012   Morbid obesity ([place]) 04/24/2011   Preventative health care 02/20/2011   Skin lump of leg 10/10/2010   SORE THROAT 08/15/2010   LYMPHADENOPATHY 08/15/2010   TOBACCO USE 01/17/2010   Hyperlipidemia associated with type 2 diabetes mellitus ([place]) 04/05/2008   TINNITUS, CHRONIC, 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   ALLERGIC RHINITIS 01/25/2008   LIVER FUNCTION TESTS, ABNORMAL 01/25/2008   Hypothyroidism 01/13/2007   Depression with anxiety 01/13/2007   TRANSIENT GLOBAL AMNESIA 01/13/2007   FOOT SURGERY, HX OF 01/13/2007    PCP: Garnet Koyanagi REFERRING PROVIDER: Jodi Mourning  REFERRING DIAG: R42- Vertigo  THERAPY DIAG:   BPPV (benign paroxysmal positional vertigo), left  Dizziness and giddiness  Unsteadiness on feet  Difficulty in walking, not elsewhere classified  ONSET DATE: 09/10/22  Rationale for Evaluation and Treatment: Rehabilitation  SUBJECTIVE:   SUBJECTIVE STATEMENT: Dizziness is doing very well. Right now it is a 0/10  PERTINENT HISTORY: TKA R 2023, is scheduled for L side in April  PAIN:  Are you having pain? No  PRECAUTIONS: None  WEIGHT BEARING RESTRICTIONS: No  FALLS: Has patient fallen in last 6 months? No  LIVING ENVIRONMENT: Lives with: lives with their spouse Lives in: House/apartment Stairs: Yes: Internal: 15 steps; on right going up and External: 6 steps; on right going up Has following equipment at home: Single point cane  PLOF: Independent  PATIENT GOALS: to get rid of dizziness and get my knee surgery  OBJECTIVE:   COGNITION: Overall cognitive status: Within functional limits for tasks assessed   SENSATION: WFL   POSTURE:  rounded shoulders, forward head, and flexed trunk   Cervical ROM:    Active A/PROM (deg) eval  Flexion WFL  Extension WFL dizzy coming back to middle  Right lateral flexion WFL  Left lateral flexion Dizzy with L lateral flexion  Right rotation WNL  Left rotation WNL  (Blank rows = not tested)  GAIT: Gait pattern: step to pattern, decreased step length- Left, decreased hip/knee flexion- Left, antalgic, and wide BOS Distance walked: in clinic distances Assistive device utilized: Single point cane Level of assistance: Modified independence Comments: antalgic gait on LLE is supposed to get TKA done in a few weeks  VESTIBULAR ASSESSMENT:   SYMPTOM BEHAVIOR:  Type of dizziness: Spinning/Vertigo and Lightheadedness/Faint  Frequency: every night  Duration: no more than 20-40s  Aggravating factors: Induced by position change: sitting to laying supine  Relieving factors: head stationary, medication, and rest  Progression of  symptoms: better  OCULOMOTOR EXAM:  Ocular Alignment: normal  Ocular ROM: No Limitations  Spontaneous Nystagmus: absent  Gaze-Induced Nystagmus:  slight when looking to the left  Smooth Pursuits: intact  Saccades: intact  VESTIBULAR - OCULAR REFLEX:   Slow VOR: Normal  VOR Cancellation: Normal   POSITIONAL TESTING: Right Dix-Hallpike: no nystagmus Left Dix-Hallpike: pure upbeating  MOTION SENSITIVITY:  Motion Sensitivity Quotient Intensity: 0 = none, 1 = Lightheaded, 2 = Mild, 3 = Moderate, 4 = Severe, 5 = Vomiting  Intensity  1. Sitting to supine   2. Supine to L side   3. Supine to R side   4. Supine to sitting   5. L Hallpike-Dix   6. Up from L    7. R Hallpike-Dix   8. Up from R    9. Sitting, head tipped to L knee   10. Head up from L knee   11. Sitting, head tipped to R  knee   12. Head up from R knee   13. Sitting head turns x5   14.Sitting head nods x5   15. In stance, 180 turn to L    16. In stance, 180 turn to R      VESTIBULAR TREATMENT:                                                                                                   DATE:  09/27/22 Nestor Lewandowsky review VOR x1 on airex 20 reps horizontal and vertical Standing on airex eyes closed  VOR x2 on airex 20 reps Picking up cones standing on airex Marching on airex 20 reps  Walking beam  Walking with head turns forwards and backwards Walking with head turns at target  Calf stretch on slant 30s  NuStep L4 x42mins    09/25/22 Canalith Repositioning: Epley Left: Number of Reps: 2 Response to Treatment: symptoms improved Reviewed Brandt-Daroff exercises as she was doing them incorrectly VOR x1 20 reps  VOR x2 20 reps  Standing on airex 30s, feet together, then EC Head turns on airex VOR on airex horizontal and vertical Walking with head turns and walking backwards   09/19/22 Canalith Repositioning: Epley Left: Number of Reps: 1 and Response to Treatment: symptoms improved but lingering  dizziness when she sat back up Brandt-Daroff reps x3 Smooth pursuits horizontal and vertical 20 reps  VOR x1 unable to maintain gaze after 7 reps VOR x2   09/17/22  Canalith Repositioning:  Epley Left: Number of Reps: 2 and Response to Treatment: symptoms improved  PATIENT EDUCATION: Education details: POC and HEP Person educated: Patient Education method: Explanation Education comprehension: verbalized understanding  HOME EXERCISE PROGRAM: Access Code: [email or web address] URL: [email or web address] Date: 09/17/2022 Prepared by: Andris Baumann  Exercises - Self-Epley Maneuver Left Ear  - 1 x daily - 7 x weekly - 2 reps - Seated Gaze Stabilization with Head Nod  - 1 x daily - 7 x weekly - 2 sets - 10 reps - Seated Gaze Stabilization with Head Rotation  - 1 x daily - 7 x weekly - 2 sets - 10 reps - Seated VOR Cancellation  - 1 x daily - 7 x weekly - 2 sets - 10 reps - Seated Horizontal Smooth Pursuit  - 1 x daily - 7 x weekly - 2 sets - 10 reps - Seated Vertical Smooth Pursuit  - 1 x daily - 7 x weekly - 2 sets - 10 reps  GOALS: Goals reviewed with patient? Yes  SHORT TERM GOALS: Target date: 10/08/22  Patient will be independent with initial HEP. Goal status: INITIAL  LONG TERM GOALS: Target date: 10/22/22  Patient will be independent with advanced/ongoing HEP to improve outcomes and carryover.  Goal status: INITIAL  2.  Patient will report decrease in dizziness by 75%  Goal status: INITIAL  3.  Patient will be able to lay in bed without getting dizzy. Goal status: INITIAL    ASSESSMENT:  CLINICAL IMPRESSION: Patient states she is doing the Brandt-Daroff exercises, and it has made a good improvement. She is not  getting as dizzy doing them anymore now that she is doing them correctly. We focused more on balance and VOR exercises today. She does really well with all intervention with no c/o of dizziness. Some unsteadiness with walking backwards.  OBJECTIVE IMPAIRMENTS:  dizziness.   REHAB POTENTIAL: Good  CLINICAL DECISION MAKING: Stable/uncomplicated  EVALUATION COMPLEXITY: Low   PLAN:  PT FREQUENCY: 2x/week  PT DURATION: 6 weeks  PLANNED INTERVENTIONS: Therapeutic exercises, Therapeutic activity, Neuromuscular re-education, Balance training, Gait training, Patient/Family education, Self Care, Joint mobilization, Vestibular training, Canalith repositioning, Cryotherapy, Moist heat, and Manual therapy  PLAN FOR NEXT SESSION: reassess how BD exercises are going, continue with VOR exercises, standing on airex, walking with head turns   [employer], PT 09/25/2022, 4:16 PM

## 2022-09-27 ENCOUNTER — Ambulatory Visit: Payer: 59

## 2022-09-27 DIAGNOSIS — R2681 Unsteadiness on feet: Secondary | ICD-10-CM

## 2022-09-27 DIAGNOSIS — R42 Dizziness and giddiness: Secondary | ICD-10-CM

## 2022-09-27 DIAGNOSIS — H8112 Benign paroxysmal vertigo, left ear: Secondary | ICD-10-CM

## 2022-09-30 ENCOUNTER — Ambulatory Visit: Payer: 59 | Admitting: Physical Therapy

## 2022-09-30 ENCOUNTER — Ambulatory Visit: Payer: 59

## 2022-09-30 DIAGNOSIS — R2681 Unsteadiness on feet: Secondary | ICD-10-CM

## 2022-09-30 DIAGNOSIS — H8112 Benign paroxysmal vertigo, left ear: Secondary | ICD-10-CM | POA: Diagnosis not present

## 2022-09-30 DIAGNOSIS — R42 Dizziness and giddiness: Secondary | ICD-10-CM

## 2022-09-30 DIAGNOSIS — R262 Difficulty in walking, not elsewhere classified: Secondary | ICD-10-CM

## 2022-09-30 NOTE — Therapy (Signed)
OUTPATIENT PHYSICAL THERAPY VESTIBULAR TREATMENT     Patient Name: MAYREN REILLEY MRN: [id number] DOB:Jan 02, 1946, 77 y.o., female Today's Date: 09/30/2022  END OF SESSION:  PT End of Session - 09/30/22 1531     Visit Number 5    Date for PT Re-Evaluation 10/22/22    Authorization Type UHC    PT Start Time [id number]    PT Stop Time 1615    PT Time Calculation (min) 45 min    Activity Tolerance Patient tolerated treatment well    Behavior During Therapy WFL for tasks assessed/performed              Past Medical History:  Diagnosis Date   Allergic rhinitis    Anxiety    Arthritis    Cataract    Depression    Diabetes mellitus without complication ([place])    borderline   Emphysema lung ([place])    Early stage   Endometrial polyp    Heart murmur    as a child   Hyperlipidemia    Hyperlipidemia    on simvastatin   Hypertension    Hypothyroidism    IBS (irritable bowel syndrome)    Obesity    Osteoarthritis    Seasonal allergies    Sleep apnea    no cpap   Past Surgical History:  Procedure Laterality Date   BUNIONECTOMY  2009   Dr.Duda, left   CARPAL TUNNEL RELEASE Right 11/28/2021   Procedure: RIGHT CARPAL TUNNEL RELEASE;  Surgeon: Sherilyn Cooter, MD;  Location: [facility];  Service: Orthopedics;  Laterality: Right;   EYE SURGERY     HYSTEROSCOPY WITH D & C  04/24/2011   Procedure: DILATATION AND CURETTAGE (D&C) /HYSTEROSCOPY;  Surgeon: Eldred Manges, MD;  Location: [facility];  Service: Gynecology;  Laterality: N/A;   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   Dr Chrys Racer FASCIA SURGERY  1994   Dr Percell Miller   POLYPECTOMY  04/24/2011   Procedure: POLYPECTOMY;  Surgeon: Eldred Manges, MD;  Location: [facility];  Service: Gynecology;  Laterality: N/A;   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   Procedure: TOTAL KNEE ARTHROPLASTY;  Surgeon: Gaynelle Arabian, MD;  Location: [facility];  Service: Orthopedics;  Laterality: Right;   Patient Active Problem  List   Diagnosis Date Noted   Osteoarthritis of right knee 04/01/2022   History of obstructive sleep apnea 02/11/2022   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 serious comorbidity with body mass index (BMI) of 37.0 to 37.9 in adult 02/11/2022   Weight loss observed on examination 02/11/2022   Pre-operative clearance 01/17/2022   Primary osteoarthritis of right knee 01/17/2022   Carpal tunnel syndrome, right upper limb 11/15/2021   Numbness 11/02/2021   OSA (obstructive sleep apnea) 07/24/2020   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/13/2020   Upper airway cough syndrome 06/13/2020   Class 3 severe obesity due to excess calories with serious comorbidity and body mass index (BMI) of 45.0 to 49.9 in adult [facility]) 06/13/2020   MCI (mild cognitive impairment) 06/13/2020   Insulin resistance 05/09/2020   Insomnia 05/09/2020   Vitamin D deficiency 02/17/2019   Diarrhea 02/17/2019   Essential hypertension 02/17/2019   Class 3 severe obesity due to excess calories with serious comorbidity and body mass index (BMI) of 40.0 to 44.9 in adult ([place]) 02/17/2019   Hair loss 02/17/2019   Anxiety 11/30/2017   Primary osteoarthritis of both knees 11/30/2017   Abnormal WBC count 11/30/2017   Obesity (BMI  30-39.9) 07/23/2013   Suspicious mole 02/01/2013   Palpitations 07/23/2012   Hyperlipidemia 01/28/2012   Morbid obesity ([place]) 04/24/2011   Preventative health care 02/20/2011   Skin lump of leg 10/10/2010   SORE THROAT 08/15/2010   LYMPHADENOPATHY 08/15/2010   TOBACCO USE 01/17/2010   Hyperlipidemia associated with type 2 diabetes mellitus ([place]) 04/05/2008   TINNITUS, CHRONIC, 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   ALLERGIC RHINITIS 01/25/2008   LIVER FUNCTION TESTS, ABNORMAL 01/25/2008   Hypothyroidism 01/13/2007   Depression with anxiety 01/13/2007   TRANSIENT GLOBAL AMNESIA 01/13/2007   FOOT SURGERY, HX OF 01/13/2007    PCP: Garnet Koyanagi REFERRING PROVIDER: Jodi Mourning  REFERRING DIAG: R42- Vertigo  THERAPY DIAG:   BPPV (benign paroxysmal positional vertigo), left  Dizziness and giddiness  Unsteadiness on feet  Difficulty in walking, not elsewhere classified  ONSET DATE: 09/10/22  Rationale for Evaluation and Treatment: Rehabilitation  SUBJECTIVE:   SUBJECTIVE STATEMENT: I am doing very good. Today I have not been dizzy and I have been moving a lot so it surprised me that I can move fast.   PERTINENT HISTORY: TKA R 2023, is scheduled for L side in April  PAIN:  Are you having pain? No  PRECAUTIONS: None  WEIGHT BEARING RESTRICTIONS: No  FALLS: Has patient fallen in last 6 months? No  LIVING ENVIRONMENT: Lives with: lives with their spouse Lives in: House/apartment Stairs: Yes: Internal: 15 steps; on right going up and External: 6 steps; on right going up Has following equipment at home: Single point cane  PLOF: Independent  PATIENT GOALS: to get rid of dizziness and get my knee surgery  OBJECTIVE:   COGNITION: Overall cognitive status: Within functional limits for tasks assessed   SENSATION: WFL   POSTURE:  rounded shoulders, forward head, and flexed trunk   Cervical ROM:    Active A/PROM (deg) eval  Flexion WFL  Extension WFL dizzy coming back to middle  Right lateral flexion WFL  Left lateral flexion Dizzy with L lateral flexion  Right rotation WNL  Left rotation WNL  (Blank rows = not tested)  GAIT: Gait pattern: step to pattern, decreased step length- Left, decreased hip/knee flexion- Left, antalgic, and wide BOS Distance walked: in clinic distances Assistive device utilized: Single point cane Level of assistance: Modified independence Comments: antalgic gait on LLE is supposed to get TKA done in a few weeks  VESTIBULAR ASSESSMENT:   SYMPTOM BEHAVIOR:  Type of dizziness: Spinning/Vertigo and Lightheadedness/Faint  Frequency: every night  Duration: no more than 20-40s  Aggravating factors: Induced by position change: sitting to laying  supine  Relieving factors: head stationary, medication, and rest  Progression of symptoms: better  OCULOMOTOR EXAM:  Ocular Alignment: normal  Ocular ROM: No Limitations  Spontaneous Nystagmus: absent  Gaze-Induced Nystagmus:  slight when looking to the left  Smooth Pursuits: intact  Saccades: intact  VESTIBULAR - OCULAR REFLEX:   Slow VOR: Normal  VOR Cancellation: Normal   POSITIONAL TESTING: Right Dix-Hallpike: no nystagmus Left Dix-Hallpike: pure upbeating  MOTION SENSITIVITY:  Motion Sensitivity Quotient Intensity: 0 = none, 1 = Lightheaded, 2 = Mild, 3 = Moderate, 4 = Severe, 5 = Vomiting  Intensity  1. Sitting to supine   2. Supine to L side   3. Supine to R side   4. Supine to sitting   5. L Hallpike-Dix   6. Up from L    7. R Hallpike-Dix   8. Up from R    9. Sitting, head tipped to L  knee   10. Head up from L knee   11. Sitting, head tipped to R knee   12. Head up from R knee   13. Sitting head turns x5   14.Sitting head nods x5   15. In stance, 180 turn to L    16. In stance, 180 turn to R      VESTIBULAR TREATMENT:                                                                                                   DATE:  09/30/22 NuStep L5 x100mins  Standing on airex feet together, then EC Standing on airex feet together with head turns, then with eyes closed Cone taps on airex 2x10  Brandt-Daroff x3  HS curls 20# 2x10, LLE x10  Leg ext 10# 2x10, 5# LLE x10   09/27/22 Laruth Bouchard daroff review VOR x1 on airex 20 reps horizontal and vertical Standing on airex eyes closed  VOR x2 on airex 20 reps Picking up cones standing on airex Marching on airex 20 reps  Walking beam  Walking with head turns forwards and backwards Walking with head turns at target  Calf stretch on slant 30s  NuStep L4 x60mins    09/25/22 Canalith Repositioning: Epley Left: Number of Reps: 2 Response to Treatment: symptoms improved Reviewed Brandt-Daroff exercises as she was  doing them incorrectly VOR x1 20 reps  VOR x2 20 reps  Standing on airex 30s, feet together, then EC Head turns on airex VOR on airex horizontal and vertical Walking with head turns and walking backwards   09/19/22 Canalith Repositioning: Epley Left: Number of Reps: 1 and Response to Treatment: symptoms improved but lingering dizziness when she sat back up Brandt-Daroff reps x3 Smooth pursuits horizontal and vertical 20 reps  VOR x1 unable to maintain gaze after 7 reps VOR x2   09/17/22  Canalith Repositioning:  Epley Left: Number of Reps: 2 and Response to Treatment: symptoms improved  PATIENT EDUCATION: Education details: POC and HEP Person educated: Patient Education method: Explanation Education comprehension: verbalized understanding  HOME EXERCISE PROGRAM: Access Code: [email or web address] URL: [email or web address] Date: 09/17/2022 Prepared by: Andris Baumann  Exercises - Self-Epley Maneuver Left Ear  - 1 x daily - 7 x weekly - 2 reps - Seated Gaze Stabilization with Head Nod  - 1 x daily - 7 x weekly - 2 sets - 10 reps - Seated Gaze Stabilization with Head Rotation  - 1 x daily - 7 x weekly - 2 sets - 10 reps - Seated VOR Cancellation  - 1 x daily - 7 x weekly - 2 sets - 10 reps - Seated Horizontal Smooth Pursuit  - 1 x daily - 7 x weekly - 2 sets - 10 reps - Seated Vertical Smooth Pursuit  - 1 x daily - 7 x weekly - 2 sets - 10 reps  GOALS: Goals reviewed with patient? Yes  SHORT TERM GOALS: Target date: 10/08/22  Patient will be independent with initial HEP. Goal status: INITIAL  LONG TERM GOALS: Target date: 10/22/22  Patient will be independent with advanced/ongoing HEP  to improve outcomes and carryover.  Goal status: INITIAL  2.  Patient will report decrease in dizziness by 75%  Goal status: INITIAL  3.  Patient will be able to lay in bed without getting dizzy. Goal status: INITIAL    ASSESSMENT:  CLINICAL IMPRESSION: Patient returns with no  dizziness, states it has been good for about 4 days now. We worked on balance and then redid Brandt-Daroff exercises for 3 reps. She does really well this time around and has some slight upbeating nystagmus on the first rep but after that she is able to do it without anything dizziness. Patient asked if she could work on some knee strengthening at end of visit.   OBJECTIVE IMPAIRMENTS: dizziness.   REHAB POTENTIAL: Good  CLINICAL DECISION MAKING: Stable/uncomplicated  EVALUATION COMPLEXITY: Low   PLAN:  PT FREQUENCY: 2x/week  PT DURATION: 6 weeks  PLANNED INTERVENTIONS: Therapeutic exercises, Therapeutic activity, Neuromuscular re-education, Balance training, Gait training, Patient/Family education, Self Care, Joint mobilization, Vestibular training, Canalith repositioning, Cryotherapy, Moist heat, and Manual therapy  PLAN FOR NEXT SESSION: reassess how BD exercises are going, continue with VOR exercises, standing on airex, walking with head turns   [employer], PT 09/30/2022, 4:09 PM

## 2022-09-30 NOTE — Patient Instructions (Signed)
DUE TO COVID-19 ONLY TWO VISITORS  (aged 75 and older)  ARE ALLOWED TO COME WITH YOU AND STAY IN THE WAITING ROOM ONLY DURING PRE OP AND PROCEDURE.   **NO VISITORS ARE ALLOWED IN THE SHORT STAY AREA OR RECOVERY ROOM!!**  IF YOU WILL BE ADMITTED INTO THE HOSPITAL YOU ARE ALLOWED ONLY FOUR SUPPORT PEOPLE DURING VISITATION HOURS ONLY (7 AM -8PM)   The support person(s) must pass our screening, gel in and out, and wear a mask at all times, including in the patient's room. Patients must also wear a mask when staff or their support person are in the room. Visitors GUEST BADGE MUST BE WORN VISIBLY  One adult visitor may remain with you overnight and MUST be in the room by 8 P.M.     Your procedure is scheduled on: 10/07/22   Report to [facility] Main Entrance    Report to admitting at : 5:50 AM   Call this number if you have problems the morning of surgery [phone]   Do not eat food :After Midnight.   After Midnight you may have the following liquids until : 5:00 AM DAY OF SURGERY  Water Black Coffee (sugar ok, NO MILK/CREAM OR CREAMERS)  Tea (sugar ok, NO MILK/CREAM OR CREAMERS) regular and decaf                             Plain Jell-O (NO RED)                                           Fruit ices (not with fruit pulp, NO RED)                                     Popsicles (NO RED)                                                                  Juice: apple, WHITE grape, WHITE cranberry Sports drinks like Gatorade (NO RED)                The day of surgery:  Drink ONE (1) Pre-Surgery Clear  G2 at : 5:00 AM the morning of surgery. Drink in one sitting. Do not sip.  This drink was given to you during your hospital  pre-op appointment visit. Nothing else to drink after completing the  Pre-Surgery Clear Ensure or G2.          If you have questions, please contact your surgeon's office     Oral Hygiene is also important to reduce your risk of infection.                                     Remember - BRUSH YOUR TEETH THE MORNING OF SURGERY WITH YOUR REGULAR TOOTHPASTE  DENTURES WILL BE REMOVED PRIOR TO SURGERY PLEASE DO NOT APPLY "Poly grip" OR ADHESIVES!!!   Do NOT smoke after Midnight   Take these medicines the  morning of surgery with A SIP OF WATER: sertraline,levothyroxine. How to Manage Your Diabetes Before and After Surgery  Why is it important to control my blood sugar before and after surgery? Improving blood sugar levels before and after surgery helps healing and can limit problems. A way of improving blood sugar control is eating a healthy diet by:  Eating less sugar and carbohydrates  Increasing activity/exercise  Talking with your doctor about reaching your blood sugar goals High blood sugars (greater than 180 mg/dL) can raise your risk of infections and slow your recovery, so you will need to focus on controlling your diabetes during the weeks before surgery. Make sure that the doctor who takes care of your diabetes knows about your planned surgery including the date and location.  How do I manage my blood sugar before surgery? Check your blood sugar at least 4 times a day, starting 2 days before surgery, to make sure that the level is not too high or low. Check your blood sugar the morning of your surgery when you wake up and every 2 hours until you get to the Short Stay unit. If your blood sugar is less than 70 mg/dL, you will need to treat for low blood sugar: Do not take insulin. Treat a low blood sugar (less than 70 mg/dL) with  cup of clear juice (cranberry or apple), 4 glucose tablets, OR glucose gel. Recheck blood sugar in 15 minutes after treatment (to make sure it is greater than 70 mg/dL). If your blood sugar is not greater than 70 mg/dL on recheck, call [phone] for further instructions. Report your blood sugar to the short stay nurse when you get to Short Stay.  If you are admitted to the hospital after surgery: Your blood  sugar will be checked by the staff and you will probably be given insulin after surgery (instead of oral diabetes medicines) to make sure you have good blood sugar levels. The goal for blood sugar control after surgery is 80-180 mg/dL.   WHAT DO I DO ABOUT MY DIABETES MEDICATION?  Do not take oral diabetes medicines (pills) the morning of surgery.  THE DAY BEFORE SURGERY, take metformin as usual.      THE MORNING OF SURGERY, DO NOT TAKE ANY ORAL DIABETIC MEDICATIONS DAY OF YOUR SURGERY  Bring CPAP mask and tubing day of surgery.                              You may not have any metal on your body including hair pins, jewelry, and body piercing             Do not wear make-up, lotions, powders, perfumes/cologne, or deodorant  Do not wear nail polish including gel and S&S, artificial/acrylic nails, or any other type of covering on natural nails including finger and toenails. If you have artificial nails, gel coating, etc. that needs to be removed by a nail salon please have this removed prior to surgery or surgery may need to be canceled/ delayed if the surgeon/ anesthesia feels like they are unable to be safely monitored.   Do not shave  48 hours prior to surgery.   Do not bring valuables to the hospital. [place].   Contacts, glasses, or bridgework may not be worn into surgery.   Bring small overnight bag day of  surgery.   DO NOT [place]. PHARMACY WILL DISPENSE MEDICATIONS LISTED ON YOUR MEDICATION LIST TO YOU DURING YOUR ADMISSION [place]!    Patients discharged on the day of surgery will not be allowed to drive home.  Someone NEEDS to stay with you for the first 24 hours after anesthesia.   Special Instructions: Bring a copy of your healthcare power of attorney and living will documents         the day of surgery if you haven't scanned them before.              Please read over the  following fact sheets you were given: IF YOU HAVE QUESTIONS ABOUT YOUR PRE-OP INSTRUCTIONS PLEASE CALL [phone]    [facility] - Preparing for Surgery Before surgery, you can play an important role.  Because skin is not sterile, your skin needs to be as free of germs as possible.  You can reduce the number of germs on your skin by washing with CHG (chlorahexidine gluconate) soap before surgery.  CHG is an antiseptic cleaner which kills germs and bonds with the skin to continue killing germs even after washing. Please DO NOT use if you have an allergy to CHG or antibacterial soaps.  If your skin becomes reddened/irritated stop using the CHG and inform your nurse when you arrive at Short Stay. Do not shave (including legs and underarms) for at least 48 hours prior to the first CHG shower.  You may shave your face/neck. Please follow these instructions carefully:  1.  Shower with CHG Soap the night before surgery and the  morning of Surgery.  2.  If you choose to wash your hair, wash your hair first as usual with your  normal  shampoo.  3.  After you shampoo, rinse your hair and body thoroughly to remove the  shampoo.                           4.  Use CHG as you would any other liquid soap.  You can apply chg directly  to the skin and wash                       Gently with a scrungie or clean washcloth.  5.  Apply the CHG Soap to your body ONLY FROM THE NECK DOWN.   Do not use on face/ open                           Wound or open sores. Avoid contact with eyes, ears mouth and genitals (private parts).                       Wash face,  Genitals (private parts) with your normal soap.             6.  Wash thoroughly, paying special attention to the area where your surgery  will be performed.  7.  Thoroughly rinse your body with warm water from the neck down.  8.  DO NOT shower/wash with your normal soap after using and rinsing off  the CHG Soap.                9.  Pat yourself dry with a clean  towel.            10.  Wear clean pajamas.  11.  Place clean sheets on your bed the night of your first shower and do not  sleep with pets. Day of Surgery : Do not apply any lotions/deodorants the morning of surgery.  Please wear clean clothes to the hospital/surgery center.  FAILURE TO FOLLOW THESE INSTRUCTIONS MAY RESULT IN THE CANCELLATION OF YOUR SURGERY PATIENT SIGNATURE_________________________________  NURSE SIGNATURE__________________________________  ________________________________________________________________________  [name] [name]  An incentive spirometer is a tool that can help keep your lungs clear and active. This tool measures how well you are filling your lungs with each breath. Taking long deep breaths may help reverse or decrease the chance of developing breathing (pulmonary) problems (especially infection) following: A long period of time when you are unable to move or be active. BEFORE THE PROCEDURE  If the spirometer includes an indicator to show your best effort, your nurse or respiratory therapist will set it to a desired goal. If possible, sit up straight or lean slightly forward. Try not to slouch. Hold the incentive spirometer in an upright position. INSTRUCTIONS FOR USE  Sit on the edge of your bed if possible, or sit up as far as you can in bed or on a chair. Hold the incentive spirometer in an upright position. Breathe out normally. Place the mouthpiece in your mouth and seal your lips tightly around it. Breathe in slowly and as deeply as possible, raising the piston or the ball toward the top of the column. Hold your breath for 3-5 seconds or for as long as possible. Allow the piston or ball to fall to the bottom of the column. Remove the mouthpiece from your mouth and breathe out normally. Rest for a few seconds and repeat Steps 1 through 7 at least 10 times every 1-2 hours when you are awake. Take your time and take a few normal  breaths between deep breaths. The spirometer may include an indicator to show your best effort. Use the indicator as a goal to work toward during each repetition. After each set of 10 deep breaths, practice coughing to be sure your lungs are clear. If you have an incision (the cut made at the time of surgery), support your incision when coughing by placing a pillow or rolled up towels firmly against it. Once you are able to get out of bed, walk around indoors and cough well. You may stop using the incentive spirometer when instructed by your caregiver.  RISKS AND COMPLICATIONS Take your time so you do not get dizzy or light-headed. If you are in pain, you may need to take or ask for pain medication before doing incentive spirometry. It is harder to take a deep breath if you are having pain. AFTER USE Rest and breathe slowly and easily. It can be helpful to keep track of a log of your progress. Your caregiver can provide you with a simple table to help with this. If you are using the spirometer at home, follow these instructions: [place] IF:  You are having difficultly using the spirometer. You have trouble using the spirometer as often as instructed. Your pain medication is not giving enough relief while using the spirometer. You develop fever of 100.5 F (38.1 C) or higher. SEEK IMMEDIATE MEDICAL CARE IF:  You cough up bloody sputum that had not been present before. You develop fever of 102 F (38.9 C) or greater. You develop worsening pain at or near the incision site. MAKE SURE YOU:  Understand these instructions. Will watch your condition. Will get help  right away if you are not doing well or get worse. Document Released: 11/11/2006 Document Revised: 09/23/2011 Document Reviewed: 01/12/2007 [facility] Patient Information 2014 [place], [place].   ________________________________________________________________________

## 2022-10-01 ENCOUNTER — Encounter (HOSPITAL_COMMUNITY): Payer: Self-pay

## 2022-10-01 ENCOUNTER — Encounter (HOSPITAL_COMMUNITY)
Admission: RE | Admit: 2022-10-01 | Discharge: 2022-10-01 | Disposition: A | Discharge status: Home / Self Care | Payer: 59 | Source: Ambulatory Visit | Attending: Orthopedic Surgery | Admitting: Orthopedic Surgery

## 2022-10-01 ENCOUNTER — Other Ambulatory Visit: Payer: Self-pay

## 2022-10-01 VITALS — BP 107/60 | HR 65 | Temp 98.9°F | Ht 59.0 in | Wt 177.0 lb

## 2022-10-01 DIAGNOSIS — E88819 Insulin resistance, unspecified: Secondary | ICD-10-CM | POA: Diagnosis not present

## 2022-10-01 DIAGNOSIS — I10 Essential (primary) hypertension: Secondary | ICD-10-CM | POA: Diagnosis not present

## 2022-10-01 DIAGNOSIS — Z01818 Encounter for other preprocedural examination: Secondary | ICD-10-CM | POA: Insufficient documentation

## 2022-10-01 LAB — BASIC METABOLIC PANEL
Anion gap: 11 (ref 5–15)
BUN: 16 mg/dL (ref 8–23)
CO2: 27 mmol/L (ref 22–32)
Calcium: 9.1 mg/dL (ref 8.9–10.3)
Chloride: 103 mmol/L (ref 98–111)
Creatinine, Ser: 0.52 mg/dL (ref 0.44–1.00)
GFR, Estimated: >60 mL/min (ref >60)
Glucose, Bld: 93 mg/dL (ref 70–99)
Potassium: 3.7 mmol/L (ref 3.5–5.1)
Sodium: 141 mmol/L (ref 135–145)

## 2022-10-01 LAB — SURGICAL PCR SCREEN
MRSA, PCR: NEGATIVE
Staphylococcus aureus: NEGATIVE

## 2022-10-01 LAB — CBC
HCT: 44.3 % (ref 36.0–46.0)
Hemoglobin: 14.4 g/dL (ref 12.0–15.0)
MCH: 29.6 pg (ref 26.0–34.0)
MCHC: 32.5 g/dL (ref 30.0–36.0)
MCV: 91 fL (ref 80.0–100.0)
Platelets: 188 10*3/uL (ref 150–400)
RBC: 4.87 MIL/uL (ref 3.87–5.11)
RDW: 14.9 % (ref 11.5–15.5)
WBC: 3.8 10*3/uL — ABNORMAL LOW (ref 4.0–10.5)
nRBC: 0 % (ref 0.0–0.2)

## 2022-10-01 LAB — GLUCOSE, CAPILLARY: Glucose-Capillary: 104 mg/dL — ABNORMAL HIGH (ref 70–99)

## 2022-10-01 NOTE — Progress Notes (Signed)
For Short Stay: [place] appointment date:  Bowel Prep reminder:   For Anesthesia: PCP - DO: Rosalita Chessman. LOV: 09/10/22 Cardiologist - N/A  Chest x-ray - CT chest: 02/26/22 EKG - 09/10/22 Stress Test -  ECHO - 05/25/2021 Cardiac Cath -  Pacemaker/ICD device last checked: Pacemaker orders received: Device Rep notified:  Spinal Cord Stimulator:  Sleep Study -  CPAP -   Fasting Blood Sugar - N/A Checks Blood Sugar __0___ times a day Date and result of last Hgb A1c-5.8: 08/30/22  Last dose of GLP1 agonist- N/A GLP1 instructions:   Last dose of SGLT-2 inhibitors- N/A SGLT-2 instructions:   Blood Thinner Instructions: Aspirin Instructions: Last Dose:  Activity level: Can go up a flight of stairs and activities of daily living without stopping and without chest pain and/or shortness of breath   Able to exercise without chest pain and/or shortness of breath  Anesthesia review: Hx: DIA,Emphysema,Heart murmur,OSA(NO CPAP)  Patient denies shortness of breath, fever, cough and chest pain at PAT appointment   Patient verbalized understanding of instructions that were given to them at the PAT appointment. Patient was also instructed that they will need to review over the PAT instructions again at home before surgery.

## 2022-10-02 ENCOUNTER — Ambulatory Visit: Payer: 59 | Admitting: Physical Therapy

## 2022-10-03 ENCOUNTER — Ambulatory Visit: Payer: 59

## 2022-10-03 DIAGNOSIS — H8112 Benign paroxysmal vertigo, left ear: Secondary | ICD-10-CM

## 2022-10-03 DIAGNOSIS — R42 Dizziness and giddiness: Secondary | ICD-10-CM

## 2022-10-03 DIAGNOSIS — R2681 Unsteadiness on feet: Secondary | ICD-10-CM

## 2022-10-03 NOTE — Therapy (Signed)
OUTPATIENT PHYSICAL THERAPY VESTIBULAR TREATMENT     Patient Name: [name] [name] MRN: [id number] DOB:1945-12-30, 77 y.o., female Today's Date: 10/03/2022  END OF SESSION:  PT End of Session - 10/03/22 1548     Visit Number 6    Date for PT Re-Evaluation 10/22/22    Authorization Type U[employer]    PT Start Time [id number]    PT Stop Time 1630    PT Time Calculation (min) 45 min    Activity Tolerance Patient tolerated treatment well    Behavior During Therapy WFL for tasks assessed/performed              Past Medical History:  Diagnosis Date   Allergic rhinitis    Anxiety    Arthritis    Cataract    Depression    Diabetes mellitus without complication ([place])    borderline   Emphysema lung ([place])    Early stage   Endometrial polyp    Heart murmur    as a child   Hyperlipidemia    Hyperlipidemia    on simvastatin   Hypertension    Hypothyroidism    IBS (irritable bowel syndrome)    Obesity    Osteoarthritis    Seasonal allergies    Sleep apnea    no cpap   Past Surgical History:  Procedure Laterality Date   BUNIONECTOMY  2009   Dr.Duda, left   CARPAL TUNNEL RELEASE Right 11/28/2021   Procedure: RIGHT CARPAL TUNNEL RELEASE;  Surgeon: Sherilyn Cooter, MD;  Location: [facility];  Service: Orthopedics;  Laterality: Right;   EYE SURGERY     HYSTEROSCOPY WITH D & C  04/24/2011   Procedure: DILATATION AND CURETTAGE (D&C) /HYSTEROSCOPY;  Surgeon: Eldred Manges, MD;  Location: [facility];  Service: Gynecology;  Laterality: N/A;   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   Dr Chrys Racer FASCIA SURGERY  1994   Dr Percell Miller   POLYPECTOMY  04/24/2011   Procedure: POLYPECTOMY;  Surgeon: Eldred Manges, MD;  Location: [facility];  Service: Gynecology;  Laterality: N/A;   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   Procedure: TOTAL KNEE ARTHROPLASTY;  Surgeon: Gaynelle Arabian, MD;  Location: [facility];  Service: Orthopedics;  Laterality: Right;   Patient Active Problem  List   Diagnosis Date Noted   Osteoarthritis of right knee 04/01/2022   History of obstructive sleep apnea 02/11/2022   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 serious comorbidity with body mass index (BMI) of 37.0 to 37.9 in adult 02/11/2022   Weight loss observed on examination 02/11/2022   Pre-operative clearance 01/17/2022   Primary osteoarthritis of right knee 01/17/2022   Carpal tunnel syndrome, right upper limb 11/15/2021   Numbness 11/02/2021   OSA (obstructive sleep apnea) 07/24/2020   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/13/2020   Upper airway cough syndrome 06/13/2020   Class 3 severe obesity due to excess calories with serious comorbidity and body mass index (BMI) of 45.0 to 49.9 in adult [facility]) 06/13/2020   MCI (mild cognitive impairment) 06/13/2020   Insulin resistance 05/09/2020   Insomnia 05/09/2020   Vitamin D deficiency 02/17/2019   Diarrhea 02/17/2019   Essential hypertension 02/17/2019   Class 3 severe obesity due to excess calories with serious comorbidity and body mass index (BMI) of 40.0 to 44.9 in adult ([place]) 02/17/2019   Hair loss 02/17/2019   Anxiety 11/30/2017   Primary osteoarthritis of both knees 11/30/2017   Abnormal WBC count 11/30/2017   Obesity (BMI  30-39.9) 07/23/2013   Suspicious mole 02/01/2013   Palpitations 07/23/2012   Hyperlipidemia 01/28/2012   Morbid obesity ([place]) 04/24/2011   Preventative health care 02/20/2011   Skin lump of leg 10/10/2010   SORE THROAT 08/15/2010   LYMPHADENOPATHY 08/15/2010   TOBACCO USE 01/17/2010   Hyperlipidemia associated with type 2 diabetes mellitus ([place]) 04/05/2008   TINNITUS, CHRONIC, 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   ALLERGIC RHINITIS 01/25/2008   LIVER FUNCTION TESTS, ABNORMAL 01/25/2008   Hypothyroidism 01/13/2007   Depression with anxiety 01/13/2007   TRANSIENT GLOBAL AMNESIA 01/13/2007   FOOT SURGERY, HX OF 01/13/2007    PCP: Garnet Koyanagi REFERRING PROVIDER: Jodi Mourning  REFERRING DIAG: R42- Vertigo  THERAPY DIAG:   BPPV (benign paroxysmal positional vertigo), left  Dizziness and giddiness  Unsteadiness on feet  ONSET DATE: 09/10/22  Rationale for Evaluation and Treatment: Rehabilitation  SUBJECTIVE:   SUBJECTIVE STATEMENT: Everything is going well. Was able to do everything in the shower bending over and coming up, wash my hair, no problems.   PERTINENT HISTORY: TKA R 2023, is scheduled for L side in April  PAIN:  Are you having pain? No  PRECAUTIONS: None  WEIGHT BEARING RESTRICTIONS: No  FALLS: Has patient fallen in last 6 months? No  LIVING ENVIRONMENT: Lives with: lives with their spouse Lives in: House/apartment Stairs: Yes: Internal: 15 steps; on right going up and External: 6 steps; on right going up Has following equipment at home: Single point cane  PLOF: Independent  PATIENT GOALS: to get rid of dizziness and get my knee surgery  OBJECTIVE:   COGNITION: Overall cognitive status: Within functional limits for tasks assessed   SENSATION: WFL   POSTURE:  rounded shoulders, forward head, and flexed trunk   Cervical ROM:    Active A/PROM (deg) eval  Flexion WFL  Extension WFL dizzy coming back to middle  Right lateral flexion WFL  Left lateral flexion Dizzy with L lateral flexion  Right rotation WNL  Left rotation WNL  (Blank rows = not tested)  GAIT: Gait pattern: step to pattern, decreased step length- Left, decreased hip/knee flexion- Left, antalgic, and wide BOS Distance walked: in clinic distances Assistive device utilized: Single point cane Level of assistance: Modified independence Comments: antalgic gait on LLE is supposed to get TKA done in a few weeks  VESTIBULAR ASSESSMENT:   SYMPTOM BEHAVIOR:  Type of dizziness: Spinning/Vertigo and Lightheadedness/Faint  Frequency: every night  Duration: no more than 20-40s  Aggravating factors: Induced by position change: sitting to laying supine  Relieving factors: head stationary, medication, and  rest  Progression of symptoms: better  OCULOMOTOR EXAM:  Ocular Alignment: normal  Ocular ROM: No Limitations  Spontaneous Nystagmus: absent  Gaze-Induced Nystagmus:  slight when looking to the left  Smooth Pursuits: intact  Saccades: intact  VESTIBULAR - OCULAR REFLEX:   Slow VOR: Normal  VOR Cancellation: Normal   POSITIONAL TESTING: Right Dix-Hallpike: no nystagmus Left Dix-Hallpike: pure upbeating  MOTION SENSITIVITY:  Motion Sensitivity Quotient Intensity: 0 = none, 1 = Lightheaded, 2 = Mild, 3 = Moderate, 4 = Severe, 5 = Vomiting  Intensity  1. Sitting to supine   2. Supine to L side   3. Supine to R side   4. Supine to sitting   5. L Hallpike-Dix   6. Up from L    7. R Hallpike-Dix   8. Up from R    9. Sitting, head tipped to L knee   10. Head up from L knee   11.  Sitting, head tipped to R knee   12. Head up from R knee   13. Sitting head turns x5   14.Sitting head nods x5   15. In stance, 180 turn to L    16. In stance, 180 turn to R      VESTIBULAR TREATMENT:                                                                                                   DATE:  10/03/22 NuStep L5 x96mins Walking with head turns  Walking playing catch different directions Walking with eyes closed Walking backwards  Standing on airex head turns Marching on airex minA  Walking on beam     09/30/22 NuStep L5 x33mins  Standing on airex feet together, then EC Standing on airex feet together with head turns, then with eyes closed Cone taps on airex 2x10  Brandt-Daroff x3  HS curls 20# 2x10, LLE x10  Leg ext 10# 2x10, 5# LLE x10   09/27/22 Laruth Bouchard daroff review VOR x1 on airex 20 reps horizontal and vertical Standing on airex eyes closed  VOR x2 on airex 20 reps Picking up cones standing on airex Marching on airex 20 reps  Walking beam  Walking with head turns forwards and backwards Walking with head turns at target  Calf stretch on slant 30s  NuStep L4 x20mins     09/25/22 Canalith Repositioning: Epley Left: Number of Reps: 2 Response to Treatment: symptoms improved Reviewed Brandt-Daroff exercises as she was doing them incorrectly VOR x1 20 reps  VOR x2 20 reps  Standing on airex 30s, feet together, then EC Head turns on airex VOR on airex horizontal and vertical Walking with head turns and walking backwards   09/19/22 Canalith Repositioning: Epley Left: Number of Reps: 1 and Response to Treatment: symptoms improved but lingering dizziness when she sat back up Brandt-Daroff reps x3 Smooth pursuits horizontal and vertical 20 reps  VOR x1 unable to maintain gaze after 7 reps VOR x2   09/17/22  Canalith Repositioning:  Epley Left: Number of Reps: 2 and Response to Treatment: symptoms improved  PATIENT EDUCATION: Education details: POC and HEP Person educated: Patient Education method: Explanation Education comprehension: verbalized understanding  HOME EXERCISE PROGRAM: Access Code: [email or web address] URL: [email or web address] Date: 09/17/2022 Prepared by: Andris Baumann  Exercises - Self-Epley Maneuver Left Ear  - 1 x daily - 7 x weekly - 2 reps - Seated Gaze Stabilization with Head Nod  - 1 x daily - 7 x weekly - 2 sets - 10 reps - Seated Gaze Stabilization with Head Rotation  - 1 x daily - 7 x weekly - 2 sets - 10 reps - Seated VOR Cancellation  - 1 x daily - 7 x weekly - 2 sets - 10 reps - Seated Horizontal Smooth Pursuit  - 1 x daily - 7 x weekly - 2 sets - 10 reps - Seated Vertical Smooth Pursuit  - 1 x daily - 7 x weekly - 2 sets - 10 reps  GOALS: Goals reviewed with patient? Yes  SHORT TERM GOALS: Target date: 10/08/22  Patient will be independent with initial HEP. Goal status: MET  LONG TERM GOALS: Target date: 10/22/22  Patient will be independent with advanced/ongoing HEP to improve outcomes and carryover.  Goal status: MET  2.  Patient will report decrease in dizziness by 75%  Goal status: MET  3.  Patient  will be able to lay in bed without getting dizzy. Goal status: MET    ASSESSMENT:  CLINICAL IMPRESSION: Patient returns with no dizziness, states it has been great. She is able to do her household activities and shower without any dizziness. D/C due to meeting goals but also because patient is getting knee surgery on Monday.    OBJECTIVE IMPAIRMENTS: dizziness.   REHAB POTENTIAL: Good  CLINICAL DECISION MAKING: Stable/uncomplicated  EVALUATION COMPLEXITY: Low   PLAN:  PT FREQUENCY: 2x/week  PT DURATION: 6 weeks  PLANNED INTERVENTIONS: Therapeutic exercises, Therapeutic activity, Neuromuscular re-education, Balance training, Gait training, Patient/Family education, Self Care, Joint mobilization, Vestibular training, Canalith repositioning, Cryotherapy, Moist heat, and Manual therapy  PLAN FOR NEXT SESSION: reassess how BD exercises are going, continue with VOR exercises, standing on airex, walking with head turns  PHYSICAL THERAPY DISCHARGE SUMMARY  Visits from Start of Care: 6   Patient agrees to discharge. Patient goals were met. Patient is being discharged due to meeting the stated rehab goals.   [place], [place] 10/03/2022, 4:26 PM

## 2022-10-06 NOTE — Anesthesia Preprocedure Evaluation (Signed)
Anesthesia Evaluation  Patient identified by MRN, date of birth, ID band Patient awake    Reviewed: Allergy & Precautions, H&P , NPO status , Patient's Chart, lab work & pertinent test results  Airway Mallampati: II  TM Distance: >3 FB Neck ROM: Full    Dental no notable dental hx. (+) Teeth Intact, Dental Advisory Given   Pulmonary sleep apnea , COPD, former smoker   Pulmonary exam normal breath sounds clear to auscultation       Cardiovascular Exercise Tolerance: Good hypertension,  Rhythm:Regular Rate:Normal     Neuro/Psych   Anxiety Depression    negative neurological ROS     GI/Hepatic negative GI ROS, Neg liver ROS,,,  Endo/Other  diabetes, Type 2, Oral Hypoglycemic AgentsHypothyroidism  Morbid obesity  Renal/GU negative Renal ROS  negative genitourinary   Musculoskeletal  (+) Arthritis , Osteoarthritis,    Abdominal   Peds  Hematology negative hematology ROS (+)   Anesthesia Other Findings   Reproductive/Obstetrics negative OB ROS                             Anesthesia Physical Anesthesia Plan  ASA: 3  Anesthesia Plan: Spinal   Post-op Pain Management: Regional block* and Tylenol PO (pre-op)*   Induction: Intravenous  PONV Risk Score and Plan: 3 and Ondansetron, Dexamethasone and Propofol infusion  Airway Management Planned: Natural Airway and Simple Face Mask  Additional Equipment:   Intra-op Plan:   Post-operative Plan:   Informed Consent: I have reviewed the patients History and Physical, chart, labs and discussed the procedure including the risks, benefits and alternatives for the proposed anesthesia with the patient or authorized representative who has indicated his/her understanding and acceptance.     Dental advisory given  Plan Discussed with: CRNA  Anesthesia Plan Comments:        Anesthesia Quick Evaluation

## 2022-10-07 ENCOUNTER — Ambulatory Visit (HOSPITAL_COMMUNITY): Payer: 59 | Admitting: Physician Assistant

## 2022-10-07 ENCOUNTER — Other Ambulatory Visit: Payer: Self-pay

## 2022-10-07 ENCOUNTER — Ambulatory Visit (HOSPITAL_BASED_OUTPATIENT_CLINIC_OR_DEPARTMENT_OTHER): Payer: 59 | Admitting: Anesthesiology

## 2022-10-07 ENCOUNTER — Observation Stay (HOSPITAL_COMMUNITY)
Admission: RE | Admit: 2022-10-07 | Discharge: 2022-10-08 | Disposition: A | Discharge status: Home / Self Care | Payer: 59 | Source: Ambulatory Visit | Attending: Orthopedic Surgery | Admitting: Orthopedic Surgery

## 2022-10-07 ENCOUNTER — Ambulatory Visit: Payer: 59 | Admitting: Physical Therapy

## 2022-10-07 ENCOUNTER — Encounter (HOSPITAL_COMMUNITY)
Admission: RE | Disposition: A | Discharge status: Home / Self Care | Payer: Self-pay | Source: Ambulatory Visit | Attending: Orthopedic Surgery

## 2022-10-07 ENCOUNTER — Encounter (HOSPITAL_COMMUNITY): Payer: Self-pay | Admitting: Orthopedic Surgery

## 2022-10-07 DIAGNOSIS — Z7984 Long term (current) use of oral hypoglycemic drugs: Secondary | ICD-10-CM | POA: Diagnosis not present

## 2022-10-07 DIAGNOSIS — E119 Type 2 diabetes mellitus without complications: Secondary | ICD-10-CM | POA: Insufficient documentation

## 2022-10-07 DIAGNOSIS — Z96651 Presence of right artificial knee joint: Secondary | ICD-10-CM | POA: Diagnosis not present

## 2022-10-07 DIAGNOSIS — Z87891 Personal history of nicotine dependence: Secondary | ICD-10-CM

## 2022-10-07 DIAGNOSIS — E039 Hypothyroidism, unspecified: Secondary | ICD-10-CM | POA: Diagnosis not present

## 2022-10-07 DIAGNOSIS — J449 Chronic obstructive pulmonary disease, unspecified: Secondary | ICD-10-CM | POA: Diagnosis not present

## 2022-10-07 DIAGNOSIS — I10 Essential (primary) hypertension: Secondary | ICD-10-CM | POA: Insufficient documentation

## 2022-10-07 DIAGNOSIS — M1712 Unilateral primary osteoarthritis, left knee: Secondary | ICD-10-CM

## 2022-10-07 HISTORY — PX: TOTAL KNEE ARTHROPLASTY: SHX125

## 2022-10-07 LAB — GLUCOSE, CAPILLARY
Glucose-Capillary: 92 mg/dL (ref 70–99)
Glucose-Capillary: 105 mg/dL — ABNORMAL HIGH (ref 70–99)
Glucose-Capillary: 107 mg/dL — ABNORMAL HIGH (ref 70–99)
Glucose-Capillary: 141 mg/dL — ABNORMAL HIGH (ref 70–99)
Glucose-Capillary: 147 mg/dL — ABNORMAL HIGH (ref 70–99)

## 2022-10-07 SURGERY — ARTHROPLASTY, KNEE, TOTAL
Anesthesia: Spinal | Site: Knee | Laterality: Left

## 2022-10-07 MED ORDER — DEXAMETHASONE SODIUM PHOSPHATE 10 MG/ML IJ SOLN
8.0000 mg | Freq: Once | INTRAMUSCULAR | Status: AC
  Administered 2022-10-07: 8 mg via INTRAVENOUS

## 2022-10-07 MED ORDER — DEXAMETHASONE SODIUM PHOSPHATE 10 MG/ML IJ SOLN
INTRAMUSCULAR | Status: AC
  Filled 2022-10-07: qty 1

## 2022-10-07 MED ORDER — BUPIVACAINE LIPOSOME 1.3 % IJ SUSP: 20.0000 mL | Freq: Once | INTRAMUSCULAR | Status: DC

## 2022-10-07 MED ORDER — BUPIVACAINE IN DEXTROSE 0.75-8.25 % IT SOLN
INTRATHECAL | Status: DC | PRN
  Administered 2022-10-07: 1.6 mL via INTRATHECAL

## 2022-10-07 MED ORDER — ACETAMINOPHEN 10 MG/ML IV SOLN
1000.0000 mg | Freq: Once | INTRAVENOUS | Status: AC
  Administered 2022-10-07: 1000 mg via INTRAVENOUS
  Filled 2022-10-07: qty 100

## 2022-10-07 MED ORDER — PHENOL 1.4 % MT LIQD: 1.0000 | OROMUCOSAL | Status: DC | PRN

## 2022-10-07 MED ORDER — ASPIRIN 81 MG PO CHEW
81.0000 mg | CHEWABLE_TABLET | Freq: Two times a day (BID) | ORAL | Status: DC
  Administered 2022-10-08: 81 mg via ORAL
  Filled 2022-10-07: qty 1

## 2022-10-07 MED ORDER — SODIUM CHLORIDE 0.9 % IR SOLN
Status: DC | PRN
  Administered 2022-10-07: 1000 mL

## 2022-10-07 MED ORDER — ONDANSETRON HCL 4 MG PO TABS: 4.0000 mg | ORAL_TABLET | Freq: Four times a day (QID) | ORAL | Status: DC | PRN

## 2022-10-07 MED ORDER — ORAL CARE MOUTH RINSE: 15.0000 mL | Freq: Once | OROMUCOSAL | Status: AC

## 2022-10-07 MED ORDER — METOCLOPRAMIDE HCL 5 MG/ML IJ SOLN: 5.0000 mg | Freq: Three times a day (TID) | INTRAMUSCULAR | Status: DC | PRN

## 2022-10-07 MED ORDER — PROPOFOL 500 MG/50ML IV EMUL
INTRAVENOUS | Status: DC | PRN
  Administered 2022-10-07: 100 ug/kg/min via INTRAVENOUS

## 2022-10-07 MED ORDER — LEVOTHYROXINE SODIUM 125 MCG PO TABS
125.0000 ug | ORAL_TABLET | Freq: Every day | ORAL | Status: DC
  Administered 2022-10-08: 125 ug via ORAL
  Filled 2022-10-07: qty 1

## 2022-10-07 MED ORDER — BUPIVACAINE LIPOSOME 1.3 % IJ SUSP
INTRAMUSCULAR | Status: DC | PRN
  Administered 2022-10-07: 20 mL

## 2022-10-07 MED ORDER — MIDAZOLAM HCL 2 MG/2ML IJ SOLN
INTRAMUSCULAR | Status: AC
  Filled 2022-10-07: qty 2

## 2022-10-07 MED ORDER — CHLORHEXIDINE GLUCONATE 0.12 % MT SOLN
15.0000 mL | Freq: Once | OROMUCOSAL | Status: AC
  Administered 2022-10-07: 15 mL via OROMUCOSAL

## 2022-10-07 MED ORDER — POLYETHYLENE GLYCOL 3350 17 G PO PACK: 17.0000 g | PACK | Freq: Every day | ORAL | Status: DC | PRN

## 2022-10-07 MED ORDER — POLYVINYL ALCOHOL 1.4 % OP SOLN: 1.0000 [drp] | OPHTHALMIC | Status: DC | PRN

## 2022-10-07 MED ORDER — SIMVASTATIN 20 MG PO TABS: 20.0000 mg | ORAL_TABLET | Freq: Every day | ORAL | Status: DC

## 2022-10-07 MED ORDER — OXYCODONE HCL 5 MG PO TABS
5.0000 mg | ORAL_TABLET | ORAL | Status: DC | PRN
  Administered 2022-10-07 (×2): 10 mg via ORAL
  Administered 2022-10-07: 5 mg via ORAL
  Administered 2022-10-08 (×2): 10 mg via ORAL
  Filled 2022-10-07 (×3): qty 2
  Filled 2022-10-07: qty 1
  Filled 2022-10-07 (×3): qty 2

## 2022-10-07 MED ORDER — BISACODYL 10 MG RE SUPP: 10.0000 mg | Freq: Every day | RECTAL | Status: DC | PRN

## 2022-10-07 MED ORDER — MECLIZINE HCL 25 MG PO TABS: 25.0000 mg | ORAL_TABLET | Freq: Three times a day (TID) | ORAL | Status: DC | PRN

## 2022-10-07 MED ORDER — SODIUM CHLORIDE (PF) 0.9 % IJ SOLN
INTRAMUSCULAR | Status: DC | PRN
  Administered 2022-10-07: 60 mL

## 2022-10-07 MED ORDER — SODIUM CHLORIDE (PF) 0.9 % IJ SOLN
INTRAMUSCULAR | Status: AC
  Filled 2022-10-07: qty 50

## 2022-10-07 MED ORDER — PHENYLEPHRINE HCL-NACL 20-0.9 MG/250ML-% IV SOLN
INTRAVENOUS | Status: DC | PRN
  Administered 2022-10-07: 20 ug/min via INTRAVENOUS

## 2022-10-07 MED ORDER — DIPHENHYDRAMINE HCL 12.5 MG/5ML PO ELIX: 12.5000 mg | ORAL_SOLUTION | ORAL | Status: DC | PRN

## 2022-10-07 MED ORDER — PHENYLEPHRINE HCL-NACL 20-0.9 MG/250ML-% IV SOLN
INTRAVENOUS | Status: AC
  Filled 2022-10-07: qty 250

## 2022-10-07 MED ORDER — METHOCARBAMOL 1000 MG/10ML IJ SOLN: 500.0000 mg | Freq: Four times a day (QID) | INTRAVENOUS | Status: DC | PRN

## 2022-10-07 MED ORDER — HYDROMORPHONE HCL 1 MG/ML IJ SOLN: 0.2500 mg | INTRAMUSCULAR | Status: DC | PRN

## 2022-10-07 MED ORDER — ONDANSETRON HCL 4 MG/2ML IJ SOLN
INTRAMUSCULAR | Status: AC
  Filled 2022-10-07: qty 2

## 2022-10-07 MED ORDER — SERTRALINE HCL 100 MG PO TABS
100.0000 mg | ORAL_TABLET | Freq: Every day | ORAL | Status: DC
  Administered 2022-10-08: 100 mg via ORAL
  Filled 2022-10-07: qty 1

## 2022-10-07 MED ORDER — ONDANSETRON HCL 4 MG/2ML IJ SOLN: 4.0000 mg | Freq: Four times a day (QID) | INTRAMUSCULAR | Status: DC | PRN

## 2022-10-07 MED ORDER — OXYCODONE HCL 5 MG PO TABS
10.0000 mg | ORAL_TABLET | ORAL | Status: DC | PRN
  Administered 2022-10-08: 10 mg via ORAL

## 2022-10-07 MED ORDER — ONDANSETRON HCL 4 MG/2ML IJ SOLN
INTRAMUSCULAR | Status: DC | PRN
  Administered 2022-10-07: 4 mg via INTRAVENOUS

## 2022-10-07 MED ORDER — FLEET ENEMA 7-19 GM/118ML RE ENEM: 1.0000 | ENEMA | Freq: Once | RECTAL | Status: DC | PRN

## 2022-10-07 MED ORDER — MENTHOL 3 MG MT LOZG: 1.0000 | LOZENGE | OROMUCOSAL | Status: DC | PRN

## 2022-10-07 MED ORDER — HYDROMORPHONE HCL 1 MG/ML IJ SOLN
0.5000 mg | INTRAMUSCULAR | Status: DC | PRN
  Administered 2022-10-07: 0.5 mg via INTRAVENOUS
  Filled 2022-10-07: qty 1

## 2022-10-07 MED ORDER — CYCLOSPORINE 0.05 % OP EMUL
1.0000 [drp] | Freq: Two times a day (BID) | OPHTHALMIC | Status: DC
  Administered 2022-10-07 – 2022-10-08 (×2): 1 [drp] via OPHTHALMIC
  Filled 2022-10-07 (×2): qty 30

## 2022-10-07 MED ORDER — LACTATED RINGERS IV SOLN: INTRAVENOUS | Status: DC | PRN

## 2022-10-07 MED ORDER — SODIUM CHLORIDE (PF) 0.9 % IJ SOLN
INTRAMUSCULAR | Status: AC
  Filled 2022-10-07: qty 10

## 2022-10-07 MED ORDER — LACTATED RINGERS IV SOLN: INTRAVENOUS | Status: DC

## 2022-10-07 MED ORDER — CEFAZOLIN SODIUM-DEXTROSE 2-4 GM/100ML-% IV SOLN
2.0000 g | Freq: Four times a day (QID) | INTRAVENOUS | Status: AC
  Administered 2022-10-07 (×2): 2 g via INTRAVENOUS
  Filled 2022-10-07 (×2): qty 100

## 2022-10-07 MED ORDER — FENTANYL CITRATE (PF) 100 MCG/2ML IJ SOLN
INTRAMUSCULAR | Status: AC
  Filled 2022-10-07: qty 2

## 2022-10-07 MED ORDER — METOCLOPRAMIDE HCL 5 MG PO TABS: 5.0000 mg | ORAL_TABLET | Freq: Three times a day (TID) | ORAL | Status: DC | PRN

## 2022-10-07 MED ORDER — SODIUM CHLORIDE 0.9 % IV SOLN: INTRAVENOUS | Status: DC

## 2022-10-07 MED ORDER — CEFAZOLIN SODIUM-DEXTROSE 2-4 GM/100ML-% IV SOLN
2.0000 g | INTRAVENOUS | Status: AC
  Administered 2022-10-07: 2 g via INTRAVENOUS
  Filled 2022-10-07: qty 100

## 2022-10-07 MED ORDER — 0.9 % SODIUM CHLORIDE (POUR BTL) OPTIME
TOPICAL | Status: DC | PRN
  Administered 2022-10-07: 1000 mL

## 2022-10-07 MED ORDER — BUPIVACAINE LIPOSOME 1.3 % IJ SUSP
INTRAMUSCULAR | Status: AC
  Filled 2022-10-07: qty 20

## 2022-10-07 MED ORDER — INSULIN ASPART 100 UNIT/ML IJ SOLN
0.0000 [IU] | Freq: Three times a day (TID) | INTRAMUSCULAR | Status: DC
  Administered 2022-10-07: 2 [IU] via SUBCUTANEOUS
  Administered 2022-10-08: 3 [IU] via SUBCUTANEOUS

## 2022-10-07 MED ORDER — ACETAMINOPHEN 500 MG PO TABS: 1000.0000 mg | ORAL_TABLET | Freq: Once | ORAL | Status: DC

## 2022-10-07 MED ORDER — TRANEXAMIC ACID-NACL 1000-0.7 MG/100ML-% IV SOLN
1000.0000 mg | INTRAVENOUS | Status: AC
  Administered 2022-10-07: 1000 mg via INTRAVENOUS
  Filled 2022-10-07: qty 100

## 2022-10-07 MED ORDER — DOCUSATE SODIUM 100 MG PO CAPS
100.0000 mg | ORAL_CAPSULE | Freq: Two times a day (BID) | ORAL | Status: DC
  Administered 2022-10-07: 100 mg via ORAL
  Filled 2022-10-07 (×3): qty 1

## 2022-10-07 MED ORDER — FENTANYL CITRATE PF 50 MCG/ML IJ SOSY
50.0000 ug | PREFILLED_SYRINGE | INTRAMUSCULAR | Status: DC
  Administered 2022-10-07: 50 ug via INTRAVENOUS
  Filled 2022-10-07: qty 2

## 2022-10-07 MED ORDER — BUPIVACAINE-EPINEPHRINE (PF) 0.5% -1:200000 IJ SOLN
INTRAMUSCULAR | Status: DC | PRN
  Administered 2022-10-07: 20 mL via PERINEURAL

## 2022-10-07 MED ORDER — MIDAZOLAM HCL 2 MG/2ML IJ SOLN
1.0000 mg | INTRAMUSCULAR | Status: DC
  Filled 2022-10-07: qty 2

## 2022-10-07 MED ORDER — STERILE WATER FOR IRRIGATION IR SOLN
Status: DC | PRN
  Administered 2022-10-07: 2000 mL

## 2022-10-07 MED ORDER — INSULIN ASPART 100 UNIT/ML IJ SOLN: 0.0000 [IU] | Freq: Every day | INTRAMUSCULAR | Status: DC

## 2022-10-07 MED ORDER — ACETAMINOPHEN 500 MG PO TABS
1000.0000 mg | ORAL_TABLET | Freq: Four times a day (QID) | ORAL | Status: AC
  Administered 2022-10-07 – 2022-10-08 (×3): 1000 mg via ORAL
  Filled 2022-10-07 (×4): qty 2

## 2022-10-07 MED ORDER — METHOCARBAMOL 500 MG PO TABS
500.0000 mg | ORAL_TABLET | Freq: Four times a day (QID) | ORAL | Status: DC | PRN
  Administered 2022-10-07 – 2022-10-08 (×3): 500 mg via ORAL
  Filled 2022-10-07 (×4): qty 1

## 2022-10-07 MED ORDER — POVIDONE-IODINE 10 % EX SWAB
2.0000 | Freq: Once | CUTANEOUS | Status: AC
  Administered 2022-10-07: 2 via TOPICAL

## 2022-10-07 SURGICAL SUPPLY — 58 items
ATTUNE MED DOME PAT 38 KNEE (Knees) IMPLANT
ATTUNE PS FEM LT SZ 5 CEM KNEE (Femur) IMPLANT
ATTUNE PSRP INSR SZ5 8 KNEE (Insert) IMPLANT
BAG COUNTER SPONGE SURGICOUNT (BAG) IMPLANT
BAG SPEC THK2 15X12 ZIP CLS (MISCELLANEOUS) ×1
BAG SPNG CNTER NS LX DISP (BAG)
BAG ZIPLOCK 12X15 (MISCELLANEOUS) ×1 IMPLANT
BASEPLATE TIBIAL ROTATING SZ 4 (Knees) IMPLANT
BLADE SAG 18X100X1.27 (BLADE) ×1 IMPLANT
BLADE SAW SGTL 11.0X1.19X90.0M (BLADE) ×1 IMPLANT
BNDG CMPR 5X62 HK CLSR LF (GAUZE/BANDAGES/DRESSINGS) ×1
BNDG CMPR MED 10X6 ELC LF (GAUZE/BANDAGES/DRESSINGS) ×1
BNDG ELASTIC 6INX 5YD STR LF (GAUZE/BANDAGES/DRESSINGS) ×1 IMPLANT
BNDG ELASTIC 6X10 VLCR STRL LF (GAUZE/BANDAGES/DRESSINGS) IMPLANT
BOWL SMART MIX CTS (DISPOSABLE) ×1 IMPLANT
BSPLAT TIB 4 CMNT ROT PLAT STR (Knees) ×1 IMPLANT
CEMENT HV SMART SET (Cement) ×2 IMPLANT
COVER SURGICAL LIGHT HANDLE (MISCELLANEOUS) ×1 IMPLANT
CUFF TOURN SGL QUICK 34 (TOURNIQUET CUFF) ×1
CUFF TRNQT CYL 34X4.125X (TOURNIQUET CUFF) ×1 IMPLANT
DRAPE INCISE IOBAN 66X45 STRL (DRAPES) ×1 IMPLANT
DRAPE U-SHAPE 47X51 STRL (DRAPES) ×1 IMPLANT
DRSG AQUACEL AG ADV 3.5X10 (GAUZE/BANDAGES/DRESSINGS) ×1 IMPLANT
DURAPREP 26ML APPLICATOR (WOUND CARE) ×1 IMPLANT
ELECT REM PT RETURN 15FT ADLT (MISCELLANEOUS) ×1 IMPLANT
GLOVE BIO SURGEON STRL SZ 6.5 (GLOVE) IMPLANT
GLOVE BIO SURGEON STRL SZ7.5 (GLOVE) IMPLANT
GLOVE BIO SURGEON STRL SZ8 (GLOVE) ×1 IMPLANT
GLOVE BIOGEL PI IND STRL 6.5 (GLOVE) IMPLANT
GLOVE BIOGEL PI IND STRL 7.0 (GLOVE) IMPLANT
GLOVE BIOGEL PI IND STRL 8 (GLOVE) ×1 IMPLANT
GOWN STRL REUS W/ TWL LRG LVL3 (GOWN DISPOSABLE) ×1 IMPLANT
GOWN STRL REUS W/ TWL XL LVL3 (GOWN DISPOSABLE) IMPLANT
GOWN STRL REUS W/TWL LRG LVL3 (GOWN DISPOSABLE) ×1
GOWN STRL REUS W/TWL XL LVL3 (GOWN DISPOSABLE)
HANDPIECE INTERPULSE COAX TIP (DISPOSABLE) ×1
HOLDER FOLEY CATH W/STRAP (MISCELLANEOUS) IMPLANT
IMMOBILIZER KNEE 20 (SOFTGOODS) ×1
IMMOBILIZER KNEE 20 THIGH 36 (SOFTGOODS) ×1 IMPLANT
KIT TURNOVER KIT A (KITS) IMPLANT
MANIFOLD NEPTUNE II (INSTRUMENTS) ×1 IMPLANT
NS IRRIG 1000ML POUR BTL (IV SOLUTION) ×1 IMPLANT
PACK TOTAL KNEE CUSTOM (KITS) ×1 IMPLANT
PADDING CAST COTTON 6X4 STRL (CAST SUPPLIES) ×2 IMPLANT
PIN STEINMAN FIXATION KNEE (PIN) IMPLANT
PROTECTOR NERVE ULNAR (MISCELLANEOUS) ×1 IMPLANT
SET HNDPC FAN SPRY TIP SCT (DISPOSABLE) ×1 IMPLANT
SPIKE FLUID TRANSFER (MISCELLANEOUS) ×1 IMPLANT
STRIP CLOSURE SKIN 1/2X4 (GAUZE/BANDAGES/DRESSINGS) ×2 IMPLANT
SUT MNCRL AB 4-0 PS2 18 (SUTURE) ×1 IMPLANT
SUT STRATAFIX 0 PDS 27 VIOLET (SUTURE) ×1
SUT VIC AB 2-0 CT1 27 (SUTURE) ×3
SUT VIC AB 2-0 CT1 TAPERPNT 27 (SUTURE) ×3 IMPLANT
SUTURE STRATFX 0 PDS 27 VIOLET (SUTURE) ×1 IMPLANT
TRAY FOLEY MTR SLVR 16FR STAT (SET/KITS/TRAYS/PACK) ×1 IMPLANT
TUBE SUCTION HIGH CAP CLEAR NV (SUCTIONS) ×1 IMPLANT
WATER STERILE IRR 1000ML POUR (IV SOLUTION) ×2 IMPLANT
WRAP KNEE MAXI GEL POST OP (GAUZE/BANDAGES/DRESSINGS) ×1 IMPLANT

## 2022-10-07 NOTE — Anesthesia Procedure Notes (Signed)
Anesthesia Regional Block: Adductor canal block   Pre-Anesthetic Checklist: , timeout performed,  Correct Patient, Correct Site, Correct Laterality,  Correct Procedure, Correct Position, site marked,  Risks and benefits discussed,  Pre-op evaluation,  At surgeon's request and post-op pain management  Laterality: Left  Prep: Maximum Sterile Barrier Precautions used, chloraprep       Needles:  Injection technique: Single-shot  Needle Type: Echogenic Stimulator Needle     Needle Length: 9cm  Needle Gauge: 21     Additional Needles:   Procedures:,,,, ultrasound used (permanent image in chart),,    Narrative:  Start time: 10/07/2022 7:36 AM End time: 10/07/2022 7:46 AM Injection made incrementally with aspirations every 5 mL.  Performed by: Personally  Anesthesiologist: Roderic Palau, MD  Additional Notes:

## 2022-10-07 NOTE — Evaluation (Signed)
Physical Therapy Evaluation Patient Details Name: [name] [name] MRN: [id number] DOB: [birth year] Today's Date: 10/07/2022  History of Present Illness  77 yo female s/p L TKA 10/07/22. PMH: RTKA 2023, DM, HLD, OSA, R CTR, mild cognitive impairment, HTN  Clinical Impression  Pt is s/p TKA resulting in the deficits listed below (see PT Problem List).  PT is motivated and cooperative, amb ~ 103' with RW and min assist, anticipate steady progress in acute setting.  Pt will benefit from acute skilled PT to increase their independence and safety with mobility to allow discharge.         Recommendations for follow up therapy are one component of a multi-disciplinary discharge planning process, led by the attending physician.  Recommendations may be updated based on patient status, additional functional criteria and insurance authorization.  Follow Up Recommendations       Assistance Recommended at Discharge Intermittent Supervision/Assistance  Patient can return home with the following  A little help with bathing/dressing/bathroom;Assist for transportation;Assistance with cooking/housework;Help with stairs or ramp for entrance    Equipment Recommendations None recommended by PT  Recommendations for Other Services       Functional Status Assessment Patient has had a recent decline in their functional status and demonstrates the ability to make significant improvements in function in a reasonable and predictable amount of time.     Precautions / Restrictions Precautions Precautions: Fall;Knee Required Braces or Orthoses: Knee Immobilizer - Left Restrictions Weight Bearing Restrictions: No LLE Weight Bearing: Weight bearing as tolerated      Mobility  Bed Mobility Overal bed mobility: Needs Assistance Bed Mobility: Supine to Sit     Supine to sit: Min guard     General bed mobility comments: for safety    Transfers Overall transfer level: Needs assistance Equipment used:  Rolling walker (2 wheels) Transfers: Sit to/from Stand Sit to Stand: Min guard           General transfer comment: cues for hand placement    Ambulation/Gait Ambulation/Gait assistance: Min assist Gait Distance (Feet): 50 Feet Assistive device: Rolling walker (2 wheels) Gait Pattern/deviations: Step-to pattern       General Gait Details: cues for sequence and RW position from self  Stairs            Wheelchair Mobility    Modified Rankin (Stroke Patients Only)       Balance                                             Pertinent Vitals/Pain Pain Assessment Pain Assessment: 0-10 Pain Score: 2  Pain Location: left knee Pain Descriptors / Indicators: Sore Pain Intervention(s): Limited activity within patient's tolerance, Monitored during session, Premedicated before session, Repositioned    Home Living Family/patient expects to be discharged to:: Private residence Living Arrangements: Spouse/significant other Available Help at Discharge: Family;Available 24 hours/day Type of Home: House Home Access: Stairs to enter Entrance Stairs-Rails: None Entrance Stairs-Number of Steps: 3 Alternate Level Stairs-Number of Steps: 14 Home Layout: Two level;Bed/bath upstairs Home Equipment: Shower seat - built in;Grab bars - tub/shower;Rolling Walker (2 wheels);Rollator (4 wheels);Cane - single point      Prior Function Prior Level of Function : Independent/Modified Independent                     Hand Dominance  Extremity/Trunk Assessment   Upper Extremity Assessment Upper Extremity Assessment: Overall WFL for tasks assessed    Lower Extremity Assessment Lower Extremity Assessment: LLE deficits/detail LLE Deficits / Details: ankle WFL, knee extension and hip flexion 2+/5       Communication   Communication: No difficulties  Cognition Arousal/Alertness: Awake/alert Behavior During Therapy: WFL for tasks  assessed/performed Overall Cognitive Status: Within Functional Limits for tasks assessed                                          General Comments      Exercises Total Joint Exercises Ankle Circles/Pumps: AROM, Both, 10 reps Quad Sets: AROM, Both, 5 reps Heel Slides: AROM, AAROM, Left, 5 reps   Assessment/Plan    PT Assessment Patient needs continued PT services  PT Problem List Decreased strength;Decreased range of motion;Decreased mobility;Decreased activity tolerance;Decreased knowledge of use of DME       PT Treatment Interventions DME instruction;Therapeutic exercise;Gait training;Functional mobility training;Therapeutic activities;Patient/family education;Stair training    PT Goals (Current goals can be found in the Care Plan section)  Acute Rehab PT Goals PT Goal Formulation: With patient Time For Goal Achievement: 10/14/22 Potential to Achieve Goals: Good    Frequency 7X/week     Co-evaluation               AM-PAC PT "6 Clicks" Mobility  Outcome Measure Help needed turning from your back to your side while in a flat bed without using bedrails?: A Little Help needed moving from lying on your back to sitting on the side of a flat bed without using bedrails?: A Little Help needed moving to and from a bed to a chair (including a wheelchair)?: A Little Help needed standing up from a chair using your arms (e.g., wheelchair or bedside chair)?: A Little Help needed to walk in hospital room?: A Little Help needed climbing 3-5 steps with a railing? : A Little 6 Click Score: 18    End of Session Equipment Utilized During Treatment: Gait belt Activity Tolerance: Patient tolerated treatment well Patient left: in chair;with call bell/phone within reach;with chair alarm set   PT Visit Diagnosis: Other abnormalities of gait and mobility (R26.89);Difficulty in walking, not elsewhere classified (R26.2)    Time: [id number] PT Time Calculation (min)  (ACUTE ONLY): 17 min   Charges:   PT Evaluation $PT Eval Low Complexity: [place], PT  Acute Rehab Dept [facility]) [phone]  10/07/2022   [facility] 10/07/2022, 3:39 PM

## 2022-10-07 NOTE — Progress Notes (Signed)
Orthopedic Tech Progress Note Patient Details:  [name] [name] 03/25/1946 [id number]  CPM Left Knee CPM Left Knee: Off Left Knee Flexion (Degrees): 40 Left Knee Extension (Degrees): 10  Post Interventions Patient Tolerated: Well Instructions Provided: Care of device  [name] 10/07/2022, 2:35 PM

## 2022-10-07 NOTE — Discharge Instructions (Signed)
[name] Arabian, MD Total Joint Specialist [facility] [address] [place], [place] [address] [phone]  TOTAL KNEE REPLACEMENT POSTOPERATIVE DIRECTIONS    Knee Rehabilitation, Guidelines Following Surgery  Results after knee surgery are often greatly improved when you follow the exercise, range of motion and muscle strengthening exercises prescribed by your doctor. Safety measures are also important to protect the knee from further injury. If any of these exercises cause you to have increased pain or swelling in your knee joint, decrease the amount until you are comfortable again and slowly increase them. If you have problems or questions, call your caregiver or physical therapist for advice.   BLOOD CLOT PREVENTION Take an 81 mg Aspirin two times a day for three weeks following surgery. Then take an 81 mg Aspirin once a day for three weeks. Then discontinue Aspirin. You may resume your vitamins/supplements upon discharge from the hospital. Do not take any NSAIDs (Advil, Aleve, Ibuprofen, Meloxicam, etc.) until you are 3 weeks out from surgery  [place] items at home which could result in a fall. This includes throw rugs or furniture in walking pathways.  ICE to the affected knee as much as tolerated. Icing helps control swelling. If the swelling is well controlled you will be more comfortable and rehab easier. Continue to use ice on the knee for pain and swelling from surgery. You may notice swelling that will progress down to the foot and ankle. This is normal after surgery. Elevate the leg when you are not up walking on it.    Continue to use the breathing machine which will help keep your temperature down. It is common for your temperature to cycle up and down following surgery, especially at night when you are not up moving around and exerting yourself. The breathing machine keeps your lungs expanded and your temperature down. Do  not place pillow under the operative knee, focus on keeping the knee straight while resting  DIET You may resume your previous home diet once you are discharged from the hospital.  DRESSING / WOUND CARE / SHOWERING Keep your bulky bandage on for 2 days. On the third post-operative day you may remove the Ace bandage and gauze. There is a waterproof adhesive bandage on your skin which will stay in place until your first follow-up appointment. Once you remove this you will not need to place another bandage You may begin showering 3 days following surgery, but do not submerge the incision under water.  ACTIVITY For the first 5 days, the key is rest and control of pain and swelling Do your home exercises twice a day starting on post-operative day 3. On the days you go to physical therapy, just do the home exercises once that day. You should rest, ice and elevate the leg for 50 minutes out of every hour. Get up and walk/stretch for 10 minutes per hour. After 5 days you can increase your activity slowly as tolerated. Walk with your walker as instructed. Use the walker until you are comfortable transitioning to a cane. Walk with the cane in the opposite hand of the operative leg. You may discontinue the cane once you are comfortable and walking steadily. Avoid periods of inactivity such as sitting longer than an hour when not asleep. This helps prevent blood clots.  You may discontinue the knee immobilizer once you are able to perform a straight leg raise while lying down. You may resume a sexual relationship in one month  or when given the OK by your doctor.  You may return to work once you are cleared by your doctor.  Do not drive a car for 6 weeks or until released by your surgeon.  Do not drive while taking narcotics.  TED HOSE STOCKINGS Wear the elastic stockings on both legs for three weeks following surgery during the day. You may remove them at night for sleeping.  WEIGHT BEARING Weight  bearing as tolerated with assist device (walker, cane, etc) as directed, use it as long as suggested by your surgeon or therapist, typically at least 4-6 weeks.  POSTOPERATIVE CONSTIPATION PROTOCOL Constipation - defined medically as fewer than three stools per week and severe constipation as less than one stool per week.  One of the most common issues patients have following surgery is constipation.  Even if you have a regular bowel pattern at home, your normal regimen is likely to be disrupted due to multiple reasons following surgery.  Combination of anesthesia, postoperative narcotics, change in appetite and fluid intake all can affect your bowels.  In order to avoid complications following surgery, here are some recommendations in order to help you during your recovery period.  Colace (docusate) - Pick up an over-the-counter form of Colace or another stool softener and take twice a day as long as you are requiring postoperative pain medications.  Take with a full glass of water daily.  If you experience loose stools or diarrhea, hold the colace until you stool forms back up. If your symptoms do not get better within 1 week or if they get worse, check with your doctor. Dulcolax (bisacodyl) - Pick up over-the-counter and take as directed by the product packaging as needed to assist with the movement of your bowels.  Take with a full glass of water.  Use this product as needed if not relieved by Colace only.  MiraLax (polyethylene glycol) - Pick up over-the-counter to have on hand. MiraLax is a solution that will increase the amount of water in your bowels to assist with bowel movements.  Take as directed and can mix with a glass of water, juice, soda, coffee, or tea. Take if you go more than two days without a movement. Do not use MiraLax more than once per day. Call your doctor if you are still constipated or irregular after using this medication for 7 days in a row.  If you continue to have problems  with postoperative constipation, please contact the office for further assistance and recommendations.  If you experience "the worst abdominal pain ever" or develop nausea or vomiting, please contact the office immediatly for further recommendations for treatment.  ITCHING If you experience itching with your medications, try taking only a single pain pill, or even half a pain pill at a time.  You can also use Benadryl over the counter for itching or also to help with sleep.   MEDICATIONS See your medication summary on the "After Visit Summary" that the nursing staff will review with you prior to discharge.  You may have some home medications which will be placed on hold until you complete the course of blood thinner medication.  It is important for you to complete the blood thinner medication as prescribed by your surgeon.  Continue your approved medications as instructed at time of discharge.  PRECAUTIONS If you experience chest pain or shortness of breath - call 911 immediately for transfer to the hospital emergency department.  If you develop a fever greater that 101 F,  purulent drainage from wound, increased redness or drainage from wound, foul odor from the wound/dressing, or calf pain - CONTACT YOUR SURGEON.                                                   FOLLOW-UP APPOINTMENTS Make sure you keep all of your appointments after your operation with your surgeon and caregivers. You should call the office at the above phone number and make an appointment for approximately two weeks after the date of your surgery or on the date instructed by your surgeon outlined in the "After Visit Summary".  RANGE OF MOTION AND STRENGTHENING EXERCISES  Rehabilitation of the knee is important following a knee injury or an operation. After just a few days of immobilization, the muscles of the thigh which control the knee become weakened and shrink (atrophy). Knee exercises are designed to build up the tone and  strength of the thigh muscles and to improve knee motion. Often times heat used for twenty to thirty minutes before working out will loosen up your tissues and help with improving the range of motion but do not use heat for the first two weeks following surgery. These exercises can be done on a training (exercise) mat, on the floor, on a table or on a bed. Use what ever works the best and is most comfortable for you Knee exercises include:  Leg Lifts - While your knee is still immobilized in a splint or cast, you can do straight leg raises. Lift the leg to 60 degrees, hold for 3 sec, and slowly lower the leg. Repeat 10-20 times 2-3 times daily. Perform this exercise against resistance later as your knee gets better.  Quad and Hamstring Sets - Tighten up the muscle on the front of the thigh (Quad) and hold for 5-10 sec. Repeat this 10-20 times hourly. Hamstring sets are done by pushing the foot backward against an object and holding for 5-10 sec. Repeat as with quad sets.  Leg Slides: Lying on your back, slowly slide your foot toward your buttocks, bending your knee up off the floor (only go as far as is comfortable). Then slowly slide your foot back down until your leg is flat on the floor again. Angel Wings: Lying on your back spread your legs to the side as far apart as you can without causing discomfort.  A rehabilitation program following serious knee injuries can speed recovery and prevent re-injury in the future due to weakened muscles. Contact your doctor or a physical therapist for more information on knee rehabilitation.   POST-OPERATIVE OPIOID TAPER INSTRUCTIONS: It is important to wean off of your opioid medication as soon as possible. If you do not need pain medication after your surgery it is ok to stop day one. Opioids include: Codeine, Hydrocodone(Norco, Vicodin), Oxycodone(Percocet, oxycontin) and hydromorphone amongst others.  Long term and even short term use of opiods can  cause: Increased pain response Dependence Constipation Depression Respiratory depression And more.  Withdrawal symptoms can include Flu like symptoms Nausea, vomiting And more Techniques to manage these symptoms Hydrate well Eat regular healthy meals Stay active Use relaxation techniques(deep breathing, meditating, yoga) Do Not substitute Alcohol to help with tapering If you have been on opioids for less than two weeks and do not have pain than it is ok to stop all together.  Plan  to wean off of opioids This plan should start within one week post op of your joint replacement. Maintain the same interval or time between taking each dose and first decrease the dose.  Cut the total daily intake of opioids by one tablet each day Next start to increase the time between doses. The last dose that should be eliminated is the evening dose.   IF YOU ARE TRANSFERRED TO A SKILLED REHAB FACILITY If the patient is transferred to a skilled rehab facility following release from the hospital, a list of the current medications will be sent to the facility for the patient to continue.  When discharged from the skilled rehab facility, please have the facility set up the patient's Home Health Physical Therapy prior to being released. Also, the skilled facility will be responsible for providing the patient with their medications at time of release from the facility to include their pain medication, the muscle relaxants, and their blood thinner medication. If the patient is still at the rehab facility at time of the two week follow up appointment, the skilled rehab facility will also need to assist the patient in arranging follow up appointment in our office and any transportation needs.  MAKE SURE YOU:  Understand these instructions.  Get help right away if you are not doing well or get worse.   DENTAL ANTIBIOTICS:  In most cases prophylactic antibiotics for Dental procdeures after total joint surgery are  not necessary.  Exceptions are as follows:  1. History of prior total joint infection  2. Severely immunocompromised (Organ Transplant, cancer chemotherapy, Rheumatoid biologic meds such as Humera)  3. Poorly controlled diabetes (A1C &gt; 8.0, blood glucose over 200)  If you have one of these conditions, contact your surgeon for an antibiotic prescription, prior to your dental procedure.    Pick up stool softner and laxative for home use following surgery while on pain medications. Do not submerge incision under water. Please use good hand washing techniques while changing dressing each day. May shower starting three days after surgery. Please use a clean towel to pat the incision dry following showers. Continue to use ice for pain and swelling after surgery. Do not use any lotions or creams on the incision until instructed by your surgeon.  

## 2022-10-07 NOTE — Anesthesia Postprocedure Evaluation (Signed)
Anesthesia Post Note  Patient: TATELYN TULLIO  Procedure(s) Performed: TOTAL KNEE ARTHROPLASTY (Left: Knee)     Patient location during evaluation: PACU Anesthesia Type: Spinal and Regional Level of consciousness: oriented and awake and alert Pain management: pain level controlled Vital Signs Assessment: post-procedure vital signs reviewed and stable Respiratory status: spontaneous breathing and respiratory function stable Cardiovascular status: blood pressure returned to baseline and stable Postop Assessment: no headache, no backache, no apparent nausea or vomiting, spinal receding and patient able to bend at knees Anesthetic complications: no  No notable events documented.  Last Vitals:  Vitals:   10/07/22 1045 10/07/22 1120  BP: 126/60 (!) 129/59  Pulse: (!) 57 60  Resp: 16 18  Temp:  36.4 C  SpO2: 94% 94%    Last Pain:  Vitals:   10/07/22 1155  TempSrc:   PainSc: 4                  Graysen Depaula,W. EDMOND

## 2022-10-07 NOTE — Progress Notes (Signed)
Orthopedic Tech Progress Note Patient Details:  [name] [name] 29-Aug-1945 [id number]  CPM Left Knee CPM Left Knee: On Left Knee Flexion (Degrees): 40 Left Knee Extension (Degrees): 10  Post Interventions Patient Tolerated: Well Instructions Provided: Care of device  [name] 10/07/2022, 10:13 AM

## 2022-10-07 NOTE — Transfer of Care (Signed)
Immediate Anesthesia Transfer of Care Note  Patient: [name] [name]  Procedure(s) Performed: TOTAL KNEE ARTHROPLASTY (Left: Knee)  Patient Location: PACU  Anesthesia Type:Spinal and MAC combined with regional for post-op pain  Level of Consciousness: alert   Airway & Oxygen Therapy: Patient Spontanous Breathing and Patient connected to nasal cannula oxygen  Post-op Assessment: Report given to RN and Post -op Vital signs reviewed and stable  Post vital signs: Reviewed and stable  Last Vitals:  Vitals Value Taken Time  BP 108/53 10/07/22 1000  Temp    Pulse 66 10/07/22 1004  Resp 17 10/07/22 1004  SpO2 98 % 10/07/22 1004  Vitals shown include unvalidated device data.  Last Pain:  Vitals:   10/07/22 0759  TempSrc:   PainSc: 0-No pain         Complications: No notable events documented.

## 2022-10-07 NOTE — Interval H&P Note (Signed)
History and Physical Interval Note:  10/07/2022 6:26 AM  [name] [name]  has presented today for surgery, with the diagnosis of left knee osteoarthritis.  The various methods of treatment have been discussed with the patient and family. After consideration of risks, benefits and other options for treatment, the patient has consented to  Procedure(s): TOTAL KNEE ARTHROPLASTY (Left) as a surgical intervention.  The patient's history has been reviewed, patient examined, no change in status, stable for surgery.  I have reviewed the patient's chart and labs.  Questions were answered to the patient's satisfaction.     Pilar Plate Latishia Suitt

## 2022-10-07 NOTE — Op Note (Signed)
OPERATIVE REPORT-TOTAL KNEE ARTHROPLASTY   Pre-operative diagnosis- Osteoarthritis  Left knee(s)  Post-operative diagnosis- Osteoarthritis Left knee(s)  Procedure-  Left  Total Knee Arthroplasty  Surgeon- Dione Plover. Kierstynn Babich, MD  Assistant- Theresa Duty, PA-C   Anesthesia-   Adductor canal block and spinal  EBL-25 ml   Drains None  Tourniquet time-  Total Tourniquet Time Documented: Thigh (Left) - 37 minutes Total: Thigh (Left) - 37 minutes     Complications- None  Condition-PACU - hemodynamically stable.   Brief Clinical Note  [name] [name] is a 77 y.o. year old female with end stage OA of her left knee with progressively worsening pain and dysfunction. She has constant pain, with activity and at rest and significant functional deficits with difficulties even with ADLs. She has had extensive non-op management including analgesics, injections of cortisone and viscosupplements, and home exercise program, but remains in significant pain with significant dysfunction. Radiographs show bone on bone arthritis medial and patellofemoral. She presents now for left Total Knee Arthroplasty.     Procedure in detail---   The patient is brought into the operating room and positioned supine on the operating table. After successful administration of  Adductor canal block and spinal,   a tourniquet is placed high on the  Left thigh(s) and the lower extremity is prepped and draped in the usual sterile fashion. Time out is performed by the operating team and then the  Left lower extremity is wrapped in Esmarch, knee flexed and the tourniquet inflated to 300 mmHg.       A midline incision is made with a ten blade through the subcutaneous tissue to the level of the extensor mechanism. A fresh blade is used to make a medial parapatellar arthrotomy. Soft tissue over the proximal medial tibia is subperiosteally elevated to the joint line with a knife and into the semimembranosus bursa with a Cobb  elevator. Soft tissue over the proximal lateral tibia is elevated with attention being paid to avoiding the patellar tendon on the tibial tubercle. The patella is everted, knee flexed 90 degrees and the ACL and PCL are removed. Findings are bone on bone medial and patellofemoral with large global osteophytes        The drill is used to create a starting hole in the distal femur and the canal is thoroughly irrigated with sterile saline to remove the fatty contents. The 5 degree Left  valgus alignment guide is placed into the femoral canal and the distal femoral cutting block is pinned to remove 9 mm off the distal femur. Resection is made with an oscillating saw.      The tibia is subluxed forward and the menisci are removed. The extramedullary alignment guide is placed referencing proximally at the medial aspect of the tibial tubercle and distally along the second metatarsal axis and tibial crest. The block is pinned to remove 90mm off the more deficient medial  side. Resection is made with an oscillating saw. Size 4is the most appropriate size for the tibia and the proximal tibia is prepared with the modular drill and keel punch for that size.      The femoral sizing guide is placed and size 5 is most appropriate. Rotation is marked off the epicondylar axis and confirmed by creating a rectangular flexion gap at 90 degrees. The size 5 cutting block is pinned in this rotation and the anterior, posterior and chamfer cuts are made with the oscillating saw. The intercondylar block is then placed and that cut is  made.      Trial size 4 tibial component, trial size 5 posterior stabilized femur and a 8  mm posterior stabilized rotating platform insert trial is placed. Full extension is achieved with excellent varus/valgus and anterior/posterior balance throughout full range of motion. The patella is everted and thickness measured to be 22  mm. Free hand resection is taken to 12 mm, a 38 template is placed, lug holes are  drilled, trial patella is placed, and it tracks normally. Osteophytes are removed off the posterior femur with the trial in place. All trials are removed and the cut bone surfaces prepared with pulsatile lavage. Cement is mixed and once ready for implantation, the size 4 tibial implant, size  5 posterior stabilized femoral component, and the size 38 patella are cemented in place and the patella is held with the clamp. The trial insert is placed and the knee held in full extension. The Exparel (20 ml mixed with 60 ml saline) is injected into the extensor mechanism, posterior capsule, medial and lateral gutters and subcutaneous tissues.  All extruded cement is removed and once the cement is hard the permanent 8 mm posterior stabilized rotating platform insert is placed into the tibial tray.      The wound is copiously irrigated with saline solution and the extensor mechanism closed with # 0 Stratofix suture. The tourniquet is released for a total tourniquet time of 37  minutes. Flexion against gravity is 140 degrees and the patella tracks normally. Subcutaneous tissue is closed with 2.0 vicryl and subcuticular with running 4.0 Monocryl. The incision is cleaned and dried and steri-strips and a bulky sterile dressing are applied. The limb is placed into a knee immobilizer and the patient is awakened and transported to recovery in stable condition.      Please note that a surgical assistant was a medical necessity for this procedure in order to perform it in a safe and expeditious manner. Surgical assistant was necessary to retract the ligaments and vital neurovascular structures to prevent injury to them and also necessary for proper positioning of the limb to allow for anatomic placement of the prosthesis.   Dione Plover Tavis Kring, MD    10/07/2022, 9:37 AM

## 2022-10-07 NOTE — Anesthesia Procedure Notes (Signed)
Spinal  Patient location during procedure: OR Start time: 10/07/2022 8:22 AM End time: 10/07/2022 8:27 AM Reason for block: surgical anesthesia Staffing Performed: anesthesiologist  Anesthesiologist: Roderic Palau, MD Performed by: Roderic Palau, MD Authorized by: Roderic Palau, MD   Preanesthetic Checklist Completed: patient identified, IV checked, risks and benefits discussed, surgical consent, monitors and equipment checked, pre-op evaluation and timeout performed Spinal Block Patient position: sitting Prep: DuraPrep Patient monitoring: cardiac monitor, continuous pulse ox and blood pressure Approach: midline Location: L3-4 Injection technique: single-shot Needle Needle type: Pencan  Needle gauge: 24 G Needle length: 9 cm Assessment Sensory level: T8 Events: CSF return Additional Notes Functioning IV was confirmed and monitors were applied. Sterile prep and drape, including hand hygiene and sterile gloves were used. The patient was positioned and the spine was prepped. The skin was anesthetized with lidocaine.  Free flow of clear CSF was obtained prior to injecting local anesthetic into the CSF.  The spinal needle aspirated freely following injection.  The needle was carefully withdrawn.  The patient tolerated the procedure well.

## 2022-10-08 ENCOUNTER — Encounter (HOSPITAL_COMMUNITY): Payer: Self-pay | Admitting: Orthopedic Surgery

## 2022-10-08 DIAGNOSIS — M1712 Unilateral primary osteoarthritis, left knee: Secondary | ICD-10-CM | POA: Diagnosis not present

## 2022-10-08 LAB — CBC
HCT: 33.2 % — ABNORMAL LOW (ref 36.0–46.0)
Hemoglobin: 10.4 g/dL — ABNORMAL LOW (ref 12.0–15.0)
MCH: 29.1 pg (ref 26.0–34.0)
MCHC: 31.3 g/dL (ref 30.0–36.0)
MCV: 93 fL (ref 80.0–100.0)
Platelets: 130 10*3/uL — ABNORMAL LOW (ref 150–400)
RBC: 3.57 MIL/uL — ABNORMAL LOW (ref 3.87–5.11)
RDW: 14.6 % (ref 11.5–15.5)
WBC: 5.8 10*3/uL (ref 4.0–10.5)
nRBC: 0 % (ref 0.0–0.2)

## 2022-10-08 LAB — BASIC METABOLIC PANEL WITH GFR
Anion gap: 6 (ref 5–15)
BUN: 17 mg/dL (ref 8–23)
CO2: 26 mmol/L (ref 22–32)
Calcium: 8.4 mg/dL — ABNORMAL LOW (ref 8.9–10.3)
Chloride: 105 mmol/L (ref 98–111)
Creatinine, Ser: 0.44 mg/dL (ref 0.44–1.00)
GFR, Estimated: >60 mL/min
Glucose, Bld: 120 mg/dL — ABNORMAL HIGH (ref 70–99)
Potassium: 3.9 mmol/L (ref 3.5–5.1)
Sodium: 137 mmol/L (ref 135–145)

## 2022-10-08 LAB — GLUCOSE, CAPILLARY
Glucose-Capillary: 95 mg/dL (ref 70–99)
Glucose-Capillary: 156 mg/dL — ABNORMAL HIGH (ref 70–99)

## 2022-10-08 MED ORDER — ASPIRIN 81 MG PO CHEW: 81.0000 mg | CHEWABLE_TABLET | Freq: Two times a day (BID) | ORAL | 0 refills | Status: AC

## 2022-10-08 MED ORDER — SODIUM CHLORIDE 0.9 % IV BOLUS: 250.0000 mL | Freq: Once | INTRAVENOUS | Status: DC

## 2022-10-08 MED ORDER — METHOCARBAMOL 500 MG PO TABS: 500.0000 mg | ORAL_TABLET | Freq: Four times a day (QID) | ORAL | 0 refills | Status: DC | PRN

## 2022-10-08 MED ORDER — SODIUM CHLORIDE 0.9 % IV BOLUS
250.0000 mL | Freq: Once | INTRAVENOUS | Status: AC
  Administered 2022-10-08: 250 mL via INTRAVENOUS

## 2022-10-08 MED ORDER — OXYCODONE HCL 5 MG PO TABS: 5.0000 mg | ORAL_TABLET | Freq: Four times a day (QID) | ORAL | 0 refills | Status: DC | PRN

## 2022-10-08 NOTE — Progress Notes (Signed)
Subjective: 1 Day Post-Op Procedure(s) (LRB): TOTAL KNEE ARTHROPLASTY (Left) Patient reports pain as mild.   Patient seen in rounds by Dr. Wynelle Link. Patient is well, and has had no acute complaints or problems No issues overnight. Denies chest pain, SOB, or calf pain. Foley catheter removed this AM.  We will continue therapy today, ambulated 31' yesterday.   Objective: Vital signs in last 24 hours: Temp:  [96.8 F (36 C)-98 F (36.7 C)] 97.5 F (36.4 C) (03/26 0620) Pulse Rate:  [57-71] 59 (03/26 0620) Resp:  [12-18] 17 (03/26 0620) BP: (101-132)/(49-86) 101/49 (03/26 0620) SpO2:  [94 %-99 %] 96 % (03/26 0620)  Intake/Output from previous day:  Intake/Output Summary (Last 24 hours) at 10/08/2022 0733 Last data filed at 10/08/2022 0547 Gross per 24 hour  Intake 2645.56 ml  Output 4700 ml  Net -2054.44 ml     Intake/Output this shift: No intake/output data recorded.  Labs: Recent Labs    10/08/22 0335  HGB 10.4*   Recent Labs    10/08/22 0335  WBC 5.8  RBC 3.57*  HCT 33.2*  PLT 130*   Recent Labs    10/08/22 0335  NA 137  K 3.9  CL 105  CO2 26  BUN 17  CREATININE 0.44  GLUCOSE 120*  CALCIUM 8.4*   No results for input(s): "LABPT", "INR" in the last 72 hours.  Exam: General - Patient is Alert and Oriented Extremity - Neurologically intact Neurovascular intact Sensation intact distally Dorsiflexion/Plantar flexion intact Dressing - dressing C/D/I Motor Function - intact, moving foot and toes well on exam.   Past Medical History:  Diagnosis Date   Allergic rhinitis    Anxiety    Arthritis    Cataract    Depression    Diabetes mellitus without complication (HCC)    borderline   Emphysema lung (HCC)    Early stage   Endometrial polyp    Heart murmur    as a child   Hyperlipidemia    Hyperlipidemia    on simvastatin   Hypertension    Hypothyroidism    IBS (irritable bowel syndrome)    Obesity    Osteoarthritis    Seasonal allergies     Sleep apnea    no cpap   Vertigo 09/2022    Assessment/Plan: 1 Day Post-Op Procedure(s) (LRB): TOTAL KNEE ARTHROPLASTY (Left) Principal Problem:   Primary osteoarthritis of left knee  Estimated body mass index is 35.62 kg/m as calculated from the following:   Height as of this encounter: 4\' 11"  (1.499 m).   Weight as of this encounter: 80 kg. Advance diet Up with therapy   Patient's anticipated LOS is less than 2 midnights, meeting these requirements: - Lives within 1 hour of care - Has a competent adult at home to recover with post-op recover - NO history of  - Chronic pain requiring opiods  - Coronary Artery Disease  - Heart failure  - Heart attack  - Stroke  - DVT/VTE  - Cardiac arrhythmia  - Respiratory Failure/COPD  - Renal failure  - Anemia  - Advanced Liver disease  DVT Prophylaxis - Aspirin Weight bearing as tolerated. Continue therapy.  BP soft this AM with I/O in the negatives. 250 mL bolus given.  Plan is to go Home after hospital stay. Plan for discharge later today if progresses with therapy and meeting goals. Scheduled for OPPT at [facility]). Follow-up in the office in 2 weeks.  The PDMP database was reviewed today  prior to any opioid medications being prescribed to this patient.  Theresa Duty, PA-C Orthopedic Surgery [phone] 10/08/2022, 7:33 AM

## 2022-10-08 NOTE — Plan of Care (Signed)
  Problem: Education: Goal: Knowledge of General Education information will improve Description: Including pain rating scale, medication(s)/side effects and non-pharmacologic comfort measures Outcome: Progressing   Problem: Health Behavior/Discharge Planning: Goal: Ability to manage health-related needs will improve Outcome: Progressing   Problem: Activity: Goal: Risk for activity intolerance will decrease Outcome: Progressing   

## 2022-10-08 NOTE — TOC Transition Note (Signed)
Transition of Care [facility]) - CM/SW Discharge Note   Patient Details  Name: [name] [name] MRN: [id number] Date of Birth: Mar 12, 1946  Transition of Care [facility]) CM/SW Contact:  Lennart Pall, LCSW Phone Number: 10/08/2022, 9:40 AM   Clinical Narrative:     Met with pt who confirms she has needed DME at home.  OPPT already arranged with [facility] [employer].  No TOC needs.  Final next level of care: OP Rehab Barriers to Discharge: No Barriers Identified   Patient Goals and CMS Choice      Discharge Placement                         Discharge Plan and Services Additional resources added to the After Visit Summary for                  DME Arranged: N/A DME Agency: NA                  Social Determinants of Health (SDOH) Interventions SDOH Screenings   Food Insecurity: No Food Insecurity (10/07/2022)  Housing: Low Risk  (10/07/2022)  Transportation Needs: No Transportation Needs (10/07/2022)  Utilities: Not At Risk (10/07/2022)  Depression (PHQ2-9): Low Risk  (09/10/2022)  Tobacco Use: Medium Risk (10/07/2022)     Readmission Risk Interventions     No data to display

## 2022-10-08 NOTE — Progress Notes (Signed)
Physical Therapy Treatment Patient Details Name: [name] [name] MRN: [id number] DOB: 10-Aug-1945 Today's Date: 10/08/2022   History of Present Illness 77 yo female s/p L TKA 10/07/22. PMH: RTKA 2023, DM, HLD, OSA, R CTR, mild cognitive impairment, HTN    PT Comments    Pt progressing well. Will see again in pm, pt will likely be ready for d/c later today after pm PT session.   Recommendations for follow up therapy are one component of a multi-disciplinary discharge planning process, led by the attending physician.  Recommendations may be updated based on patient status, additional functional criteria and insurance authorization.  Follow Up Recommendations       Assistance Recommended at Discharge Intermittent Supervision/Assistance  Patient can return home with the following A little help with bathing/dressing/bathroom;Assist for transportation;Assistance with cooking/housework;Help with stairs or ramp for entrance   Equipment Recommendations  None recommended by PT    Recommendations for Other Services       Precautions / Restrictions Precautions Precautions: Fall;Knee Required Braces or Orthoses: Knee Immobilizer - Left Knee Immobilizer - Left: Discontinue once straight leg raise with < 10 degree lag Restrictions Weight Bearing Restrictions: No LLE Weight Bearing: Weight bearing as tolerated     Mobility  Bed Mobility               General bed mobility comments: in recliner    Transfers Overall transfer level: Needs assistance Equipment used: Rolling walker (2 wheels) Transfers: Sit to/from Stand Sit to Stand: Supervision           General transfer comment: cues for hand placement and  LLE    Ambulation/Gait Ambulation/Gait assistance: Min guard, Supervision Gait Distance (Feet): 80 Feet Assistive device: Rolling walker (2 wheels) Gait Pattern/deviations: Step-to pattern       General Gait Details: cues for sequence and RW position from  self   Stairs             Wheelchair Mobility    Modified Rankin (Stroke Patients Only)       Balance                                            Cognition Arousal/Alertness: Awake/alert Behavior During Therapy: WFL for tasks assessed/performed Overall Cognitive Status: Within Functional Limits for tasks assessed                                          Exercises Total Joint Exercises Ankle Circles/Pumps: AROM, Both, 10 reps Quad Sets: AROM, Both, 10 reps Heel Slides: AROM, 10 reps, AAROM, Left Straight Leg Raises: AROM, Left, 10 reps, AAROM    General Comments        Pertinent Vitals/Pain Pain Assessment Pain Assessment: 0-10 Pain Score: 4  Pain Location: left knee Pain Descriptors / Indicators: Sore Pain Intervention(s): Limited activity within patient's tolerance, Monitored during session, Premedicated before session, Repositioned, Ice applied    Home Living                          Prior Function            PT Goals (current goals can now be found in the care plan section) Acute Rehab PT Goals PT Goal Formulation: With patient Time  For Goal Achievement: 10/14/22 Potential to Achieve Goals: Good Progress towards PT goals: Progressing toward goals    Frequency    7X/week      PT Plan Current plan remains appropriate    Co-evaluation              AM-PAC PT "6 Clicks" Mobility   Outcome Measure  Help needed turning from your back to your side while in a flat bed without using bedrails?: A Little Help needed moving from lying on your back to sitting on the side of a flat bed without using bedrails?: A Little Help needed moving to and from a bed to a chair (including a wheelchair)?: A Little Help needed standing up from a chair using your arms (e.g., wheelchair or bedside chair)?: A Little Help needed to walk in hospital room?: A Little Help needed climbing 3-5 steps with a railing? : A  Little 6 Click Score: 18    End of Session Equipment Utilized During Treatment: Gait belt Activity Tolerance: Patient tolerated treatment well Patient left: in chair;with call bell/phone within reach;with chair alarm set Nurse Communication: Mobility status PT Visit Diagnosis: Other abnormalities of gait and mobility (R26.89);Difficulty in walking, not elsewhere classified (R26.2)     Time: 1003-1020 PT Time Calculation (min) (ACUTE ONLY): 17 min  Charges:  $Gait Training: 8-22 mins                     Baxter Flattery, PT  Acute Rehab Dept [facility]) [phone]  10/08/2022    [facility] 10/08/2022, 10:29 AM

## 2022-10-08 NOTE — Progress Notes (Signed)
The patient is alert and oriented and has been seen by her physician. The orders for discharge were written. IV has been removed. Went over discharge instructions with patient and family. She is being discharged via wheelchair with all of her belongings.  

## 2022-10-08 NOTE — Progress Notes (Signed)
PT TX NOTE-pm session  10/08/22 1400  PT Visit Information  Last PT Received On 10/08/22  Assistance Needed Pt progressing well, meeting PT goals; husband present for session. Pt and husband feel ready to d/c home today. PT in agreement  History of Present Illness 77 yo female s/p L TKA 10/07/22. PMH: RTKA 2023, DM, HLD, OSA, R CTR, mild cognitive impairment, HTN  Precautions  Precautions Fall;Knee  Required Braces or Orthoses Knee Immobilizer - Left  Knee Immobilizer - Left Discontinue once straight leg raise with < 10 degree lag  Restrictions  LLE Weight Bearing WBAT  Pain Assessment  Pain Assessment 0-10  Pain Score 4  Pain Location left knee  Pain Descriptors / Indicators Sore  Pain Intervention(s) Limited activity within patient's tolerance;Monitored during session;Premedicated before session;Repositioned;Ice applied  Cognition  Arousal/Alertness Awake/alert  Behavior During Therapy WFL for tasks assessed/performed  Overall Cognitive Status Within Functional Limits for tasks assessed  Bed Mobility  General bed mobility comments in recliner  Transfers  Overall transfer level Needs assistance  Equipment used Rolling walker (2 wheels)  Transfers Sit to/from Stand  Sit to Stand Supervision  General transfer comment cues for hand placement and  LLE  Ambulation/Gait  Ambulation/Gait assistance Supervision  Gait Distance (Feet) 40 Feet  Assistive device Rolling walker (2 wheels)  Gait Pattern/deviations Step-to pattern  General Gait Details cues for sequence and RW position from self. supervision for safety  Stairs Yes  Stairs assistance Min assist;Min guard  Stair Management No rails;Step to pattern;Backwards;With walker  Number of Stairs 2  General stair comments cues for sequence and safe technique; husband present, repeated for  a second trial of 2 steps with pt husband assisting  PT (acting as pt) per pt request d/t pt fatigue. no LOB, no knee buckling of pt.  Total Joint  Exercises  Ankle Circles/Pumps AROM;Both;10 reps  PT - End of Session  Equipment Utilized During Treatment Gait belt  Activity Tolerance Patient tolerated treatment well  Patient left in chair;with call bell/phone within reach;with chair alarm set  Nurse Communication Mobility status   PT - Assessment/Plan  PT Plan Current plan remains appropriate  PT Visit Diagnosis Other abnormalities of gait and mobility (R26.89);Difficulty in walking, not elsewhere classified (R26.2)  PT Frequency (ACUTE ONLY) 7X/week  Follow Up Recommendations Follow physician's recommendations for discharge plan and follow up therapies  Assistance recommended at discharge Intermittent Supervision/Assistance  Patient can return home with the following A little help with bathing/dressing/bathroom;Assist for transportation;Assistance with cooking/housework;Help with stairs or ramp for entrance  PT equipment None recommended by PT  AM-PAC PT "6 Clicks" Mobility Outcome Measure (Version 2)  Help needed turning from your back to your side while in a flat bed without using bedrails? 3  Help needed moving from lying on your back to sitting on the side of a flat bed without using bedrails? 3  Help needed moving to and from a bed to a chair (including a wheelchair)? 3  Help needed standing up from a chair using your arms (e.g., wheelchair or bedside chair)? 3  Help needed to walk in hospital room? 3  Help needed climbing 3-5 steps with a railing?  3  6 Click Score 18  Consider Recommendation of Discharge To: Home with [facility]  PT Goal Progression  Progress towards PT goals Progressing toward goals  Acute Rehab PT Goals  PT Goal Formulation With patient  Time For Goal Achievement 10/14/22  Potential to Achieve Goals Good  PT Time Calculation  PT Start Time (ACUTE ONLY) 1334  PT Stop Time (ACUTE ONLY) 1352  PT Time Calculation (min) (ACUTE ONLY) 18 min  PT General Charges  $$ ACUTE PT VISIT 1 Visit  PT Treatments  $Gait  Training 8-22 mins

## 2022-10-09 ENCOUNTER — Telehealth: Payer: Self-pay

## 2022-10-09 ENCOUNTER — Ambulatory Visit: Payer: 59 | Admitting: Physical Therapy

## 2022-10-09 NOTE — Transitions of Care (Post Inpatient/ED Visit) (Signed)
   10/09/2022  Name: [name] [name] MRN: [id number] DOB: October 30, 1945  Today's TOC FU Call Status: Today's TOC FU Call Status:: Successful TOC FU Call Competed TOC FU Call Complete Date: 10/09/22  Transition Care Management Follow-up Telephone Call Date of Discharge: 10/08/22 Discharge Facility: [facility]) Type of Discharge: Inpatient Admission Primary Inpatient Discharge Diagnosis:: osteoarthritis How have you been since you were released from the hospital?: Better Any questions or concerns?: No  Items Reviewed: Did you receive and understand the discharge instructions provided?: Yes Medications obtained and verified?: Yes (Medications Reviewed) Any new allergies since your discharge?: No Dietary orders reviewed?: Yes Do you have support at home?: Yes People in Home: spouse  Home Care and Equipment/Supplies: [place] Ordered?: NA Any new equipment or medical supplies ordered?: NA  Functional Questionnaire: Do you need assistance with bathing/showering or dressing?: No Do you need assistance with meal preparation?: No Do you need assistance with eating?: No Do you have difficulty maintaining continence: No Do you need assistance with getting out of bed/getting out of a chair/moving?: No Do you have difficulty managing or taking your medications?: No  Follow up appointments reviewed: PCP Follow-up appointment confirmed?: [facility] Follow-up appointment confirmed?: Yes Date of Specialist follow-up appointment?: 10/22/22 Follow-Up Specialty Provider:: Dr Wynelle Link Do you need transportation to your follow-up appointment?: No Do you understand care options if your condition(s) worsen?: Yes-patient verbalized understanding    [place], [place] Nurse Health Advisor Direct Dial [phone]

## 2022-10-10 ENCOUNTER — Ambulatory Visit: Payer: 59 | Attending: Physician Assistant

## 2022-10-10 DIAGNOSIS — M25662 Stiffness of left knee, not elsewhere classified: Secondary | ICD-10-CM

## 2022-10-10 DIAGNOSIS — M6281 Muscle weakness (generalized): Secondary | ICD-10-CM | POA: Diagnosis present

## 2022-10-10 DIAGNOSIS — M25562 Pain in left knee: Secondary | ICD-10-CM

## 2022-10-10 DIAGNOSIS — R262 Difficulty in walking, not elsewhere classified: Secondary | ICD-10-CM | POA: Diagnosis present

## 2022-10-10 NOTE — Therapy (Signed)
OUTPATIENT PHYSICAL THERAPY LOWER EXTREMITY EVALUATION   Patient Name: [name] [name] MRN: [id number] DOB:1946/07/04, 77 y.o., female Today's Date: 10/10/2022  END OF SESSION:  PT End of Session - 10/10/22 1231     Visit Number 1    Date for PT Re-Evaluation 01/02/23    PT Start Time [id number]    PT Stop Time 1315    PT Time Calculation (min) 45 min    Activity Tolerance Patient limited by pain             Past Medical History:  Diagnosis Date   Allergic rhinitis    Anxiety    Arthritis    Cataract    Depression    Diabetes mellitus without complication ([place])    borderline   Emphysema lung ([place])    Early stage   Endometrial polyp    Heart murmur    as a child   Hyperlipidemia    Hyperlipidemia    on simvastatin   Hypertension    Hypothyroidism    IBS (irritable bowel syndrome)    Obesity    Osteoarthritis    Seasonal allergies    Sleep apnea    no cpap   Vertigo 09/2022   Past Surgical History:  Procedure Laterality Date   BUNIONECTOMY  2009   Dr.Duda, left   CARPAL TUNNEL RELEASE Right 11/28/2021   Procedure: RIGHT CARPAL TUNNEL RELEASE;  Surgeon: Sherilyn Cooter, MD;  Location: [facility];  Service: Orthopedics;  Laterality: Right;   EYE SURGERY     HYSTEROSCOPY WITH D & C  04/24/2011   Procedure: DILATATION AND CURETTAGE (D&C) /HYSTEROSCOPY;  Surgeon: Eldred Manges, MD;  Location: [facility];  Service: Gynecology;  Laterality: N/A;   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   Dr Chrys Racer FASCIA SURGERY  1994   Dr Percell Miller   POLYPECTOMY  04/24/2011   Procedure: POLYPECTOMY;  Surgeon: Eldred Manges, MD;  Location: [facility];  Service: Gynecology;  Laterality: N/A;   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   Procedure: TOTAL KNEE ARTHROPLASTY;  Surgeon: Gaynelle Arabian, MD;  Location: [facility];  Service: Orthopedics;  Laterality: Right;   TOTAL KNEE ARTHROPLASTY Left 10/07/2022   Procedure: TOTAL KNEE ARTHROPLASTY;  Surgeon: Gaynelle Arabian, MD;  Location: [facility];  Service: Orthopedics;  Laterality: Left;   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 with body mass index (BMI) of 37.0 to 37.9 in adult 02/11/2022   Weight loss observed on examination 02/11/2022   Pre-operative clearance 01/17/2022   Primary osteoarthritis of right knee 01/17/2022   Carpal tunnel syndrome, right upper limb 11/15/2021   Numbness 11/02/2021   OSA (obstructive sleep apnea) 07/24/2020   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/13/2020   Upper airway cough syndrome 06/13/2020   Class 3 severe obesity due to excess calories with serious comorbidity and body mass index (BMI) of 45.0 to 49.9 in adult [facility]) 06/13/2020   MCI (mild cognitive impairment) 06/13/2020   Insulin resistance 05/09/2020   Insomnia 05/09/2020   Vitamin D deficiency 02/17/2019   Diarrhea 02/17/2019   Essential hypertension 02/17/2019   Class 3 severe obesity due to excess calories with serious comorbidity and body mass index (BMI) of 40.0 to 44.9 in adult ([place]) 02/17/2019   Hair loss 02/17/2019  Anxiety 11/30/2017   Primary osteoarthritis of both knees 11/30/2017   Abnormal WBC count 11/30/2017   Obesity (BMI 30-39.9) 07/23/2013   Suspicious mole 02/01/2013   Palpitations 07/23/2012   Hyperlipidemia 01/28/2012   Morbid obesity ([place]) 04/24/2011   Preventative health care 02/20/2011   Skin lump of leg 10/10/2010   SORE THROAT 08/15/2010   LYMPHADENOPATHY 08/15/2010   TOBACCO USE 01/17/2010   Hyperlipidemia associated with type 2 diabetes mellitus ([place]) 04/05/2008   TINNITUS, CHRONIC, 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   ALLERGIC RHINITIS 01/25/2008   LIVER FUNCTION TESTS, ABNORMAL 01/25/2008   Hypothyroidism 01/13/2007   Depression with anxiety 01/13/2007   TRANSIENT GLOBAL AMNESIA  01/13/2007   FOOT SURGERY, HX OF 01/13/2007    PCP: Jodi Mourning  REFERRING PROVIDER: Shearon Balo  REFERRING DIAG: s/p L TKA  THERAPY DIAG:  Acute pain of left knee  Stiffness of left knee, not elsewhere classified  Difficulty in walking, not elsewhere classified  Muscle weakness (generalized)  Rationale for Evaluation and Treatment: Rehabilitation  ONSET DATE: 10/07/22  SUBJECTIVE:   SUBJECTIVE STATEMENT: I think it's rougher than the first time, the nerve blocks did not last as long as last time so it's been more painful. I have been some exercises, I am walking every few hours, and icing.   PERTINENT HISTORY: R TKA 04/01/22 L TKA 10/07/22  PAIN:  Are you having pain? Yes: NPRS scale: 8/10 Pain location: L knee and in the thigh Pain description: stabbing, throbbing Aggravating factors: moving it mostly the bending, walking Relieving factors: ice, pain meds  PRECAUTIONS: None  WEIGHT BEARING RESTRICTIONS: No  FALLS:  Has patient fallen in last 6 months? No  LIVING ENVIRONMENT: Lives with: lives with their spouse Lives in: House/apartment Stairs: Yes: Internal: 15 steps; on right going up and External: 3 steps; grab bars Has following equipment at home: Single point cane and Walker - 2 wheeled  PLOF: Independent  PATIENT GOALS: to get back to normal and walk with normal gait and no pain  NEXT MD VISIT: 10/17/22  OBJECTIVE:   COGNITION: Overall cognitive status: Within functional limits for tasks assessed     SENSATION: WFL   MUSCLE LENGTH: Hamstrings: limited on L side   PALPATION: Warm and TTP around L knee  LOWER EXTREMITY ROM:  Active ROM Right eval Left eval  Hip flexion    Hip extension    Hip abduction    Hip adduction    Hip internal rotation    Hip external rotation    Knee flexion    Knee extension  -14  Ankle dorsiflexion  80  Ankle plantarflexion    Ankle inversion    Ankle eversion     (Blank rows = not  tested)  LOWER EXTREMITY MMT:  MMT Right eval Left eval  Hip flexion  2+  Hip extension    Hip abduction    Hip adduction    Hip internal rotation    Hip external rotation    Knee flexion  2+  Knee extension  2+  Ankle dorsiflexion    Ankle plantarflexion    Ankle inversion    Ankle eversion     (Blank rows = not tested)   FUNCTIONAL TESTS:  5 times sit to stand: 20.41s with UE push off and pain Timed up and go (TUG): 39.81s with RW   GAIT: Distance walked: from lobby to room Assistive device utilized: Environmental consultant - 2 wheeled Level of assistance: Modified independence Comments: antalgic gait, decrease Wbing on left  side, decrease stance time and step length on LLE   TODAY'S TREATMENT:                                                                                                                              DATE:  10/10/22- EVAL & Vaso to L knee 10 mins med pressure 35F    PATIENT EDUCATION:  Education details: POC and HEP Person educated: Patient Education method: Explanation Education comprehension: verbalized understanding  HOME EXERCISE PROGRAM: Access Code: [email or web address] URL: [email or web address] Date: 10/10/2022 Prepared by: [place] with Counter Support  - 1 x daily - 7 x weekly - 2 sets - 10 reps - Seated Heel Slide  - 1 x daily - 7 x weekly - 2 sets - 10 reps - Heel Raises with Counter Support  - 1 x daily - 7 x weekly - 2 sets - 10 reps - Supine Quad Set  - 1 x daily - 7 x weekly - 2 sets - 10 reps  ASSESSMENT:  CLINICAL IMPRESSION: Patient is a 77 y.o. female who was seen today for physical therapy evaluation and treatment s/p L TKA. Her surgery date was 10/07/22, she stayed one night in the hospital. Currently her pain levels are high and she reports the nerve blocks did not seem to last that long. She is ambulating with a rolling walker, and antaglic gait pattern. Patient has received PT for her R TKA last year and  knows what to expect. She will benefit from skilled PT to address her L knee pain and impairments to be able to walk normally and return to PLOF.   OBJECTIVE IMPAIRMENTS: Abnormal gait, decreased mobility, difficulty walking, decreased ROM, decreased strength, and pain.   ACTIVITY LIMITATIONS: standing, squatting, stairs, transfers, and locomotion level  PARTICIPATION LIMITATIONS: driving, shopping, and community activity  REHAB POTENTIAL: Good  CLINICAL DECISION MAKING: Stable/uncomplicated  EVALUATION COMPLEXITY: Low   GOALS: Goals reviewed with patient? Yes  SHORT TERM GOALS: Target date: 11/21/22  Patient will be independent with initial HEP. Goal status: INITIAL  2.  Patient will demonstrate 5xSTS <14s without UE push off Baseline: 20.41s Goal status: INITIAL  3.  Patient will decrease TUG < 20s Baseline: 39.81s Goal status: INITIAL   LONG TERM GOALS: Target date: 01/02/23  Patient will be independent with advanced/ongoing HEP to improve outcomes and carryover.  Goal status: INITIAL  2.  Patient will report at least 75% improvement in L knee pain to improve QOL. Baseline: 8/10 Goal status: INITIAL  3.  Patient will demonstrate improved L knee AROM to >/= 0-120 deg to allow for normal gait and stair mechanics. Baseline: 14-0-120 Goal status: INITIAL  4.  Patient will demonstrate improved functional LLE strength as demonstrated by 5/5. Baseline: 2+ Goal status: INITIAL  5.  Patient will be able to ambulate 600' with LRAD and normal gait pattern without increased pain to access community.  Baseline: RW antalgic  gait Goal status: INITIAL  6. Patient will decrease TUG to <12, no assistive device Baseline: 39.81s Goal status: INITIAL    PLAN:  PT FREQUENCY: 2x/week  PT DURATION: 12 weeks  PLANNED INTERVENTIONS: Therapeutic exercises, Therapeutic activity, Neuromuscular re-education, Balance training, Gait training, Patient/Family education, Self Care,  Joint mobilization, Stair training, Dry Needling, Electrical stimulation, Cryotherapy, Moist heat, Manual lymph drainage, scar mobilization, Vasopneumatic device, Ultrasound, Ionotophoresis 4mg /ml Dexamethasone, and Manual therapy  PLAN FOR NEXT SESSION: PROM, AAROM, light strengthening, functional strengthening   Joseh Sjogren, PT 10/10/2022, 1:15 PM

## 2022-10-10 NOTE — Addendum Note (Signed)
Addended by: Andris Baumann on: 10/10/2022 06:17 PM   Modules accepted: Orders

## 2022-10-14 NOTE — Discharge Summary (Signed)
Patient ID: [name] [name] MRN: [id number] DOB/AGE: [birth year] 77 y.o.  Admit date: 10/07/2022 Discharge date: 10/08/2022  Admission Diagnoses:  Principal Problem:   Primary osteoarthritis of left knee   Discharge Diagnoses:  Same  Past Medical History:  Diagnosis Date   Allergic rhinitis    Anxiety    Arthritis    Cataract    Depression    Diabetes mellitus without complication ([place])    borderline   Emphysema lung ([place])    Early stage   Endometrial polyp    Heart murmur    as a child   Hyperlipidemia    Hyperlipidemia    on simvastatin   Hypertension    Hypothyroidism    IBS (irritable bowel syndrome)    Obesity    Osteoarthritis    Seasonal allergies    Sleep apnea    no cpap   Vertigo 09/2022    Surgeries: Procedure(s): TOTAL KNEE ARTHROPLASTY on 10/07/2022   Consultants:   Discharged Condition: Improved  Hospital Course: [name] [name] is an 77 y.o. female who was admitted 10/07/2022 for operative treatment ofPrimary osteoarthritis of left knee. Patient has severe unremitting pain that affects sleep, daily activities, and work/hobbies. After pre-op clearance the patient was taken to the operating room on 10/07/2022 and underwent  Procedure(s): TOTAL KNEE ARTHROPLASTY.    Patient was given perioperative antibiotics:  Anti-infectives (From admission, onward)    Start     Dose/Rate Route Frequency Ordered Stop   10/07/22 1430  ceFAZolin (ANCEF) IVPB 2g/100 mL premix        2 g 200 mL/hr over 30 Minutes Intravenous Every 6 hours 10/07/22 1107 10/08/22 1340   10/07/22 0630  ceFAZolin (ANCEF) IVPB 2g/100 mL premix        2 g 200 mL/hr over 30 Minutes Intravenous On call to O.R. 10/07/22 [id number] 10/07/22 ID:[id number]        Patient was given sequential compression devices, early ambulation, and chemoprophylaxis to prevent DVT.  Patient benefited maximally from hospital stay and there were no complications.    Recent vital signs: No data found.   Recent laboratory  studies: No results for input(s): "WBC", "HGB", "HCT", "PLT", "NA", "K", "CL", "CO2", "BUN", "CREATININE", "GLUCOSE", "INR", "CALCIUM" in the last 72 hours.  Invalid input(s): "PT", "2"   Discharge Medications:   Allergies as of 10/08/2022       Reactions   Tape Other (See Comments)   Red, swollen, itching        Medication List     STOP taking these medications    Advil PM 200-25 MG Caps Generic drug: Ibuprofen-diphenhydrAMINE HCl       TAKE these medications    aspirin 81 MG chewable tablet Chew 1 tablet (81 mg total) by mouth 2 (two) times daily for 20 days. Then take one 81 mg aspirin once a day for three weeks. Then discontinue aspirin.   Biotin 5000 MCG Tabs Take 5,000 mcg by mouth every evening.   Centrum Silver tablet Take 1 tablet by mouth every evening.   COLLAGEN 1500/C PO Take 1 Scoop by mouth in the morning. With coffee   cycloSPORINE 0.05 % ophthalmic emulsion Commonly known as: RESTASIS Place 1 drop into both eyes 2 (two) times daily.   diphenhydramine-acetaminophen 25-500 MG Tabs tablet Commonly known as: TYLENOL PM Take 1 tablet by mouth at bedtime as needed (pain/sleep).   levothyroxine 125 MCG tablet Commonly known as: SYNTHROID TAKE 1 TABLET BY MOUTH DAILY  BEFORE BREAKFAST  What changed: when to take this   loperamide 2 MG capsule Commonly known as: IMODIUM TAKE 1 CAPSULE BY MOUTH  TWICE DAILY What changed:  when to take this additional instructions   meclizine 25 MG tablet Commonly known as: ANTIVERT Take 1 tablet (25 mg total) by mouth 3 (three) times daily as needed for dizziness.   Melatonin 3 MG Caps Take 3 mg by mouth at bedtime as needed (sleep).   metFORMIN 500 MG tablet Commonly known as: GLUCOPHAGE Take 1 tablet (500 mg total) by mouth daily with breakfast.   methocarbamol 500 MG tablet Commonly known as: ROBAXIN Take 1 tablet (500 mg total) by mouth every 6 (six) hours as needed for muscle spasms.   oxyCODONE 5  MG immediate release tablet Commonly known as: Oxy IR/ROXICODONE Take 1-2 tablets (5-10 mg total) by mouth every 6 (six) hours as needed for moderate pain or severe pain.   PreviDent 5000 Enamel Protect 1.1-5 % Gel Generic drug: Sod Fluoride-Potassium Nitrate Place 1 Application onto teeth at bedtime.   sertraline 100 MG tablet Commonly known as: ZOLOFT TAKE 1 TABLET BY MOUTH DAILY   simvastatin 20 MG tablet Commonly known as: ZOCOR TAKE 1 TABLET BY MOUTH AT  BEDTIME What changed:  how much to take how to take this when to take this additional instructions   Systane Complete 0.6 % Soln Generic drug: Propylene Glycol Place 1 drop into both eyes as needed (dry eyes).   Vitamin D (Ergocalciferol) 1.25 MG (50000 UNIT) Caps capsule Commonly known as: DRISDOL TAKE 1 CAPSULE BY MOUTH  EVERY 7 DAYS What changed: when to take this               Discharge Care Instructions  (From admission, onward)           Start     Ordered   10/08/22 0000  Weight bearing as tolerated        10/08/22 0738   10/08/22 0000  Change dressing       Comments: You may remove the bulky bandage (ACE wrap and gauze) two days after surgery. You will have an adhesive waterproof bandage underneath. Leave this in place until your first follow-up appointment.   10/08/22 0738            Diagnostic Studies: No results found.  Disposition: Discharge disposition: 01-Home or Self Care       Discharge Instructions     Call MD / Call 911   Complete by: As directed    If you experience chest pain or shortness of breath, CALL 911 and be transported to the hospital emergency room.  If you develope a fever above 101 F, pus (white drainage) or increased drainage or redness at the wound, or calf pain, call your surgeon's office.   Change dressing   Complete by: As directed    You may remove the bulky bandage (ACE wrap and gauze) two days after surgery. You will have an adhesive waterproof  bandage underneath. Leave this in place until your first follow-up appointment.   Constipation Prevention   Complete by: As directed    Drink plenty of fluids.  Prune juice may be helpful.  You may use a stool softener, such as Colace (over the counter) 100 mg twice a day.  Use MiraLax (over the counter) for constipation as needed.   Diet - low sodium heart healthy   Complete by: As directed    Do not put a pillow under the  knee. Place it under the heel.   Complete by: As directed    Driving restrictions   Complete by: As directed    No driving for two weeks   Post-operative opioid taper instructions:   Complete by: As directed    POST-OPERATIVE OPIOID TAPER INSTRUCTIONS: It is important to wean off of your opioid medication as soon as possible. If you do not need pain medication after your surgery it is ok to stop day one. Opioids include: Codeine, Hydrocodone(Norco, Vicodin), Oxycodone(Percocet, oxycontin) and hydromorphone amongst others.  Long term and even short term use of opiods can cause: Increased pain response Dependence Constipation Depression Respiratory depression And more.  Withdrawal symptoms can include Flu like symptoms Nausea, vomiting And more Techniques to manage these symptoms Hydrate well Eat regular healthy meals Stay active Use relaxation techniques(deep breathing, meditating, yoga) Do Not substitute Alcohol to help with tapering If you have been on opioids for less than two weeks and do not have pain than it is ok to stop all together.  Plan to wean off of opioids This plan should start within one week post op of your joint replacement. Maintain the same interval or time between taking each dose and first decrease the dose.  Cut the total daily intake of opioids by one tablet each day Next start to increase the time between doses. The last dose that should be eliminated is the evening dose.      TED hose   Complete by: As directed    Use stockings  (TED hose) for three weeks on both leg(s).  You may remove them at night for sleeping.   Weight bearing as tolerated   Complete by: As directed         Follow-up Information     Aluisio, Pilar Plate, MD. Schedule an appointment as soon as possible for a visit in 2 week(s).   Specialty: Orthopedic Surgery Contact information: [address] [place] [address] [phone]                  Signed: Theresa Duty 10/14/2022, 9:51 AM

## 2022-10-15 ENCOUNTER — Ambulatory Visit: Payer: 59 | Attending: Physician Assistant | Admitting: Physical Therapy

## 2022-10-15 ENCOUNTER — Encounter: Payer: Self-pay | Admitting: Physical Therapy

## 2022-10-15 DIAGNOSIS — R600 Localized edema: Secondary | ICD-10-CM | POA: Insufficient documentation

## 2022-10-15 DIAGNOSIS — M25562 Pain in left knee: Secondary | ICD-10-CM | POA: Diagnosis present

## 2022-10-15 DIAGNOSIS — M6281 Muscle weakness (generalized): Secondary | ICD-10-CM | POA: Insufficient documentation

## 2022-10-15 DIAGNOSIS — R262 Difficulty in walking, not elsewhere classified: Secondary | ICD-10-CM | POA: Diagnosis present

## 2022-10-15 DIAGNOSIS — M25662 Stiffness of left knee, not elsewhere classified: Secondary | ICD-10-CM | POA: Insufficient documentation

## 2022-10-15 NOTE — Therapy (Cosign Needed)
OUTPATIENT PHYSICAL THERAPY LOWER EXTREMITY TREATMENT   Patient Name: [name] [name] MRN: [id number] DOB:07-25-1945, 77 y.o., female Today's Date: 10/15/2022  END OF SESSION:  PT End of Session - 10/15/22 1300     Visit Number 2    Date for PT Re-Evaluation 01/02/23    PT Start Time 1300    PT Stop Time 1345    PT Time Calculation (min) 45 min             Past Medical History:  Diagnosis Date   Allergic rhinitis    Anxiety    Arthritis    Cataract    Depression    Diabetes mellitus without complication    borderline   Emphysema lung    Early stage   Endometrial polyp    Heart murmur    as a child   Hyperlipidemia    Hyperlipidemia    on simvastatin   Hypertension    Hypothyroidism    IBS (irritable bowel syndrome)    Obesity    Osteoarthritis    Seasonal allergies    Sleep apnea    no cpap   Vertigo 09/2022   Past Surgical History:  Procedure Laterality Date   BUNIONECTOMY  2009   Dr.Duda, left   CARPAL TUNNEL RELEASE Right 11/28/2021   Procedure: RIGHT CARPAL TUNNEL RELEASE;  Surgeon: Sherilyn Cooter, MD;  Location: [facility];  Service: Orthopedics;  Laterality: Right;   EYE SURGERY     HYSTEROSCOPY WITH D & C  04/24/2011   Procedure: DILATATION AND CURETTAGE (D&C) /HYSTEROSCOPY;  Surgeon: Eldred Manges, MD;  Location: [facility];  Service: Gynecology;  Laterality: N/A;   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   Dr Chrys Racer FASCIA SURGERY  1994   Dr Percell Miller   POLYPECTOMY  04/24/2011   Procedure: POLYPECTOMY;  Surgeon: Eldred Manges, MD;  Location: [facility];  Service: Gynecology;  Laterality: N/A;   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   Procedure: TOTAL KNEE ARTHROPLASTY;  Surgeon: Gaynelle Arabian, MD;  Location: [facility];  Service: Orthopedics;  Laterality: Right;   TOTAL KNEE ARTHROPLASTY Left 10/07/2022   Procedure: TOTAL KNEE ARTHROPLASTY;  Surgeon: Gaynelle Arabian, MD;  Location: [facility];  Service: Orthopedics;  Laterality:  Left;   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 with body mass index (BMI) of 37.0 to 37.9 in adult 02/11/2022   Weight loss observed on examination 02/11/2022   Pre-operative clearance 01/17/2022   Primary osteoarthritis of right knee 01/17/2022   Carpal tunnel syndrome, right upper limb 11/15/2021   Numbness 11/02/2021   OSA (obstructive sleep apnea) 07/24/2020   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/13/2020   Upper airway cough syndrome 06/13/2020   Class 3 severe obesity due to excess calories with serious comorbidity and body mass index (BMI) of 45.0 to 49.9 in adult 06/13/2020   MCI (mild cognitive impairment) 06/13/2020   Insulin resistance 05/09/2020   Insomnia 05/09/2020   Vitamin D deficiency 02/17/2019   Diarrhea 02/17/2019   Essential hypertension 02/17/2019   Class 3 severe obesity due to excess calories with serious comorbidity and body mass index (BMI) of 40.0 to 44.9 in adult 02/17/2019   Hair loss 02/17/2019   Anxiety 11/30/2017   Primary osteoarthritis of both knees 11/30/2017  Abnormal WBC count 11/30/2017   Obesity (BMI 30-39.9) 07/23/2013   Suspicious mole 02/01/2013   Palpitations 07/23/2012   Hyperlipidemia 01/28/2012   Morbid obesity ([place]) 04/24/2011   Preventative health care 02/20/2011   Skin lump of leg 10/10/2010   SORE THROAT 08/15/2010   LYMPHADENOPATHY 08/15/2010   TOBACCO USE 01/17/2010   Hyperlipidemia associated with type 2 diabetes mellitus 04/05/2008   TINNITUS, CHRONIC, 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   ALLERGIC RHINITIS 01/25/2008   LIVER FUNCTION TESTS, ABNORMAL 01/25/2008   Hypothyroidism 01/13/2007   Depression with anxiety 01/13/2007   TRANSIENT GLOBAL AMNESIA 01/13/2007   FOOT SURGERY, HX OF 01/13/2007    PCP: Jodi Mourning  REFERRING  PROVIDER: Shearon Balo  REFERRING DIAG: s/p L TKA  THERAPY DIAG:  Acute pain of left knee  Stiffness of left knee, not elsewhere classified  Difficulty in walking, not elsewhere classified  Muscle weakness (generalized)  Localized edema  Rationale for Evaluation and Treatment: Rehabilitation  ONSET DATE: 10/07/22  SUBJECTIVE:   SUBJECTIVE STATEMENT: "Not real good" Took pain pill too late  PERTINENT HISTORY: R TKA 04/01/22 L TKA 10/07/22  PAIN:  Are you having pain? Yes: NPRS scale: 4/10 Pain location: L knee and in the thigh Pain description: stabbing, throbbing Aggravating factors: moving it mostly the bending, walking Relieving factors: ice, pain meds  PRECAUTIONS: None  WEIGHT BEARING RESTRICTIONS: No  FALLS:  Has patient fallen in last 6 months? No  LIVING ENVIRONMENT: Lives with: lives with their spouse Lives in: House/apartment Stairs: Yes: Internal: 15 steps; on right going up and External: 3 steps; grab bars Has following equipment at home: Single point cane and Walker - 2 wheeled  PLOF: Independent  PATIENT GOALS: to get back to normal and walk with normal gait and no pain  NEXT MD VISIT: 10/17/22  OBJECTIVE:   COGNITION: Overall cognitive status: Within functional limits for tasks assessed     SENSATION: WFL   MUSCLE LENGTH: Hamstrings: limited on L side   PALPATION: Warm and TTP around L knee  LOWER EXTREMITY ROM:  Active ROM Right eval Left eval  Hip flexion    Hip extension    Hip abduction    Hip adduction    Hip internal rotation    Hip external rotation    Knee flexion    Knee extension  -14  Ankle dorsiflexion  80  Ankle plantarflexion    Ankle inversion    Ankle eversion     (Blank rows = not tested)  LOWER EXTREMITY MMT:  MMT Right eval Left eval  Hip flexion  2+  Hip extension    Hip abduction    Hip adduction    Hip internal rotation    Hip external rotation    Knee flexion  2+  Knee extension   2+  Ankle dorsiflexion    Ankle plantarflexion    Ankle inversion    Ankle eversion     (Blank rows = not tested)   FUNCTIONAL TESTS:  5 times sit to stand: 20.41s with UE push off and pain Timed up and go (TUG): 39.81s with RW   GAIT: Distance walked: from lobby to room Assistive device utilized: Environmental consultant - 2 wheeled Level of assistance: Modified independence Comments: antalgic gait, decrease Wbing on left side, decrease stance time and step length on LLE   TODAY'S TREATMENT:  DATE:  10/15/22 NuStep L3 x 6 min  L knee PROM flex and Ext L knee LAQ 2x10 LLE HS curls red 2x10 Quad sets x10 LLE  S2S x5 elevated surface  Standing march x5 HHA x5 Vaso to L knee 10 mins med pressure 41F   10/10/22- EVAL & Vaso to L knee 10 mins med pressure 41F    PATIENT EDUCATION:  Education details: POC and HEP Person educated: Patient Education method: Explanation Education comprehension: verbalized understanding  HOME EXERCISE PROGRAM: Access Code: [email or web address] URL: [email or web address] Date: 10/10/2022 Prepared by: [place] with Counter Support  - 1 x daily - 7 x weekly - 2 sets - 10 reps - Seated Heel Slide  - 1 x daily - 7 x weekly - 2 sets - 10 reps - Heel Raises with Counter Support  - 1 x daily - 7 x weekly - 2 sets - 10 reps - Supine Quad Set  - 1 x daily - 7 x weekly - 2 sets - 10 reps  ASSESSMENT:  CLINICAL IMPRESSION: Patient is a 77 y.o. female who was seen today for physical therapy  treatment s/p L TKA. Her surgery date was 10/07/22. Pt enters with some swelling in the L knee. Some pain reported with PROM. Some compensation noted with LAQ. Hesitation with S2S and marches.    OBJECTIVE IMPAIRMENTS: Abnormal gait, decreased mobility, difficulty walking, decreased ROM, decreased strength, and pain.   ACTIVITY  LIMITATIONS: standing, squatting, stairs, transfers, and locomotion level  PARTICIPATION LIMITATIONS: driving, shopping, and community activity  REHAB POTENTIAL: Good  CLINICAL DECISION MAKING: Stable/uncomplicated  EVALUATION COMPLEXITY: Low   GOALS: Goals reviewed with patient? Yes  SHORT TERM GOALS: Target date: 11/21/22  Patient will be independent with initial HEP. Goal status: INITIAL  2.  Patient will demonstrate 5xSTS <14s without UE push off Baseline: 20.41s Goal status: INITIAL  3.  Patient will decrease TUG < 20s Baseline: 39.81s Goal status: INITIAL   LONG TERM GOALS: Target date: 01/02/23  Patient will be independent with advanced/ongoing HEP to improve outcomes and carryover.  Goal status: INITIAL  2.  Patient will report at least 75% improvement in L knee pain to improve QOL. Baseline: 8/10 Goal status: INITIAL  3.  Patient will demonstrate improved L knee AROM to >/= 0-120 deg to allow for normal gait and stair mechanics. Baseline: 14-0-120 Goal status: INITIAL  4.  Patient will demonstrate improved functional LLE strength as demonstrated by 5/5. Baseline: 2+ Goal status: INITIAL  5.  Patient will be able to ambulate 600' with LRAD and normal gait pattern without increased pain to access community.  Baseline: RW antalgic gait Goal status: INITIAL  6. Patient will decrease TUG to <12, no assistive device Baseline: 39.81s Goal status: INITIAL    PLAN:  PT FREQUENCY: 2x/week  PT DURATION: 12 weeks  PLANNED INTERVENTIONS: Therapeutic exercises, Therapeutic activity, Neuromuscular re-education, Balance training, Gait training, Patient/Family education, Self Care, Joint mobilization, Stair training, Dry Needling, Electrical stimulation, Cryotherapy, Moist heat, Manual lymph drainage, scar mobilization, Vasopneumatic device, Ultrasound, Ionotophoresis 4mg /ml Dexamethasone, and Manual therapy  PLAN FOR NEXT SESSION: PROM, AAROM, light strengthening,  functional strengthening   Scot Jun, PTA 10/15/2022, 1:01 PM

## 2022-10-17 ENCOUNTER — Encounter: Payer: Self-pay | Admitting: Physical Therapy

## 2022-10-17 ENCOUNTER — Ambulatory Visit: Payer: 59 | Admitting: Physical Therapy

## 2022-10-17 DIAGNOSIS — M25562 Pain in left knee: Secondary | ICD-10-CM | POA: Diagnosis not present

## 2022-10-17 DIAGNOSIS — M6281 Muscle weakness (generalized): Secondary | ICD-10-CM

## 2022-10-17 DIAGNOSIS — M25662 Stiffness of left knee, not elsewhere classified: Secondary | ICD-10-CM

## 2022-10-17 DIAGNOSIS — R600 Localized edema: Secondary | ICD-10-CM

## 2022-10-17 DIAGNOSIS — R262 Difficulty in walking, not elsewhere classified: Secondary | ICD-10-CM

## 2022-10-17 NOTE — Therapy (Signed)
OUTPATIENT PHYSICAL THERAPY LOWER EXTREMITY TREATMENT   Patient Name: [name] [name] MRN: [id number] DOB:08/24/1945, 77 y.o., female Today's Date: 10/17/2022  END OF SESSION:  PT End of Session - 10/17/22 1303     Visit Number 3    Date for PT Re-Evaluation 01/02/23    PT Start Time 1300    PT Stop Time 1355    PT Time Calculation (min) 55 min    Activity Tolerance Patient tolerated treatment well    Behavior During Therapy [facility] for tasks assessed/performed             Past Medical History:  Diagnosis Date   Allergic rhinitis    Anxiety    Arthritis    Cataract    Depression    Diabetes mellitus without complication    borderline   Emphysema lung    Early stage   Endometrial polyp    Heart murmur    as a child   Hyperlipidemia    Hyperlipidemia    on simvastatin   Hypertension    Hypothyroidism    IBS (irritable bowel syndrome)    Obesity    Osteoarthritis    Seasonal allergies    Sleep apnea    no cpap   Vertigo 09/2022   Past Surgical History:  Procedure Laterality Date   BUNIONECTOMY  2009   Dr.Duda, left   CARPAL TUNNEL RELEASE Right 11/28/2021   Procedure: RIGHT CARPAL TUNNEL RELEASE;  Surgeon: Sherilyn Cooter, MD;  Location: [facility];  Service: Orthopedics;  Laterality: Right;   EYE SURGERY     HYSTEROSCOPY WITH D & C  04/24/2011   Procedure: DILATATION AND CURETTAGE (D&C) /HYSTEROSCOPY;  Surgeon: Eldred Manges, MD;  Location: [facility];  Service: Gynecology;  Laterality: N/A;   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   Dr Chrys Racer FASCIA SURGERY  1994   Dr Percell Miller   POLYPECTOMY  04/24/2011   Procedure: POLYPECTOMY;  Surgeon: Eldred Manges, MD;  Location: [facility];  Service: Gynecology;  Laterality: N/A;   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   Procedure: TOTAL KNEE ARTHROPLASTY;  Surgeon: Gaynelle Arabian, MD;  Location: [facility];  Service: Orthopedics;  Laterality: Right;   TOTAL KNEE ARTHROPLASTY Left 10/07/2022    Procedure: TOTAL KNEE ARTHROPLASTY;  Surgeon: Gaynelle Arabian, MD;  Location: [facility];  Service: Orthopedics;  Laterality: Left;   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 with body mass index (BMI) of 37.0 to 37.9 in adult 02/11/2022   Weight loss observed on examination 02/11/2022   Pre-operative clearance 01/17/2022   Primary osteoarthritis of right knee 01/17/2022   Carpal tunnel syndrome, right upper limb 11/15/2021   Numbness 11/02/2021   OSA (obstructive sleep apnea) 07/24/2020   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/13/2020   Upper airway cough syndrome 06/13/2020   Class 3 severe obesity due to excess calories with serious comorbidity and body mass index (BMI) of 45.0 to 49.9 in adult 06/13/2020   MCI (mild cognitive impairment) 06/13/2020   Insulin resistance 05/09/2020   Insomnia 05/09/2020   Vitamin D deficiency 02/17/2019   Diarrhea 02/17/2019   Essential hypertension 02/17/2019   Class 3 severe obesity due to excess calories with serious comorbidity and body mass index (BMI) of 40.0 to 44.9 in adult  02/17/2019   Hair loss 02/17/2019   Anxiety 11/30/2017   Primary osteoarthritis of both knees 11/30/2017   Abnormal WBC count 11/30/2017   Obesity (BMI 30-39.9) 07/23/2013   Suspicious mole 02/01/2013   Palpitations 07/23/2012   Hyperlipidemia 01/28/2012   Morbid obesity ([place]) 04/24/2011   Preventative health care 02/20/2011   Skin lump of leg 10/10/2010   SORE THROAT 08/15/2010   LYMPHADENOPATHY 08/15/2010   TOBACCO USE 01/17/2010   Hyperlipidemia associated with type 2 diabetes mellitus 04/05/2008   TINNITUS, CHRONIC, 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   ALLERGIC RHINITIS 01/25/2008   LIVER FUNCTION TESTS, ABNORMAL 01/25/2008   Hypothyroidism 01/13/2007   Depression with anxiety  01/13/2007   TRANSIENT GLOBAL AMNESIA 01/13/2007   FOOT SURGERY, HX OF 01/13/2007    PCP: Jodi Mourning  REFERRING PROVIDER: Shearon Balo  REFERRING DIAG: s/p L TKA  THERAPY DIAG:  Acute pain of left knee  Stiffness of left knee, not elsewhere classified  Difficulty in walking, not elsewhere classified  Muscle weakness (generalized)  Localized edema  Rationale for Evaluation and Treatment: Rehabilitation  ONSET DATE: 10/07/22  SUBJECTIVE:   SUBJECTIVE STATEMENT: "Sleepy " from pain meds  PERTINENT HISTORY: R TKA 04/01/22 L TKA 10/07/22  PAIN:  Are you having pain? Yes: NPRS scale: 4/10 Pain location: L knee and in the thigh Pain description: stabbing, throbbing Aggravating factors: moving it mostly the bending, walking Relieving factors: ice, pain meds  PRECAUTIONS: None  WEIGHT BEARING RESTRICTIONS: No  FALLS:  Has patient fallen in last 6 months? No  LIVING ENVIRONMENT: Lives with: lives with their spouse Lives in: House/apartment Stairs: Yes: Internal: 15 steps; on right going up and External: 3 steps; grab bars Has following equipment at home: Single point cane and Walker - 2 wheeled  PLOF: Independent  PATIENT GOALS: to get back to normal and walk with normal gait and no pain  NEXT MD VISIT: 10/17/22  OBJECTIVE:   COGNITION: Overall cognitive status: Within functional limits for tasks assessed     SENSATION: WFL   MUSCLE LENGTH: Hamstrings: limited on L side   PALPATION: Warm and TTP around L knee  LOWER EXTREMITY ROM:  Active ROM Right eval Left eval  Hip flexion    Hip extension    Hip abduction    Hip adduction    Hip internal rotation    Hip external rotation    Knee flexion    Knee extension  -14  Ankle dorsiflexion  80  Ankle plantarflexion    Ankle inversion    Ankle eversion     (Blank rows = not tested)  LOWER EXTREMITY MMT:  MMT Right eval Left eval  Hip flexion  2+  Hip extension    Hip abduction     Hip adduction    Hip internal rotation    Hip external rotation    Knee flexion  2+  Knee extension  2+  Ankle dorsiflexion    Ankle plantarflexion    Ankle inversion    Ankle eversion     (Blank rows = not tested)   FUNCTIONAL TESTS:  5 times sit to stand: 20.41s with UE push off and pain Timed up and go (TUG): 39.81s with RW   GAIT: Distance walked: from lobby to room Assistive device utilized: Environmental consultant - 2 wheeled Level of assistance: Modified independence Comments: antalgic gait, decrease Wbing on left side, decrease stance time and step length on LLE   TODAY'S TREATMENT:  DATE:  10/17/22 NuStep L4 x 6 min  L knee PROM flex and Ext LLE HS curls red 2x15 L knee LAQ 2lb 2x15 Gait 280ft 2 laps with SPC S2S slightly elevated surface Standing march w/ SPC Vaso to L knee 10 mins med pressure 65F   10/15/22 NuStep L3 x 6 min  L knee PROM flex and Ext L knee LAQ 2x10 LLE HS curls red 2x10 Quad sets x10 LLE  S2S x5 elevated surface  Standing march x5 HHA x5 Vaso to L knee 10 mins med pressure 65F   10/10/22- EVAL & Vaso to L knee 10 mins med pressure 65F    PATIENT EDUCATION:  Education details: POC and HEP Person educated: Patient Education method: Explanation Education comprehension: verbalized understanding  HOME EXERCISE PROGRAM: Access Code: [email or web address] URL: [email or web address] Date: 10/10/2022 Prepared by: [place] with Counter Support  - 1 x daily - 7 x weekly - 2 sets - 10 reps - Seated Heel Slide  - 1 x daily - 7 x weekly - 2 sets - 10 reps - Heel Raises with Counter Support  - 1 x daily - 7 x weekly - 2 sets - 10 reps - Supine Quad Set  - 1 x daily - 7 x weekly - 2 sets - 10 reps  ASSESSMENT:  CLINICAL IMPRESSION: Patient is a 77 y.o. female who was seen today for physical therapy   treatment s/p L TKA. Her surgery date was 10/07/22. Pt enters with some swelling in the L knee. Pain reported with passive L knee flexion.. Cues to prevent posterior trunk leaning with LAQ. Progressed to ambulation with SPC. Some instability and hesitation with standing marches..    OBJECTIVE IMPAIRMENTS: Abnormal gait, decreased mobility, difficulty walking, decreased ROM, decreased strength, and pain.   ACTIVITY LIMITATIONS: standing, squatting, stairs, transfers, and locomotion level  PARTICIPATION LIMITATIONS: driving, shopping, and community activity  REHAB POTENTIAL: Good  CLINICAL DECISION MAKING: Stable/uncomplicated  EVALUATION COMPLEXITY: Low   GOALS: Goals reviewed with patient? Yes  SHORT TERM GOALS: Target date: 11/21/22  Patient will be independent with initial HEP. Goal status: Progressing  2.  Patient will demonstrate 5xSTS <14s without UE push off Baseline: 20.41s Goal status: INITIAL  3.  Patient will decrease TUG < 20s Baseline: 39.81s Goal status: INITIAL   LONG TERM GOALS: Target date: 01/02/23  Patient will be independent with advanced/ongoing HEP to improve outcomes and carryover.  Goal status: INITIAL  2.  Patient will report at least 75% improvement in L knee pain to improve QOL. Baseline: 8/10 Goal status: INITIAL  3.  Patient will demonstrate improved L knee AROM to >/= 0-120 deg to allow for normal gait and stair mechanics. Baseline: 14-0-120 Goal status: INITIAL  4.  Patient will demonstrate improved functional LLE strength as demonstrated by 5/5. Baseline: 2+ Goal status: INITIAL  5.  Patient will be able to ambulate 600' with LRAD and normal gait pattern without increased pain to access community.  Baseline: RW antalgic gait Goal status: INITIAL  6. Patient will decrease TUG to <12, no assistive device Baseline: 39.81s Goal status: INITIAL    PLAN:  PT FREQUENCY: 2x/week  PT DURATION: 12 weeks  PLANNED INTERVENTIONS:  Therapeutic exercises, Therapeutic activity, Neuromuscular re-education, Balance training, Gait training, Patient/Family education, Self Care, Joint mobilization, Stair training, Dry Needling, Electrical stimulation, Cryotherapy, Moist heat, Manual lymph drainage, scar mobilization, Vasopneumatic device, Ultrasound, Ionotophoresis 4mg /ml Dexamethasone, and Manual therapy  PLAN FOR NEXT SESSION: PROM,  AAROM, light strengthening, functional strengthening   Scot Jun, PTA 10/17/2022, 1:42 PM

## 2022-10-22 ENCOUNTER — Encounter: Payer: Self-pay | Admitting: Physical Therapy

## 2022-10-22 ENCOUNTER — Ambulatory Visit: Payer: 59 | Admitting: Physical Therapy

## 2022-10-22 DIAGNOSIS — M6281 Muscle weakness (generalized): Secondary | ICD-10-CM

## 2022-10-22 DIAGNOSIS — R600 Localized edema: Secondary | ICD-10-CM

## 2022-10-22 DIAGNOSIS — M25562 Pain in left knee: Secondary | ICD-10-CM | POA: Diagnosis not present

## 2022-10-22 DIAGNOSIS — R262 Difficulty in walking, not elsewhere classified: Secondary | ICD-10-CM

## 2022-10-22 DIAGNOSIS — M25662 Stiffness of left knee, not elsewhere classified: Secondary | ICD-10-CM

## 2022-10-22 NOTE — Therapy (Signed)
OUTPATIENT PHYSICAL THERAPY LOWER EXTREMITY TREATMENT   Patient Name: [name] [name] MRN: [id number] DOB:Sep 21, 1945, 77 y.o., female Today's Date: 10/22/2022  END OF SESSION:  PT End of Session - 10/22/22 1303     Visit Number 4    Date for PT Re-Evaluation 01/02/23    PT Start Time 1300    PT Stop Time 1345    PT Time Calculation (min) 45 min    Activity Tolerance Patient tolerated treatment well    Behavior During Therapy [facility] for tasks assessed/performed             Past Medical History:  Diagnosis Date   Allergic rhinitis    Anxiety    Arthritis    Cataract    Depression    Diabetes mellitus without complication    borderline   Emphysema lung    Early stage   Endometrial polyp    Heart murmur    as a child   Hyperlipidemia    Hyperlipidemia    on simvastatin   Hypertension    Hypothyroidism    IBS (irritable bowel syndrome)    Obesity    Osteoarthritis    Seasonal allergies    Sleep apnea    no cpap   Vertigo 09/2022   Past Surgical History:  Procedure Laterality Date   BUNIONECTOMY  2009   Dr.Duda, left   CARPAL TUNNEL RELEASE Right 11/28/2021   Procedure: RIGHT CARPAL TUNNEL RELEASE;  Surgeon: Marlyne BeardsBenfield, Charlie, MD;  Location: [facility];  Service: Orthopedics;  Laterality: Right;   EYE SURGERY     HYSTEROSCOPY WITH D & C  04/24/2011   Procedure: DILATATION AND CURETTAGE (D&C) /HYSTEROSCOPY;  Surgeon: Hal MoralesVanessa P Haygood, MD;  Location: [facility];  Service: Gynecology;  Laterality: N/A;   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   Dr Harriet PhoBates   PLANTAR FASCIA SURGERY  1994   Dr Eulah PontMurphy   POLYPECTOMY  04/24/2011   Procedure: POLYPECTOMY;  Surgeon: Hal MoralesVanessa P Haygood, MD;  Location: [facility];  Service: Gynecology;  Laterality: N/A;   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   Procedure: TOTAL KNEE ARTHROPLASTY;  Surgeon: Ollen GrossAluisio, Frank, MD;  Location: [facility];  Service: Orthopedics;  Laterality: Right;   TOTAL KNEE ARTHROPLASTY Left 10/07/2022    Procedure: TOTAL KNEE ARTHROPLASTY;  Surgeon: Ollen GrossAluisio, Frank, MD;  Location: [facility];  Service: Orthopedics;  Laterality: Left;   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 with body mass index (BMI) of 37.0 to 37.9 in adult 02/11/2022   Weight loss observed on examination 02/11/2022   Pre-operative clearance 01/17/2022   Primary osteoarthritis of right knee 01/17/2022   Carpal tunnel syndrome, right upper limb 11/15/2021   Numbness 11/02/2021   OSA (obstructive sleep apnea) 07/24/2020   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/13/2020   Upper airway cough syndrome 06/13/2020   Class 3 severe obesity due to excess calories with serious comorbidity and body mass index (BMI) of 45.0 to 49.9 in adult 06/13/2020   MCI (mild cognitive impairment) 06/13/2020   Insulin resistance 05/09/2020   Insomnia 05/09/2020   Vitamin D deficiency 02/17/2019   Diarrhea 02/17/2019   Essential hypertension 02/17/2019   Class 3 severe obesity due to excess calories with serious comorbidity and body mass index (BMI) of 40.0 to 44.9 in adult  02/17/2019   Hair loss 02/17/2019   Anxiety 11/30/2017   Primary osteoarthritis of both knees 11/30/2017   Abnormal WBC count 11/30/2017   Obesity (BMI 30-39.9) 07/23/2013   Suspicious mole 02/01/2013   Palpitations 07/23/2012   Hyperlipidemia 01/28/2012   Morbid obesity (HCC) 04/24/2011   Preventative health care 02/20/2011   Skin lump of leg 10/10/2010   SORE THROAT 08/15/2010   LYMPHADENOPATHY 08/15/2010   TOBACCO USE 01/17/2010   Hyperlipidemia associated with type 2 diabetes mellitus 04/05/2008   TINNITUS, CHRONIC, 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   ALLERGIC RHINITIS 01/25/2008   LIVER FUNCTION TESTS, ABNORMAL 01/25/2008   Hypothyroidism 01/13/2007   Depression with anxiety  01/13/2007   TRANSIENT GLOBAL AMNESIA 01/13/2007   FOOT SURGERY, HX OF 01/13/2007    PCP: Ria Clock  REFERRING PROVIDER: Weston Brass  REFERRING DIAG: s/p L TKA  THERAPY DIAG:  Acute pain of left knee  Stiffness of left knee, not elsewhere classified  Difficulty in walking, not elsewhere classified  Localized edema  Muscle weakness (generalized)  Rationale for Evaluation and Treatment: Rehabilitation  ONSET DATE: 10/07/22  SUBJECTIVE:   SUBJECTIVE STATEMENT: "I am very well"  PERTINENT HISTORY: R TKA 04/01/22 L TKA 10/07/22  PAIN:  Are you having pain? Yes: NPRS scale: 1/10 Pain location: L knee and in the thigh Pain description: stabbing, throbbing Aggravating factors: moving it mostly the bending, walking Relieving factors: ice, pain meds  PRECAUTIONS: None  WEIGHT BEARING RESTRICTIONS: No  FALLS:  Has patient fallen in last 6 months? No  LIVING ENVIRONMENT: Lives with: lives with their spouse Lives in: House/apartment Stairs: Yes: Internal: 15 steps; on right going up and External: 3 steps; grab bars Has following equipment at home: Single point cane and Walker - 2 wheeled  PLOF: Independent  PATIENT GOALS: to get back to normal and walk with normal gait and no pain  NEXT MD VISIT: 10/17/22  OBJECTIVE:   COGNITION: Overall cognitive status: Within functional limits for tasks assessed     SENSATION: WFL   MUSCLE LENGTH: Hamstrings: limited on L side   PALPATION: Warm and TTP around L knee  LOWER EXTREMITY ROM:  Active ROM Right eval Left eval  Hip flexion    Hip extension    Hip abduction    Hip adduction    Hip internal rotation    Hip external rotation    Knee flexion    Knee extension  -14  Ankle dorsiflexion  80  Ankle plantarflexion    Ankle inversion    Ankle eversion     (Blank rows = not tested)  LOWER EXTREMITY MMT:  MMT Right eval Left eval  Hip flexion  2+  Hip extension    Hip abduction    Hip  adduction    Hip internal rotation    Hip external rotation    Knee flexion  2+  Knee extension  2+  Ankle dorsiflexion    Ankle plantarflexion    Ankle inversion    Ankle eversion     (Blank rows = not tested)   FUNCTIONAL TESTS:  5 times sit to stand: 20.41s with UE push off and pain Timed up and go (TUG): 39.81s with RW   GAIT: Distance walked: from lobby to room Assistive device utilized: Environmental consultant - 2 wheeled Level of assistance: Modified independence Comments: antalgic gait, decrease Wbing on left side, decrease stance time and step length on LLE   TODAY'S TREATMENT:  DATE:  10/21/21 NuStep L4 x 6 min  L knee PROM flex and Ext S2S 2x10 Hamstring curls green LLE 2x15 LAQ LLE 2lb 2x10 Stair training 2 rails alternating pattern 4 & 6in Vaso to L knee 10 mins med pressure 52F   10/17/22 NuStep L4 x 6 min  L knee PROM flex and Ext LLE HS curls red 2x15 L knee LAQ 2lb 2x15 Gait 238ft 2 laps with SPC S2S slightly elevated surface Standing march w/ SPC Vaso to L knee 10 mins med pressure 52F   10/15/22 NuStep L3 x 6 min  L knee PROM flex and Ext L knee LAQ 2x10 LLE HS curls red 2x10 Quad sets x10 LLE  S2S x5 elevated surface  Standing march x5 HHA x5 Vaso to L knee 10 mins med pressure 52F   10/10/22- EVAL & Vaso to L knee 10 mins med pressure 52F    PATIENT EDUCATION:  Education details: POC and HEP Person educated: Patient Education method: Explanation Education comprehension: verbalized understanding  HOME EXERCISE PROGRAM: Access Code: [email or web address] URL: [email or web address] Date: 10/10/2022 Prepared by: Cassie Freer  Exercises - Mini Squat with Counter Support  - 1 x daily - 7 x weekly - 2 sets - 10 reps - Seated Heel Slide  - 1 x daily - 7 x weekly - 2 sets - 10 reps - Heel Raises with Counter Support  - 1 x daily - 7  x weekly - 2 sets - 10 reps - Supine Quad Set  - 1 x daily - 7 x weekly - 2 sets - 10 reps  ASSESSMENT:  CLINICAL IMPRESSION: Patient is a 77 y.o. female who was seen today for physical therapy  treatment s/p L TKA. Her surgery date was 10/07/22. Pt enters doing well ambulating with SPC. We did one gait trial ~70 feet without AD. Pain reported with passive L knee flexion. Some hesitation with eccentric control of LLE when descending stairs.    OBJECTIVE IMPAIRMENTS: Abnormal gait, decreased mobility, difficulty walking, decreased ROM, decreased strength, and pain.   ACTIVITY LIMITATIONS: standing, squatting, stairs, transfers, and locomotion level  PARTICIPATION LIMITATIONS: driving, shopping, and community activity  REHAB POTENTIAL: Good  CLINICAL DECISION MAKING: Stable/uncomplicated  EVALUATION COMPLEXITY: Low   GOALS: Goals reviewed with patient? Yes  SHORT TERM GOALS: Target date: 11/21/22  Patient will be independent with initial HEP. Goal status: Progressing  2.  Patient will demonstrate 5xSTS <14s without UE push off Baseline: 20.41s Goal status: INITIAL  3.  Patient will decrease TUG < 20s Baseline: 39.81s Goal status: INITIAL   LONG TERM GOALS: Target date: 01/02/23  Patient will be independent with advanced/ongoing HEP to improve outcomes and carryover.  Goal status: INITIAL  2.  Patient will report at least 75% improvement in L knee pain to improve QOL. Baseline: 8/10 Goal status: INITIAL  3.  Patient will demonstrate improved L knee AROM to >/= 0-120 deg to allow for normal gait and stair mechanics. Baseline: 14-0-120 Goal status: INITIAL  4.  Patient will demonstrate improved functional LLE strength as demonstrated by 5/5. Baseline: 2+ Goal status: INITIAL  5.  Patient will be able to ambulate 600' with LRAD and normal gait pattern without increased pain to access community.  Baseline: RW antalgic gait Goal status: INITIAL  6. Patient will  decrease TUG to <12, no assistive device Baseline: 39.81s Goal status: INITIAL    PLAN:  PT FREQUENCY: 2x/week  PT DURATION: 12 weeks  PLANNED INTERVENTIONS: Therapeutic exercises, Therapeutic activity,  Neuromuscular re-education, Balance training, Gait training, Patient/Family education, Self Care, Joint mobilization, Stair training, Dry Needling, Electrical stimulation, Cryotherapy, Moist heat, Manual lymph drainage, scar mobilization, Vasopneumatic device, Ultrasound, Ionotophoresis 4mg /ml Dexamethasone, and Manual therapy  PLAN FOR NEXT SESSION: PROM, AAROM, light strengthening, functional strengthening   Grayce Sessions, PTA 10/22/2022, 1:04 PM

## 2022-10-24 ENCOUNTER — Encounter: Payer: Self-pay | Admitting: Physical Therapy

## 2022-10-24 ENCOUNTER — Ambulatory Visit: Payer: 59 | Admitting: Physical Therapy

## 2022-10-24 DIAGNOSIS — M25562 Pain in left knee: Secondary | ICD-10-CM

## 2022-10-24 DIAGNOSIS — R262 Difficulty in walking, not elsewhere classified: Secondary | ICD-10-CM

## 2022-10-24 DIAGNOSIS — M6281 Muscle weakness (generalized): Secondary | ICD-10-CM

## 2022-10-24 DIAGNOSIS — R600 Localized edema: Secondary | ICD-10-CM

## 2022-10-24 DIAGNOSIS — M25662 Stiffness of left knee, not elsewhere classified: Secondary | ICD-10-CM

## 2022-10-24 NOTE — Therapy (Signed)
OUTPATIENT PHYSICAL THERAPY LOWER EXTREMITY TREATMENT   Patient Name: [name] [name] MRN: [id number] DOB:1946-02-27, 77 y.o., female Today's Date: 10/24/2022  END OF SESSION:  PT End of Session - 10/24/22 1258     Visit Number 5    Date for PT Re-Evaluation 01/02/23    PT Start Time 1300    PT Stop Time 1345    PT Time Calculation (min) 45 min    Activity Tolerance Patient tolerated treatment well    Behavior During Therapy [facility] for tasks assessed/performed             Past Medical History:  Diagnosis Date   Allergic rhinitis    Anxiety    Arthritis    Cataract    Depression    Diabetes mellitus without complication    borderline   Emphysema lung    Early stage   Endometrial polyp    Heart murmur    as a child   Hyperlipidemia    Hyperlipidemia    on simvastatin   Hypertension    Hypothyroidism    IBS (irritable bowel syndrome)    Obesity    Osteoarthritis    Seasonal allergies    Sleep apnea    no cpap   Vertigo 09/2022   Past Surgical History:  Procedure Laterality Date   BUNIONECTOMY  2009   Dr.Duda, left   CARPAL TUNNEL RELEASE Right 11/28/2021   Procedure: RIGHT CARPAL TUNNEL RELEASE;  Surgeon: Marlyne BeardsBenfield, Charlie, MD;  Location: [facility];  Service: Orthopedics;  Laterality: Right;   EYE SURGERY     HYSTEROSCOPY WITH D & C  04/24/2011   Procedure: DILATATION AND CURETTAGE (D&C) /HYSTEROSCOPY;  Surgeon: Hal MoralesVanessa P Haygood, MD;  Location: [facility];  Service: Gynecology;  Laterality: N/A;   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   Dr Harriet PhoBates   PLANTAR FASCIA SURGERY  1994   Dr Eulah PontMurphy   POLYPECTOMY  04/24/2011   Procedure: POLYPECTOMY;  Surgeon: Hal MoralesVanessa P Haygood, MD;  Location: [facility];  Service: Gynecology;  Laterality: N/A;   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   Procedure: TOTAL KNEE ARTHROPLASTY;  Surgeon: Ollen GrossAluisio, Frank, MD;  Location: [facility];  Service: Orthopedics;  Laterality: Right;   TOTAL KNEE ARTHROPLASTY Left 10/07/2022    Procedure: TOTAL KNEE ARTHROPLASTY;  Surgeon: Ollen GrossAluisio, Frank, MD;  Location: [facility];  Service: Orthopedics;  Laterality: Left;   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 with body mass index (BMI) of 37.0 to 37.9 in adult 02/11/2022   Weight loss observed on examination 02/11/2022   Pre-operative clearance 01/17/2022   Primary osteoarthritis of right knee 01/17/2022   Carpal tunnel syndrome, right upper limb 11/15/2021   Numbness 11/02/2021   OSA (obstructive sleep apnea) 07/24/2020   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/13/2020   Upper airway cough syndrome 06/13/2020   Class 3 severe obesity due to excess calories with serious comorbidity and body mass index (BMI) of 45.0 to 49.9 in adult 06/13/2020   MCI (mild cognitive impairment) 06/13/2020   Insulin resistance 05/09/2020   Insomnia 05/09/2020   Vitamin D deficiency 02/17/2019   Diarrhea 02/17/2019   Essential hypertension 02/17/2019   Class 3 severe obesity due to excess calories with serious comorbidity and body mass index (BMI) of 40.0 to 44.9 in adult  02/17/2019   Hair loss 02/17/2019   Anxiety 11/30/2017   Primary osteoarthritis of both knees 11/30/2017   Abnormal WBC count 11/30/2017   Obesity (BMI 30-39.9) 07/23/2013   Suspicious mole 02/01/2013   Palpitations 07/23/2012   Hyperlipidemia 01/28/2012   Morbid obesity (HCC) 04/24/2011   Preventative health care 02/20/2011   Skin lump of leg 10/10/2010   SORE THROAT 08/15/2010   LYMPHADENOPATHY 08/15/2010   TOBACCO USE 01/17/2010   Hyperlipidemia associated with type 2 diabetes mellitus 04/05/2008   TINNITUS, CHRONIC, 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   ALLERGIC RHINITIS 01/25/2008   LIVER FUNCTION TESTS, ABNORMAL 01/25/2008   Hypothyroidism 01/13/2007   Depression with anxiety  01/13/2007   TRANSIENT GLOBAL AMNESIA 01/13/2007   FOOT SURGERY, HX OF 01/13/2007    PCP: Ria Clock  REFERRING PROVIDER: Weston Brass  REFERRING DIAG: s/p L TKA  THERAPY DIAG:  Acute pain of left knee  Stiffness of left knee, not elsewhere classified  Difficulty in walking, not elsewhere classified  Localized edema  Muscle weakness (generalized)  Rationale for Evaluation and Treatment: Rehabilitation  ONSET DATE: 10/07/22  SUBJECTIVE:   SUBJECTIVE STATEMENT: "Im just deadly tired" Can't sleep at night not sure why  PERTINENT HISTORY: R TKA 04/01/22 L TKA 10/07/22  PAIN:  Are you having pain? Yes: NPRS scale: 1/10 Pain location: L knee and in the thigh Pain description: stabbing, throbbing Aggravating factors: moving it mostly the bending, walking Relieving factors: ice, pain meds  PRECAUTIONS: None  WEIGHT BEARING RESTRICTIONS: No  FALLS:  Has patient fallen in last 6 months? No  LIVING ENVIRONMENT: Lives with: lives with their spouse Lives in: House/apartment Stairs: Yes: Internal: 15 steps; on right going up and External: 3 steps; grab bars Has following equipment at home: Single point cane and Walker - 2 wheeled  PLOF: Independent  PATIENT GOALS: to get back to normal and walk with normal gait and no pain  NEXT MD VISIT: 10/17/22  OBJECTIVE:   COGNITION: Overall cognitive status: Within functional limits for tasks assessed     SENSATION: WFL   MUSCLE LENGTH: Hamstrings: limited on L side   PALPATION: Warm and TTP around L knee  LOWER EXTREMITY ROM:  Active ROM Left eval L 10/24/22  Hip flexion    Hip extension    Hip abduction    Hip adduction    Hip internal rotation    Hip external rotation    Knee flexion    Knee extension -14 4  Ankle dorsiflexion 80 94  Ankle plantarflexion    Ankle inversion    Ankle eversion     (Blank rows = not tested)  LOWER EXTREMITY MMT:  MMT Right eval Left eval  Hip flexion  2+   Hip extension    Hip abduction    Hip adduction    Hip internal rotation    Hip external rotation    Knee flexion  2+  Knee extension  2+  Ankle dorsiflexion    Ankle plantarflexion    Ankle inversion    Ankle eversion     (Blank rows = not tested)   FUNCTIONAL TESTS:  5 times sit to stand: 20.41s with UE push off and pain Timed up and go (TUG): 39.81s with RW   GAIT: Distance walked: from lobby to room Assistive device utilized: Environmental consultant - 2 wheeled Level of assistance: Modified independence Comments: antalgic gait, decrease Wbing on left side, decrease stance time and step length on LLE   TODAY'S TREATMENT:  DATE:  10/24/22 NuStep L3 x 6 min LE only Resisted gait 20lb all directions x3  L knee PROM  Leg press 20lb 2x10 LLE LAQ 2lb 2x10 Vaso to L knee 10 mins med pressure 37F  10/21/21 NuStep L4 x 6 min  L knee PROM flex and Ext S2S 2x10 Hamstring curls green LLE 2x15 LAQ LLE 2lb 2x10 Stair training 2 rails alternating pattern 4 & 6in Vaso to L knee 10 mins med pressure 37F   10/17/22 NuStep L4 x 6 min  L knee PROM flex and Ext LLE HS curls red 2x15 L knee LAQ 2lb 2x15 Gait 28ft 2 laps with SPC S2S slightly elevated surface Standing march w/ SPC Vaso to L knee 10 mins med pressure 37F   10/15/22 NuStep L3 x 6 min  L knee PROM flex and Ext L knee LAQ 2x10 LLE HS curls red 2x10 Quad sets x10 LLE  S2S x5 elevated surface  Standing march x5 HHA x5 Vaso to L knee 10 mins med pressure 37F   10/10/22- EVAL & Vaso to L knee 10 mins med pressure 37F    PATIENT EDUCATION:  Education details: POC and HEP Person educated: Patient Education method: Explanation Education comprehension: verbalized understanding  HOME EXERCISE PROGRAM: Access Code: [email or web address] URL: [email or web address] Date: 10/10/2022 Prepared by: Cassie Freer  Exercises - Mini Squat with Counter Support  - 1 x daily - 7 x weekly - 2 sets - 10 reps - Seated Heel Slide  - 1 x daily - 7 x weekly - 2 sets - 10 reps - Heel Raises with Counter Support  - 1 x daily - 7 x weekly - 2 sets - 10 reps - Supine Quad Set  - 1 x daily - 7 x weekly - 2 sets - 10 reps  ASSESSMENT:  CLINICAL IMPRESSION: Patient is a 77 y.o. female who was seen today for physical therapy  treatment s/p L TKA. Her surgery date was 10/07/22. Pt enters doing well ambulating with SPC.  Pt stated PA was pleased with progress. She has progressed increasing her L knee AROM in both directions. Pt ambulated during session without SPC LOB x 1. .Pain reported with passive L knee flexion.   OBJECTIVE IMPAIRMENTS: Abnormal gait, decreased mobility, difficulty walking, decreased ROM, decreased strength, and pain.   ACTIVITY LIMITATIONS: standing, squatting, stairs, transfers, and locomotion level  PARTICIPATION LIMITATIONS: driving, shopping, and community activity  REHAB POTENTIAL: Good  CLINICAL DECISION MAKING: Stable/uncomplicated  EVALUATION COMPLEXITY: Low   GOALS: Goals reviewed with patient? Yes  SHORT TERM GOALS: Target date: 11/21/22  Patient will be independent with initial HEP. Goal status: Progressing  2.  Patient will demonstrate 5xSTS <14s without UE push off Baseline: 20.41s Goal status: INITIAL  3.  Patient will decrease TUG < 20s Baseline: 39.81s Goal status: INITIAL   LONG TERM GOALS: Target date: 01/02/23  Patient will be independent with advanced/ongoing HEP to improve outcomes and carryover.  Goal status: INITIAL  2.  Patient will report at least 75% improvement in L knee pain to improve QOL. Baseline: 8/10 Goal status: INITIAL  3.  Patient will demonstrate improved L knee AROM to >/= 0-120 deg to allow for normal gait and stair mechanics. Baseline: 14-0-120 Goal status: INITIAL  4.  Patient will demonstrate improved functional LLE strength  as demonstrated by 5/5. Baseline: 2+ Goal status: INITIAL  5.  Patient will be able to ambulate 600' with LRAD and normal gait pattern without increased pain to access  community.  Baseline: RW antalgic gait Goal status: INITIAL  6. Patient will decrease TUG to <12, no assistive device Baseline: 39.81s Goal status: INITIAL    PLAN:  PT FREQUENCY: 2x/week  PT DURATION: 12 weeks  PLANNED INTERVENTIONS: Therapeutic exercises, Therapeutic activity, Neuromuscular re-education, Balance training, Gait training, Patient/Family education, Self Care, Joint mobilization, Stair training, Dry Needling, Electrical stimulation, Cryotherapy, Moist heat, Manual lymph drainage, scar mobilization, Vasopneumatic device, Ultrasound, Ionotophoresis 4mg /ml Dexamethasone, and Manual therapy  PLAN FOR NEXT SESSION: PROM, AAROM, light strengthening, functional strengthening   Grayce Sessions, PTA 10/24/2022, 12:58 PM

## 2022-10-29 ENCOUNTER — Ambulatory Visit: Payer: 59

## 2022-10-29 DIAGNOSIS — M25562 Pain in left knee: Secondary | ICD-10-CM | POA: Diagnosis not present

## 2022-10-29 DIAGNOSIS — R262 Difficulty in walking, not elsewhere classified: Secondary | ICD-10-CM

## 2022-10-29 DIAGNOSIS — R600 Localized edema: Secondary | ICD-10-CM

## 2022-10-29 DIAGNOSIS — M6281 Muscle weakness (generalized): Secondary | ICD-10-CM

## 2022-10-29 DIAGNOSIS — M25662 Stiffness of left knee, not elsewhere classified: Secondary | ICD-10-CM

## 2022-10-29 NOTE — Therapy (Signed)
OUTPATIENT PHYSICAL THERAPY LOWER EXTREMITY TREATMENT   Patient Name: [name] [name] MRN: [id number] DOB:03-Aug-1945, 77 y.o., female Today's Date: 10/29/2022  END OF SESSION:  PT End of Session - 10/29/22 1318     Visit Number 6    Date for PT Re-Evaluation 01/02/23    PT Start Time 1315    PT Stop Time 1410    PT Time Calculation (min) 55 min    Activity Tolerance Patient tolerated treatment well    Behavior During Therapy [facility] for tasks assessed/performed             Past Medical History:  Diagnosis Date   Allergic rhinitis    Anxiety    Arthritis    Cataract    Depression    Diabetes mellitus without complication    borderline   Emphysema lung    Early stage   Endometrial polyp    Heart murmur    as a child   Hyperlipidemia    Hyperlipidemia    on simvastatin   Hypertension    Hypothyroidism    IBS (irritable bowel syndrome)    Obesity    Osteoarthritis    Seasonal allergies    Sleep apnea    no cpap   Vertigo 09/2022   Past Surgical History:  Procedure Laterality Date   BUNIONECTOMY  2009   Dr.Duda, left   CARPAL TUNNEL RELEASE Right 11/28/2021   Procedure: RIGHT CARPAL TUNNEL RELEASE;  Surgeon: Marlyne Beards, MD;  Location: [facility];  Service: Orthopedics;  Laterality: Right;   EYE SURGERY     HYSTEROSCOPY WITH D & C  04/24/2011   Procedure: DILATATION AND CURETTAGE (D&C) /HYSTEROSCOPY;  Surgeon: Hal Morales, MD;  Location: [facility];  Service: Gynecology;  Laterality: N/A;   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   Dr Harriet Pho FASCIA SURGERY  1994   Dr Eulah Pont   POLYPECTOMY  04/24/2011   Procedure: POLYPECTOMY;  Surgeon: Hal Morales, MD;  Location: [facility];  Service: Gynecology;  Laterality: N/A;   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   Procedure: TOTAL KNEE ARTHROPLASTY;  Surgeon: Ollen Gross, MD;  Location: [facility];  Service: Orthopedics;  Laterality: Right;   TOTAL KNEE ARTHROPLASTY Left 10/07/2022    Procedure: TOTAL KNEE ARTHROPLASTY;  Surgeon: Ollen Gross, MD;  Location: [facility];  Service: Orthopedics;  Laterality: Left;   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 with body mass index (BMI) of 37.0 to 37.9 in adult 02/11/2022   Weight loss observed on examination 02/11/2022   Pre-operative clearance 01/17/2022   Primary osteoarthritis of right knee 01/17/2022   Carpal tunnel syndrome, right upper limb 11/15/2021   Numbness 11/02/2021   OSA (obstructive sleep apnea) 07/24/2020   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/13/2020   Upper airway cough syndrome 06/13/2020   Class 3 severe obesity due to excess calories with serious comorbidity and body mass index (BMI) of 45.0 to 49.9 in adult 06/13/2020   MCI (mild cognitive impairment) 06/13/2020   Insulin resistance 05/09/2020   Insomnia 05/09/2020   Vitamin D deficiency 02/17/2019   Diarrhea 02/17/2019   Essential hypertension 02/17/2019   Class 3 severe obesity due to excess calories with serious comorbidity and body mass index (BMI) of 40.0 to 44.9 in adult  02/17/2019   Hair loss 02/17/2019   Anxiety 11/30/2017   Primary osteoarthritis of both knees 11/30/2017   Abnormal WBC count 11/30/2017   Obesity (BMI 30-39.9) 07/23/2013   Suspicious mole 02/01/2013   Palpitations 07/23/2012   Hyperlipidemia 01/28/2012   Morbid obesity (HCC) 04/24/2011   Preventative health care 02/20/2011   Skin lump of leg 10/10/2010   SORE THROAT 08/15/2010   LYMPHADENOPATHY 08/15/2010   TOBACCO USE 01/17/2010   Hyperlipidemia associated with type 2 diabetes mellitus 04/05/2008   TINNITUS, CHRONIC, 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   ALLERGIC RHINITIS 01/25/2008   LIVER FUNCTION TESTS, ABNORMAL 01/25/2008   Hypothyroidism 01/13/2007   Depression with anxiety  01/13/2007   TRANSIENT GLOBAL AMNESIA 01/13/2007   FOOT SURGERY, HX OF 01/13/2007    PCP: Ria Clock  REFERRING PROVIDER: Weston Brass  REFERRING DIAG: s/p L TKA  THERAPY DIAG:  Acute pain of left knee  Stiffness of left knee, not elsewhere classified  Difficulty in walking, not elsewhere classified  Localized edema  Muscle weakness (generalized)  Rationale for Evaluation and Treatment: Rehabilitation  ONSET DATE: 10/07/22  SUBJECTIVE:   SUBJECTIVE STATEMENT: Went to post-op visit and the told me I am 5 weeks ahead. Been walking without cane for a week and a half now.   PERTINENT HISTORY: R TKA 04/01/22 L TKA 10/07/22  PAIN:  Are you having pain? Yes: NPRS scale: 1/10 Pain location: L knee and in the thigh Pain description: stabbing, throbbing Aggravating factors: moving it mostly the bending, walking Relieving factors: ice, pain meds  PRECAUTIONS: None  WEIGHT BEARING RESTRICTIONS: No  FALLS:  Has patient fallen in last 6 months? No  LIVING ENVIRONMENT: Lives with: lives with their spouse Lives in: House/apartment Stairs: Yes: Internal: 15 steps; on right going up and External: 3 steps; grab bars Has following equipment at home: Single point cane and Walker - 2 wheeled  PLOF: Independent  PATIENT GOALS: to get back to normal and walk with normal gait and no pain  NEXT MD VISIT: 10/17/22  OBJECTIVE:   COGNITION: Overall cognitive status: Within functional limits for tasks assessed     SENSATION: WFL   MUSCLE LENGTH: Hamstrings: limited on L side   PALPATION: Warm and TTP around L knee  LOWER EXTREMITY ROM:  Active ROM Left eval L 10/24/22 L 10/29/22  Hip flexion     Hip extension     Hip abduction     Hip adduction     Hip internal rotation     Hip external rotation     Knee flexion     Knee extension -14 4 2   Ankle dorsiflexion 80 94 96  Ankle plantarflexion     Ankle inversion     Ankle eversion      (Blank rows = not  tested)  LOWER EXTREMITY MMT:  MMT Right eval Left eval  Hip flexion  2+  Hip extension    Hip abduction    Hip adduction    Hip internal rotation    Hip external rotation    Knee flexion  2+  Knee extension  2+  Ankle dorsiflexion    Ankle plantarflexion    Ankle inversion    Ankle eversion     (Blank rows = not tested)   FUNCTIONAL TESTS:  5 times sit to stand: 20.41s with UE push off and pain Timed up and go (TUG): 39.81s with RW   GAIT: Distance walked: from lobby to room Assistive device utilized: Environmental consultant - 2 wheeled Level  of assistance: Modified independence Comments: antalgic gait, decrease Wbing on left side, decrease stance time and step length on LLE   TODAY'S TREATMENT:                                                                                                                              DATE:  10/29/22 L knee PROM flex/ext with end range hold 5s x5  LAQ 5# 2x10 HS curls green 2x10 NuStep L4 x75mins Step ups 4" Leg press 20# 2x10, L knee 2x5 STS on airex 2x10   Calf raises 2x10  Vaso to L knee 10 mins    10/24/22 NuStep L3 x 6 min LE only Resisted gait 20lb all directions x3  L knee PROM  Leg press 20lb 2x10 LLE LAQ 2lb 2x10 Vaso to L knee 10 mins med pressure 13F  10/21/21 NuStep L4 x 6 min  L knee PROM flex and Ext S2S 2x10 Hamstring curls green LLE 2x15 LAQ LLE 2lb 2x10 Stair training 2 rails alternating pattern 4 & 6in Vaso to L knee 10 mins med pressure 13F   10/17/22 NuStep L4 x 6 min  L knee PROM flex and Ext LLE HS curls red 2x15 L knee LAQ 2lb 2x15 Gait 227ft 2 laps with SPC S2S slightly elevated surface Standing march w/ SPC Vaso to L knee 10 mins med pressure 13F   10/15/22 NuStep L3 x 6 min  L knee PROM flex and Ext L knee LAQ 2x10 LLE HS curls red 2x10 Quad sets x10 LLE  S2S x5 elevated surface  Standing march x5 HHA x5 Vaso to L knee 10 mins med pressure 13F   10/10/22- EVAL & Vaso to L knee 10 mins med  pressure 13F    PATIENT EDUCATION:  Education details: POC and HEP Person educated: Patient Education method: Explanation Education comprehension: verbalized understanding  HOME EXERCISE PROGRAM: Access Code: [email or web address] URL: [email or web address] Date: 10/10/2022 Prepared by: Cassie Freer  Exercises - Mini Squat with Counter Support  - 1 x daily - 7 x weekly - 2 sets - 10 reps - Seated Heel Slide  - 1 x daily - 7 x weekly - 2 sets - 10 reps - Heel Raises with Counter Support  - 1 x daily - 7 x weekly - 2 sets - 10 reps - Supine Quad Set  - 1 x daily - 7 x weekly - 2 sets - 10 reps  ASSESSMENT:  CLINICAL IMPRESSION: Patient enters doing well ambulating without SPC but still antalgic. Pt stated PA was pleased with progress and reported she is 5 weeks ahead of time. She has progressed increasing her L knee AROM in both directions. Also progressed with more weights today.   OBJECTIVE IMPAIRMENTS: Abnormal gait, decreased mobility, difficulty walking, decreased ROM, decreased strength, and pain.   ACTIVITY LIMITATIONS: standing, squatting, stairs, transfers, and locomotion level  PARTICIPATION LIMITATIONS: driving, shopping, and community activity  REHAB POTENTIAL: Good  CLINICAL DECISION  MAKING: Stable/uncomplicated  EVALUATION COMPLEXITY: Low   GOALS: Goals reviewed with patient? Yes  SHORT TERM GOALS: Target date: 11/21/22  Patient will be independent with initial HEP. Goal status: Progressing  2.  Patient will demonstrate 5xSTS <14s without UE push off Baseline: 20.41s Goal status: INITIAL  3.  Patient will decrease TUG < 20s Baseline: 39.81s Goal status: INITIAL   LONG TERM GOALS: Target date: 01/02/23  Patient will be independent with advanced/ongoing HEP to improve outcomes and carryover.  Goal status: INITIAL  2.  Patient will report at least 75% improvement in L knee pain to improve QOL. Baseline: 8/10 Goal status: INITIAL  3.  Patient will  demonstrate improved L knee AROM to >/= 0-120 deg to allow for normal gait and stair mechanics. Baseline: 14-0-120 Goal status: INITIAL  4.  Patient will demonstrate improved functional LLE strength as demonstrated by 5/5. Baseline: 2+ Goal status: INITIAL  5.  Patient will be able to ambulate 600' with LRAD and normal gait pattern without increased pain to access community.  Baseline: RW antalgic gait Goal status: INITIAL  6. Patient will decrease TUG to <12, no assistive device Baseline: 39.81s Goal status: INITIAL    PLAN:  PT FREQUENCY: 2x/week  PT DURATION: 12 weeks  PLANNED INTERVENTIONS: Therapeutic exercises, Therapeutic activity, Neuromuscular re-education, Balance training, Gait training, Patient/Family education, Self Care, Joint mobilization, Stair training, Dry Needling, Electrical stimulation, Cryotherapy, Moist heat, Manual lymph drainage, scar mobilization, Vasopneumatic device, Ultrasound, Ionotophoresis /ml Dexamethasone, and Manual therapy  PLAN FOR NEXT SESSION: PROM, AAROM, light strengthening, functional strengthening   Cassie Freer, PT 10/29/2022, 2:03 PM

## 2022-10-31 ENCOUNTER — Ambulatory Visit: Payer: 59 | Admitting: Physical Therapy

## 2022-11-05 ENCOUNTER — Ambulatory Visit: Payer: 59 | Admitting: Physical Therapy

## 2022-11-07 ENCOUNTER — Ambulatory Visit: Payer: 59

## 2022-12-03 ENCOUNTER — Encounter: Payer: Self-pay | Admitting: Family Medicine

## 2022-12-03 ENCOUNTER — Other Ambulatory Visit: Payer: Self-pay | Admitting: Family Medicine

## 2022-12-03 DIAGNOSIS — E039 Hypothyroidism, unspecified: Secondary | ICD-10-CM

## 2022-12-03 DIAGNOSIS — R7303 Prediabetes: Secondary | ICD-10-CM

## 2022-12-03 MED ORDER — METFORMIN HCL 500 MG PO TABS: 500.0000 mg | ORAL_TABLET | Freq: Every day | ORAL | 1 refills | Status: DC

## 2022-12-17 ENCOUNTER — Other Ambulatory Visit (HOSPITAL_BASED_OUTPATIENT_CLINIC_OR_DEPARTMENT_OTHER): Payer: Self-pay | Admitting: Family Medicine

## 2022-12-17 DIAGNOSIS — Z1231 Encounter for screening mammogram for malignant neoplasm of breast: Secondary | ICD-10-CM

## 2023-01-10 ENCOUNTER — Other Ambulatory Visit: Payer: Self-pay | Admitting: Family Medicine

## 2023-01-10 DIAGNOSIS — E559 Vitamin D deficiency, unspecified: Secondary | ICD-10-CM

## 2023-02-12 ENCOUNTER — Encounter (INDEPENDENT_AMBULATORY_CARE_PROVIDER_SITE_OTHER): Payer: Self-pay

## 2023-02-20 ENCOUNTER — Other Ambulatory Visit: Payer: Self-pay | Admitting: Family Medicine

## 2023-02-20 DIAGNOSIS — E559 Vitamin D deficiency, unspecified: Secondary | ICD-10-CM

## 2023-02-27 ENCOUNTER — Ambulatory Visit (HOSPITAL_BASED_OUTPATIENT_CLINIC_OR_DEPARTMENT_OTHER)
Admission: RE | Admit: 2023-02-27 | Discharge: 2023-02-27 | Disposition: A | Discharge status: Home / Self Care | Payer: 59 | Source: Ambulatory Visit | Attending: Family Medicine | Admitting: Family Medicine

## 2023-02-27 ENCOUNTER — Inpatient Hospital Stay (HOSPITAL_BASED_OUTPATIENT_CLINIC_OR_DEPARTMENT_OTHER): Admission: RE | Admit: 2023-02-27 | Payer: 59 | Source: Ambulatory Visit

## 2023-02-27 ENCOUNTER — Telehealth: Payer: Self-pay | Admitting: Family Medicine

## 2023-02-27 DIAGNOSIS — Z122 Encounter for screening for malignant neoplasm of respiratory organs: Secondary | ICD-10-CM | POA: Diagnosis present

## 2023-02-27 DIAGNOSIS — Z87891 Personal history of nicotine dependence: Secondary | ICD-10-CM | POA: Diagnosis present

## 2023-02-27 NOTE — Telephone Encounter (Signed)
Darel Hong with [facility] calling to find out if provider is okay with them changing the manufacturer of the Levothyroxine medication. Please call them back at [phone] to advise. Use reference # [id number].

## 2023-02-28 NOTE — Telephone Encounter (Signed)
Returned call. Okay to switch

## 2023-03-03 ENCOUNTER — Encounter: Payer: Self-pay | Admitting: Family Medicine

## 2023-03-03 ENCOUNTER — Ambulatory Visit (INDEPENDENT_AMBULATORY_CARE_PROVIDER_SITE_OTHER): Payer: 59 | Admitting: Family Medicine

## 2023-03-03 VITALS — BP 124/80 | HR 60 | Temp 97.8°F | Resp 18 | Ht 60.2 in | Wt 196.2 lb

## 2023-03-03 DIAGNOSIS — E039 Hypothyroidism, unspecified: Secondary | ICD-10-CM | POA: Diagnosis not present

## 2023-03-03 DIAGNOSIS — I10 Essential (primary) hypertension: Secondary | ICD-10-CM | POA: Diagnosis not present

## 2023-03-03 DIAGNOSIS — E1165 Type 2 diabetes mellitus with hyperglycemia: Secondary | ICD-10-CM | POA: Diagnosis not present

## 2023-03-03 DIAGNOSIS — E559 Vitamin D deficiency, unspecified: Secondary | ICD-10-CM

## 2023-03-03 DIAGNOSIS — Z23 Encounter for immunization: Secondary | ICD-10-CM | POA: Diagnosis not present

## 2023-03-03 DIAGNOSIS — R7303 Prediabetes: Secondary | ICD-10-CM

## 2023-03-03 DIAGNOSIS — Z0000 Encounter for general adult medical examination without abnormal findings: Secondary | ICD-10-CM

## 2023-03-03 DIAGNOSIS — E1169 Type 2 diabetes mellitus with other specified complication: Secondary | ICD-10-CM

## 2023-03-03 DIAGNOSIS — E785 Hyperlipidemia, unspecified: Secondary | ICD-10-CM

## 2023-03-03 NOTE — Assessment & Plan Note (Signed)
Well controlled, no changes to meds. Encouraged heart healthy diet such as the DASH diet and exercise as tolerated.  °

## 2023-03-03 NOTE — Assessment & Plan Note (Signed)
Encourage heart healthy diet such as MIND or DASH diet, increase exercise, avoid trans fats, simple carbohydrates and processed foods, consider a krill or fish or flaxseed oil cap daily.  °

## 2023-03-03 NOTE — Assessment & Plan Note (Signed)
Ghm utd Check labs  See [facility] Health Maintenance  Topic Date Due   Pneumonia Vaccine 77+ Years old (3 of 3 - PPSV23 or PCV20) 02/01/2018   Zoster Vaccines- Shingrix (2 of 2) 06/11/2021   COVID-19 Vaccine (6 - 2023-24 season) 09/22/2022   Diabetic kidney evaluation - Urine ACR  01/18/2023   Lung Cancer Screening  02/26/2023   INFLUENZA VACCINE  02/13/2023   HEMOGLOBIN A1C  02/28/2023   OPHTHALMOLOGY EXAM  06/11/2023   FOOT EXAM  08/31/2023   Diabetic kidney evaluation - eGFR measurement  10/08/2023   MAMMOGRAM  03/26/2024   Colonoscopy  03/14/2026   DTaP/Tdap/Td (3 - Td or Tdap) 03/02/2033   DEXA SCAN  Completed   Hepatitis C Screening  Completed   HPV VACCINES  Aged Out

## 2023-03-03 NOTE — Patient Instructions (Addendum)
Flu Shingles Covid    Preventive Care 37 Years and Older, Female Preventive care refers to lifestyle choices and visits with your health care provider that can promote health and wellness. Preventive care visits are also called wellness exams. What can I expect for my preventive care visit? Counseling Your health care provider may ask you questions about your: Medical history, including: Past medical problems. Family medical history. Pregnancy and menstrual history. History of falls. Current health, including: Memory and ability to understand (cognition). Emotional well-being. Home life and relationship well-being. Sexual activity and sexual health. Lifestyle, including: Alcohol, nicotine or tobacco, and drug use. Access to firearms. Diet, exercise, and sleep habits. Work and work Astronomer. Sunscreen use. Safety issues such as seatbelt and bike helmet use. Physical exam Your health care provider will check your: Height and weight. These may be used to calculate your BMI (body mass index). BMI is a measurement that tells if you are at a healthy weight. Waist circumference. This measures the distance around your waistline. This measurement also tells if you are at a healthy weight and may help predict your risk of certain diseases, such as type 2 diabetes and high blood pressure. Heart rate and blood pressure. Body temperature. Skin for abnormal spots. What immunizations do I need?  Vaccines are usually given at various ages, according to a schedule. Your health care provider will recommend vaccines for you based on your age, medical history, and lifestyle or other factors, such as travel or where you work. What tests do I need? Screening Your health care provider may recommend screening tests for certain conditions. This may include: Lipid and cholesterol levels. Hepatitis C test. Hepatitis B test. HIV (human immunodeficiency virus) test. STI (sexually transmitted  infection) testing, if you are at risk. Lung cancer screening. Colorectal cancer screening. Diabetes screening. This is done by checking your blood sugar (glucose) after you have not eaten for a while (fasting). Mammogram. Talk with your health care provider about how often you should have regular mammograms. BRCA-related cancer screening. This may be done if you have a family history of breast, ovarian, tubal, or peritoneal cancers. Bone density scan. This is done to screen for osteoporosis. Talk with your health care provider about your test results, treatment options, and if necessary, the need for more tests. Follow these instructions at home: Eating and drinking  Eat a diet that includes fresh fruits and vegetables, whole grains, lean protein, and low-fat dairy products. Limit your intake of foods with high amounts of sugar, saturated fats, and salt. Take vitamin and mineral supplements as recommended by your health care provider. Do not drink alcohol if your health care provider tells you not to drink. If you drink alcohol: Limit how much you have to 0-1 drink a day. Know how much alcohol is in your drink. In the [place] one drink equals one 12 oz bottle of beer (355 mL), one 5 oz glass of wine (148 mL), or one 1 oz glass of hard liquor (44 mL). Lifestyle Brush your teeth every morning and night with fluoride toothpaste. Floss one time each day. Exercise for at least 30 minutes 5 or more days each week. Do not use any products that contain nicotine or tobacco. These products include cigarettes, chewing tobacco, and vaping devices, such as e-cigarettes. If you need help quitting, ask your health care provider. Do not use drugs. If you are sexually active, practice safe sex. Use a condom or other form of protection in order to prevent STIs.  Take aspirin only as told by your health care provider. Make sure that you understand how much to take and what form to take. Work with your health care  provider to find out whether it is safe and beneficial for you to take aspirin daily. Ask your health care provider if you need to take a cholesterol-lowering medicine (statin). Find healthy ways to manage stress, such as: Meditation, yoga, or listening to music. Journaling. Talking to a trusted person. Spending time with friends and family. Minimize exposure to UV radiation to reduce your risk of skin cancer. Safety Always wear your seat belt while driving or riding in a vehicle. Do not drive: If you have been drinking alcohol. Do not ride with someone who has been drinking. When you are tired or distracted. While texting. If you have been using any mind-altering substances or drugs. Wear a helmet and other protective equipment during sports activities. If you have firearms in your house, make sure you follow all gun safety procedures. What's next? Visit your health care provider once a year for an annual wellness visit. Ask your health care provider how often you should have your eyes and teeth checked. Stay up to date on all vaccines. This information is not intended to replace advice given to you by your health care provider. Make sure you discuss any questions you have with your health care provider. Document Revised: 12/27/2020 Document Reviewed: 12/27/2020 Elsevier Patient Education  2024 [employer].

## 2023-03-03 NOTE — Progress Notes (Signed)
Established Patient Office Visit  Subjective   Patient ID: [name] [name], female    DOB: March 25, 1946  Age: 77 y.o. MRN: [id number]  Chief Complaint  Patient presents with   Annual Exam    Pt states fasting     HPI Discussed the use of AI scribe software for clinical note transcription with the patient, who gave verbal consent to proceed.  History of Present Illness   The patient, a 77 year old with a history of knee problems and thyroid issues, presents for a routine check-up and lab work. She reports that her knees are doing well post-surgery and she has no current complaints related to them. She is on levothyroxine for her thyroid issues and reports no current problems related to her thyroid. She is also on simvastatin and Zoloft, and she has no complaints related to these medications. The patient mentions a medication that [facility] has been unable to refill, but she is unsure of which medication it is. She also mentions having a CT scan done recently but the results are not yet available. The patient is also dealing with the recent loss of her husband, which may be impacting her overall well-being.      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 with body mass index (BMI) of 37.0 to 37.9 in adult 02/11/2022   Weight loss observed on examination 02/11/2022   Pre-operative clearance 01/17/2022   Primary osteoarthritis of right knee 01/17/2022   Carpal tunnel syndrome, right upper limb 11/15/2021   Numbness 11/02/2021   OSA (obstructive sleep apnea) 07/24/2020   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/13/2020   Upper airway cough syndrome 06/13/2020   Class 3 severe obesity due to excess calories with serious comorbidity and body mass index (BMI) of 45.0 to 49.9 in adult (HCC) 06/13/2020    MCI (mild cognitive impairment) 06/13/2020   Insulin resistance 05/09/2020   Insomnia 05/09/2020   Vitamin D deficiency 02/17/2019   Diarrhea 02/17/2019   Essential hypertension 02/17/2019   Class 3 severe obesity due to excess calories with serious comorbidity and body mass index (BMI) of 40.0 to 44.9 in adult (HCC) 02/17/2019   Hair loss 02/17/2019   Anxiety 11/30/2017   Primary osteoarthritis of both knees 11/30/2017   Abnormal WBC count 11/30/2017   Obesity (BMI 30-39.9) 07/23/2013   Suspicious mole 02/01/2013   Palpitations 07/23/2012   Hyperlipidemia 01/28/2012   Morbid obesity (HCC) 04/24/2011   Preventative health care 02/20/2011   Skin lump of leg 10/10/2010   SORE THROAT 08/15/2010   LYMPHADENOPATHY 08/15/2010   TOBACCO USE 01/17/2010   Hyperlipidemia associated with type 2 diabetes mellitus (HCC) 04/05/2008   TINNITUS, CHRONIC, 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   ALLERGIC RHINITIS 01/25/2008   LIVER FUNCTION TESTS, ABNORMAL 01/25/2008   Hypothyroidism 01/13/2007   Depression with anxiety 01/13/2007   TRANSIENT GLOBAL AMNESIA 01/13/2007   FOOT SURGERY, HX OF 01/13/2007   Past Medical History:  Diagnosis Date   Allergic rhinitis    Anxiety    Arthritis    Cataract    Depression    Diabetes mellitus without complication (HCC)    borderline   Emphysema lung (HCC)    Early stage   Endometrial polyp    Heart murmur    as a child   Hyperlipidemia    Hyperlipidemia  on simvastatin   Hypertension    Hypothyroidism    IBS (irritable bowel syndrome)    Obesity    Osteoarthritis    Seasonal allergies    Sleep apnea    no cpap   Vertigo 09/2022   Past Surgical History:  Procedure Laterality Date   BUNIONECTOMY  2009   Dr.Duda, left   CARPAL TUNNEL RELEASE Right 11/28/2021   Procedure: RIGHT CARPAL TUNNEL RELEASE;  Surgeon: Marlyne Beards, MD;  Location: [facility];  Service: Orthopedics;  Laterality: Right;   EYE SURGERY     HYSTEROSCOPY WITH D & C   04/24/2011   Procedure: DILATATION AND CURETTAGE (D&C) /HYSTEROSCOPY;  Surgeon: Hal Morales, MD;  Location: [facility];  Service: Gynecology;  Laterality: N/A;   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   Dr Harriet Pho FASCIA SURGERY  1994   Dr Eulah Pont   POLYPECTOMY  04/24/2011   Procedure: POLYPECTOMY;  Surgeon: Hal Morales, MD;  Location: [facility];  Service: Gynecology;  Laterality: N/A;   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   Procedure: TOTAL KNEE ARTHROPLASTY;  Surgeon: Ollen Gross, MD;  Location: [facility];  Service: Orthopedics;  Laterality: Right;   TOTAL KNEE ARTHROPLASTY Left 10/07/2022   Procedure: TOTAL KNEE ARTHROPLASTY;  Surgeon: Ollen Gross, MD;  Location: [facility];  Service: Orthopedics;  Laterality: Left;   Social History   Tobacco Use   Smoking status: Former    Current packs/day: 0.00    Average packs/day: 2.0 packs/day for 42.0 years (84.0 ttl pk-yrs)    Types: Cigarettes    Start date: 07/28/1970    Quit date: 07/28/2012    Years since quitting: 10.6   Smokeless tobacco: Never  Vaping Use   Vaping status: Former  Substance Use Topics   Alcohol use: Yes    Comment: rare  2-3 x a year   Drug use: Never   Social History   Socioeconomic History   Marital status: Widowed    Spouse name: Iyanla Beames   Number of children: 0   Years of education: Not on file   Highest education level: Master's degree (e.g., MA, MS, MEng, MEd, MSW, MBA)  Occupational History   Occupation: Daycare p/t    Comment: retired  Tobacco Use   Smoking status: Former    Current packs/day: 0.00    Average packs/day: 2.0 packs/day for 42.0 years (84.0 ttl pk-yrs)    Types: Cigarettes    Start date: 07/28/1970    Quit date: 07/28/2012    Years since quitting: 10.6   Smokeless tobacco: Never  Vaping Use   Vaping status: Former  Substance and Sexual Activity   Alcohol use: Yes    Comment: rare  2-3 x a year   Drug use: Never   Sexual activity: Yes    Partners: Male    Birth  control/protection: Post-menopausal  Other Topics Concern   Not on file  Social History Narrative   Regular exercise- walking and recumbent bike      Lives alone   Caffeine- 2 teas daily   Social Determinants of Health   Financial Resource Strain: Not on file  Food Insecurity: No Food Insecurity (10/07/2022)   Hunger Vital Sign    Worried About Running Out of Food in the Last Year: Never true    Ran Out of Food in the Last Year: Never true  Transportation Needs: No Transportation Needs (10/07/2022)   PRAPARE - Transportation    Lack  of Transportation (Medical): No    Lack of Transportation (Non-Medical): No  Physical Activity: Not on file  Stress: Not on file  Social Connections: Not on file  Intimate Partner Violence: Not At Risk (10/07/2022)   Humiliation, Afraid, Rape, and Kick questionnaire    Fear of Current or Ex-Partner: No    Emotionally Abused: No    Physically Abused: No    Sexually Abused: No   Family Status  Relation Name Status   Father  Deceased at age 103       colon ca   Mother  Deceased at age 49       pancreatic ca   PGM  Deceased   MGM  Deceased at age 2       MI   Sister  Alive   Brother  Alive   Sister  Alive   Mat Aunt  (Not Specified)   Youth worker  (Not Specified)   Sister  (Not Specified)  No partnership data on file   Family History  Problem Relation Age of Onset   Colon cancer Father 62   Cancer Father 48       colon   Alcoholism Father    Pancreatic cancer Mother 48   Cancer Mother 71       pancreatic   Colon cancer Paternal Grandmother    Cancer Paternal Grandmother 33       colon   Heart attack Maternal Grandmother 50   Heart disease Maternal Grandmother    Cancer Sister 58       melanoma   Breast cancer Sister    Cancer Brother 38       skin   Thyroid disease Sister    Breast cancer Maternal Aunt 30   Heart attack Maternal Aunt 60   Breast cancer Sister    Allergies  Allergen Reactions   Tape Other (See Comments)     Red, swollen, itching      Review of Systems  Constitutional:  Negative for chills, fever and malaise/fatigue.  HENT:  Negative for congestion and hearing loss.   Eyes:  Negative for blurred vision and discharge.  Respiratory:  Negative for cough, sputum production and shortness of breath.   Cardiovascular:  Negative for chest pain, palpitations and leg swelling.  Gastrointestinal:  Negative for abdominal pain, blood in stool, constipation, diarrhea, heartburn, nausea and vomiting.  Genitourinary:  Negative for dysuria, frequency, hematuria and urgency.  Musculoskeletal:  Negative for back pain, falls and myalgias.  Skin:  Negative for rash.  Neurological:  Negative for dizziness, sensory change, loss of consciousness, weakness and headaches.  Endo/Heme/Allergies:  Negative for environmental allergies. Does not bruise/bleed easily.  Psychiatric/Behavioral:  Negative for depression and suicidal ideas. The patient is not nervous/anxious and does not have insomnia.       Objective:     BP 124/80 (BP Location: Left Arm, Patient Position: Sitting, Cuff Size: Normal)   Pulse 60   Temp 97.8 F (36.6 C) (Oral)   Resp 18   Ht 5' 0.2" (1.529 m)   Wt 196 lb 3.2 oz (89 kg)   SpO2 99%   BMI 38.06 kg/m  BP Readings from Last 3 Encounters:  03/03/23 124/80  10/08/22 (!) 115/45  10/01/22 107/60   Wt Readings from Last 3 Encounters:  03/03/23 196 lb 3.2 oz (89 kg)  10/07/22 176 lb 5.9 oz (80 kg)  10/01/22 177 lb (80.3 kg)   SpO2 Readings from Last 3 Encounters:  03/03/23  99%  10/08/22 98%  10/01/22 98%      Physical Exam Vitals and nursing note reviewed.  Constitutional:      General: She is not in acute distress.    Appearance: Normal appearance. She is well-developed.  HENT:     Head: Normocephalic and atraumatic.     Right Ear: Tympanic membrane, ear canal and external ear normal. There is no impacted cerumen.     Left Ear: Tympanic membrane, ear canal and external ear  normal. There is no impacted cerumen.     Nose: Nose normal.     Mouth/Throat:     Mouth: Mucous membranes are moist.     Pharynx: Oropharynx is clear. No oropharyngeal exudate or posterior oropharyngeal erythema.  Eyes:     General: No scleral icterus.       Right eye: No discharge.        Left eye: No discharge.     Conjunctiva/sclera: Conjunctivae normal.     Pupils: Pupils are equal, round, and reactive to light.  Neck:     Thyroid: No thyromegaly or thyroid tenderness.     Vascular: No JVD.  Cardiovascular:     Rate and Rhythm: Normal rate and regular rhythm.     Heart sounds: Normal heart sounds. No murmur heard. Pulmonary:     Effort: Pulmonary effort is normal. No respiratory distress.     Breath sounds: Normal breath sounds.  Abdominal:     General: Bowel sounds are normal. There is no distension.     Palpations: Abdomen is soft. There is no mass.     Tenderness: There is no abdominal tenderness. There is no guarding or rebound.  Genitourinary:    Vagina: Normal.  Musculoskeletal:        General: Normal range of motion.     Cervical back: Normal range of motion and neck supple.     Right lower leg: No edema.     Left lower leg: No edema.  Lymphadenopathy:     Cervical: No cervical adenopathy.  Skin:    General: Skin is warm and dry.     Findings: No erythema or rash.  Neurological:     General: No focal deficit present.     Mental Status: She is alert and oriented to person, place, and time.     Cranial Nerves: No cranial nerve deficit.     Deep Tendon Reflexes: Reflexes are normal and symmetric.  Psychiatric:        Mood and Affect: Mood normal.        Behavior: Behavior normal.        Thought Content: Thought content normal.        Judgment: Judgment normal.      No results found for any visits on 03/03/23.  Last CBC Lab Results  Component Value Date   WBC 5.8 10/08/2022   HGB 10.4 (L) 10/08/2022   HCT 33.2 (L) 10/08/2022   MCV 93.0 10/08/2022    MCH 29.1 10/08/2022   RDW 14.6 10/08/2022   PLT 130 (L) 10/08/2022   Last metabolic panel Lab Results  Component Value Date   GLUCOSE 120 (H) 10/08/2022   NA 137 10/08/2022   K 3.9 10/08/2022   CL 105 10/08/2022   CO2 26 10/08/2022   BUN 17 10/08/2022   CREATININE 0.44 10/08/2022   GFRNONAA >60 10/08/2022   CALCIUM 8.4 (L) 10/08/2022   PROT 6.7 08/30/2022   ALBUMIN 4.2 01/17/2022   LABGLOB 2.1 05/26/2018  AGRATIO 2.1 05/26/2018   BILITOT 0.5 08/30/2022   ALKPHOS 72 01/17/2022   AST 19 08/30/2022   ALT 11 08/30/2022   ANIONGAP 6 10/08/2022   Last lipids Lab Results  Component Value Date   CHOL 189 08/30/2022   HDL 82 08/30/2022   LDLCALC 91 08/30/2022   LDLDIRECT 129.1 06/13/2011   TRIG 74 08/30/2022   CHOLHDL 2.3 08/30/2022   Last hemoglobin A1c Lab Results  Component Value Date   HGBA1C 5.8 (H) 08/30/2022   Last thyroid functions Lab Results  Component Value Date   TSH 1.30 08/30/2022   T3TOTAL 60 (L) 05/26/2018   T4TOTAL 10.3 11/02/2021   Last vitamin D Lab Results  Component Value Date   VD25OH 83 08/30/2022   Last vitamin B12 and Folate Lab Results  Component Value Date   VITAMINB12 >1500 (H) 02/09/2019      The 10-year ASCVD risk score (Arnett DK, et al., 2019) is: 24.4%    Assessment & Plan:   Problem List Items Addressed This Visit       Unprioritized   Vitamin D deficiency   Relevant Orders   VITAMIN D 25 Hydroxy (Vit-D Deficiency, Fractures)   Preventative health care - Primary   Hypothyroidism    Check labs       Relevant Orders   TSH   Hyperlipidemia associated with type 2 diabetes mellitus (HCC)    Encourage heart healthy diet such as MIND or DASH diet, increase exercise, avoid trans fats, simple carbohydrates and processed foods, consider a krill or fish or flaxseed oil cap daily.        Essential hypertension    Well controlled, no changes to meds. Encouraged heart healthy diet such as the DASH diet and exercise as  tolerated.        Relevant Orders   CBC with Differential/Platelet   Comprehensive metabolic panel   Lipid panel   TSH   VITAMIN D 25 Hydroxy (Vit-D Deficiency, Fractures)   Other Visit Diagnoses     Prediabetes       Type 2 diabetes mellitus with hyperglycemia, without long-term current use of insulin (HCC)       Relevant Orders   Comprehensive metabolic panel   Hemoglobin A1c   Need for tetanus booster       Relevant Orders   Tdap vaccine greater than or equal to 7yo IM (Completed)     Assessment and Plan    Immunizations Up-to-date on most immunizations. Unclear about the status of the pneumonia vaccine. Patient has received one dose of Shingrix and needs a second dose. Tetanus vaccine due. -Administer Tdap vaccine today. -Advise patient to get second dose of Shingrix at the pharmacy. -Clarify status of pneumonia vaccine and administer if needed.  Medication Refill Patient reported an issue with a medication refill from [facility]. Unclear which medication is involved. -Patient to identify the medication at home and send a message for a new prescription if needed.  General Health Maintenance Patient had a recent CT scan for lung cancer screening. Results are not yet available. -Review CT scan results when available.        Return in about 6 months (around 09/03/2023), or if symptoms worsen or fail to improve.    Donato Schultz, DO

## 2023-03-03 NOTE — Assessment & Plan Note (Signed)
Check labs 

## 2023-03-04 LAB — COMPREHENSIVE METABOLIC PANEL
ALT: 16 U/L (ref 0–35)
AST: 22 U/L (ref 0–37)
Albumin: 4.3 g/dL (ref 3.5–5.2)
Alkaline Phosphatase: 81 U/L (ref 39–117)
BUN: 24 mg/dL — ABNORMAL HIGH (ref 6–23)
CO2: 31 mEq/L (ref 19–32)
Calcium: 9.2 mg/dL (ref 8.4–10.5)
Chloride: 101 meq/L (ref 96–112)
Creatinine, Ser: 0.59 mg/dL (ref 0.40–1.20)
GFR: 88.57 mL/min (ref >60.00)
Glucose, Bld: 88 mg/dL (ref 70–99)
Potassium: 4.1 meq/L (ref 3.5–5.1)
Sodium: 140 meq/L (ref 135–145)
Total Bilirubin: 0.5 mg/dL (ref 0.2–1.2)
Total Protein: 6.6 g/dL (ref 6.0–8.3)

## 2023-03-04 LAB — CBC WITH DIFFERENTIAL/PLATELET
Basophils Absolute: 0 10*3/uL (ref 0.0–0.1)
Basophils Relative: 0.8 % (ref 0.0–3.0)
Eosinophils Absolute: 0.1 10*3/uL (ref 0.0–0.7)
Eosinophils Relative: 2 % (ref 0.0–5.0)
HCT: 41.4 % (ref 36.0–46.0)
Hemoglobin: 13.5 g/dL (ref 12.0–15.0)
Lymphocytes Relative: 21.8 % (ref 12.0–46.0)
Lymphs Abs: 0.9 10*3/uL (ref 0.7–4.0)
MCHC: 32.5 g/dL (ref 30.0–36.0)
MCV: 91 fl (ref 78.0–100.0)
Monocytes Absolute: 0.3 10*3/uL (ref 0.1–1.0)
Monocytes Relative: 7.9 % (ref 3.0–12.0)
Neutro Abs: 2.9 10*3/uL (ref 1.4–7.7)
Neutrophils Relative %: 67.5 % (ref 43.0–77.0)
Platelets: 199 10*3/uL (ref 150.0–400.0)
RBC: 4.55 Mil/uL (ref 3.87–5.11)
RDW: 16.5 % — ABNORMAL HIGH (ref 11.5–15.5)
WBC: 4.3 10*3/uL (ref 4.0–10.5)

## 2023-03-04 LAB — VITAMIN D 25 HYDROXY (VIT D DEFICIENCY, FRACTURES): VITD: 43.89 ng/mL (ref 30.00–100.00)

## 2023-03-04 LAB — LIPID PANEL
Cholesterol: 199 mg/dL (ref 0–200)
HDL: 71.8 mg/dL (ref >39.00)
LDL Cholesterol: 105 mg/dL — ABNORMAL HIGH (ref 0–99)
NonHDL: 127.14
Total CHOL/HDL Ratio: 3
Triglycerides: 111 mg/dL (ref 0.0–149.0)
VLDL: 22.2 mg/dL (ref 0.0–40.0)

## 2023-03-04 LAB — TSH: TSH: 3.22 u[IU]/mL (ref 0.35–5.50)

## 2023-03-04 LAB — HEMOGLOBIN A1C: Hgb A1c MFr Bld: 5.8 % (ref 4.6–6.5)

## 2023-03-07 ENCOUNTER — Other Ambulatory Visit: Payer: Self-pay

## 2023-03-07 ENCOUNTER — Telehealth: Payer: Self-pay | Admitting: Acute Care

## 2023-03-07 DIAGNOSIS — Z87891 Personal history of nicotine dependence: Secondary | ICD-10-CM

## 2023-03-07 DIAGNOSIS — Z122 Encounter for screening for malignant neoplasm of respiratory organs: Secondary | ICD-10-CM

## 2023-03-07 NOTE — Telephone Encounter (Signed)
Spoke with patient by phone to review results of recent LDCT. No suspicious findings for lung cancer.  Emphysema and Atherosclerosis, as previously noted.  New finding of aortic valve calcifications.  Results routed to PCP requesting any further recommendations for aortic valve calcifications be discussed with patient.  Patient acknowledged understanding.  Order placed for annual LDCT 2025.

## 2023-03-23 ENCOUNTER — Encounter: Payer: Self-pay | Admitting: Family Medicine

## 2023-03-23 DIAGNOSIS — F418 Other specified anxiety disorders: Secondary | ICD-10-CM

## 2023-03-24 ENCOUNTER — Telehealth: Payer: Self-pay | Admitting: Family Medicine

## 2023-03-24 MED ORDER — SERTRALINE HCL 100 MG PO TABS: 100.0000 mg | ORAL_TABLET | Freq: Every day | ORAL | 1 refills | Status: DC

## 2023-03-24 NOTE — Telephone Encounter (Signed)
Pharmacy states they have been faxing over a request for a manufacturer change for over a month and have no heard anything back. They would like this done as soon as possible as pt is out of rx.   levothyroxine (SYNTHROID) 125 MCG tablet   [facility] - [place], [place] - [address] [place] [facility] [place] [address] Phone: [phone]  Fax: [phone]

## 2023-03-25 NOTE — Telephone Encounter (Signed)
Request faxed back to [facility]

## 2023-03-29 ENCOUNTER — Encounter: Payer: Self-pay | Admitting: Family Medicine

## 2023-03-29 DIAGNOSIS — E039 Hypothyroidism, unspecified: Secondary | ICD-10-CM

## 2023-03-31 ENCOUNTER — Inpatient Hospital Stay (HOSPITAL_BASED_OUTPATIENT_CLINIC_OR_DEPARTMENT_OTHER): Admission: RE | Admit: 2023-03-31 | Payer: 59 | Source: Ambulatory Visit

## 2023-03-31 MED ORDER — LEVOTHYROXINE SODIUM 125 MCG PO TABS: 125.0000 ug | ORAL_TABLET | Freq: Every day | ORAL | 1 refills | Status: DC

## 2023-04-07 ENCOUNTER — Encounter (HOSPITAL_BASED_OUTPATIENT_CLINIC_OR_DEPARTMENT_OTHER): Payer: Self-pay

## 2023-04-07 ENCOUNTER — Other Ambulatory Visit (HOSPITAL_BASED_OUTPATIENT_CLINIC_OR_DEPARTMENT_OTHER): Payer: Self-pay

## 2023-04-07 ENCOUNTER — Ambulatory Visit (HOSPITAL_BASED_OUTPATIENT_CLINIC_OR_DEPARTMENT_OTHER)
Admission: RE | Admit: 2023-04-07 | Discharge: 2023-04-07 | Disposition: A | Discharge status: Home / Self Care | Payer: 59 | Source: Ambulatory Visit | Attending: Family Medicine | Admitting: Family Medicine

## 2023-04-07 DIAGNOSIS — Z1231 Encounter for screening mammogram for malignant neoplasm of breast: Secondary | ICD-10-CM | POA: Diagnosis present

## 2023-04-07 MED ORDER — INFLUENZA VAC A&B SURF ANT ADJ 0.5 ML IM SUSY
0.5000 mL | PREFILLED_SYRINGE | Freq: Once | INTRAMUSCULAR | 0 refills | Status: AC
  Filled 2023-04-07 (×2): qty 0.5, 1d supply, fill #0

## 2023-04-07 MED ORDER — COVID-19 MRNA VAC-TRIS(PFIZER) 30 MCG/0.3ML IM SUSY
0.3000 mL | PREFILLED_SYRINGE | Freq: Once | INTRAMUSCULAR | 0 refills | Status: AC
  Filled 2023-04-07 (×2): qty 0.3, 1d supply, fill #0

## 2023-04-14 MED ORDER — SERTRALINE HCL 100 MG PO TABS: 100.0000 mg | ORAL_TABLET | Freq: Every day | ORAL | 0 refills | Status: DC

## 2023-04-14 NOTE — Addendum Note (Signed)
Addended byConrad Colfax D on: 04/14/2023 12:52 PM   Modules accepted: Orders

## 2023-05-22 ENCOUNTER — Encounter: Payer: Self-pay | Admitting: Family Medicine

## 2023-06-04 ENCOUNTER — Other Ambulatory Visit: Payer: Self-pay | Admitting: Family Medicine

## 2023-06-04 DIAGNOSIS — R7303 Prediabetes: Secondary | ICD-10-CM

## 2023-07-11 ENCOUNTER — Encounter: Payer: Self-pay | Admitting: Family Medicine

## 2023-07-11 DIAGNOSIS — E039 Hypothyroidism, unspecified: Secondary | ICD-10-CM

## 2023-07-11 DIAGNOSIS — E785 Hyperlipidemia, unspecified: Secondary | ICD-10-CM

## 2023-07-11 DIAGNOSIS — E559 Vitamin D deficiency, unspecified: Secondary | ICD-10-CM

## 2023-07-11 MED ORDER — LOPERAMIDE HCL 2 MG PO CAPS: 2.0000 mg | ORAL_CAPSULE | Freq: Two times a day (BID) | ORAL | 1 refills | Status: DC

## 2023-07-11 MED ORDER — LEVOTHYROXINE SODIUM 125 MCG PO TABS: 125.0000 ug | ORAL_TABLET | Freq: Every day | ORAL | 1 refills | Status: DC

## 2023-07-11 MED ORDER — SIMVASTATIN 20 MG PO TABS: ORAL_TABLET | ORAL | 1 refills | Status: DC

## 2023-07-11 MED ORDER — VITAMIN D (ERGOCALCIFEROL) 1.25 MG (50000 UNIT) PO CAPS: 50000.0000 [IU] | ORAL_CAPSULE | ORAL | 3 refills | Status: DC

## 2023-07-15 MED ORDER — METFORMIN HCL 500 MG PO TABS: 500.0000 mg | ORAL_TABLET | Freq: Every day | ORAL | 1 refills | Status: DC

## 2023-07-15 NOTE — Addendum Note (Signed)
Addended by: Roxanne Gates on: 07/15/2023 09:34 AM   Modules accepted: Orders

## 2023-07-19 ENCOUNTER — Other Ambulatory Visit: Payer: Self-pay | Admitting: Family Medicine

## 2023-07-19 DIAGNOSIS — F418 Other specified anxiety disorders: Secondary | ICD-10-CM

## 2023-08-22 ENCOUNTER — Telehealth: Payer: Self-pay | Admitting: Family Medicine

## 2023-08-22 NOTE — Telephone Encounter (Signed)
 Copied from CRM [id number]. Topic: Appointments - Appointment Scheduling >> Aug 22, 2023  1:34 PM Tiffany H wrote: Patient/patient representative is calling to schedule an appointment. Refer to attachments for appointment information.   Patient needs to schedule first Welcome to Medicare visit appointment. Template not available. Please call patient back with date and time.   Patient would prefer 2/17, 2/18, 2/19 dates. Please assist.

## 2023-09-02 ENCOUNTER — Ambulatory Visit: Payer: Medicare Other | Admitting: Family Medicine

## 2023-09-02 ENCOUNTER — Encounter: Payer: Self-pay | Admitting: Family Medicine

## 2023-09-02 VITALS — BP 138/72 | HR 73 | Temp 98.2°F | Resp 18 | Ht 60.2 in | Wt 201.0 lb

## 2023-09-02 DIAGNOSIS — F418 Other specified anxiety disorders: Secondary | ICD-10-CM

## 2023-09-02 DIAGNOSIS — E559 Vitamin D deficiency, unspecified: Secondary | ICD-10-CM

## 2023-09-02 DIAGNOSIS — E039 Hypothyroidism, unspecified: Secondary | ICD-10-CM

## 2023-09-02 DIAGNOSIS — Z0000 Encounter for general adult medical examination without abnormal findings: Secondary | ICD-10-CM

## 2023-09-02 DIAGNOSIS — E782 Mixed hyperlipidemia: Secondary | ICD-10-CM

## 2023-09-02 DIAGNOSIS — N950 Postmenopausal bleeding: Secondary | ICD-10-CM

## 2023-09-02 DIAGNOSIS — F32A Depression, unspecified: Secondary | ICD-10-CM | POA: Diagnosis not present

## 2023-09-02 DIAGNOSIS — E1165 Type 2 diabetes mellitus with hyperglycemia: Secondary | ICD-10-CM

## 2023-09-02 MED ORDER — SERTRALINE HCL 100 MG PO TABS: ORAL_TABLET | ORAL | 3 refills | Status: DC

## 2023-09-02 NOTE — Progress Notes (Unsigned)
Subjective:    [name] [name] is a 78 y.o. female who presents for a Welcome to Medicare exam.     Discussed the use of AI scribe software for clinical note transcription with the patient, who gave verbal consent to proceed.  History of Present Illness   [name] [name] is a 78 year old female who presents for a Welcome to Medicare visit.  She experienced a fall while putting on her pants, leading to significant anxiety about her ability to get up. She is afraid to get on her knees due to concerns about her knees.  She describes experiencing depressive symptoms, including a lack of interest or pleasure in activities more than half the days, and overeating nearly every day for the past month. She feels bad about herself daily but denies feeling down, depressed, or hopeless. No trouble concentrating, moving or speaking slowly, or thoughts of self-harm. These issues have not significantly affected her work or social interactions, except at home. She is currently taking Zoloft 100 mg and feels it helps maintain stability, although she experiences a seasonal decline in mood during winter.  She mentions spotting for the last two and a half weeks, which started as pink stains and has now increased slightly. She had a dilation and curettage a few years ago and is concerned about the current spotting.  She is not currently engaging in exercise but wants to resume walking and using a recumbent bike. She lives alone and has noticed weight gain recently.  She has not received the shingles vaccine and is unsure about the pneumonia vaccine status. She is interested in receiving the RSV vaccine.          Objective:    Today's Vitals   09/02/23 1330  BP: 138/72  Pulse: 73  Resp: 18  Temp: 98.2 F (36.8 C)  TempSrc: Oral  SpO2: 98%  Weight: 201 lb (91.2 kg)  Height: 5' 0.2" (1.529 m)  Body mass index is 38.99 kg/m.  Medications Outpatient Encounter Medications as of 09/02/2023  Medication  Sig   Biotin 5000 MCG TABS Take 5,000 mcg by mouth every evening.   Collagen-Vitamin C-Biotin (COLLAGEN 1500/C PO) Take 1 Scoop by mouth in the morning. With coffee   cycloSPORINE (RESTASIS) 0.05 % ophthalmic emulsion    diphenhydramine-acetaminophen (TYLENOL PM) 25-500 MG TABS tablet Take 1 tablet by mouth at bedtime as needed (pain/sleep).   levothyroxine (SYNTHROID) 125 MCG tablet Take 1 tablet (125 mcg total) by mouth daily before breakfast.   loperamide (IMODIUM) 2 MG capsule Take 1 capsule (2 mg total) by mouth 2 (two) times daily.   Melatonin 3 MG CAPS Take 3 mg by mouth at bedtime as needed (sleep).   metFORMIN (GLUCOPHAGE) 500 MG tablet Take 1 tablet (500 mg total) by mouth daily with breakfast.   Multiple Vitamins-Minerals (CENTRUM SILVER) tablet Take 1 tablet by mouth every evening.   Propylene Glycol (SYSTANE COMPLETE) 0.6 % SOLN Place 1 drop into both eyes as needed (dry eyes).   simvastatin (ZOCOR) 20 MG tablet TAKE 1 TABLET BY MOUTH AT  BEDTIME   Sod Fluoride-Potassium Nitrate (PREVIDENT 5000 ENAMEL PROTECT) 1.1-5 % GEL Place 1 Application onto teeth at bedtime.   Vitamin D, Ergocalciferol, (DRISDOL) 1.25 MG (50000 UNIT) CAPS capsule Take 1 capsule (50,000 Units total) by mouth every 7 (seven) days.   [DISCONTINUED] sertraline (ZOLOFT) 100 MG tablet TAKE 1 TABLET(100 MG) BY MOUTH DAILY   sertraline (ZOLOFT) 100 MG tablet 1 1/2 po every  day   No facility-administered encounter medications on file as of 09/02/2023.     History: Past Medical History:  Diagnosis Date   Allergic rhinitis    Anxiety    Arthritis    Cataract    Depression    Diabetes mellitus without complication (HCC)    borderline   Emphysema lung (HCC)    Early stage   Endometrial polyp    Heart murmur    as a child   Hyperlipidemia    Hyperlipidemia    on simvastatin   Hypertension    Hypothyroidism    IBS (irritable bowel syndrome)    Obesity    Osteoarthritis    Seasonal allergies    Sleep  apnea    no cpap   Vertigo 09/2022   Past Surgical History:  Procedure Laterality Date   BUNIONECTOMY  2009   Dr.Duda, left   CARPAL TUNNEL RELEASE Right 11/28/2021   Procedure: RIGHT CARPAL TUNNEL RELEASE;  Surgeon: Marlyne Beards, MD;  Location: [facility];  Service: Orthopedics;  Laterality: Right;   EYE SURGERY     HYSTEROSCOPY WITH D & C  04/24/2011   Procedure: DILATATION AND CURETTAGE (D&C) /HYSTEROSCOPY;  Surgeon: Hal Morales, MD;  Location: [facility];  Service: Gynecology;  Laterality: N/A;   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   Dr Harriet Pho FASCIA SURGERY  1994   Dr Eulah Pont   POLYPECTOMY  04/24/2011   Procedure: POLYPECTOMY;  Surgeon: Hal Morales, MD;  Location: [facility];  Service: Gynecology;  Laterality: N/A;   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   Procedure: TOTAL KNEE ARTHROPLASTY;  Surgeon: Ollen Gross, MD;  Location: [facility];  Service: Orthopedics;  Laterality: Right;   TOTAL KNEE ARTHROPLASTY Left 10/07/2022   Procedure: TOTAL KNEE ARTHROPLASTY;  Surgeon: Ollen Gross, MD;  Location: [facility];  Service: Orthopedics;  Laterality: Left;    Family History  Problem Relation Age of Onset   Colon cancer Father 59   Cancer Father 87       colon   Alcoholism Father    Pancreatic cancer Mother 65   Cancer Mother 45       pancreatic   Colon cancer Paternal Grandmother    Cancer Paternal Grandmother 63       colon   Heart attack Maternal Grandmother 50   Heart disease Maternal Grandmother    Cancer Sister 65       melanoma   Breast cancer Sister    Cancer Brother 2       skin   Thyroid disease Sister    Breast cancer Maternal Aunt 30   Heart attack Maternal Aunt 72   Breast cancer Sister    Social History   Occupational History   Occupation: Daycare p/t    Comment: retired  Tobacco Use   Smoking status: Former    Current packs/day: 0.00    Average packs/day: 2.0 packs/day for 42.0 years (84.0 ttl pk-yrs)    Types:  Cigarettes    Start date: 07/28/1970    Quit date: 07/28/2012    Years since quitting: 11.1   Smokeless tobacco: Never  Vaping Use   Vaping status: Former  Substance and Sexual Activity   Alcohol use: Yes    Comment: rare  2-3 x a year   Drug use: Never   Sexual activity: Yes    Partners: Male    Birth control/protection: Post-menopausal    Tobacco Counseling Counseling given: Not  Answered   Immunizations and Health Maintenance Immunization History  Administered Date(s) Administered   Fluad Quad(high Dose 65+) 04/13/2019, 04/16/2021, 05/24/2022   Fluad Trivalent(High Dose 65+) 04/07/2023   Influenza Whole 05/30/2009, 05/29/2010, 06/20/2011, 05/30/2012, 05/15/2013   Influenza, High Dose Seasonal PF 06/18/2016, 05/13/2017, 05/12/2018   Influenza,inj,Quad PF,6+ Mos 04/28/2015   Influenza,inj,quad, With Preservative 04/15/2015   Influenza-Unspecified 04/14/2014, 05/13/2017, 05/12/2018   MMR 11/28/2017   PFIZER(Purple Top)SARS-COV-2 Vaccination 09/09/2019, 10/05/2019, 05/19/2020   Pfizer Covid-19 Vaccine Bivalent Booster 84yrs & up 01/12/2021   Pfizer(Comirnaty)Fall Seasonal Vaccine 12 years and older 05/24/2022, 04/07/2023   Pneumococcal Conjugate-13 07/23/2013   Pneumococcal Polysaccharide-23 02/01/2013   Td 01/17/2010   Tdap 03/03/2023   Zoster Recombinant(Shingrix) 04/16/2021   Zoster, Live 01/17/2010   Health Maintenance Due  Topic Date Due   Pneumonia Vaccine 60+ Years old (3 of 3 - PPSV23 or PCV20) 02/01/2018   Zoster Vaccines- Shingrix (2 of 2) 06/11/2021   OPHTHALMOLOGY EXAM  06/11/2023    Activities of Daily Living    09/01/2023    4:16 PM 10/07/2022   11:20 AM  In your present state of health, do you have any difficulty performing the following activities:  Hearing? 0 0  Vision? 0 0  Difficulty concentrating or making decisions? 0 0  Walking or climbing stairs? 0 1  Dressing or bathing? 0 0  Doing errands, shopping? 0 0  Preparing Food and eating ? N    Using the Toilet? N   In the past six months, have you accidently leaked urine? Y   Do you have problems with loss of bowel control? N   Managing your Medications? N   Managing your Finances? N   Housekeeping or managing your Housekeeping? N     Physical Exam   Physical Exam Vitals and nursing note reviewed.  Constitutional:      General: She is not in acute distress.    Appearance: Normal appearance. She is well-developed.  HENT:     Head: Normocephalic and atraumatic.     Right Ear: Tympanic membrane, ear canal and external ear normal. There is no impacted cerumen.     Left Ear: Tympanic membrane, ear canal and external ear normal. There is no impacted cerumen.     Nose: Nose normal.     Mouth/Throat:     Mouth: Mucous membranes are moist.     Pharynx: Oropharynx is clear. No oropharyngeal exudate or posterior oropharyngeal erythema.  Eyes:     General: No scleral icterus.       Right eye: No discharge.        Left eye: No discharge.     Conjunctiva/sclera: Conjunctivae normal.     Pupils: Pupils are equal, round, and reactive to light.  Neck:     Thyroid: No thyromegaly or thyroid tenderness.     Vascular: No JVD.  Cardiovascular:     Rate and Rhythm: Normal rate and regular rhythm.     Heart sounds: Normal heart sounds. No murmur heard. Pulmonary:     Effort: Pulmonary effort is normal. No respiratory distress.     Breath sounds: Normal breath sounds.  Abdominal:     General: Bowel sounds are normal. There is no distension.     Palpations: Abdomen is soft. There is no mass.     Tenderness: There is no abdominal tenderness. There is no guarding or rebound.  Genitourinary:    Vagina: Normal.  Musculoskeletal:        General: Normal range  of motion.     Cervical back: Normal range of motion and neck supple.     Right lower leg: No edema.     Left lower leg: No edema.  Lymphadenopathy:     Cervical: No cervical adenopathy.  Skin:    General: Skin is warm and dry.      Findings: No erythema or rash.  Neurological:     Mental Status: She is alert and oriented to person, place, and time.     Cranial Nerves: No cranial nerve deficit.     Deep Tendon Reflexes: Reflexes are normal and symmetric.  Psychiatric:        Mood and Affect: Mood normal.        Behavior: Behavior normal.        Thought Content: Thought content normal.        Judgment: Judgment normal.    (optional), or other factors deemed appropriate based on the beneficiary's medical and social history and current clinical standards.   Advanced Directives:    EKG:  normal EKG, normal sinus rhythm, unchanged from previous tracings      Assessment:    This is a routine wellness examination for this patient .   Vision/Hearing screen Hearing Screening   500Hz  1000Hz  2000Hz  4000Hz   Right ear Pass Pass Fail Pass  Left ear Pass Pass Pass Pass  Comments: Normal whisper   Vision Screening   Right eye Left eye Both eyes  Without correction     With correction 20/25 20/20 20/20      Goals   None     Depression Screen    09/02/2023    1:42 PM 03/03/2023    1:15 PM 09/10/2022    2:23 PM 01/17/2022   11:08 AM  PHQ 2/9 Scores  PHQ - 2 Score 2 1 0 0  PHQ- 9 Score 8  0      Fall Risk    09/01/2023    4:16 PM  Fall Risk   Falls in the past year? 1  Number falls in past yr: 0  Injury with Fall? 0    Cognitive Function:    09/02/2023    2:21 PM  MMSE - Mini Mental State Exam  Orientation to time 5  Orientation to Place 5  Registration 3  Attention/ Calculation 5  Recall 3  Language- name 2 objects 2  Language- repeat 1  Language- follow 3 step command 3  Language- read & follow direction 1  Write a sentence 1  Copy design 1  Total score 30      01/09/2022    2:42 PM  Montreal Cognitive Assessment   Visuospatial/ Executive (0/5) 4  Naming (0/3) 3  Attention: Read list of digits (0/2) 1  Attention: Read list of letters (0/1) 1  Attention: Serial 7 subtraction  starting at 100 (0/3) 3  Language: Repeat phrase (0/2) 2  Language : Fluency (0/1) 1  Abstraction (0/2) 2  Delayed Recall (0/5) 5  Orientation (0/6) 5  Total 27      Patient Care Team: Donato Schultz, DO as PCP - General Haygood, Maris Berger, MD (Inactive) as Consulting Physician (Obstetrics and Gynecology) Ollen Gross, MD as Consulting Physician (Orthopedic Surgery)     Plan:     I have personally reviewed and noted the following in the patient's chart:   Medical and social history Use of alcohol, tobacco or illicit drugs  Current medications and supplements Functional ability and status Nutritional status  Physical activity Advanced directives List of other physicians Hospitalizations, surgeries, and ER visits in previous 12 months Vitals Screenings to include cognitive, depression, and falls Referrals and appointments  In addition, I have reviewed and discussed with patient certain preventive protocols, quality metrics, and best practice recommendations. A written personalized care plan for preventive services as well as general preventive health recommendations were provided to patient.   Assessment and Plan    Postmenopausal Bleeding   Reports spotting for the last two and a half weeks, with a history of D&C a few years ago. Bleeding began as pink stains and has slightly increased. Prefers to see an Web designer at [facility] [employer] if she accepts Medicare. Refer to OBGYN for evaluation and check if she accepts Medicare.  Depression   Experiences depression, particularly in winter, with poor appetite and overeating nearly every day for the last month. Has a lifelong history of low self-esteem. Currently on Zoloft 100 mg, which maintains stability but does not fully alleviate symptoms. Agreed to increase Zoloft to 150 mg during winter months. Monitor response to increased dosage.  Fall Risk   Experienced a fall while dressing, leading to increased anxiety about  falling again. Afraid to get on knees due to stability concerns. Blood pressure was 138, acceptable given the circumstances. Encourage safe practices to prevent falls and consider referral to physical therapy for balance training if falls continue.  Exercise and Weight Management   Not engaging in regular exercise and has been gaining weight. Desires to resume walking and using a recumbent bike. Encourage resumption of walking and recumbent bike exercises. Discuss weight management strategies.  General Health Maintenance   Requires updates on several vaccinations and screenings. Has not received shingles, pneumonia, or RSV vaccines. Needs an eye exam and an EKG. [facility] at [facility] may have some vaccination records. Administer shingles vaccine (2-part series), pneumonia vaccine, and RSV vaccine. Perform EKG. Request eye exam records from [facility] and vaccination records from [place] at [place].  Follow-up   Follow up after labs are completed. Schedule follow-up visit to review lab results and response to increased Zoloft dosage.        Lelon Perla Chase, DO 09/04/2023

## 2023-09-03 LAB — COMPREHENSIVE METABOLIC PANEL
AG Ratio: 2 (calc) (ref 1.0–2.5)
ALT: 15 U/L (ref 6–29)
AST: 21 U/L (ref 10–35)
Albumin: 4.5 g/dL (ref 3.6–5.1)
Alkaline phosphatase (APISO): 76 U/L (ref 37–153)
BUN/Creatinine Ratio: 47 (calc) — ABNORMAL HIGH (ref 6–22)
BUN: 28 mg/dL — ABNORMAL HIGH (ref 7–25)
CO2: 29 mmol/L (ref 20–32)
Calcium: 10 mg/dL (ref 8.6–10.4)
Chloride: 101 mmol/L (ref 98–110)
Creat: 0.59 mg/dL — ABNORMAL LOW (ref 0.60–1.00)
Globulin: 2.3 g/dL (ref 1.9–3.7)
Glucose, Bld: 90 mg/dL (ref 65–99)
Potassium: 4.3 mmol/L (ref 3.5–5.3)
Sodium: 139 mmol/L (ref 135–146)
Total Bilirubin: 0.4 mg/dL (ref 0.2–1.2)
Total Protein: 6.8 g/dL (ref 6.1–8.1)

## 2023-09-03 LAB — LIPID PANEL
Cholesterol: 181 mg/dL (ref <200)
HDL: 86 mg/dL (ref >=50)
LDL Cholesterol (Calc): 78 mg/dL
Non-HDL Cholesterol (Calc): 95 mg/dL (ref <130)
Total CHOL/HDL Ratio: 2.1 (calc) (ref <5.0)
Triglycerides: 86 mg/dL (ref <150)

## 2023-09-03 LAB — CBC WITH DIFFERENTIAL/PLATELET
Absolute Lymphocytes: 1366 {cells}/uL (ref 850–3900)
Absolute Monocytes: 433 {cells}/uL (ref 200–950)
Basophils Absolute: 43 {cells}/uL (ref 0–200)
Basophils Relative: 0.7 %
Eosinophils Absolute: 79 {cells}/uL (ref 15–500)
Eosinophils Relative: 1.3 %
HCT: 42.9 % (ref 35.0–45.0)
Hemoglobin: 14.3 g/dL (ref 11.7–15.5)
MCH: 30.6 pg (ref 27.0–33.0)
MCHC: 33.3 g/dL (ref 32.0–36.0)
MCV: 91.9 fL (ref 80.0–100.0)
MPV: 10.8 fL (ref 7.5–12.5)
Monocytes Relative: 7.1 %
Neutro Abs: 4179 {cells}/uL (ref 1500–7800)
Neutrophils Relative %: 68.5 %
Platelets: 177 10*3/uL (ref 140–400)
RBC: 4.67 10*6/uL (ref 3.80–5.10)
RDW: 13.2 % (ref 11.0–15.0)
Total Lymphocyte: 22.4 %
WBC: 6.1 10*3/uL (ref 3.8–10.8)

## 2023-09-03 LAB — THYROID PANEL WITH TSH
Free Thyroxine Index: 2.8 (ref 1.4–3.8)
T3 Uptake: 34 % (ref 22–35)
T4, Total: 8.1 ug/dL (ref 5.1–11.9)
TSH: 2.99 m[IU]/L (ref 0.40–4.50)

## 2023-09-03 LAB — MICROALBUMIN / CREATININE URINE RATIO
Creatinine, Urine: 15 mg/dL — ABNORMAL LOW (ref 20–275)
Microalb Creat Ratio: 20 mg/g{creat} (ref <30)
Microalb, Ur: 0.3 mg/dL

## 2023-09-03 LAB — HEMOGLOBIN A1C
Hgb A1c MFr Bld: 5.5 %{Hb} (ref <5.7)
Mean Plasma Glucose: 111 mg/dL
eAG (mmol/L): 6.2 mmol/L

## 2023-09-10 ENCOUNTER — Encounter: Payer: Self-pay | Admitting: Family Medicine

## 2023-09-16 LAB — HM DIABETES EYE EXAM

## 2023-11-12 ENCOUNTER — Telehealth: Payer: Self-pay | Admitting: *Deleted

## 2023-11-12 NOTE — Telephone Encounter (Signed)
 Spoke with the patient regarding the referral to GYN oncology. Patient scheduled as new patient with Dr Orvil Bland 5/9 at 10:30 am.  Patient given an arrival time of 10 am.  Explained to the patient the the doctor will perform a pelvic exam at this visit. Patient given the policy that only one visitor allowed and that visitor must be over 16 yrs are allowed in the Cancer Center. Patient given the address/phone number for the clinic and that the center offers free valet service. Patient aware that masks required.

## 2023-11-19 ENCOUNTER — Encounter: Payer: Self-pay | Admitting: Gynecologic Oncology

## 2023-11-20 ENCOUNTER — Encounter: Payer: Self-pay | Admitting: Gynecologic Oncology

## 2023-11-20 NOTE — H&P (View-Only) (Signed)
 GYNECOLOGIC ONCOLOGY NEW PATIENT CONSULTATION   Patient Name: [name] [name]  Patient Age: 78 y.o. Date of Service: 11/21/23 Referring Provider: Thurman Flores, MD  Primary Care Provider: Crecencio Dodge, Candida Chalk, DO Consulting Provider: Wiley Hanger, MD   Assessment/Plan:  Postmenopausal patient with endometrial cancer.  We reviewed the nature of endometrial cancer and its recommended surgical staging, including total hysterectomy, bilateral salpingo-oophorectomy, and lymph node assessment. The patient is a suitable candidate for staging via a minimally invasive approach to surgery.  We reviewed that robotic assistance would be used to complete the surgery.   We discussed that most endometrial cancer is detected early and that decisions regarding adjuvant therapy will be made based on her final pathology.   We reviewed the sentinel lymph node technique. Risks and benefits of sentinel lymph node biopsy was reviewed. We reviewed the technique and ICG dye. The patient DOES NOT have an iodine  allergy or known liver dysfunction. We reviewed the false negative rate (0.4%), and that 3% of patients with metastatic disease will not have it detected by SLN biopsy in endometrial cancer. A low risk of allergic reaction to the dye, <0.2% for ICG, has been reported. We also discussed that in the case of failed mapping, which occurs 40% of the time, a bilateral or unilateral lymphadenectomy will be performed at the surgeon's discretion.   Potential benefits of sentinel nodes including a higher detection rate for metastasis due to ultrastaging and potential reduction in operative morbidity. However, there remains uncertainty as to the role for treatment of micrometastatic disease. Further, the benefit of operative morbidity associated with the SLN technique in endometrial cancer is not yet completely known. In other patient populations (e.g. the cervical cancer population) there has been observed reductions in  morbidity with SLN biopsy compared to pelvic lymphadenectomy. Lymphedema, nerve dysfunction and lymphocysts are all potential risks with the SLN technique as with complete lymphadenectomy. Additional risks to the patient include the risk of damage to an internal organ while operating in an altered view (e.g. the black and white image of the robotic fluorescence imaging mode).   We discussed the plan for a robotic assisted hysterectomy, bilateral salpingo-oophorectomy, sentinel lymph node evaluation, possible lymph node dissection, possible laparotomy. The risks of surgery were discussed in detail and she understands these to include infection; wound separation; hernia; vaginal cuff separation, injury to adjacent organs such as bowel, bladder, blood vessels, ureters and nerves; bleeding which may require blood transfusion; anesthesia risk; thromboembolic events; possible death; unforeseen complications; possible need for re-exploration; medical complications such as heart attack, stroke, pleural effusion and pneumonia; and, if full lymphadenectomy is performed the risk of lymphedema and lymphocyst. The patient will receive DVT and antibiotic prophylaxis as indicated. She voiced a clear understanding. She had the opportunity to ask questions. Perioperative instructions were reviewed with her. Prescriptions for post-op medications were sent to her pharmacy of choice.  Given her family history as well as MMR IHC results from her recent biopsy, referral to genetics to consider germline testing recommended.  Patient amenable.  A copy of this note was sent to the patient's referring provider.   70 minutes of total time was spent for this patient encounter, including preparation, face-to-face counseling with the patient and coordination of care, and documentation of the encounter.  Wiley Hanger, MD  Division of Gynecologic Oncology  Department of Obstetrics and Gynecology  [facility]   [facility]  ___________________________________________  Chief Complaint: Chief Complaint  Patient presents with   endometrial  cancer    History of Present Illness:  [name] [name] is a 78 y.o. y.o. female who is seen in consultation at the request of Dr. Serge Dancer for an evaluation of endometrial cancer.  Developed vaginal bleeding in February.  Describes this as pink spotting initially that then increased in quantity.  Denies any heavy bleeding ever. 09/26/23: Pelvic ultrasound - uterus 6 x 3.8 x 2.7 cm with endometrium measuring 13.3 mm.  11/06/23: D&C. 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  She endorses about 2 weeks of gradually decreasing bleeding after her biopsy.  Today, she is feeling good.  Denies any pelvic pain or cramping.  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  She denies any urinary symptoms.  Reports good appetite without nausea or emesis.  Denies recent weight changes.  She endorses history of prediabetes for which she takes metformin  daily.  PAST MEDICAL HISTORY:  Past Medical History:  Diagnosis Date   Allergic rhinitis    Anxiety    Arthritis    Cataract    Depression    Diabetes mellitus without complication (HCC)    borderline   Emphysema lung (HCC)    Early stage   Endometrial polyp    Heart murmur    as a child   Hyperlipidemia    Hyperlipidemia    on simvastatin    Hypertension    Hypothyroidism    IBS (irritable bowel syndrome)    Obesity    Osteoarthritis    Seasonal allergies    Sleep apnea    no cpap, borderline diagnosis   Uterine cancer (HCC)    Vertigo 09/2022     PAST SURGICAL HISTORY:  Past Surgical History:  Procedure Laterality Date   BUNIONECTOMY  2009   Dr.Duda, left   CARPAL TUNNEL RELEASE Right 11/28/2021   Procedure: RIGHT CARPAL TUNNEL RELEASE;  Surgeon: Marilyn Shropshire, MD;  Location: [facility];  Service: Orthopedics;  Laterality: Right;   EYE SURGERY     HYSTEROSCOPY WITH D & C  04/24/2011   Procedure: DILATATION AND CURETTAGE (D&C) /HYSTEROSCOPY;  Surgeon: Stevenson Elbe, MD;  Location: [facility];  Service: Gynecology;  Laterality: N/A;   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   Dr Cherre Cornish FASCIA SURGERY  1994   Dr Abigail Abler   POLYPECTOMY  04/24/2011   Procedure: POLYPECTOMY;  Surgeon: Stevenson Elbe, MD;  Location: [facility];  Service: Gynecology;  Laterality: N/A;   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   Procedure: TOTAL KNEE ARTHROPLASTY;  Surgeon: Liliane Rei, MD;  Location: [facility];  Service: Orthopedics;  Laterality: Right;   TOTAL KNEE ARTHROPLASTY Left 10/07/2022   Procedure: TOTAL KNEE ARTHROPLASTY;  Surgeon: Liliane Rei, MD;  Location: [facility];  Service: Orthopedics;  Laterality: Left;    OB/GYN HISTORY:  OB History  Gravida Para Term Preterm AB Living  0 0 0 0 0 0  SAB IAB Ectopic Multiple Live Births  0 0 0 0     No LMP recorded. Patient is postmenopausal.  Age at menarche: 70  Age at menopause: 3 Hx of HRT: denies Hx of STDs: denies Last pap: 2021 History of abnormal pap smears: denies  SCREENING STUDIES:  Last mammogram: 2024  Last colonoscopy: 2022  MEDICATIONS: Outpatient Encounter Medications as of 11/21/2023  Medication Sig   Biotin 5000 MCG TABS Take 5,000 mcg by mouth every  evening.   Collagen-Vitamin C-Biotin (COLLAGEN 1500/C PO) Take 1 Scoop by mouth in the morning. With coffee   cycloSPORINE  (RESTASIS ) 0.05 % ophthalmic emulsion    diphenhydramine -acetaminophen  (TYLENOL  PM) 25-500 MG TABS tablet Take 1 tablet by mouth at bedtime as needed (pain/sleep).   levothyroxine  (SYNTHROID ) 125 MCG tablet Take 1 tablet (125 mcg total) by mouth daily before breakfast.   loperamide  (IMODIUM ) 2 MG capsule Take 1 capsule (2 mg total) by mouth 2 (two) times daily.   Melatonin 3 MG CAPS Take 3 mg by mouth at bedtime as needed (sleep).    metFORMIN  (GLUCOPHAGE ) 500 MG tablet Take 1 tablet (500 mg total) by mouth daily with breakfast.   Multiple Vitamins-Minerals (CENTRUM SILVER) tablet Take 1 tablet by mouth every evening.   Propylene Glycol (SYSTANE COMPLETE) 0.6 % SOLN Place 1 drop into both eyes as needed (dry eyes).   sertraline  (ZOLOFT ) 100 MG tablet 1 1/2 po every day   simvastatin  (ZOCOR ) 20 MG tablet TAKE 1 TABLET BY MOUTH AT  BEDTIME   Vitamin D , Ergocalciferol , (DRISDOL ) 1.25 MG (50000 UNIT) CAPS capsule Take 1 capsule (50,000 Units total) by mouth every 7 (seven) days.   [DISCONTINUED] Sod Fluoride-Potassium Nitrate (PREVIDENT 5000 ENAMEL PROTECT) 1.1-5 % GEL Place 1 Application onto teeth at bedtime. (Patient not taking: Reported on 11/19/2023)   No facility-administered encounter medications on file as of 11/21/2023.    ALLERGIES:  Allergies  Allergen Reactions   Tape Other (See Comments)    Red, swollen, itching     FAMILY HISTORY:  Family History  Problem Relation Age of Onset   Pancreatic cancer Mother 68   Cancer Mother 86       pancreatic   Colon cancer Father 19   Cancer Father 54       colon   Alcoholism Father    Cancer Sister 75       melanoma   Breast cancer Sister    Thyroid  disease Sister    Cancer Sister        melanoma   Breast cancer Sister    Cancer Brother 32       skin   Heart attack Maternal Grandmother 50   Heart disease Maternal Grandmother    Colon cancer Paternal Grandmother    Cancer Paternal Grandmother 74       colon   Breast cancer Maternal Aunt 30   Heart attack Maternal Aunt 60   Prostate cancer Neg Hx    Ovarian cancer Neg Hx    Endometrial cancer Neg Hx      SOCIAL HISTORY:  Social Connections: Unknown (09/01/2023)   Social Connection and Isolation Panel [NHANES]    Frequency of Communication with Friends and Family: Three times a week    Frequency of Social Gatherings with Friends and Family: Twice a week    Attends Religious Services: Patient declined     Database administrator or Organizations: No    Attends Engineer, structural: Not on file    Marital Status: Widowed    REVIEW OF SYSTEMS:  Denies appetite changes, fevers, chills, fatigue, unexplained weight changes. Denies hearing loss, neck lumps or masses, mouth sores, ringing in ears or voice changes. Denies cough or wheezing.  Denies shortness of breath. Denies chest pain or palpitations. Denies leg swelling. Denies abdominal distention, pain, blood in stools, constipation, diarrhea, nausea, vomiting, or early satiety. Denies pain with intercourse, dysuria, frequency, hematuria or incontinence. Denies hot flashes, pelvic pain, vaginal bleeding  or vaginal discharge.   Denies joint pain, back pain or muscle pain/cramps. Denies itching, rash, or wounds. Denies dizziness, headaches, numbness or seizures. Denies swollen lymph nodes or glands, denies easy bruising or bleeding. Denies anxiety, depression, confusion, or decreased concentration.  Physical Exam:  Vital Signs for this encounter:  Blood pressure 134/74, pulse 61, temperature 98.2 F (36.8 C), temperature source Oral, resp. rate 18, height 5' 0.51" (1.537 m), weight 200 lb 12.8 oz (91.1 kg), SpO2 98%. Body mass index is 38.56 kg/m. General: Alert, oriented, no acute distress.  HEENT: Normocephalic, atraumatic. Sclera anicteric.  Chest: Clear to auscultation bilaterally. No wheezes, rhonchi, or rales. Cardiovascular: Regular rate and rhythm, no murmurs, rubs, or gallops.  Abdomen: Obese. Normoactive bowel sounds. Soft, nondistended, nontender to palpation. No masses or hepatosplenomegaly appreciated. No palpable fluid wave.  Extremities: Grossly normal range of motion. Warm, well perfused. No edema bilaterally.  Skin: No rashes or lesions.  Lymphatics: No cervical, supraclavicular, or inguinal adenopathy.  GU:  Normal external female genitalia. No lesions. No discharge or bleeding.             Bladder/urethra:  No  lesions or masses, well supported bladder             Vagina: Moderately atrophic, no lesions.             Cervix: Normal appearing, no lesions.  Atrophic.             Uterus: Small, mobile, no parametrial involvement or nodularity.             Adnexa: No masses appreciated.  Rectal: Deferred.  LABORATORY AND RADIOLOGIC DATA:  Outside medical records were reviewed to synthesize the above history, along with the history and physical obtained during the visit.   Lab Results  Component Value Date   WBC 6.1 09/02/2023   HGB 14.3 09/02/2023   HCT 42.9 09/02/2023   PLT 177 09/02/2023   GLUCOSE 90 09/02/2023   CHOL 181 09/02/2023   TRIG 86 09/02/2023   HDL 86 09/02/2023   LDLDIRECT 129.1 06/13/2011   LDLCALC 78 09/02/2023   ALT 15 09/02/2023   AST 21 09/02/2023   NA 139 09/02/2023   K 4.3 09/02/2023   CL 101 09/02/2023   CREATININE 0.59 (L) 09/02/2023   BUN 28 (H) 09/02/2023   CO2 29 09/02/2023   TSH 2.99 09/02/2023   HGBA1C 5.5 09/02/2023   MICROALBUR 0.3 09/02/2023

## 2023-11-20 NOTE — Progress Notes (Signed)
 GYNECOLOGIC ONCOLOGY NEW PATIENT CONSULTATION   Patient Name: [name] [name]  Patient Age: 78 y.o. Date of Service: 11/21/23 Referring Provider: Thurman Flores, MD  Primary Care Provider: Crecencio Dodge, Candida Chalk, DO Consulting Provider: Wiley Hanger, MD   Assessment/Plan:  Postmenopausal patient with endometrial cancer.  We reviewed the nature of endometrial cancer and its recommended surgical staging, including total hysterectomy, bilateral salpingo-oophorectomy, and lymph node assessment. The patient is a suitable candidate for staging via a minimally invasive approach to surgery.  We reviewed that robotic assistance would be used to complete the surgery.   We discussed that most endometrial cancer is detected early and that decisions regarding adjuvant therapy will be made based on her final pathology.   We reviewed the sentinel lymph node technique. Risks and benefits of sentinel lymph node biopsy was reviewed. We reviewed the technique and ICG dye. The patient DOES NOT have an iodine  allergy or known liver dysfunction. We reviewed the false negative rate (0.4%), and that 3% of patients with metastatic disease will not have it detected by SLN biopsy in endometrial cancer. A low risk of allergic reaction to the dye, <0.2% for ICG, has been reported. We also discussed that in the case of failed mapping, which occurs 40% of the time, a bilateral or unilateral lymphadenectomy will be performed at the surgeon's discretion.   Potential benefits of sentinel nodes including a higher detection rate for metastasis due to ultrastaging and potential reduction in operative morbidity. However, there remains uncertainty as to the role for treatment of micrometastatic disease. Further, the benefit of operative morbidity associated with the SLN technique in endometrial cancer is not yet completely known. In other patient populations (e.g. the cervical cancer population) there has been observed reductions in  morbidity with SLN biopsy compared to pelvic lymphadenectomy. Lymphedema, nerve dysfunction and lymphocysts are all potential risks with the SLN technique as with complete lymphadenectomy. Additional risks to the patient include the risk of damage to an internal organ while operating in an altered view (e.g. the black and white image of the robotic fluorescence imaging mode).   We discussed the plan for a robotic assisted hysterectomy, bilateral salpingo-oophorectomy, sentinel lymph node evaluation, possible lymph node dissection, possible laparotomy. The risks of surgery were discussed in detail and she understands these to include infection; wound separation; hernia; vaginal cuff separation, injury to adjacent organs such as bowel, bladder, blood vessels, ureters and nerves; bleeding which may require blood transfusion; anesthesia risk; thromboembolic events; possible death; unforeseen complications; possible need for re-exploration; medical complications such as heart attack, stroke, pleural effusion and pneumonia; and, if full lymphadenectomy is performed the risk of lymphedema and lymphocyst. The patient will receive DVT and antibiotic prophylaxis as indicated. She voiced a clear understanding. She had the opportunity to ask questions. Perioperative instructions were reviewed with her. Prescriptions for post-op medications were sent to her pharmacy of choice.  Given her family history as well as MMR IHC results from her recent biopsy, referral to genetics to consider germline testing recommended.  Patient amenable.  A copy of this note was sent to the patient's referring provider.   70 minutes of total time was spent for this patient encounter, including preparation, face-to-face counseling with the patient and coordination of care, and documentation of the encounter.  Wiley Hanger, MD  Division of Gynecologic Oncology  Department of Obstetrics and Gynecology  [facility]   [facility]  ___________________________________________  Chief Complaint: Chief Complaint  Patient presents with   endometrial  cancer    History of Present Illness:  [name] [name] is a 78 y.o. y.o. female who is seen in consultation at the request of Dr. Serge Dancer for an evaluation of endometrial cancer.  Developed vaginal bleeding in February.  Describes this as pink spotting initially that then increased in quantity.  Denies any heavy bleeding ever. 09/26/23: Pelvic ultrasound - uterus 6 x 3.8 x 2.7 cm with endometrium measuring 13.3 mm.  11/06/23: D&C. 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  She endorses about 2 weeks of gradually decreasing bleeding after her biopsy.  Today, she is feeling good.  Denies any pelvic pain or cramping.  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  She denies any urinary symptoms.  Reports good appetite without nausea or emesis.  Denies recent weight changes.  She endorses history of prediabetes for which she takes metformin  daily.  PAST MEDICAL HISTORY:  Past Medical History:  Diagnosis Date   Allergic rhinitis    Anxiety    Arthritis    Cataract    Depression    Diabetes mellitus without complication (HCC)    borderline   Emphysema lung (HCC)    Early stage   Endometrial polyp    Heart murmur    as a child   Hyperlipidemia    Hyperlipidemia    on simvastatin    Hypertension    Hypothyroidism    IBS (irritable bowel syndrome)    Obesity    Osteoarthritis    Seasonal allergies    Sleep apnea    no cpap, borderline diagnosis   Uterine cancer (HCC)    Vertigo 09/2022     PAST SURGICAL HISTORY:  Past Surgical History:  Procedure Laterality Date   BUNIONECTOMY  2009   Dr.Duda, left   CARPAL TUNNEL RELEASE Right 11/28/2021   Procedure: RIGHT CARPAL TUNNEL RELEASE;  Surgeon: Marilyn Shropshire, MD;  Location: [facility];  Service: Orthopedics;  Laterality: Right;   EYE SURGERY     HYSTEROSCOPY WITH D & C  04/24/2011   Procedure: DILATATION AND CURETTAGE (D&C) /HYSTEROSCOPY;  Surgeon: Stevenson Elbe, MD;  Location: [facility];  Service: Gynecology;  Laterality: N/A;   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   Dr Cherre Cornish FASCIA SURGERY  1994   Dr Abigail Abler   POLYPECTOMY  04/24/2011   Procedure: POLYPECTOMY;  Surgeon: Stevenson Elbe, MD;  Location: [facility];  Service: Gynecology;  Laterality: N/A;   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   Procedure: TOTAL KNEE ARTHROPLASTY;  Surgeon: Liliane Rei, MD;  Location: [facility];  Service: Orthopedics;  Laterality: Right;   TOTAL KNEE ARTHROPLASTY Left 10/07/2022   Procedure: TOTAL KNEE ARTHROPLASTY;  Surgeon: Liliane Rei, MD;  Location: [facility];  Service: Orthopedics;  Laterality: Left;    OB/GYN HISTORY:  OB History  Gravida Para Term Preterm AB Living  0 0 0 0 0 0  SAB IAB Ectopic Multiple Live Births  0 0 0 0     No LMP recorded. Patient is postmenopausal.  Age at menarche: 70  Age at menopause: 3 Hx of HRT: denies Hx of STDs: denies Last pap: 2021 History of abnormal pap smears: denies  SCREENING STUDIES:  Last mammogram: 2024  Last colonoscopy: 2022  MEDICATIONS: Outpatient Encounter Medications as of 11/21/2023  Medication Sig   Biotin 5000 MCG TABS Take 5,000 mcg by mouth every  evening.   Collagen-Vitamin C-Biotin (COLLAGEN 1500/C PO) Take 1 Scoop by mouth in the morning. With coffee   cycloSPORINE  (RESTASIS ) 0.05 % ophthalmic emulsion    diphenhydramine -acetaminophen  (TYLENOL  PM) 25-500 MG TABS tablet Take 1 tablet by mouth at bedtime as needed (pain/sleep).   levothyroxine  (SYNTHROID ) 125 MCG tablet Take 1 tablet (125 mcg total) by mouth daily before breakfast.   loperamide  (IMODIUM ) 2 MG capsule Take 1 capsule (2 mg total) by mouth 2 (two) times daily.   Melatonin 3 MG CAPS Take 3 mg by mouth at bedtime as needed (sleep).    metFORMIN  (GLUCOPHAGE ) 500 MG tablet Take 1 tablet (500 mg total) by mouth daily with breakfast.   Multiple Vitamins-Minerals (CENTRUM SILVER) tablet Take 1 tablet by mouth every evening.   Propylene Glycol (SYSTANE COMPLETE) 0.6 % SOLN Place 1 drop into both eyes as needed (dry eyes).   sertraline  (ZOLOFT ) 100 MG tablet 1 1/2 po every day   simvastatin  (ZOCOR ) 20 MG tablet TAKE 1 TABLET BY MOUTH AT  BEDTIME   Vitamin D , Ergocalciferol , (DRISDOL ) 1.25 MG (50000 UNIT) CAPS capsule Take 1 capsule (50,000 Units total) by mouth every 7 (seven) days.   [DISCONTINUED] Sod Fluoride-Potassium Nitrate (PREVIDENT 5000 ENAMEL PROTECT) 1.1-5 % GEL Place 1 Application onto teeth at bedtime. (Patient not taking: Reported on 11/19/2023)   No facility-administered encounter medications on file as of 11/21/2023.    ALLERGIES:  Allergies  Allergen Reactions   Tape Other (See Comments)    Red, swollen, itching     FAMILY HISTORY:  Family History  Problem Relation Age of Onset   Pancreatic cancer Mother 68   Cancer Mother 86       pancreatic   Colon cancer Father 19   Cancer Father 54       colon   Alcoholism Father    Cancer Sister 75       melanoma   Breast cancer Sister    Thyroid  disease Sister    Cancer Sister        melanoma   Breast cancer Sister    Cancer Brother 32       skin   Heart attack Maternal Grandmother 50   Heart disease Maternal Grandmother    Colon cancer Paternal Grandmother    Cancer Paternal Grandmother 74       colon   Breast cancer Maternal Aunt 30   Heart attack Maternal Aunt 60   Prostate cancer Neg Hx    Ovarian cancer Neg Hx    Endometrial cancer Neg Hx      SOCIAL HISTORY:  Social Connections: Unknown (09/01/2023)   Social Connection and Isolation Panel [NHANES]    Frequency of Communication with Friends and Family: Three times a week    Frequency of Social Gatherings with Friends and Family: Twice a week    Attends Religious Services: Patient declined     Database administrator or Organizations: No    Attends Engineer, structural: Not on file    Marital Status: Widowed    REVIEW OF SYSTEMS:  Denies appetite changes, fevers, chills, fatigue, unexplained weight changes. Denies hearing loss, neck lumps or masses, mouth sores, ringing in ears or voice changes. Denies cough or wheezing.  Denies shortness of breath. Denies chest pain or palpitations. Denies leg swelling. Denies abdominal distention, pain, blood in stools, constipation, diarrhea, nausea, vomiting, or early satiety. Denies pain with intercourse, dysuria, frequency, hematuria or incontinence. Denies hot flashes, pelvic pain, vaginal bleeding  or vaginal discharge.   Denies joint pain, back pain or muscle pain/cramps. Denies itching, rash, or wounds. Denies dizziness, headaches, numbness or seizures. Denies swollen lymph nodes or glands, denies easy bruising or bleeding. Denies anxiety, depression, confusion, or decreased concentration.  Physical Exam:  Vital Signs for this encounter:  Blood pressure 134/74, pulse 61, temperature 98.2 F (36.8 C), temperature source Oral, resp. rate 18, height 5' 0.51" (1.537 m), weight 200 lb 12.8 oz (91.1 kg), SpO2 98%. Body mass index is 38.56 kg/m. General: Alert, oriented, no acute distress.  HEENT: Normocephalic, atraumatic. Sclera anicteric.  Chest: Clear to auscultation bilaterally. No wheezes, rhonchi, or rales. Cardiovascular: Regular rate and rhythm, no murmurs, rubs, or gallops.  Abdomen: Obese. Normoactive bowel sounds. Soft, nondistended, nontender to palpation. No masses or hepatosplenomegaly appreciated. No palpable fluid wave.  Extremities: Grossly normal range of motion. Warm, well perfused. No edema bilaterally.  Skin: No rashes or lesions.  Lymphatics: No cervical, supraclavicular, or inguinal adenopathy.  GU:  Normal external female genitalia. No lesions. No discharge or bleeding.             Bladder/urethra:  No  lesions or masses, well supported bladder             Vagina: Moderately atrophic, no lesions.             Cervix: Normal appearing, no lesions.  Atrophic.             Uterus: Small, mobile, no parametrial involvement or nodularity.             Adnexa: No masses appreciated.  Rectal: Deferred.  LABORATORY AND RADIOLOGIC DATA:  Outside medical records were reviewed to synthesize the above history, along with the history and physical obtained during the visit.   Lab Results  Component Value Date   WBC 6.1 09/02/2023   HGB 14.3 09/02/2023   HCT 42.9 09/02/2023   PLT 177 09/02/2023   GLUCOSE 90 09/02/2023   CHOL 181 09/02/2023   TRIG 86 09/02/2023   HDL 86 09/02/2023   LDLDIRECT 129.1 06/13/2011   LDLCALC 78 09/02/2023   ALT 15 09/02/2023   AST 21 09/02/2023   NA 139 09/02/2023   K 4.3 09/02/2023   CL 101 09/02/2023   CREATININE 0.59 (L) 09/02/2023   BUN 28 (H) 09/02/2023   CO2 29 09/02/2023   TSH 2.99 09/02/2023   HGBA1C 5.5 09/02/2023   MICROALBUR 0.3 09/02/2023

## 2023-11-21 ENCOUNTER — Inpatient Hospital Stay (HOSPITAL_BASED_OUTPATIENT_CLINIC_OR_DEPARTMENT_OTHER): Admitting: Gynecologic Oncology

## 2023-11-21 ENCOUNTER — Inpatient Hospital Stay: Attending: Gynecologic Oncology | Admitting: Gynecologic Oncology

## 2023-11-21 ENCOUNTER — Encounter: Payer: Self-pay | Admitting: Gynecologic Oncology

## 2023-11-21 ENCOUNTER — Telehealth: Payer: Self-pay | Admitting: Oncology

## 2023-11-21 VITALS — BP 134/74 | HR 61 | Temp 98.2°F | Resp 18 | Ht 60.51 in | Wt 200.8 lb

## 2023-11-21 DIAGNOSIS — Z803 Family history of malignant neoplasm of breast: Secondary | ICD-10-CM | POA: Diagnosis not present

## 2023-11-21 DIAGNOSIS — Z79890 Hormone replacement therapy (postmenopausal): Secondary | ICD-10-CM | POA: Insufficient documentation

## 2023-11-21 DIAGNOSIS — M1990 Unspecified osteoarthritis, unspecified site: Secondary | ICD-10-CM | POA: Diagnosis not present

## 2023-11-21 DIAGNOSIS — I10 Essential (primary) hypertension: Secondary | ICD-10-CM | POA: Insufficient documentation

## 2023-11-21 DIAGNOSIS — F419 Anxiety disorder, unspecified: Secondary | ICD-10-CM | POA: Diagnosis not present

## 2023-11-21 DIAGNOSIS — E785 Hyperlipidemia, unspecified: Secondary | ICD-10-CM | POA: Insufficient documentation

## 2023-11-21 DIAGNOSIS — C541 Malignant neoplasm of endometrium: Secondary | ICD-10-CM | POA: Diagnosis present

## 2023-11-21 DIAGNOSIS — E669 Obesity, unspecified: Secondary | ICD-10-CM | POA: Insufficient documentation

## 2023-11-21 DIAGNOSIS — J309 Allergic rhinitis, unspecified: Secondary | ICD-10-CM | POA: Insufficient documentation

## 2023-11-21 DIAGNOSIS — K589 Irritable bowel syndrome without diarrhea: Secondary | ICD-10-CM | POA: Insufficient documentation

## 2023-11-21 DIAGNOSIS — F32A Depression, unspecified: Secondary | ICD-10-CM | POA: Insufficient documentation

## 2023-11-21 DIAGNOSIS — Z79899 Other long term (current) drug therapy: Secondary | ICD-10-CM | POA: Diagnosis not present

## 2023-11-21 DIAGNOSIS — Z800 Family history of malignant neoplasm of digestive organs: Secondary | ICD-10-CM | POA: Diagnosis not present

## 2023-11-21 DIAGNOSIS — Z808 Family history of malignant neoplasm of other organs or systems: Secondary | ICD-10-CM | POA: Insufficient documentation

## 2023-11-21 DIAGNOSIS — E039 Hypothyroidism, unspecified: Secondary | ICD-10-CM | POA: Insufficient documentation

## 2023-11-21 DIAGNOSIS — Z7985 Long-term (current) use of injectable non-insulin antidiabetic drugs: Secondary | ICD-10-CM | POA: Insufficient documentation

## 2023-11-21 DIAGNOSIS — G4730 Sleep apnea, unspecified: Secondary | ICD-10-CM | POA: Insufficient documentation

## 2023-11-21 DIAGNOSIS — E119 Type 2 diabetes mellitus without complications: Secondary | ICD-10-CM | POA: Diagnosis not present

## 2023-11-21 DIAGNOSIS — N950 Postmenopausal bleeding: Secondary | ICD-10-CM | POA: Diagnosis not present

## 2023-11-21 DIAGNOSIS — J439 Emphysema, unspecified: Secondary | ICD-10-CM | POA: Insufficient documentation

## 2023-11-21 DIAGNOSIS — Z6838 Body mass index (BMI) 38.0-38.9, adult: Secondary | ICD-10-CM | POA: Diagnosis not present

## 2023-11-21 MED ORDER — SENNOSIDES-DOCUSATE SODIUM 8.6-50 MG PO TABS: 2.0000 | ORAL_TABLET | Freq: Every day | ORAL | 0 refills | Status: DC

## 2023-11-21 MED ORDER — OXYCODONE HCL 5 MG PO TABS: 5.0000 mg | ORAL_TABLET | ORAL | 0 refills | Status: DC | PRN

## 2023-11-21 NOTE — Telephone Encounter (Signed)
 Zea called regarding her genetic counseling appointment.  Advised her it is scheduled on 01/19/24 at 10:00.  She verbalized understanding and agreement.

## 2023-11-21 NOTE — Patient Instructions (Signed)
 Preparing for your Surgery  Plan for surgery on Dec 03, 2023 with Dr. Wiley Hanger at [facility]. You will be scheduled for a robotic assisted total laparoscopic hysterectomy (removal of uterus and cervix), bilateral salpingo-oophorectomy (removal of both fallopian tubes and ovaries), sentinel lymph node biopsy, possible lymph node dissection, possible laparotomy (larger incision on your abdomen if needed).   Pre-operative Testing -You will receive a phone call from presurgical testing at [facility] to arrange for a pre-operative appointment and lab work.  -Bring your insurance card, copy of an advanced directive if applicable, medication list  -At that visit, you will be asked to sign a consent for a possible blood transfusion in case a transfusion becomes necessary during surgery.  The need for a blood transfusion is rare but having consent is a necessary part of your care.     -You should not be taking blood thinners or aspirin  at least ten days prior to surgery unless instructed by your surgeon.  -Do not take supplements such as fish oil (omega 3), red yeast rice, turmeric before your surgery. STOP TAKING AT LEAST 10 DAYS BEFORE SURGERY. You want to avoid medications with aspirin  in them including headache powders such as BC or Goody's), Excedrin migraine.  Day Before Surgery at Home -You will be asked to take in a light diet the day before surgery. You will be advised you can have clear liquids up until 3 hours before your surgery.    Eat a light diet the day before surgery.  Examples including soups, broths, toast, yogurt, mashed potatoes.  AVOID GAS PRODUCING FOODS AND BEVERAGES. Things to avoid include carbonated beverages (fizzy beverages, sodas), raw fruits and raw vegetables (uncooked), or beans.   If your bowels are filled with gas, your surgeon will have difficulty visualizing your pelvic organs which increases your surgical risks.  Your role in  recovery Your role is to become active as soon as directed by your doctor, while still giving yourself time to heal.  Rest when you feel tired. You will be asked to do the following in order to speed your recovery:  - Cough and breathe deeply. This helps to clear and expand your lungs and can prevent pneumonia after surgery.  - STAY ACTIVE WHEN YOU GET HOME. Do mild physical activity. Walking or moving your legs help your circulation and body functions return to normal. Do not try to get up or walk alone the first time after surgery.   -If you develop swelling on one leg or the other, pain in the back of your leg, redness/warmth in one of your legs, please call the office or go to the Emergency Room to have a doppler to rule out a blood clot. For shortness of breath, chest pain-seek care in the Emergency Room as soon as possible. - Actively manage your pain. Managing your pain lets you move in comfort. We will ask you to rate your pain on a scale of zero to 10. It is your responsibility to tell your doctor or nurse where and how much you hurt so your pain can be treated.  Special Considerations -If you are diabetic, you may be placed on insulin  after surgery to have closer control over your blood sugars to promote healing and recovery.  This does not mean that you will be discharged on insulin .  If applicable, your oral antidiabetics will be resumed when you are tolerating a solid diet.  -Your final pathology results from surgery should be  available around one week after surgery and the results will be relayed to you when available.  Vira Grieves, NP will be assisting your GYN Oncologist with surgery.  If you end up staying the night, the next day after your surgery you will either see Dr. Orvil Bland, Dr. Daisey Dryer, or Dr. Abdul Hodgkin.  -FMLA forms can be faxed to [phone] and please allow 5-7 business days for completion.  Pain Management After Surgery -You will be prescribed your pain  medication and bowel regimen medications before surgery so that you can have these available when you are discharged from the hospital. The pain medication is for use ONLY AFTER surgery and a new prescription will not be given.   -Make sure that you have Tylenol  and Ibuprofen  IF YOU ARE ABLE TO TAKE THESE MEDICATIONS at home to use on a regular basis after surgery for pain control. We recommend alternating the medications every hour to six hours since they work differently and are processed in the body differently for pain relief.  -Review the attached handout on narcotic use and their risks and side effects.   Bowel Regimen -You will be prescribed Sennakot-S to take nightly to prevent constipation especially if you are taking the narcotic pain medication intermittently.  It is important to prevent constipation and drink adequate amounts of liquids. You can stop taking this medication when you are not taking pain medication and you are back on your normal bowel routine.  Risks of Surgery Risks of surgery are low but include bleeding, infection, damage to surrounding structures, re-operation, blood clots, and very rarely death.   Blood Transfusion Information (For the consent to be signed before surgery)  We will be checking your blood type before surgery so in case of emergencies, we will know what type of blood you would need.                                            WHAT IS A BLOOD TRANSFUSION?  A transfusion is the replacement of blood or some of its parts. Blood is made up of multiple cells which provide different functions. Red blood cells carry oxygen and are used for blood loss replacement. White blood cells fight against infection. Platelets control bleeding. Plasma helps clot blood. Other blood products are available for specialized needs, such as hemophilia or other clotting disorders. BEFORE THE TRANSFUSION  Who gives blood for transfusions?  You may be able to donate blood to  be used at a later date on yourself (autologous donation). Relatives can be asked to donate blood. This is generally not any safer than if you have received blood from a stranger. The same precautions are taken to ensure safety when a relative's blood is donated. Healthy volunteers who are fully evaluated to make sure their blood is safe. This is blood bank blood. Transfusion therapy is the safest it has ever been in the practice of medicine. Before blood is taken from a donor, a complete history is taken to make sure that person has no history of diseases nor engages in risky social behavior (examples are intravenous drug use or sexual activity with multiple partners). The donor's travel history is screened to minimize risk of transmitting infections, such as malaria. The donated blood is tested for signs of infectious diseases, such as HIV and hepatitis. The blood is then tested to be sure it is compatible  with you in order to minimize the chance of a transfusion reaction. If you or a relative donates blood, this is often done in anticipation of surgery and is not appropriate for emergency situations. It takes many days to process the donated blood. RISKS AND COMPLICATIONS Although transfusion therapy is very safe and saves many lives, the main dangers of transfusion include:  Getting an infectious disease. Developing a transfusion reaction. This is an allergic reaction to something in the blood you were given. Every precaution is taken to prevent this. The decision to have a blood transfusion has been considered carefully by your caregiver before blood is given. Blood is not given unless the benefits outweigh the risks.  AFTER SURGERY INSTRUCTIONS  Return to work: 4-6 weeks if applicable  Activity: 1. Be up and out of the bed during the day.  Take a nap if needed.  You may walk up steps but be careful and use the hand rail.  Stair climbing will tire you more than you think, you may need to stop  part way and rest.   2. No lifting or straining for 6 weeks over 10 pounds. No pushing, pulling, straining for 6 weeks.  3. No driving for 1-61 days when the following criteria have been met: Do not drive if you are taking narcotic pain medicine and make sure that your reaction time has returned.   4. You can shower as soon as the next day after surgery. Shower daily.  Use your regular soap and water  (not directly on the incision) and pat your incision(s) dry afterwards; don't rub.  No tub baths or submerging your body in water  until cleared by your surgeon. If you have the soap that was given to you by pre-surgical testing that was used before surgery, you do not need to use it afterwards because this can irritate your incisions.   5. No sexual activity and nothing in the vagina for 12 weeks.  6. You may experience a small amount of clear drainage from your incisions, which is normal.  If the drainage persists, increases, or changes color please call the office.  7. Do not use creams, lotions, or ointments such as neosporin on your incisions after surgery until advised by your surgeon because they can cause removal of the dermabond glue on your incisions.    8. You may experience vaginal spotting after surgery or when the stitches at the top of the vagina begin to dissolve.  The spotting is normal but if you experience heavy bleeding, call our office.  9. Take Tylenol  or ibuprofen  first for pain if you are able to take these medications and only use narcotic pain medication for severe pain not relieved by the Tylenol  or Ibuprofen .  Monitor your Tylenol  intake to a max of 4,000 mg in a 24 hour period. You can alternate these medications after surgery.  Diet: 1. Low sodium Heart Healthy Diet is recommended but you are cleared to resume your normal (before surgery) diet after your procedure.  2. It is safe to use a laxative, such as Miralax  or Colace, if you have difficulty moving your bowels  before surgery. You have been prescribed Sennakot-S to take at bedtime every evening after surgery to keep bowel movements regular and to prevent constipation.    Wound Care: 1. Keep clean and dry.  Shower daily.  Reasons to call the Doctor: Fever - Oral temperature greater than 100.4 degrees Fahrenheit Foul-smelling vaginal discharge Difficulty urinating Nausea and vomiting Increased pain  at the site of the incision that is unrelieved with pain medicine. Difficulty breathing with or without chest pain New calf pain especially if only on one side Sudden, continuing increased vaginal bleeding with or without clots.   Contacts: For questions or concerns you should contact:  Dr. Wiley Hanger at [phone]  Vira Grieves, NP at [phone]  After Hours: call [phone] and have the GYN Oncologist paged/contacted (after 5 pm or on the weekends). You will speak with an after hours RN and let he or she know you have had surgery.  Messages sent via mychart are for non-urgent matters and are not responded to after hours so for urgent needs, please call the after hours number.

## 2023-11-21 NOTE — Progress Notes (Signed)
 Patient here for a consult with Dr. Orvil Bland for a pre-operative appointment prior to her scheduled surgery on 12/03/2023. She is scheduled for a robotic assisted total laparoscopic hysterectomy, bilateral salpingo-oophorectomy, sentinel lymph node biopsy, possible lymph node dissection, possible laparotomy. The surgery was discussed in detail.  See after visit summary for additional details.      Discussed post-op pain management in detail including the aspects of the enhanced recovery pathway.  Advised her that a new prescription would be sent in and it is only to be used for after her upcoming surgery.  We discussed the use of tylenol  post-op and to monitor for a maximum of 4,000 mg in a 24 hour period.  Also prescribed sennakot to be used after surgery and to hold if having loose stools.  Discussed bowel regimen in detail.     Discussed the use of SCDs and measures to take at home to prevent DVT including frequent mobility.  Reportable signs and symptoms of DVT discussed. Post-operative instructions discussed and expectations for after surgery. Incisional care discussed as well including reportable signs and symptoms including erythema, drainage, wound separation.     30 minutes spent with the patient.  Verbalizing understanding of material discussed. No needs or concerns voiced at the end of the visit.   Advised patient to call for any needs.  Advised that her post-operative medications had been prescribed and could be picked up at any time.    This appointment is included in the global surgical bundle as pre-operative teaching and has no charge.

## 2023-11-26 ENCOUNTER — Encounter: Payer: Self-pay | Admitting: Family Medicine

## 2023-11-26 ENCOUNTER — Encounter: Payer: Self-pay | Admitting: Obstetrics and Gynecology

## 2023-11-26 NOTE — Patient Instructions (Signed)
 SURGICAL WAITING ROOM VISITATION  Patients having surgery or a procedure may have no more than 2 support people in the waiting area - these visitors may rotate.    Children under the age of 35 must have an adult with them who is not the patient.  Due to an increase in RSV and influenza rates and associated hospitalizations, children ages 74 and under may not visit patients in [facility] hospitals.  Visitors with respiratory illnesses are discouraged from visiting and should remain at home.  If the patient needs to stay at the hospital during part of their recovery, the visitor guidelines for inpatient rooms apply. Pre-op nurse will coordinate an appropriate time for 1 support person to accompany patient in pre-op.  This support person may not rotate.    Please refer to the [facility] website for the visitor guidelines for Inpatients (after your surgery is over and you are in a regular room).    Your procedure is scheduled on: 12/03/23   Report to [facility] Main Entrance    Report to admitting at 6:15 AM   Call this number if you have problems the morning of surgery [phone]   Do not eat food :After Midnight.   After Midnight you may have the following liquids until 5:30 AM DAY OF SURGERY  Water  Non-Citrus Juices (without pulp, NO RED-Apple, White grape, White cranberry) Black Coffee (NO MILK/CREAM OR CREAMERS, sugar ok)  Clear Tea (NO MILK/CREAM OR CREAMERS, sugar ok) regular and decaf                             Plain Jell-O (NO RED)                                           Fruit ices (not with fruit pulp, NO RED)                                     Popsicles (NO RED)                                                               Sports drinks like Gatorade (NO RED)          If you have questions, please contact your surgeon's office.   FOLLOW BOWEL PREP AND ANY ADDITIONAL PRE OP INSTRUCTIONS YOU RECEIVED FROM YOUR SURGEON'S OFFICE!!!     Oral Hygiene is also  important to reduce your risk of infection.                                    Remember - BRUSH YOUR TEETH THE MORNING OF SURGERY WITH YOUR REGULAR TOOTHPASTE  DENTURES WILL BE REMOVED PRIOR TO SURGERY PLEASE DO NOT APPLY "Poly grip" OR ADHESIVES!!!   Stop all vitamins and herbal supplements 7 days before surgery.   Take these medicines the morning of surgery with A SIP OF WATER : Tylenol , Levothyroxine , Sertraline   DO NOT TAKE ANY ORAL DIABETIC MEDICATIONS DAY OF  YOUR SURGERY  How to Manage Your Diabetes Before and After Surgery  Why is it important to control my blood sugar before and after surgery? Improving blood sugar levels before and after surgery helps healing and can limit problems. A way of improving blood sugar control is eating a healthy diet by:  Eating less sugar and carbohydrates  Increasing activity/exercise  Talking with your doctor about reaching your blood sugar goals High blood sugars (greater than 180 mg/dL) can raise your risk of infections and slow your recovery, so you will need to focus on controlling your diabetes during the weeks before surgery. Make sure that the doctor who takes care of your diabetes knows about your planned surgery including the date and location.  How do I manage my blood sugar before surgery? Check your blood sugar at least 4 times a day, starting 2 days before surgery, to make sure that the level is not too high or low. Check your blood sugar the morning of your surgery when you wake up and every 2 hours until you get to the Short Stay unit. If your blood sugar is less than 70 mg/dL, you will need to treat for low blood sugar: Do not take insulin . Treat a low blood sugar (less than 70 mg/dL) with  cup of clear juice (cranberry or apple), 4 glucose tablets, OR glucose gel. Recheck blood sugar in 15 minutes after treatment (to make sure it is greater than 70 mg/dL). If your blood sugar is not greater than 70 mg/dL on recheck, call  [phone] for further instructions. Report your blood sugar to the short stay nurse when you get to Short Stay.  If you are admitted to the hospital after surgery: Your blood sugar will be checked by the staff and you will probably be given insulin  after surgery (instead of oral diabetes medicines) to make sure you have good blood sugar levels. The goal for blood sugar control after surgery is 80-180 mg/dL.   WHAT DO I DO ABOUT MY DIABETES MEDICATION?  Do not take oral diabetes medicines (pills) the morning of surgery.  THE DAY BEFORE SURGERY, take Metformin  as prescribed.     THE MORNING OF SURGERY, do not take Metformin .    Reviewed and Endorsed by [facility] Patient Education Committee, August 2015                              You may not have any metal on your body including hair pins, jewelry, and body piercing             Do not wear make-up, lotions, powders, perfumes, or deodorant  Do not wear nail polish including gel and S&S, artificial/acrylic nails, or any other type of covering on natural nails including finger and toenails. If you have artificial nails, gel coating, etc. that needs to be removed by a nail salon please have this removed prior to surgery or surgery may need to be canceled/ delayed if the surgeon/ anesthesia feels like they are unable to be safely monitored.   Do not shave  48 hours prior to surgery.    Do not bring valuables to the hospital. [facility] IS NOT             RESPONSIBLE   FOR VALUABLES.   Contacts, glasses, dentures or bridgework may not be worn into surgery.  DO NOT BRING YOUR HOME MEDICATIONS TO THE HOSPITAL. PHARMACY WILL DISPENSE MEDICATIONS LISTED  ON YOUR MEDICATION LIST TO YOU DURING YOUR ADMISSION IN THE HOSPITAL!    Patients discharged on the day of surgery will not be allowed to drive home.  Someone NEEDS to stay with you for the first 24 hours after anesthesia.              Please read over the following fact sheets you were  given: IF YOU HAVE QUESTIONS ABOUT YOUR PRE-OP INSTRUCTIONS PLEASE CALL [phone] Party   If you received a COVID test during your pre-op visit  it is requested that you wear a mask when out in public, stay away from anyone that may not be feeling well and notify your surgeon if you develop symptoms. If you test positive for Covid or have been in contact with anyone that has tested positive in the last 10 days please notify you surgeon.    [facility] - Preparing for Surgery Before surgery, you can play an important role.  Because skin is not sterile, your skin needs to be as free of germs as possible.  You can reduce the number of germs on your skin by washing with CHG (chlorahexidine gluconate) soap before surgery.  CHG is an antiseptic cleaner which kills germs and bonds with the skin to continue killing germs even after washing. Please DO NOT use if you have an allergy to CHG or antibacterial soaps.  If your skin becomes reddened/irritated stop using the CHG and inform your nurse when you arrive at Short Stay. Do not shave (including legs and underarms) for at least 48 hours prior to the first CHG shower.  You may shave your face/neck.  Please follow these instructions carefully:  1.  Shower with CHG Soap the night before surgery and the  morning of surgery.  2.  If you choose to wash your hair, wash your hair first as usual with your normal  shampoo.  3.  After you shampoo, rinse your hair and body thoroughly to remove the shampoo.                             4.  Use CHG as you would any other liquid soap.  You can apply chg directly to the skin and wash.  Gently with a scrungie or clean washcloth.  5.  Apply the CHG Soap to your body ONLY FROM THE NECK DOWN.   Do   not use on face/ open                           Wound or open sores. Avoid contact with eyes, ears mouth and   genitals (private parts).                       Wash face,  Genitals (private parts) with your normal soap.              6.  Wash thoroughly, paying special attention to the area where your    surgery  will be performed.  7.  Thoroughly rinse your body with warm water  from the neck down.  8.  DO NOT shower/wash with your normal soap after using and rinsing off the CHG Soap.                9.  Pat yourself dry with a clean towel.            10.  Wear clean pajamas.            11.  Place clean sheets on your bed the night of your first shower and do not  sleep with pets. Day of Surgery : Do not apply any lotions/deodorants the morning of surgery.  Please wear clean clothes to the hospital/surgery center.  FAILURE TO FOLLOW THESE INSTRUCTIONS MAY RESULT IN THE CANCELLATION OF YOUR SURGERY  PATIENT SIGNATURE_________________________________  NURSE SIGNATURE__________________________________  ________________________________________________________________________ WHAT IS A BLOOD TRANSFUSION? Blood Transfusion Information  A transfusion is the replacement of blood or some of its parts. Blood is made up of multiple cells which provide different functions. Red blood cells carry oxygen and are used for blood loss replacement. White blood cells fight against infection. Platelets control bleeding. Plasma helps clot blood. Other blood products are available for specialized needs, such as hemophilia or other clotting disorders. BEFORE THE TRANSFUSION  Who gives blood for transfusions?  Healthy volunteers who are fully evaluated to make sure their blood is safe. This is blood bank blood. Transfusion therapy is the safest it has ever been in the practice of medicine. Before blood is taken from a donor, a complete history is taken to make sure that person has no history of diseases nor engages in risky social behavior (examples are intravenous drug use or sexual activity with multiple partners). The donor's travel history is screened to minimize risk of transmitting infections, such as malaria. The donated blood is  tested for signs of infectious diseases, such as HIV and hepatitis. The blood is then tested to be sure it is compatible with you in order to minimize the chance of a transfusion reaction. If you or a relative donates blood, this is often done in anticipation of surgery and is not appropriate for emergency situations. It takes many days to process the donated blood. RISKS AND COMPLICATIONS Although transfusion therapy is very safe and saves many lives, the main dangers of transfusion include:  Getting an infectious disease. Developing a transfusion reaction. This is an allergic reaction to something in the blood you were given. Every precaution is taken to prevent this. The decision to have a blood transfusion has been considered carefully by your caregiver before blood is given. Blood is not given unless the benefits outweigh the risks. AFTER THE TRANSFUSION Right after receiving a blood transfusion, you will usually feel much better and more energetic. This is especially true if your red blood cells have gotten low (anemic). The transfusion raises the level of the red blood cells which carry oxygen, and this usually causes an energy increase. The nurse administering the transfusion will monitor you carefully for complications. HOME CARE INSTRUCTIONS  No special instructions are needed after a transfusion. You may find your energy is better. Speak with your caregiver about any limitations on activity for underlying diseases you may have. SEEK MEDICAL CARE IF:  Your condition is not improving after your transfusion. You develop redness or irritation at the intravenous (IV) site. SEEK IMMEDIATE MEDICAL CARE IF:  Any of the following symptoms occur over the next 12 hours: Shaking chills. You have a temperature by mouth above 102 F (38.9 C), not controlled by medicine. Chest, back, or muscle pain. People around you feel you are not acting correctly or are confused. Shortness of breath or  difficulty breathing. Dizziness and fainting. You get a rash or develop hives. You have a decrease in urine output. Your urine turns a dark color or changes to pink, red, or brown.  Any of the following symptoms occur over the next 10 days: You have a temperature by mouth above 102 F (38.9 C), not controlled by medicine. Shortness of breath. Weakness after normal activity. The white part of the eye turns yellow (jaundice). You have a decrease in the amount of urine or are urinating less often. Your urine turns a dark color or changes to pink, red, or brown. Document Released: 06/28/2000 Document Revised: 09/23/2011 Document Reviewed: 02/15/2008 [facility] Patient Information 2014 [place], [place].  _______________________________________________________________________

## 2023-11-26 NOTE — Progress Notes (Signed)
 COVID Vaccine Completed: yes  Date of COVID positive in last 90 days:  PCP - Kelsie Patrick Chase, DO LOV 09/02/23 Cardiologist - n/a  CT- 02/27/23 Epic Chest x-ray - n/a EKG - 09/02/23 Epic Stress Test - years ago per pt ECHO - 05/25/21 Epic Cardiac Cath - n/a Pacemaker/ICD device last checked: n/a Spinal Cord Stimulator: n/a  Bowel Prep - no  Sleep Study - yes, borderline CPAP - no per pt preference   Fasting Blood Sugar -  Checks Blood Sugar  no checks at home  Last dose of GLP1 agonist-  N/A GLP1 instructions:  Hold 7 days before surgery    Last dose of SGLT-2 inhibitors-  N/A SGLT-2 instructions:  Hold 3 days before surgery    Blood Thinner Instructions:  Last dose: n/a Time: Aspirin  Instructions: Last Dose:  Activity level: Can go up a flight of stairs and perform activities of daily living without stopping and without symptoms of chest pain or shortness of breath.    Anesthesia review: heart murmur, OSA, HTN, DM2  Patient denies shortness of breath, fever, cough and chest pain at PAT appointment  Patient verbalized understanding of instructions that were given to them at the PAT appointment. Patient was also instructed that they will need to review over the PAT instructions again at home before surgery.

## 2023-11-27 ENCOUNTER — Other Ambulatory Visit: Payer: Self-pay | Admitting: Family Medicine

## 2023-11-27 ENCOUNTER — Inpatient Hospital Stay: Payer: Self-pay | Admitting: Licensed Clinical Social Worker

## 2023-11-27 ENCOUNTER — Encounter: Payer: Self-pay | Admitting: Family Medicine

## 2023-11-27 DIAGNOSIS — E039 Hypothyroidism, unspecified: Secondary | ICD-10-CM

## 2023-11-27 DIAGNOSIS — E785 Hyperlipidemia, unspecified: Secondary | ICD-10-CM

## 2023-11-27 NOTE — Progress Notes (Signed)
 [facility] Clinical Social Work  Initial Assessment   [name] [name] is a 78 y.o. year old female contacted by phone. Clinical Social Work was referred by new patient protocol for assessment of psychosocial needs.   SDOH (Social Determinants of Health) assessments performed: Reviewed SDOH Interventions    Flowsheet Row Office Visit from 09/02/2023 in [facility]  SDOH Interventions   Depression Interventions/Treatment  Medication, Currently on Treatment       SDOH Screenings   Food Insecurity: No Food Insecurity (09/01/2023)  Housing: Low Risk  (09/01/2023)  Transportation Needs: No Transportation Needs (09/01/2023)  Utilities: Not At Risk (10/07/2022)  Alcohol  Screen: Low Risk  (09/01/2023)  Depression (PHQ2-9): Medium Risk (09/02/2023)  Financial Resource Strain: Low Risk  (09/01/2023)  Physical Activity: Insufficiently Active (09/01/2023)  Social Connections: Unknown (09/01/2023)  Stress: Stress Concern Present (09/01/2023)  Tobacco Use: Medium Risk (11/21/2023)     Distress Screen completed: No     No data to display            Family/Social Information:  Housing Arrangement: patient lives alone. She is widowed- husband died in 05/08/2024Family members/support persons in your life? Family and Friends Transportation concerns: no  Employment: Retired .  Income source: Actor concerns: No Type of concern: None Food access concerns: no Advanced directives: Not known Services Currently in place:  Medicare & Tricare  Coping/ Adjustment to diagnosis: Patient understands treatment plan and what happens next? yes, has surgery scheduled 5/21. Pt is pretty positive about it but starting to feel nervous about what the pathology might say post-surgery. She shared about how the last year has been difficult with wanting to stay strong to show her husband that she can figure out what she needs to do (he did many things  for them) and make him proud. She has a history of depression and expected she would break down after the year, but hasn't. She wants to make sure that he knows and others can tell how much she loved him Concerns about diagnosis and/or treatment: worried about path results if it might be worse than expected Patient reported stressors: Depression, Adjusting to my illness, and grief Current coping skills/ strengths: Ability for insight , Capable of independent living , Communication skills , and Motivation for treatment/growth     SUMMARY: Current SDOH Barriers:  No major barriers identified today  Clinical Social Work Clinical Goal(s):  No clinical social work goals at this time  Interventions: Discussed common feeling and emotions when being diagnosed with cancer, and the importance of support during treatment Informed patient of the support team roles and support services at [facility] Provided CSW contact information and encouraged patient to call with any questions or concerns   Follow Up Plan: Patient will contact CSW with any support or resource needs Patient verbalizes understanding of plan: Yes    Madalynn Pickelsimer E Datha Kissinger, LCSW Clinical Social Worker [facility]

## 2023-11-28 ENCOUNTER — Encounter (HOSPITAL_COMMUNITY)
Admission: RE | Admit: 2023-11-28 | Discharge: 2023-11-28 | Disposition: A | Discharge status: Home / Self Care | Source: Ambulatory Visit | Attending: Gynecologic Oncology | Admitting: Gynecologic Oncology

## 2023-11-28 ENCOUNTER — Encounter (HOSPITAL_COMMUNITY): Payer: Self-pay

## 2023-11-28 ENCOUNTER — Other Ambulatory Visit: Payer: Self-pay

## 2023-11-28 VITALS — BP 143/66 | HR 63 | Temp 98.6°F | Resp 16 | Ht 60.0 in | Wt 199.0 lb

## 2023-11-28 DIAGNOSIS — Z01812 Encounter for preprocedural laboratory examination: Secondary | ICD-10-CM | POA: Diagnosis present

## 2023-11-28 DIAGNOSIS — E119 Type 2 diabetes mellitus without complications: Secondary | ICD-10-CM | POA: Diagnosis not present

## 2023-11-28 DIAGNOSIS — C541 Malignant neoplasm of endometrium: Secondary | ICD-10-CM | POA: Insufficient documentation

## 2023-11-28 DIAGNOSIS — E039 Hypothyroidism, unspecified: Secondary | ICD-10-CM | POA: Diagnosis not present

## 2023-11-28 DIAGNOSIS — Z7984 Long term (current) use of oral hypoglycemic drugs: Secondary | ICD-10-CM | POA: Diagnosis not present

## 2023-11-28 DIAGNOSIS — J449 Chronic obstructive pulmonary disease, unspecified: Secondary | ICD-10-CM | POA: Diagnosis not present

## 2023-11-28 DIAGNOSIS — Z87891 Personal history of nicotine dependence: Secondary | ICD-10-CM | POA: Diagnosis not present

## 2023-11-28 DIAGNOSIS — I340 Nonrheumatic mitral (valve) insufficiency: Secondary | ICD-10-CM | POA: Diagnosis not present

## 2023-11-28 DIAGNOSIS — I110 Hypertensive heart disease with heart failure: Secondary | ICD-10-CM | POA: Insufficient documentation

## 2023-11-28 DIAGNOSIS — I5030 Unspecified diastolic (congestive) heart failure: Secondary | ICD-10-CM | POA: Diagnosis not present

## 2023-11-28 DIAGNOSIS — G4733 Obstructive sleep apnea (adult) (pediatric): Secondary | ICD-10-CM | POA: Diagnosis not present

## 2023-11-28 LAB — COMPREHENSIVE METABOLIC PANEL WITH GFR
ALT: 14 U/L (ref 0–44)
AST: 25 U/L (ref 15–41)
Albumin: 4.2 g/dL (ref 3.5–5.0)
Alkaline Phosphatase: 69 U/L (ref 38–126)
Anion gap: 9 (ref 5–15)
BUN: 20 mg/dL (ref 8–23)
CO2: 25 mmol/L (ref 22–32)
Calcium: 9.1 mg/dL (ref 8.9–10.3)
Chloride: 103 mmol/L (ref 98–111)
Creatinine, Ser: 0.63 mg/dL (ref 0.44–1.00)
GFR, Estimated: >60 mL/min (ref >60)
Glucose, Bld: 97 mg/dL (ref 70–99)
Potassium: 3.9 mmol/L (ref 3.5–5.1)
Sodium: 137 mmol/L (ref 135–145)
Total Bilirubin: 0.7 mg/dL (ref 0.0–1.2)
Total Protein: 6.5 g/dL (ref 6.5–8.1)

## 2023-11-28 LAB — CBC
HCT: 42.3 % (ref 36.0–46.0)
Hemoglobin: 13.7 g/dL (ref 12.0–15.0)
MCH: 30.7 pg (ref 26.0–34.0)
MCHC: 32.4 g/dL (ref 30.0–36.0)
MCV: 94.8 fL (ref 80.0–100.0)
Platelets: 155 10*3/uL (ref 150–400)
RBC: 4.46 MIL/uL (ref 3.87–5.11)
RDW: 13.4 % (ref 11.5–15.5)
WBC: 4.3 10*3/uL (ref 4.0–10.5)
nRBC: 0 % (ref 0.0–0.2)

## 2023-11-28 LAB — HEMOGLOBIN A1C
Hgb A1c MFr Bld: 5.1 % (ref 4.8–5.6)
Mean Plasma Glucose: 99.67 mg/dL

## 2023-11-28 LAB — GLUCOSE, CAPILLARY: Glucose-Capillary: 99 mg/dL (ref 70–99)

## 2023-12-01 ENCOUNTER — Encounter (HOSPITAL_COMMUNITY): Payer: Self-pay

## 2023-12-01 ENCOUNTER — Other Ambulatory Visit: Payer: Self-pay | Admitting: Family Medicine

## 2023-12-01 NOTE — Progress Notes (Signed)
 Case: [id number] Date/Time: 12/03/23 1015   Procedures:      HYSTERECTOMY, TOTAL, ROBOT-ASSISTED, LAPAROSCOPIC, WITH BILATERAL SALPINGO-OOPHORECTOMY (Bilateral)     INJECTION, FOR SENTINEL LYMPH NODE IDENTIFICATION     LYMPH NODE BIOPSY - POSSIBLE PELVIC LYMPHADENECTOMY, POSSIBLE LAPAROTOMY   Anesthesia type: General   Pre-op diagnosis: ENDOMETIAL CANCER   Location: [facility] ROOM 05 / [facility]   Surgeons: Suzi Essex, MD       DISCUSSION: [name] [name] is a 78 yo who presents to PAT prior to surgery above. PMH of former smoking, HTN, HFpEF, mild mitral regurgitation, COPD, OSA (no CPAP use), hypothyroid, depression, arthritis.  Pt follows with PCP. Last seen in 09/02/23 and she reported vaginal spotting and is now scheduled for surgery above. All other issues stable. Echo in 2022 consistent with HFpEF and mild MR.   VS: BP (!) 143/66   Pulse 63   Temp 37 C (Oral)   Resp 16   Ht 5' (1.524 m)   Wt 90.3 kg   SpO2 94%   BMI 38.86 kg/m   PROVIDERS: Estill Hemming, DO   LABS: Labs reviewed: Acceptable for surgery. (all labs ordered are listed, but only abnormal results are displayed)  Labs Reviewed  CBC  COMPREHENSIVE METABOLIC PANEL WITH GFR  HEMOGLOBIN A1C  GLUCOSE, CAPILLARY  TYPE AND SCREEN     IMAGES: CT Chest 02/27/23:  IMPRESSION: 1. Lung-RADS 2, benign appearance or behavior. Continue annual screening with low-dose chest CT without contrast in 12 months. 2. Aortic atherosclerosis. 3. Mild diffuse bronchial wall thickening with mild centrilobular and paraseptal emphysema; imaging findings suggestive of underlying COPD. 4. There are calcifications of the aortic valve. Echocardiographic correlation for evaluation of potential valvular dysfunction may be warranted if clinically indicated.  EKG 09/02/23:  NSR  CV:  Cardiac monitor 05/28/2021:   The patient's monitoring period was from 05/28/21-06/26/21 Predominant rhythm was NSR with average HR  70bpm (53-134bpm) Rare VE, rare SVE (<1%) Patient triggered events associated with NSR with PACs There were no significant arrhythnias or pauses   Echo 05/25/2021:  IMPRESSIONS    1. Left ventricular ejection fraction, by estimation, is 60 to 65%. The left ventricle has normal function. The left ventricle has no regional wall motion abnormalities. There is mild left ventricular hypertrophy. Left ventricular diastolic parameters are consistent with Grade I diastolic dysfunction (impaired relaxation).  2. Right ventricular systolic function is normal. The right ventricular size is normal.  3. The mitral valve is normal in structure. Mild mitral valve regurgitation. No evidence of mitral stenosis.  4. The aortic valve was not well visualized. Aortic valve regurgitation is mild. No aortic stenosis is present.  5. The inferior vena cava is normal in size with greater than 50% respiratory variability, suggesting right atrial pressure of 3 mmHg. Past Medical History:  Diagnosis Date   Allergic rhinitis    Anxiety    Arthritis    Cataract    Depression    Diabetes mellitus without complication (HCC)    borderline   Emphysema lung (HCC)    Early stage   Endometrial polyp    Heart murmur    as a child   Hyperlipidemia    Hyperlipidemia    on simvastatin    Hypertension    Hypothyroidism    IBS (irritable bowel syndrome)    Obesity    Osteoarthritis    Seasonal allergies    Sleep apnea    no cpap, borderline diagnosis   Uterine cancer (HCC)  Vertigo 09/2022    Past Surgical History:  Procedure Laterality Date   BUNIONECTOMY  2009   Dr.Duda, left   CARPAL TUNNEL RELEASE Right 11/28/2021   Procedure: RIGHT CARPAL TUNNEL RELEASE;  Surgeon: Marilyn Shropshire, MD;  Location: [facility];  Service: Orthopedics;  Laterality: Right;   EYE SURGERY     HYSTEROSCOPY WITH D & C  04/24/2011   Procedure: DILATATION AND CURETTAGE (D&C) /HYSTEROSCOPY;  Surgeon: Stevenson Elbe, MD;  Location: [facility];  Service: Gynecology;  Laterality: N/A;   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   Dr Cherre Cornish FASCIA SURGERY  1994   Dr Abigail Abler   POLYPECTOMY  04/24/2011   Procedure: POLYPECTOMY;  Surgeon: Stevenson Elbe, MD;  Location: [facility];  Service: Gynecology;  Laterality: N/A;   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   Procedure: TOTAL KNEE ARTHROPLASTY;  Surgeon: Liliane Rei, MD;  Location: [facility];  Service: Orthopedics;  Laterality: Right;   TOTAL KNEE ARTHROPLASTY Left 10/07/2022   Procedure: TOTAL KNEE ARTHROPLASTY;  Surgeon: Liliane Rei, MD;  Location: [facility];  Service: Orthopedics;  Laterality: Left;    MEDICATIONS:  acetaminophen  (TYLENOL ) 500 MG tablet   Biotin 5000 MCG TABS   Collagen-Vitamin C-Biotin (COLLAGEN 1500/C PO)   cycloSPORINE  (RESTASIS ) 0.05 % ophthalmic emulsion   diphenhydramine -acetaminophen  (TYLENOL  PM) 25-500 MG TABS tablet   ibuprofen  (ADVIL ) 200 MG tablet   Ibuprofen -diphenhydrAMINE  HCl (ADVIL  PM) 200-25 MG CAPS   levothyroxine  (SYNTHROID ) 125 MCG tablet   loperamide  (IMODIUM ) 2 MG capsule   Melatonin 3 MG CAPS   metFORMIN  (GLUCOPHAGE ) 500 MG tablet   Multiple Vitamins-Minerals (CENTRUM SILVER) tablet   oxyCODONE  (OXY IR/ROXICODONE ) 5 MG immediate release tablet   Propylene Glycol (SYSTANE COMPLETE) 0.6 % SOLN   senna-docusate (SENOKOT-S) 8.6-50 MG tablet   sertraline  (ZOLOFT ) 100 MG tablet   simvastatin  (ZOCOR ) 20 MG tablet   Vitamin D , Ergocalciferol , (DRISDOL ) 1.25 MG (50000 UNIT) CAPS capsule   No current facility-administered medications for this encounter.   Antoinette Kirschner [facility] Short Stay/Anesthesiology [facility] Phone [phone] 12/01/2023 10:28 AM

## 2023-12-01 NOTE — Anesthesia Preprocedure Evaluation (Addendum)
 Anesthesia Evaluation  Patient identified by MRN, date of birth, ID band Patient awake    Reviewed: Allergy & Precautions, NPO status , Patient's Chart, lab work & pertinent test results  History of Anesthesia Complications Negative for: history of anesthetic complications  Airway Mallampati: II  TM Distance: >3 FB Neck ROM: Full    Dental  (+) Teeth Intact, Dental Advisory Given   Pulmonary neg shortness of breath, sleep apnea , COPD, neg recent URI, former smoker   breath sounds clear to auscultation       Cardiovascular hypertension,  Rhythm:Regular  1. Left ventricular ejection fraction, by estimation, is 60 to 65%. The  left ventricle has normal function. The left ventricle has no regional  wall motion abnormalities. There is mild left ventricular hypertrophy.  Left ventricular diastolic parameters  are consistent with Grade I diastolic dysfunction (impaired relaxation).   2. Right ventricular systolic function is normal. The right ventricular  size is normal.   3. The mitral valve is normal in structure. Mild mitral valve  regurgitation. No evidence of mitral stenosis.   4. The aortic valve was not well visualized. Aortic valve regurgitation  is mild. No aortic stenosis is present.   5. The inferior vena cava is normal in size with greater than 50%  respiratory variability, suggesting right atrial pressure of 3 mmHg.     Neuro/Psych neg Seizures PSYCHIATRIC DISORDERS Anxiety Depression       GI/Hepatic negative GI ROS, Neg liver ROS,,,  Endo/Other  diabetes, Type 2Hypothyroidism    Renal/GU Lab Results      Component                Value               Date                      NA                       137                 11/28/2023                K                        3.9                 11/28/2023                CO2                      25                  11/28/2023                GLUCOSE                  97                   11/28/2023                BUN                      20                  11/28/2023  CREATININE               0.63                11/28/2023                CALCIUM                  9.1                 11/28/2023                GFR                      88.57               03/03/2023                GFRNONAA                 >60                 11/28/2023                Musculoskeletal  (+) Arthritis ,    Abdominal   Peds  Hematology negative hematology ROS (+) Lab Results      Component                Value               Date                      WBC                      4.3                 11/28/2023                HGB                      13.7                11/28/2023                HCT                      42.3                11/28/2023                MCV                      94.8                11/28/2023                PLT                      155                 11/28/2023              Anesthesia Other Findings   Reproductive/Obstetrics                              Anesthesia Physical Anesthesia Plan  ASA: 2  Anesthesia Plan: General   Post-op Pain Management:  Tylenol  PO (pre-op)* and Ofirmev  IV (intra-op)*   Induction: Intravenous  PONV Risk Score and Plan: 4 or greater and Ondansetron , Dexamethasone  and Propofol  infusion  Airway Management Planned: Oral ETT  Additional Equipment: None  Intra-op Plan:   Post-operative Plan: Extubation in OR  Informed Consent: I have reviewed the patients History and Physical, chart, labs and discussed the procedure including the risks, benefits and alternatives for the proposed anesthesia with the patient or authorized representative who has indicated his/her understanding and acceptance.     Dental advisory given  Plan Discussed with: CRNA  Anesthesia Plan Comments: (See PAT note from 5/16)         Anesthesia Quick Evaluation

## 2023-12-02 ENCOUNTER — Telehealth: Payer: Self-pay | Admitting: *Deleted

## 2023-12-02 NOTE — Discharge Instructions (Addendum)
 AFTER SURGERY INSTRUCTIONS   Return to work: 4-6 weeks if applicable  Dr. Orvil Bland had to place several stitches at the entrance of the vagina to repair tears that occurred after the uterus came through the vagina. For vulvar soreness: We recommend purchasing several bags of frozen green peas and dividing them into ziploc bags. You will want to keep these in the freezer and have them ready to use as ice packs to the vulva for soreness as needed. Once the ice pack is no longer cold, you can get another from the freezer. The frozen peas mold to your body better than a regular ice pack.   Monitor your tylenol  and ibuprofen  use since you have several duplicate (with the same ingredients) medications on your medication list.   Activity: 1. Be up and out of the bed during the day.  Take a nap if needed.  You may walk up steps but be careful and use the hand rail.  Stair climbing will tire you more than you think, you may need to stop part way and rest.    2. No lifting or straining for 6 weeks over 10 pounds. No pushing, pulling, straining for 6 weeks.   3. No driving for 4-09 days when the following criteria have been met: Do not drive if you are taking narcotic pain medicine and make sure that your reaction time has returned.    4. You can shower as soon as the next day after surgery. Shower daily.  Use your regular soap and water  (not directly on the incision) and pat your incision(s) dry afterwards; don't rub.  No tub baths or submerging your body in water  until cleared by your surgeon. If you have the soap that was given to you by pre-surgical testing that was used before surgery, you do not need to use it afterwards because this can irritate your incisions.    5. No sexual activity and nothing in the vagina for 12 weeks.   6. You may experience a small amount of clear drainage from your incisions, which is normal.  If the drainage persists, increases, or changes color please call the office.   7.  Do not use creams, lotions, or ointments such as neosporin on your incisions after surgery until advised by your surgeon because they can cause removal of the dermabond glue on your incisions.     8. You may experience vaginal spotting after surgery or when the stitches at the top of the vagina begin to dissolve.  The spotting is normal but if you experience heavy bleeding, call our office.   9. Take Tylenol  or ibuprofen  first for pain if you are able to take these medications and only use narcotic pain medication for severe pain not relieved by the Tylenol  or Ibuprofen .  Monitor your Tylenol  intake to a max of 4,000 mg in a 24 hour period. You can alternate these medications after surgery.   Diet: 1. Low sodium Heart Healthy Diet is recommended but you are cleared to resume your normal (before surgery) diet after your procedure.   2. It is safe to use a laxative, such as Miralax  or Colace, if you have difficulty moving your bowels before surgery. You have been prescribed Sennakot-S to take at bedtime every evening after surgery to keep bowel movements regular and to prevent constipation.     Wound Care: 1. Keep clean and dry.  Shower daily.   Reasons to call the Doctor: Fever - Oral temperature greater than 100.4  degrees Fahrenheit Foul-smelling vaginal discharge Difficulty urinating Nausea and vomiting Increased pain at the site of the incision that is unrelieved with pain medicine. Difficulty breathing with or without chest pain New calf pain especially if only on one side Sudden, continuing increased vaginal bleeding with or without clots.   Contacts: For questions or concerns you should contact:   Dr. Wiley Hanger at [phone]   Vira Grieves, NP at [phone]   After Hours: call [phone] and have the GYN Oncologist paged/contacted (after 5 pm or on the weekends). You will speak with an after hours RN and let he or she know you have had surgery.   Messages sent via  mychart are for non-urgent matters and are not responded to after hours so for urgent needs, please call the after hours number.

## 2023-12-02 NOTE — Telephone Encounter (Signed)
 Telephone call to check on pre-operative status.  Patient compliant with pre-operative instructions.  Reinforced nothing to eat after midnight. Clear liquids until 0715. Patient to arrive at 0815.  No questions or concerns voiced.  Instructed to call for any needs.

## 2023-12-03 ENCOUNTER — Other Ambulatory Visit: Payer: Self-pay

## 2023-12-03 ENCOUNTER — Ambulatory Visit (HOSPITAL_BASED_OUTPATIENT_CLINIC_OR_DEPARTMENT_OTHER): Admitting: Anesthesiology

## 2023-12-03 ENCOUNTER — Ambulatory Visit (HOSPITAL_COMMUNITY)
Admission: RE | Admit: 2023-12-03 | Discharge: 2023-12-03 | Disposition: A | Discharge status: Home / Self Care | Source: Ambulatory Visit | Attending: Gynecologic Oncology | Admitting: Gynecologic Oncology

## 2023-12-03 ENCOUNTER — Ambulatory Visit (HOSPITAL_COMMUNITY): Payer: Self-pay | Admitting: Medical

## 2023-12-03 ENCOUNTER — Encounter (HOSPITAL_COMMUNITY): Payer: Self-pay | Admitting: Gynecologic Oncology

## 2023-12-03 ENCOUNTER — Encounter (HOSPITAL_COMMUNITY)
Admission: RE | Disposition: A | Discharge status: Home / Self Care | Payer: Self-pay | Source: Ambulatory Visit | Attending: Gynecologic Oncology

## 2023-12-03 DIAGNOSIS — J439 Emphysema, unspecified: Secondary | ICD-10-CM | POA: Diagnosis not present

## 2023-12-03 DIAGNOSIS — Z7984 Long term (current) use of oral hypoglycemic drugs: Secondary | ICD-10-CM | POA: Insufficient documentation

## 2023-12-03 DIAGNOSIS — C541 Malignant neoplasm of endometrium: Secondary | ICD-10-CM | POA: Diagnosis present

## 2023-12-03 DIAGNOSIS — E119 Type 2 diabetes mellitus without complications: Secondary | ICD-10-CM | POA: Diagnosis not present

## 2023-12-03 DIAGNOSIS — N7011 Chronic salpingitis: Secondary | ICD-10-CM | POA: Diagnosis not present

## 2023-12-03 DIAGNOSIS — N72 Inflammatory disease of cervix uteri: Secondary | ICD-10-CM | POA: Insufficient documentation

## 2023-12-03 DIAGNOSIS — I10 Essential (primary) hypertension: Secondary | ICD-10-CM | POA: Insufficient documentation

## 2023-12-03 DIAGNOSIS — Z87891 Personal history of nicotine dependence: Secondary | ICD-10-CM | POA: Diagnosis not present

## 2023-12-03 DIAGNOSIS — G4730 Sleep apnea, unspecified: Secondary | ICD-10-CM | POA: Diagnosis not present

## 2023-12-03 HISTORY — PX: ROBOTIC ASSISTED TOTAL HYSTERECTOMY WITH BILATERAL SALPINGO OOPHERECTOMY: SHX6086

## 2023-12-03 HISTORY — PX: INJECTION, FOR SENTINEL LYMPH NODE IDENTIFICATION: SHX7598

## 2023-12-03 HISTORY — PX: LYMPH NODE BIOPSY: SHX201

## 2023-12-03 LAB — ABO/RH: ABO/RH(D): O POS

## 2023-12-03 LAB — GLUCOSE, CAPILLARY
Glucose-Capillary: 98 mg/dL (ref 70–99)
Glucose-Capillary: 126 mg/dL — ABNORMAL HIGH (ref 70–99)

## 2023-12-03 LAB — TYPE AND SCREEN
ABO/RH(D): O POS
Antibody Screen: NEGATIVE

## 2023-12-03 SURGERY — HYSTERECTOMY, TOTAL, ROBOT-ASSISTED, LAPAROSCOPIC, WITH BILATERAL SALPINGO-OOPHORECTOMY
Anesthesia: General | Site: Pelvis

## 2023-12-03 MED ORDER — CHLORHEXIDINE GLUCONATE 0.12 % MT SOLN
15.0000 mL | Freq: Once | OROMUCOSAL | Status: AC
  Administered 2023-12-03: 15 mL via OROMUCOSAL

## 2023-12-03 MED ORDER — BUPIVACAINE HCL (PF) 0.25 % IJ SOLN
INTRAMUSCULAR | Status: AC
  Filled 2023-12-03: qty 30

## 2023-12-03 MED ORDER — BUPIVACAINE HCL 0.25 % IJ SOLN
INTRAMUSCULAR | Status: DC | PRN
  Administered 2023-12-03: 20 mL

## 2023-12-03 MED ORDER — FENTANYL CITRATE (PF) 100 MCG/2ML IJ SOLN
INTRAMUSCULAR | Status: DC | PRN
  Administered 2023-12-03 (×2): 50 ug via INTRAVENOUS

## 2023-12-03 MED ORDER — PROPOFOL 10 MG/ML IV BOLUS
INTRAVENOUS | Status: DC | PRN
  Administered 2023-12-03: 100 mg via INTRAVENOUS
  Administered 2023-12-03: 25 ug/kg/min via INTRAVENOUS

## 2023-12-03 MED ORDER — HYDROMORPHONE HCL 1 MG/ML IJ SOLN
INTRAMUSCULAR | Status: DC | PRN
  Administered 2023-12-03 (×3): .2 mg via INTRAVENOUS
  Administered 2023-12-03: .4 mg via INTRAVENOUS
  Administered 2023-12-03: .2 mg via INTRAVENOUS

## 2023-12-03 MED ORDER — ORAL CARE MOUTH RINSE: 15.0000 mL | Freq: Once | OROMUCOSAL | Status: AC

## 2023-12-03 MED ORDER — ONDANSETRON HCL 4 MG/2ML IJ SOLN
INTRAMUSCULAR | Status: DC | PRN
  Administered 2023-12-03: 4 mg via INTRAVENOUS

## 2023-12-03 MED ORDER — SUGAMMADEX SODIUM 200 MG/2ML IV SOLN
INTRAVENOUS | Status: DC | PRN
  Administered 2023-12-03: 200 mg via INTRAVENOUS

## 2023-12-03 MED ORDER — STERILE WATER FOR INJECTION IJ SOLN
INTRAMUSCULAR | Status: DC | PRN
  Administered 2023-12-03: 4 mL via INTRAMUSCULAR

## 2023-12-03 MED ORDER — PHENYLEPHRINE HCL (PRESSORS) 10 MG/ML IV SOLN
INTRAVENOUS | Status: DC | PRN
  Administered 2023-12-03: 120 ug via INTRAVENOUS

## 2023-12-03 MED ORDER — FENTANYL CITRATE PF 50 MCG/ML IJ SOSY
PREFILLED_SYRINGE | INTRAMUSCULAR | Status: AC
  Filled 2023-12-03: qty 2

## 2023-12-03 MED ORDER — LACTATED RINGERS IV SOLN: INTRAVENOUS | Status: DC

## 2023-12-03 MED ORDER — CEFAZOLIN SODIUM-DEXTROSE 2-4 GM/100ML-% IV SOLN
2.0000 g | INTRAVENOUS | Status: AC
  Administered 2023-12-03: 2 g via INTRAVENOUS
  Filled 2023-12-03: qty 100

## 2023-12-03 MED ORDER — FENTANYL CITRATE PF 50 MCG/ML IJ SOSY
25.0000 ug | PREFILLED_SYRINGE | INTRAMUSCULAR | Status: DC | PRN
  Administered 2023-12-03 (×2): 50 ug via INTRAVENOUS

## 2023-12-03 MED ORDER — OXYCODONE HCL 5 MG PO TABS
ORAL_TABLET | ORAL | Status: AC
  Filled 2023-12-03: qty 1

## 2023-12-03 MED ORDER — LACTATED RINGERS IR SOLN
Status: DC | PRN
  Administered 2023-12-03: 1000 mL

## 2023-12-03 MED ORDER — HYDROMORPHONE HCL 2 MG/ML IJ SOLN
INTRAMUSCULAR | Status: AC
  Filled 2023-12-03: qty 1

## 2023-12-03 MED ORDER — ROCURONIUM BROMIDE 100 MG/10ML IV SOLN
INTRAVENOUS | Status: DC | PRN
  Administered 2023-12-03: 60 mg via INTRAVENOUS

## 2023-12-03 MED ORDER — STERILE WATER FOR INJECTION IJ SOLN
INTRAMUSCULAR | Status: AC
  Filled 2023-12-03: qty 10

## 2023-12-03 MED ORDER — LIDOCAINE HCL (CARDIAC) PF 100 MG/5ML IV SOSY
PREFILLED_SYRINGE | INTRAVENOUS | Status: DC | PRN
  Administered 2023-12-03: 80 mg via INTRAVENOUS

## 2023-12-03 MED ORDER — FENTANYL CITRATE (PF) 100 MCG/2ML IJ SOLN
INTRAMUSCULAR | Status: AC
  Filled 2023-12-03: qty 2

## 2023-12-03 MED ORDER — ACETAMINOPHEN 500 MG PO TABS
1000.0000 mg | ORAL_TABLET | ORAL | Status: AC
  Administered 2023-12-03: 500 mg via ORAL
  Filled 2023-12-03: qty 2

## 2023-12-03 MED ORDER — INSULIN ASPART 100 UNIT/ML IJ SOLN: 0.0000 [IU] | INTRAMUSCULAR | Status: DC | PRN

## 2023-12-03 MED ORDER — HEPARIN SODIUM (PORCINE) 5000 UNIT/ML IJ SOLN
5000.0000 [IU] | INTRAMUSCULAR | Status: AC
Start: 2023-12-03 — End: 2023-12-03
  Administered 2023-12-03: 5000 [IU] via SUBCUTANEOUS
  Filled 2023-12-03: qty 1

## 2023-12-03 MED ORDER — MIDAZOLAM HCL 2 MG/2ML IJ SOLN
INTRAMUSCULAR | Status: AC
  Filled 2023-12-03: qty 2

## 2023-12-03 MED ORDER — DEXAMETHASONE SODIUM PHOSPHATE 10 MG/ML IJ SOLN
4.0000 mg | INTRAMUSCULAR | Status: AC
  Administered 2023-12-03: 10 mg via INTRAVENOUS

## 2023-12-03 MED ORDER — OXYCODONE HCL 5 MG/5ML PO SOLN: 5.0000 mg | Freq: Once | ORAL | Status: AC | PRN

## 2023-12-03 MED ORDER — OXYCODONE HCL 5 MG PO TABS
5.0000 mg | ORAL_TABLET | Freq: Once | ORAL | Status: AC | PRN
  Administered 2023-12-03: 5 mg via ORAL

## 2023-12-03 MED ORDER — STERILE WATER FOR IRRIGATION IR SOLN
Status: DC | PRN
  Administered 2023-12-03: 1000 mL

## 2023-12-03 SURGICAL SUPPLY — 67 items
APPLICATOR ARISTA FLEXITIP XL (MISCELLANEOUS) IMPLANT
APPLICATOR SURGIFLO ENDO (HEMOSTASIS) IMPLANT
BAG COUNTER SPONGE SURGICOUNT (BAG) IMPLANT
BAG LAPAROSCOPIC 12 15 PORT 16 (BASKET) IMPLANT
BLADE SURG SZ10 CARB STEEL (BLADE) IMPLANT
COVER BACK TABLE 60X90IN (DRAPES) ×2 IMPLANT
COVER TIP SHEARS 8 DVNC (MISCELLANEOUS) ×2 IMPLANT
DERMABOND ADVANCED .7 DNX12 (GAUZE/BANDAGES/DRESSINGS) ×2 IMPLANT
DRAPE ARM DVNC X/XI (DISPOSABLE) ×8 IMPLANT
DRAPE COLUMN DVNC XI (DISPOSABLE) ×2 IMPLANT
DRAPE SHEET LG 3/4 BI-LAMINATE (DRAPES) ×2 IMPLANT
DRAPE SURG IRRIG POUCH 19X23 (DRAPES) ×2 IMPLANT
DRIVER NDL MEGA SUTCUT DVNCXI (INSTRUMENTS) ×2 IMPLANT
DRIVER NDLE MEGA SUTCUT DVNCXI (INSTRUMENTS) ×2 IMPLANT
DRSG OPSITE POSTOP 4X6 (GAUZE/BANDAGES/DRESSINGS) IMPLANT
DRSG OPSITE POSTOP 4X8 (GAUZE/BANDAGES/DRESSINGS) IMPLANT
ELECT PENCIL ROCKER SW 15FT (MISCELLANEOUS) IMPLANT
ELECT REM PT RETURN 15FT ADLT (MISCELLANEOUS) ×2 IMPLANT
FORCEPS BPLR FENES DVNC XI (FORCEP) ×2 IMPLANT
FORCEPS PROGRASP DVNC XI (FORCEP) ×2 IMPLANT
GAUZE 4X4 16PLY ~~LOC~~+RFID DBL (SPONGE) ×4 IMPLANT
GLOVE BIO SURGEON STRL SZ 6 (GLOVE) ×8 IMPLANT
GLOVE BIO SURGEON STRL SZ 6.5 (GLOVE) ×2 IMPLANT
GOWN STRL REUS W/ TWL LRG LVL3 (GOWN DISPOSABLE) ×8 IMPLANT
GRASPER SUT TROCAR 14GX15 (MISCELLANEOUS) IMPLANT
HEMOSTAT ARISTA ABSORB 3G PWDR (HEMOSTASIS) IMPLANT
HOLDER FOLEY CATH W/STRAP (MISCELLANEOUS) IMPLANT
IRRIGATION SUCT STRKRFLW 2 WTP (MISCELLANEOUS) ×2 IMPLANT
KIT PROCEDURE DVNC SI (MISCELLANEOUS) IMPLANT
KIT TURNOVER KIT A (KITS) IMPLANT
LIGASURE IMPACT 36 18CM CVD LR (INSTRUMENTS) IMPLANT
MANIPULATOR ADVINCU DEL 3.0 PL (MISCELLANEOUS) IMPLANT
MANIPULATOR ADVINCU DEL 3.5 PL (MISCELLANEOUS) IMPLANT
MANIPULATOR UTERINE 4.5 ZUMI (MISCELLANEOUS) IMPLANT
NDL HYPO 21X1.5 SAFETY (NEEDLE) ×2 IMPLANT
NDL SPNL 18GX3.5 QUINCKE PK (NEEDLE) IMPLANT
NEEDLE HYPO 21X1.5 SAFETY (NEEDLE) ×2 IMPLANT
NEEDLE SPNL 18GX3.5 QUINCKE PK (NEEDLE) IMPLANT
OBTURATOR OPTICALSTD 8 DVNC (TROCAR) ×2 IMPLANT
PACK ROBOT GYN CUSTOM WL (TRAY / TRAY PROCEDURE) ×2 IMPLANT
PAD POSITIONING PINK XL (MISCELLANEOUS) ×2 IMPLANT
PORT ACCESS TROCAR AIRSEAL 12 (TROCAR) IMPLANT
SCISSORS MNPLR CVD DVNC XI (INSTRUMENTS) ×2 IMPLANT
SCRUB CHG 4% DYNA-HEX 4OZ (MISCELLANEOUS) IMPLANT
SEAL UNIV 5-12 XI (MISCELLANEOUS) ×8 IMPLANT
SET TRI-LUMEN FLTR TB AIRSEAL (TUBING) ×2 IMPLANT
SPIKE FLUID TRANSFER (MISCELLANEOUS) ×2 IMPLANT
SPONGE T-LAP 18X18 ~~LOC~~+RFID (SPONGE) IMPLANT
SURGIFLO W/THROMBIN 8M KIT (HEMOSTASIS) IMPLANT
SUT MNCRL AB 4-0 PS2 18 (SUTURE) IMPLANT
SUT PDS AB 1 TP1 96 (SUTURE) IMPLANT
SUT STRATA PDS 0 30 CT-2.5 (SUTURE) IMPLANT
SUT VIC AB 0 CT1 27XBRD ANTBC (SUTURE) IMPLANT
SUT VIC AB 2-0 CT1 TAPERPNT 27 (SUTURE) IMPLANT
SUT VIC AB 2-0 SH 27X BRD (SUTURE) IMPLANT
SUT VIC AB 4-0 PS2 18 (SUTURE) ×4 IMPLANT
SUT VICRYL 0 27 CT2 27 ABS (SUTURE) ×2 IMPLANT
SUT VLOC 180 0 9IN GS21 (SUTURE) IMPLANT
SYR 10ML LL (SYRINGE) IMPLANT
SYSTEM BAG RETRIEVAL 10MM (BASKET) IMPLANT
SYSTEM WOUND ALEXIS 18CM MED (MISCELLANEOUS) IMPLANT
TRAP SPECIMEN MUCUS 40CC (MISCELLANEOUS) IMPLANT
TRAY FOLEY MTR SLVR 16FR STAT (SET/KITS/TRAYS/PACK) ×2 IMPLANT
TROCAR PORT AIRSEAL 5X120 (TROCAR) IMPLANT
UNDERPAD 30X36 HEAVY ABSORB (UNDERPADS AND DIAPERS) ×4 IMPLANT
WATER STERILE IRR 1000ML POUR (IV SOLUTION) ×2 IMPLANT
YANKAUER SUCT BULB TIP 10FT TU (MISCELLANEOUS) IMPLANT

## 2023-12-03 NOTE — Interval H&P Note (Signed)
 History and Physical Interval Note:  12/03/2023 8:16 AM  [name] [name]  has presented today for surgery, with the diagnosis of [facility] CANCER.  The various methods of treatment have been discussed with the patient and family. After consideration of risks, benefits and other options for treatment, the patient has consented to  Procedure(s) with comments: HYSTERECTOMY, TOTAL, ROBOT-ASSISTED, LAPAROSCOPIC, WITH BILATERAL SALPINGO-OOPHORECTOMY (Bilateral) INJECTION, FOR SENTINEL LYMPH NODE IDENTIFICATION (N/A) LYMPH NODE BIOPSY (N/A) - POSSIBLE PELVIC LYMPHADENECTOMY, POSSIBLE LAPAROTOMY as a surgical intervention.  The patient's history has been reviewed, patient examined, no change in status, stable for surgery.  I have reviewed the patient's chart and labs.  Questions were answered to the patient's satisfaction.     Suzi Essex

## 2023-12-03 NOTE — Op Note (Signed)
 OPERATIVE NOTE  Pre-operative Diagnosis: endometrial cancer grade 2  Post-operative Diagnosis: same  Operation: Robotic-assisted laparoscopic total hysterectomy with bilateral salpingo-oophorectomy, SLN biopsy bilaterally, cystoscopy, repair vaginal laceration   Surgeon: Wiley Hanger MD  Assistant Surgeon: Vira Grieves, NP (an NP assistant was necessary for tissue manipulation, management of robotic instrumentation, retraction and positioning due to the complexity of the case and hospital policies).   Anesthesia: GET  Urine Output: 350 cc  Operative Findings: On EUA, small mobile uterus. Small vaginal canal and vaginal introitus. On intra-abdominal entry, normal upper abdominal survey. Normal small and large bowel, omentum. Uterus 6 cm and normal in appearance. Atrophic appearing bilateral tubes and ovaries. Minimal adhesions between the left adnexa and the sigmoid mesentery. Mapping successful to bilateral external iliac and left obturator SLNs, no adenopathy.   Estimated Blood Loss:  less than 100 mL      Total IV Fluids: see I&O flowsheet         Specimens: uterus, cervix, bilateral tubes and ovaries, bilateral external iliac SLNs, left obturator SLN         Complications:  None apparent; patient tolerated the procedure well.         Disposition: PACU - hemodynamically stable.  Procedure Details  The patient was seen in the Holding Room. The risks, benefits, complications, treatment options, and expected outcomes were discussed with the patient.  The patient concurred with the proposed plan, giving informed consent.  The site of surgery properly noted/marked. The patient was identified as [name] [name] and the procedure verified as a Robotic-assisted hysterectomy with bilateral salpingo- oophorectomy with SLN biopsy.   After induction of anesthesia, the patient was draped and prepped in the usual sterile manner. Patient was placed in supine position after anesthesia and draped  and prepped in the usual sterile manner as follows: Her arms were tucked to her side with all appropriate precautions.  The patient was secured to the bed using padding and tape across her chest.  The patient was placed in the semi-lithotomy position in [place] stirrups.  The perineum and vagina were prepped with CHG. The patient's abdomen was prepped with ChloraPrep and she was draped after the prep had been allowed to dry for 3 minutes.  A Time Out was held and the above information confirmed.  The urethra was prepped with Betadine . Foley catheter was placed.  A sterile speculum was placed in the vagina.  The cervix was grasped with a single-tooth tenaculum. 2mg  total of ICG was injected into the cervical stroma at 2 and 9 o'clock with 1cc injected at a 1cm and 2mm depth (concentration 0.5mg /ml) in all locations. The cervix was dilated with Adriana Hopping dilators.  The ZUMI uterine manipulator without the KOH ring (given small vaginal introitus and vaginal canal) was placed without difficulty.  OG tube placement was confirmed and to suction.   Next, a 10 mm skin incision was made 1 cm below the subcostal margin in the midclavicular line.  The 5 mm Optiview port and scope was used for direct entry.  Opening pressure was under 10 mm CO2.  The abdomen was insufflated and the findings were noted as above.   At this point and all points during the procedure, the patient's intra-abdominal pressure did not exceed 15 mmHg. Next, an 8 mm skin incision was made superior to the umbilicus and a right and left port were placed about 8 cm lateral to the robot port on the right and left side.  A fourth arm was placed  on the right.  The 5 mm assist trocar was exchanged for a 12 mm airseal port. All ports were placed under direct visualization.  The patient was placed in steep Trendelenburg.  Bowel was folded away into the upper abdomen.  The robot was docked in the normal manner.  The right and left peritoneum were opened parallel to  the IP ligament to open the retroperitoneal spaces bilaterally. The round ligaments were transected. The SLN mapping was performed in bilateral pelvic basins. After identifying the ureters, the para rectal and paravesical spaces were opened up entirely with careful dissection below the level of the ureters bilaterally and to the depth of the uterine artery origin in order to skeletonize the uterine "web" and ensure visualization of all parametrial channels. The para-aortic basins were carefully exposed and evaluated for isolated para-aortic SLNs. Lymphatic channels were identified travelling to the following visualized sentinel lymph node's: bilateral external iliac SLNs, left obturator SLN. These SLNs were separated from their surrounding lymphatic tissue, removed and sent for permanent pathology.  The hysterectomy was started.  The ureter was again noted to be on the medial leaf of the broad ligament.  The peritoneum above the ureter was incised and stretched and the infundibulopelvic ligament was skeletonized, cauterized and cut.  The posterior peritoneum was taken down to the level to an EEA that was placed in the vagina.  The anterior peritoneum was also taken down.  The bladder flap was created to the level of the EEA sizer.  The uterine artery on the right side was skeletonized, cauterized and cut in the normal manner.  A similar procedure was performed on the left.  The colpotomy was made and the uterus, cervix, bilateral ovaries and tubes were amputated and delivered through the vagina.  Pedicles were inspected and excellent hemostasis was achieved. Anterior and posterior cervical margins were resected and sent separately.   The colpotomy at the vaginal cuff was closed with 0 Vicryl with a figure of eight at each apex and 0 V-Loc to close the midportion of the cuff in two layers in a running manner.  Irrigation was used and excellent hemostasis was achieved.    Cystoscopy was performed after the  bladder was backfilled with 180 cc of sterile water . Findings noted above. Foley catheter was then replaced to drain the bladder.  Attention was turned back to the abdomen. The pelvis was irrigated again with good hemostasis noted. Arista was placed over the surgical beds. At this point in the procedure was completed.  Robotic instruments were removed under direct visulaization.  The robot was undocked. The fascia at the 10-12 mm port was closed with 0 Vicryl using a PMI fascial closure device under direct visualization.  The subcuticular tissue was closed with 4-0 Vicryl and the skin was closed with 4-0 Monocryl in a subcuticular manner.  Dermabond was applied.    The vagina was swabbed with minimal bleeding noted from posterior distal vaginal tear. This was repaired with 2-0 Vicryl in running fashion. Subsequently, minimal bleeding noted. Foley catheter was removed. All sponge, lap and needle counts were correct x 3.   The patient was transferred to the recovery room in stable condition.  Wiley Hanger, MD

## 2023-12-03 NOTE — Anesthesia Procedure Notes (Signed)
 Procedure Name: Intubation Date/Time: 12/03/2023 10:33 AM  Performed by: Judd Northern, RNPre-anesthesia Checklist: Patient identified, Emergency Drugs available, Suction available and Patient being monitored Patient Re-evaluated:Patient Re-evaluated prior to induction Oxygen Delivery Method: Circle System Utilized Preoxygenation: Pre-oxygenation with 100% oxygen Induction Type: IV induction Ventilation: Mask ventilation without difficulty Laryngoscope Size: Miller and 2 Grade View: Grade I Tube type: Oral Tube size: 7.0 mm Number of attempts: 1 Airway Equipment and Method: Stylet and Oral airway Placement Confirmation: ETT inserted through vocal cords under direct vision, positive ETCO2 and breath sounds checked- equal and bilateral Secured at: 22 cm Tube secured with: Tape Dental Injury: Teeth and Oropharynx as per pre-operative assessment  Comments: SRNA attempt DL with Miller 2, poor visualization. MD DL with [place] Key 2, gr 1 view. BL BS. Atraumatic insertion.

## 2023-12-03 NOTE — Anesthesia Postprocedure Evaluation (Signed)
 Anesthesia Post Note  Patient: [name] [name]  Procedure(s) Performed: HYSTERECTOMY, TOTAL, ROBOT-ASSISTED, LAPAROSCOPIC, WITH BILATERAL SALPINGO-OOPHORECTOMY (Bilateral: Abdomen) INJECTION, FOR SENTINEL LYMPH NODE IDENTIFICATION (Pelvis) LYMPH NODE BIOPSY (Pelvis)     Patient location during evaluation: PACU Anesthesia Type: General Level of consciousness: awake and alert Pain management: pain level controlled Vital Signs Assessment: post-procedure vital signs reviewed and stable Respiratory status: spontaneous breathing, nonlabored ventilation and respiratory function stable Cardiovascular status: blood pressure returned to baseline and stable Postop Assessment: no apparent nausea or vomiting Anesthetic complications: no   No notable events documented.                Taiden Raybourn

## 2023-12-03 NOTE — Transfer of Care (Signed)
 Immediate Anesthesia Transfer of Care Note  Patient: [name] [name]  Procedure(s) Performed: HYSTERECTOMY, TOTAL, ROBOT-ASSISTED, LAPAROSCOPIC, WITH BILATERAL SALPINGO-OOPHORECTOMY (Bilateral: Abdomen) INJECTION, FOR SENTINEL LYMPH NODE IDENTIFICATION (Pelvis) LYMPH NODE BIOPSY (Pelvis)  Patient Location: PACU  Anesthesia Type:General  Level of Consciousness: awake and alert   Airway & Oxygen Therapy: Patient Spontanous Breathing  Post-op Assessment: Report given to RN  Post vital signs: Reviewed and stable  Last Vitals:  Vitals Value Taken Time  BP  12/03/23 1303  Temp    Pulse 73 12/03/23 1309  Resp 13 12/03/23 1309  SpO2 99 % 12/03/23 1309  Vitals shown include unfiled device data.  Last Pain:  Vitals:   12/03/23 0900  TempSrc:   PainSc: 0-No pain      Patients Stated Pain Goal: 5 (12/03/23 0900)  Complications: No notable events documented.

## 2023-12-04 ENCOUNTER — Telehealth: Payer: Self-pay | Admitting: *Deleted

## 2023-12-04 ENCOUNTER — Encounter (HOSPITAL_COMMUNITY): Payer: Self-pay | Admitting: Gynecologic Oncology

## 2023-12-04 NOTE — Telephone Encounter (Signed)
 Spoke with Ms. Distler this morning. She states she is eating, drinking and urinating well. She has not had a BM yet but is passing gas. She is taking senokot as prescribed and encouraged her to drink plenty of water . She denies fever or chills. Incisions are dry and intact. She rates her pain 5/10. Her pain is controlled with oxycodone .    Instructed to call office with any fever, chills, purulent drainage, uncontrolled pain or any other questions or concerns. Patient verbalizes understanding.   Pt aware of post op appointments as well as the office number [phone] and after hours number [phone] to call if she has any questions or concerns

## 2023-12-09 ENCOUNTER — Ambulatory Visit: Payer: Self-pay | Admitting: Gynecologic Oncology

## 2023-12-09 ENCOUNTER — Telehealth: Payer: Self-pay

## 2023-12-09 LAB — SURGICAL PATHOLOGY

## 2023-12-09 NOTE — Telephone Encounter (Signed)
 S/P  Robotic-assisted laparoscopic total hysterectomy with bilateral salpingo-oophorectomy, SLN biopsy bilaterally, cystoscopy, repair vaginal laceration on 12/03/23 with Dr.Tucker.   Pt called office this afternoon reporting initially she was having pink discharge,  on day 2 it was old dark blood. Starting Saturday she has noticed new fresh bright red blood when wipes and sm amount on pad (changing pad once daily). Reports no cramping/pain, eating/drinking fine, no fever/chills. BM's are normal. No S&S of UTI (pain,pressure, burning or frequency) . Reports no heavy lifting or anything inserted in vagina. She has been bending over to feed her cats but nothing strenuous. No vaginal odor/discharge.   Advised could be healing from vaginal sutures, monitor and call back if gets heavier and changing pad several times in an hour or if pain starts.  Aware message will be sent to provider for review

## 2023-12-10 ENCOUNTER — Encounter: Payer: Self-pay | Admitting: Gynecologic Oncology

## 2023-12-10 ENCOUNTER — Inpatient Hospital Stay: Payer: Self-pay | Admitting: Gynecologic Oncology

## 2023-12-10 DIAGNOSIS — Z9079 Acquired absence of other genital organ(s): Secondary | ICD-10-CM

## 2023-12-10 DIAGNOSIS — C541 Malignant neoplasm of endometrium: Secondary | ICD-10-CM

## 2023-12-10 DIAGNOSIS — Z90722 Acquired absence of ovaries, bilateral: Secondary | ICD-10-CM

## 2023-12-10 DIAGNOSIS — Z90710 Acquired absence of both cervix and uterus: Secondary | ICD-10-CM

## 2023-12-10 DIAGNOSIS — Z7189 Other specified counseling: Secondary | ICD-10-CM

## 2023-12-10 NOTE — Progress Notes (Signed)
 Gynecologic Oncology Telehealth Note: Gyn-Onc  I connected with [name] on 12/10/23 at  6:00 PM EDT by telephone and verified that I am speaking with the correct person using two identifiers.  I discussed the limitations, risks, security and privacy concerns of performing an evaluation and management service by telemedicine and the availability of in-person appointments. I also discussed with the patient that there may be a patient responsible charge related to this service. The patient expressed understanding and agreed to proceed.  Other persons participating in the visit and their role in the encounter: none.  Patient's location: home Provider's location: [facility]  Reason for Visit: follow-up  Treatment History: Developed vaginal bleeding in February.  Describes this as pink spotting initially that then increased in quantity.  Denies any heavy bleeding ever. 09/26/23: Pelvic ultrasound - uterus 6 x 3.8 x 2.7 cm with endometrium measuring 13.3 mm.  11/06/23: D&C. 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  She endorses about 2 weeks of gradually decreasing bleeding after her biopsy.  12/03/23: Robotic-assisted laparoscopic total hysterectomy with bilateral salpingo-oophorectomy, SLN biopsy bilaterally, cystoscopy, repair vaginal laceration   Interval History: Some fresh blood since POD#3, uses 1 pad a day, streak of blood at the end of the day. Some blood when wipes, getting lighter since. Bowels moving well.   Past Medical/Surgical History: Past Medical History:  Diagnosis Date   Allergic rhinitis    Anxiety    Arthritis    Cataract    Depression    Diabetes mellitus without complication (HCC)    borderline   Emphysema lung (HCC)    Early stage   Endometrial polyp    Heart murmur    as a child   Hyperlipidemia    Hyperlipidemia    on simvastatin    Hypertension    Hypothyroidism     IBS (irritable bowel syndrome)    Obesity    Osteoarthritis    Seasonal allergies    Sleep apnea    no cpap, borderline diagnosis   Uterine cancer (HCC)    Vertigo 09/2022    Past Surgical History:  Procedure Laterality Date   BUNIONECTOMY  2009   Dr.Duda, left   CARPAL TUNNEL RELEASE Right 11/28/2021   Procedure: RIGHT CARPAL TUNNEL RELEASE;  Surgeon: Marilyn Shropshire, MD;  Location: [facility];  Service: Orthopedics;  Laterality: Right;   EYE SURGERY     HYSTEROSCOPY WITH D & C  04/24/2011   Procedure: DILATATION AND CURETTAGE (D&C) /HYSTEROSCOPY;  Surgeon: Stevenson Elbe, MD;  Location: [facility];  Service: Gynecology;  Laterality: N/A;   INJECTION, FOR SENTINEL LYMPH NODE IDENTIFICATION N/A 12/03/2023   Procedure: INJECTION, FOR SENTINEL LYMPH NODE IDENTIFICATION;  Surgeon: Suzi Essex, MD;  Location: [facility];  Service: Gynecology;  Laterality: N/A;   LYMPH NODE BIOPSY N/A 12/03/2023   Procedure: LYMPH NODE BIOPSY;  Surgeon: Suzi Essex, MD;  Location: [facility];  Service: Gynecology;  Laterality: N/A;  POSSIBLE PELVIC LYMPHADENECTOMY, POSSIBLE LAPAROTOMY   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   Dr Cherre Cornish FASCIA SURGERY  1994   Dr Abigail Abler   POLYPECTOMY  04/24/2011   Procedure: POLYPECTOMY;  Surgeon: Stevenson Elbe, MD;  Location: [facility];  Service: Gynecology;  Laterality: N/A;   ROBOTIC ASSISTED TOTAL HYSTERECTOMY WITH BILATERAL SALPINGO OOPHERECTOMY Bilateral 12/03/2023   Procedure: HYSTERECTOMY, TOTAL, ROBOT-ASSISTED, LAPAROSCOPIC, WITH BILATERAL SALPINGO-OOPHORECTOMY;  Surgeon:  Suzi Essex, MD;  Location: [facility];  Service: Gynecology;  Laterality: Bilateral;   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   Procedure: TOTAL KNEE ARTHROPLASTY;  Surgeon: Liliane Rei, MD;  Location: [facility];  Service: Orthopedics;  Laterality: Right;   TOTAL KNEE ARTHROPLASTY Left 10/07/2022   Procedure: TOTAL KNEE ARTHROPLASTY;  Surgeon: Liliane Rei,  MD;  Location: [facility];  Service: Orthopedics;  Laterality: Left;    Family History  Problem Relation Age of Onset   Pancreatic cancer Mother 58   Cancer Mother 71       pancreatic   Colon cancer Father 60   Cancer Father 55       colon   Alcoholism Father    Cancer Sister 63       melanoma   Breast cancer Sister    Thyroid  disease Sister    Cancer Sister        melanoma   Breast cancer Sister    Cancer Brother 50       skin   Heart attack Maternal Grandmother 50   Heart disease Maternal Grandmother    Colon cancer Paternal Grandmother    Cancer Paternal Grandmother 47       colon   Breast cancer Maternal Aunt 30   Heart attack Maternal Aunt 60   Prostate cancer Neg Hx    Ovarian cancer Neg Hx    Endometrial cancer Neg Hx     Social History   Socioeconomic History   Marital status: Widowed    Spouse name: [name]   Number of children: 0   Years of education: Not on file   Highest education level: Master's degree (e.g., MA, MS, MEng, MEd, MSW, MBA)  Occupational History   Occupation: Daycare p/t    Comment: retired  Tobacco Use   Smoking status: Former    Current packs/day: 0.00    Average packs/day: 2.0 packs/day for 42.0 years (84.0 ttl pk-yrs)    Types: Cigarettes    Start date: 07/28/1970    Quit date: 07/28/2012    Years since quitting: 11.3    Passive exposure: Never   Smokeless tobacco: Never  Vaping Use   Vaping status: Former  Substance and Sexual Activity   Alcohol  use: Yes    Comment: rare  2-3 x a year   Drug use: Never   Sexual activity: Yes    Partners: Male    Birth control/protection: Post-menopausal  Other Topics Concern   Not on file  Social History Narrative   Regular exercise- no now       Lives alone   Caffeine- 2 teas daily   Social Drivers of Health   Financial Resource Strain: Low Risk  (09/01/2023)   Overall Financial Resource Strain (CARDIA)    Difficulty of Paying Living Expenses: Not hard at all  Food Insecurity: No  Food Insecurity (09/01/2023)   Hunger Vital Sign    Worried About Running Out of Food in the Last Year: Never true    Ran Out of Food in the Last Year: Never true  Transportation Needs: No Transportation Needs (09/01/2023)   PRAPARE - Administrator, Civil Service (Medical): No    Lack of Transportation (Non-Medical): No  Physical Activity: Insufficiently Active (09/01/2023)   Exercise Vital Sign    Days of Exercise per Week: 1 day    Minutes of Exercise per Session: 20 min  Stress: Stress Concern Present (09/01/2023)   [employer] of Occupational Health -  Occupational Stress Questionnaire    Feeling of Stress : To some extent  Social Connections: Unknown (09/01/2023)   Social Connection and Isolation Panel [NHANES]    Frequency of Communication with Friends and Family: Three times a week    Frequency of Social Gatherings with Friends and Family: Twice a week    Attends Religious Services: Patient declined    Database administrator or Organizations: No    Attends Engineer, structural: Not on file    Marital Status: Widowed    Current Medications:  Current Outpatient Medications:    acetaminophen  (TYLENOL ) 500 MG tablet, Take 500-1,000 mg by mouth every 6 (six) hours as needed (pain)., Disp: , Rfl:    Biotin 5000 MCG TABS, Take 10,000 mcg by mouth every evening., Disp: , Rfl:    Collagen-Vitamin C-Biotin (COLLAGEN 1500/C PO), Take 1 Scoop by mouth in the morning. With coffee, Disp: , Rfl:    cycloSPORINE  (RESTASIS ) 0.05 % ophthalmic emulsion, Place 1 drop into both eyes 2 (two) times daily., Disp: , Rfl:    diphenhydramine -acetaminophen  (TYLENOL  PM) 25-500 MG TABS tablet, Take 1 tablet by mouth at bedtime as needed (pain/sleep)., Disp: , Rfl:    ibuprofen  (ADVIL ) 200 MG tablet, Take 200-400 mg by mouth every 8 (eight) hours as needed (pain.)., Disp: , Rfl:    Ibuprofen -diphenhydrAMINE  HCl (ADVIL  PM) 200-25 MG CAPS, Take 1 tablet by mouth at bedtime as needed  (pain/sleep)., Disp: , Rfl:    levothyroxine  (SYNTHROID ) 125 MCG tablet, Take 1 tablet (125 mcg total) by mouth daily before breakfast., Disp: 90 tablet, Rfl: 0   loperamide  (IMODIUM ) 2 MG capsule, Take 1 capsule (2 mg total) by mouth 2 (two) times daily. (Patient taking differently: Take 2 mg by mouth See admin instructions. Take 1 capsule (2 mg) by scheduled every morning, may take an additional dose in the afternoon if needed for diarrhea/loose stools.), Disp: 180 capsule, Rfl: 1   Melatonin 3 MG CAPS, Take 3 mg by mouth at bedtime., Disp: , Rfl:    metFORMIN  (GLUCOPHAGE ) 500 MG tablet, TAKE 1 TABLET DAILY WITH BREAKFAST, Disp: 90 tablet, Rfl: 3   Multiple Vitamins-Minerals (CENTRUM SILVER) tablet, Take 1 tablet by mouth every evening., Disp: , Rfl:    oxyCODONE  (OXY IR/ROXICODONE ) 5 MG immediate release tablet, Take 1 tablet (5 mg total) by mouth every 4 (four) hours as needed for severe pain (pain score 7-10). For AFTER surgery only, do not take and drive, Disp: 10 tablet, Rfl: 0   Propylene Glycol (SYSTANE COMPLETE) 0.6 % SOLN, Place 1 drop into both eyes 4 (four) times daily as needed (dry/irritated eyes.)., Disp: , Rfl:    senna-docusate (SENOKOT-S) 8.6-50 MG tablet, Take 2 tablets by mouth at bedtime. For AFTER surgery, do not take if having diarrhea, Disp: 30 tablet, Rfl: 0   sertraline  (ZOLOFT ) 100 MG tablet, 1 1/2 po every day (Patient taking differently: Take 100 mg by mouth in the morning.), Disp: 135 tablet, Rfl: 3   simvastatin  (ZOCOR ) 20 MG tablet, Take 1 tablet (20 mg total) by mouth at bedtime., Disp: 90 tablet, Rfl: 0   Vitamin D , Ergocalciferol , (DRISDOL ) 1.25 MG (50000 UNIT) CAPS capsule, Take 1 capsule (50,000 Units total) by mouth every 7 (seven) days. (Patient taking differently: Take 50,000 Units by mouth every Wednesday.), Disp: 13 capsule, Rfl: 3  Review of Symptoms: Pertinent positives as per HPI.  Physical Exam: Deferred given limitations of phone visit.  Laboratory &  Radiologic Studies: A. RIGHT EXTERNAL  ILIAC SENTINEL LYMPH NODE, EXCISION: One benign lymph node, negative for carcinoma (0/1)  B. LEFT OBTURATOR SENTINEL LYMPH NODE, EXCISION: One benign lymph node, negative for carcinoma (0/1)  C. LEFT EXTERNAL ILIAC SENTINEL LYMPH NODE, EXCISION: One benign lymph node, negative for carcinoma (0/1)  D. POSTERIOR CERVIX, BIOPSY: Benign squamous mucosa showing focal chronic inflammation Negative for carcinoma  E. 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 ANTERIOR CERVIX, BIOPSY: Benign cervical cyst stroma with focal squamous epithelium and chronic inflammation Negative for carcinoma   Assessment & Plan: [name] [name] is a 78 y.o. woman with suspected Stage IA1 (no residual EIN or cancer on hysterectomy specimen) who presents for follow-up.  Doing well.  Discussed close monitoring of her vaginal spotting, which is already decreasing.  If bleeding were to start increasing over the next 2 days, I have asked her to call the office.  Otherwise, if she still noticing anything more than an occasional spot when she wipes early next week.  I have asked her to call the office so that we can make an appointment next week to see her.  Reviewed continued expectations and restrictions.  Pathology with her from surgery.  I will reach out to pathology to see if there was any myometrium in the specimen from her D&C.  If not, then this was almost certainly an endometrium limited grade 2 endometrioid adenocarcinoma with no residual cancer on hysterectomy specimen.  I discussed the assessment and treatment plan with the patient. The patient was provided  with an opportunity to ask questions and all were answered. The patient agreed with the plan and demonstrated an understanding of the instructions.   The patient was advised to call back or see an in-person evaluation if the symptoms worsen or if the condition fails to improve as anticipated.   8 minutes of total time was spent for this patient encounter, including preparation, phone counseling with the patient and coordination of care, and documentation of the encounter.   Wiley Hanger, MD  Division of Gynecologic Oncology  Department of Obstetrics and Gynecology  [facility]

## 2023-12-10 NOTE — Telephone Encounter (Signed)
 I reached out to [name] [name] this morning. She states the bleeding has slowed down and still no pain. She has not checked bleeding status this morning yet. She did not receive a peri bottle but does have something to use at home. She states she has a phone appointment this afternoon with Dr.Tucker and will update her with bleeding status throughout the day.

## 2023-12-12 ENCOUNTER — Inpatient Hospital Stay: Admitting: Licensed Clinical Social Worker

## 2023-12-12 NOTE — Progress Notes (Signed)
 [facility] CSW Progress Note  Clinical Child psychotherapist contacted patient by phone to follow-up on coping post-surgery. Pt reports to be doing well. She is recovering physically and is experiencing great mental relief because of the outcome of surgery. CSW emphasized patient's strengths and growth through adversity over the last year and pt's ability to continue on that path.   CSW encouraged pt to call with any future support needs.    Margalit Leece E Daemion Mcniel, LCSW Clinical Social Worker [facility]

## 2023-12-25 ENCOUNTER — Inpatient Hospital Stay: Payer: Self-pay | Attending: Gynecologic Oncology | Admitting: Gynecologic Oncology

## 2023-12-25 ENCOUNTER — Encounter: Payer: Self-pay | Admitting: Gynecologic Oncology

## 2023-12-25 ENCOUNTER — Encounter: Payer: Self-pay | Admitting: Oncology

## 2023-12-25 VITALS — BP 132/67 | HR 68 | Temp 98.5°F | Resp 16 | Wt 203.8 lb

## 2023-12-25 DIAGNOSIS — Z7189 Other specified counseling: Secondary | ICD-10-CM

## 2023-12-25 DIAGNOSIS — C541 Malignant neoplasm of endometrium: Secondary | ICD-10-CM

## 2023-12-25 NOTE — Patient Instructions (Signed)
 It was great to see you.  You are healing very well from surgery!  Please remember, no heavy lifting for 6 weeks after surgery and nothing in the vagina for at least 12 weeks.  I will plan to see you for follow-up in 6 months.  As long as you are doing well, we will alternate future visits every 6 months between my office and your OB/GYN.  Between visits, if you develop any new and concerning symptoms including vaginal bleeding, pelvic pain, change to bowel or bladder function, unintentional weight loss, please call to see me sooner.

## 2023-12-25 NOTE — Progress Notes (Signed)
 Requested MSI and MLH1 promoter hypermethylation testing from [facility] via fax per Dr. Orvil Bland.

## 2023-12-25 NOTE — Progress Notes (Addendum)
 Gynecologic Oncology Return Clinic Visit  12/25/23  Reason for Visit: follow-up  Treatment History: Developed vaginal bleeding in February.  Describes this as pink spotting initially that then increased in quantity.  Denies any heavy bleeding ever. 09/26/23: Pelvic ultrasound - uterus 6 x 3.8 x 2.7 cm with endometrium measuring 13.3 mm.  11/06/23: D&C. 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  She endorses about 2 weeks of gradually decreasing bleeding after her biopsy.   12/03/23: Robotic-assisted laparoscopic total hysterectomy with bilateral salpingo-oophorectomy, SLN biopsy bilaterally, cystoscopy, repair vaginal laceration.   Interval History: Doing well.  Denies any further vaginal bleeding.  Denies abdominal pain.  Does have occasional cramping, especially in her upper abdomen.  Reports good bowel function.  Notes some increased difficulty with urinary incontinence since surgery.  Lymphedema improved immediately after surgery, slowly returning.  Past Medical/Surgical History: Past Medical History:  Diagnosis Date   Allergic rhinitis    Anxiety    Arthritis    Cataract    Depression    Diabetes mellitus without complication (HCC)    borderline   Emphysema lung (HCC)    Early stage   Endometrial polyp    Heart murmur    as a child   Hyperlipidemia    Hyperlipidemia    on simvastatin    Hypertension    Hypothyroidism    IBS (irritable bowel syndrome)    Obesity    Osteoarthritis    Seasonal allergies    Sleep apnea    no cpap, borderline diagnosis   Uterine cancer (HCC)    Vertigo 09/2022    Past Surgical History:  Procedure Laterality Date   BUNIONECTOMY  2009   Dr.Duda, left   CARPAL TUNNEL RELEASE Right 11/28/2021   Procedure: RIGHT CARPAL TUNNEL RELEASE;  Surgeon: Romona Harari, MD;  Location: [facility];  Service: Orthopedics;  Laterality: Right;    EYE SURGERY     HYSTEROSCOPY WITH D & C  04/24/2011   Procedure: DILATATION AND CURETTAGE (D&C) /HYSTEROSCOPY;  Surgeon: Shanda SHAUNNA Muscat, MD;  Location: [facility];  Service: Gynecology;  Laterality: N/A;   INJECTION, FOR SENTINEL LYMPH NODE IDENTIFICATION N/A 12/03/2023   Procedure: INJECTION, FOR SENTINEL LYMPH NODE IDENTIFICATION;  Surgeon: Viktoria Comer SAUNDERS, MD;  Location: [facility];  Service: Gynecology;  Laterality: N/A;   LYMPH NODE BIOPSY N/A 12/03/2023   Procedure: LYMPH NODE BIOPSY;  Surgeon: Viktoria Comer SAUNDERS, MD;  Location: [facility];  Service: Gynecology;  Laterality: N/A;  POSSIBLE PELVIC LYMPHADENECTOMY, POSSIBLE LAPAROTOMY   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   Dr Carlie FESTER [facility]ASC[facility] SURGERY  1994   Dr Beverley   POLYPECTOMY  04/24/2011   Procedure: POLYPECTOMY;  Surgeon: Shanda SHAUNNA Muscat, MD;  Location: [facility];  Service: Gynecology;  Laterality: N/A;   ROBOTIC ASSISTED TOTAL HYSTERECTOMY WITH BILATERAL SALPINGO OOPHERECTOMY Bilateral 12/03/2023   Procedure: HYSTERECTOMY, TOTAL, ROBOT-ASSISTED, LAPAROSCOPIC, WITH BILATERAL SALPINGO-OOPHORECTOMY;  Surgeon: Viktoria Comer SAUNDERS, MD;  Location: [facility];  Service: Gynecology;  Laterality: Bilateral;   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   Procedure: TOTAL KNEE ARTHROPLASTY;  Surgeon: Melodi Lerner, MD;  Location: [facility];  Service: Orthopedics;  Laterality: Right;   TOTAL KNEE ARTHROPLASTY Left 10/07/2022   Procedure: TOTAL KNEE ARTHROPLASTY;  Surgeon: Melodi Lerner, MD;  Location: [facility];  Service: Orthopedics;  Laterality: Left;    Family History  Problem Relation Age of Onset  Pancreatic cancer Mother 27   Cancer Mother 5       pancreatic   Colon cancer Father 40   Cancer Father 74       colon   Alcoholism Father    Cancer Sister 22       melanoma   Breast cancer Sister    Thyroid  disease Sister    Cancer Sister        melanoma   Breast cancer Sister    Cancer Brother 45       skin   Heart attack Maternal  Grandmother 50   Heart disease Maternal Grandmother    Colon cancer Paternal Grandmother    Cancer Paternal Grandmother 50       colon   Breast cancer Maternal Aunt 30   Heart attack Maternal Aunt 60   Prostate cancer Neg Hx    Ovarian cancer Neg Hx    Endometrial cancer Neg Hx     Social History   Socioeconomic History   Marital status: Widowed    Spouse name: Lanai Conlee   Number of children: 0   Years of education: Not on file   Highest education level: Master's degree (e.g., MA, MS, MEng, MEd, MSW, MBA)  Occupational History   Occupation: Daycare p/t    Comment: retired  Tobacco Use   Smoking status: Former    Current packs/day: 0.00    Average packs/day: 2.0 packs/day for 42.0 years (84.0 ttl pk-yrs)    Types: Cigarettes    Start date: 07/28/1970    Quit date: 07/28/2012    Years since quitting: 11.4    Passive exposure: Never   Smokeless tobacco: Never  Vaping Use   Vaping status: Former  Substance and Sexual Activity   Alcohol  use: Yes    Comment: rare  2-3 x a year   Drug use: Never   Sexual activity: Yes    Partners: Male    Birth control/protection: Post-menopausal  Other Topics Concern   Not on file  Social History Narrative   Regular exercise- no now       Lives alone   Caffeine- 2 teas daily   Social Drivers of Health   Financial Resource Strain: Low Risk  (09/01/2023)   Overall Financial Resource Strain (CARDIA)    Difficulty of Paying Living Expenses: Not hard at all  Food Insecurity: No Food Insecurity (09/01/2023)   Hunger Vital Sign    Worried About Running Out of Food in the Last Year: Never true    Ran Out of Food in the Last Year: Never true  Transportation Needs: No Transportation Needs (09/01/2023)   PRAPARE - Administrator, Civil Service (Medical): No    Lack of Transportation (Non-Medical): No  Physical Activity: Insufficiently Active (09/01/2023)   Exercise Vital Sign    Days of Exercise per Week: 1 day    Minutes of  Exercise per Session: 20 min  Stress: Stress Concern Present (09/01/2023)   [employer] of Occupational Health - Occupational Stress Questionnaire    Feeling of Stress : To some extent  Social Connections: Unknown (09/01/2023)   Social Connection and Isolation Panel    Frequency of Communication with Friends and Family: Three times a week    Frequency of Social Gatherings with Friends and Family: Twice a week    Attends Religious Services: Patient declined    Database Administrator or Organizations: No    Attends Banker Meetings: Not on file  Marital Status: Widowed    Current Medications:  Current Outpatient Medications:    acetaminophen  (TYLENOL ) 500 MG tablet, Take 500-1,000 mg by mouth every 6 (six) hours as needed (pain)., Disp: , Rfl:    Biotin 5000 MCG TABS, Take 10,000 mcg by mouth every evening., Disp: , Rfl:    Collagen-Vitamin C-Biotin (COLLAGEN 1500/C PO), Take 1 Scoop by mouth in the morning. With coffee, Disp: , Rfl:    cycloSPORINE  (RESTASIS ) 0.05 % ophthalmic emulsion, Place 1 drop into both eyes 2 (two) times daily., Disp: , Rfl:    diphenhydramine -acetaminophen  (TYLENOL  PM) 25-500 MG TABS tablet, Take 1 tablet by mouth at bedtime as needed (pain/sleep)., Disp: , Rfl:    ibuprofen  (ADVIL ) 200 MG tablet, Take 200-400 mg by mouth every 8 (eight) hours as needed (pain.)., Disp: , Rfl:    Ibuprofen -diphenhydrAMINE  HCl (ADVIL  PM) 200-25 MG CAPS, Take 1 tablet by mouth at bedtime as needed (pain/sleep)., Disp: , Rfl:    levothyroxine  (SYNTHROID ) 125 MCG tablet, Take 1 tablet (125 mcg total) by mouth daily before breakfast., Disp: 90 tablet, Rfl: 0   loperamide  (IMODIUM ) 2 MG capsule, Take 1 capsule (2 mg total) by mouth 2 (two) times daily. (Patient taking differently: Take 2 mg by mouth See admin instructions. Take 1 capsule (2 mg) by scheduled every morning, may take an additional dose in the afternoon if needed for diarrhea/loose stools.), Disp: 180 capsule,  Rfl: 1   Melatonin 3 MG CAPS, Take 3 mg by mouth at bedtime., Disp: , Rfl:    metFORMIN  (GLUCOPHAGE ) 500 MG tablet, TAKE 1 TABLET DAILY WITH BREAKFAST, Disp: 90 tablet, Rfl: 3   Multiple Vitamins-Minerals (CENTRUM SILVER) tablet, Take 1 tablet by mouth every evening., Disp: , Rfl:    oxyCODONE  (OXY IR/ROXICODONE ) 5 MG immediate release tablet, Take 1 tablet (5 mg total) by mouth every 4 (four) hours as needed for severe pain (pain score 7-10). For AFTER surgery only, do not take and drive, Disp: 10 tablet, Rfl: 0   Propylene Glycol (SYSTANE COMPLETE) 0.6 % SOLN, Place 1 drop into both eyes 4 (four) times daily as needed (dry/irritated eyes.)., Disp: , Rfl:    senna-docusate (SENOKOT-S) 8.6-50 MG tablet, Take 2 tablets by mouth at bedtime. For AFTER surgery, do not take if having diarrhea, Disp: 30 tablet, Rfl: 0   sertraline  (ZOLOFT ) 100 MG tablet, 1 1/2 po every day (Patient taking differently: Take 100 mg by mouth in the morning.), Disp: 135 tablet, Rfl: 3   simvastatin  (ZOCOR ) 20 MG tablet, Take 1 tablet (20 mg total) by mouth at bedtime., Disp: 90 tablet, Rfl: 0   Vitamin D , Ergocalciferol , (DRISDOL ) 1.25 MG (50000 UNIT) CAPS capsule, Take 1 capsule (50,000 Units total) by mouth every 7 (seven) days. (Patient taking differently: Take 50,000 Units by mouth every Wednesday.), Disp: 13 capsule, Rfl: 3  Review of Systems: Denies appetite changes, fevers, chills, fatigue, unexplained weight changes. Denies hearing loss, neck lumps or masses, mouth sores, ringing in ears or voice changes. Denies cough or wheezing.  Denies shortness of breath. Denies chest pain or palpitations. Denies leg swelling. Denies abdominal distention, pain, blood in stools, constipation, diarrhea, nausea, vomiting, or early satiety. Denies pain with intercourse, dysuria, frequency, hematuria or incontinence. Denies hot flashes, pelvic pain, vaginal bleeding or vaginal discharge.   Denies joint pain, back pain or muscle  pain/cramps. Denies itching, rash, or wounds. Denies dizziness, headaches, numbness or seizures. Denies swollen lymph nodes or glands, denies easy bruising or bleeding. Denies anxiety,  depression, confusion, or decreased concentration.  Physical Exam: BP 132/67 (BP Location: Left Arm, Patient Position: Sitting)   Pulse 68   Temp 98.5 F (36.9 C) (Temporal)   Resp 16   Wt 203 lb 12.8 oz (92.4 kg)   SpO2 98%   BMI 39.80 kg/m  General: Alert, oriented, no acute distress. HEENT: Posterior oropharynx clear, sclera anicteric. Chest: Unlabored breathing on room air. Abdomen: Obese, soft, nontender.  Normoactive bowel sounds.  No masses or hepatosplenomegaly appreciated.  Well-healed incisions. Extremities: Grossly normal range of motion.  Warm, well perfused.  Trace edema bilaterally. GU: Normal appearing external genitalia without erythema, excoriation, or lesions.  Speculum exam reveals suture along posterior vagina, healing well.  Cuff itself is intact with suture visible, no bleeding.  Bimanual exam reveals cuff intact, no fluctuance or tenderness to palpation.    Laboratory & Radiologic Studies: A. RIGHT EXTERNAL ILIAC SENTINEL LYMPH NODE, EXCISION: One benign lymph node, negative for carcinoma (0/1)  B. LEFT OBTURATOR SENTINEL LYMPH NODE, EXCISION: One benign lymph node, negative for carcinoma (0/1)  C. LEFT EXTERNAL ILIAC SENTINEL LYMPH NODE, EXCISION: One benign lymph node, negative for carcinoma (0/1)  D. POSTERIOR CERVIX, BIOPSY: Benign squamous mucosa showing focal chronic inflammation Negative for carcinoma  E. 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  ANTERIOR CERVIX, BIOPSY: Benign cervical cyst stroma with focal squamous epithelium and chronic inflammation Negative for carcinoma   Assessment & Plan: ROSANGELA FEHRENBACH is a 78 y.o. 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   The patient is recovering well without issues today.  Vaginal bleeding after surgery resolved.  Discussed starting some Kegel exercises when she is about 6 weeks out from surgery.  She has a benign exam today. Signs and symptoms of endometrial cancer recurrence were reviewed.  I stressed the importance of calling if she develops any of these between visits.  We discussed her pathology report and prognostic features today. We discussed that no additional therapy is indicated as she DOES NOT meet GOG-99 high intermediate risk criteria.   Per NCCN surveillance recommendations, we will plan on visits every 6 months alternating between my office and her OB/GYN.  I will see her for her first visit in 6 months.  More than 20 minutes of total time was spent for this patient encounter, including preparation, face-to-face counseling with the patient and coordination of care, and documentation of the encounter. The discussion of adjuvant therapy and surveillance are beyond the scope of routine postoperative care.   Comer Dollar, MD  Division of Gynecologic Oncology  Department of Obstetrics and Gynecology  [facility]

## 2023-12-29 ENCOUNTER — Encounter: Payer: Self-pay | Admitting: Family Medicine

## 2023-12-29 ENCOUNTER — Other Ambulatory Visit (HOSPITAL_BASED_OUTPATIENT_CLINIC_OR_DEPARTMENT_OTHER): Payer: Self-pay | Admitting: Family Medicine

## 2023-12-29 DIAGNOSIS — Z1231 Encounter for screening mammogram for malignant neoplasm of breast: Secondary | ICD-10-CM

## 2024-01-19 ENCOUNTER — Inpatient Hospital Stay: Payer: Self-pay

## 2024-01-19 ENCOUNTER — Inpatient Hospital Stay: Attending: Gynecologic Oncology | Admitting: Genetic Counselor

## 2024-01-19 ENCOUNTER — Encounter: Payer: Self-pay | Admitting: Genetic Counselor

## 2024-01-19 DIAGNOSIS — Z803 Family history of malignant neoplasm of breast: Secondary | ICD-10-CM | POA: Diagnosis not present

## 2024-01-19 DIAGNOSIS — C541 Malignant neoplasm of endometrium: Secondary | ICD-10-CM | POA: Diagnosis present

## 2024-01-19 DIAGNOSIS — Z800 Family history of malignant neoplasm of digestive organs: Secondary | ICD-10-CM | POA: Insufficient documentation

## 2024-01-19 LAB — GENETIC SCREENING ORDER

## 2024-01-19 NOTE — Progress Notes (Signed)
 REFERRING PROVIDER: Viktoria Comer SAUNDERS, MD   PRIMARY PROVIDER:  Antonio Meth, Jamee SAUNDERS, DO  PRIMARY REASON FOR VISIT:  1. Endometrial cancer (HCC)   2. Family history of malignant neoplasm of gastrointestinal tract   3. Family history of malignant neoplasm of breast      HISTORY OF PRESENT ILLNESS:   [name] [name], a 78 y.o. female, was seen for a [facility] consultation at the request of Viktoria Comer SAUNDERS, MD due to a personal and family history of cancer.  [name] [name] presents to clinic today to discuss the possibility of a hereditary predisposition to cancer, to discuss genetic testing, and to further clarify her future cancer risks, as well as potential cancer risks for family members.   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 Treated with a TLH/BSO on 12/03/23.   RELEVANT MEDICAL HISTORY:  Menarche was at age 24-13.  Nulliparous. Ovaries intact: no.  Uterus intact: no.  Menopausal status: postmenopausal.  HRT use: 0 years. Colonoscopy: yes; normal. Mammogram within the last year: yes. Number of breast biopsies: 0.   Past Medical History:  Diagnosis Date   Allergic rhinitis    Anxiety    Arthritis    Cataract    Depression    Diabetes mellitus without complication (HCC)    borderline   Emphysema lung (HCC)    Early stage   Endometrial polyp    Heart murmur    as a child   Hyperlipidemia    Hyperlipidemia    on simvastatin    Hypertension    Hypothyroidism    IBS (irritable bowel syndrome)    Obesity    Osteoarthritis    Seasonal allergies    Sleep apnea    no cpap, borderline diagnosis   Uterine cancer (HCC)    Vertigo 09/2022    Past Surgical History:  Procedure Laterality Date   BUNIONECTOMY  2009   Dr.Duda, left   CARPAL TUNNEL RELEASE Right 11/28/2021   Procedure: RIGHT CARPAL TUNNEL RELEASE;  Surgeon: Romona Harari, MD;  Location: [facility];  Service: Orthopedics;  Laterality: Right;   EYE SURGERY     HYSTEROSCOPY WITH D & C  04/24/2011   Procedure: DILATATION AND CURETTAGE (D&C) /HYSTEROSCOPY;  Surgeon: Shanda SHAUNNA Muscat, MD;  Location: [facility];  Service: Gynecology;  Laterality: N/A;   INJECTION, FOR SENTINEL LYMPH NODE IDENTIFICATION N/A 12/03/2023   Procedure: INJECTION, FOR SENTINEL LYMPH NODE IDENTIFICATION;  Surgeon: Viktoria Comer SAUNDERS, MD;  Location: [facility];  Service: Gynecology;  Laterality: N/A;   LYMPH NODE BIOPSY N/A 12/03/2023   Procedure: LYMPH NODE BIOPSY;  Surgeon: Viktoria Comer SAUNDERS, MD;  Location: [facility];  Service: Gynecology;  Laterality: N/A;  POSSIBLE PELVIC LYMPHADENECTOMY, POSSIBLE LAPAROTOMY   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   Dr Carlie FESTER FASCIA SURGERY  1994   Dr Beverley   POLYPECTOMY  04/24/2011   Procedure: POLYPECTOMY;  Surgeon: Shanda SHAUNNA Muscat, MD;  Location: [facility];  Service: Gynecology;  Laterality: N/A;   ROBOTIC ASSISTED TOTAL HYSTERECTOMY WITH BILATERAL SALPINGO OOPHERECTOMY Bilateral 12/03/2023   Procedure: HYSTERECTOMY, TOTAL, ROBOT-ASSISTED, LAPAROSCOPIC, WITH BILATERAL SALPINGO-OOPHORECTOMY;  Surgeon: Viktoria Comer SAUNDERS, MD;  Location: [facility];  Service: Gynecology;  Laterality: Bilateral;   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   Procedure: TOTAL KNEE ARTHROPLASTY;  Surgeon: Melodi Lerner, MD;  Location: [facility];  Service: Orthopedics;  Laterality: Right;   TOTAL KNEE ARTHROPLASTY Left 10/07/2022   Procedure: TOTAL KNEE ARTHROPLASTY;  Surgeon: Melodi Lerner, MD;  Location: [facility];  Service: Orthopedics;  Laterality: Left;    Social History   Socioeconomic History   Marital status: Widowed    Spouse name: Jonnie Kubly   Number of children: 0   Years of education: Not on file   Highest education level: Master's degree (e.g., MA, MS, MEng, MEd, MSW, MBA)  Occupational History   Occupation: Daycare p/t    Comment: retired  Tobacco Use   Smoking status:  Former    Current packs/day: 0.00    Average packs/day: 2.0 packs/day for 42.0 years (84.0 ttl pk-yrs)    Types: Cigarettes    Start date: 07/28/1970    Quit date: 07/28/2012    Years since quitting: 11.4    Passive exposure: Never   Smokeless tobacco: Never  Vaping Use   Vaping status: Former  Substance and Sexual Activity   Alcohol  use: Yes    Comment: rare  2-3 x a year   Drug use: Never   Sexual activity: Yes    Partners: Male    Birth control/protection: Post-menopausal  Other Topics Concern   Not on file  Social History Narrative   Regular exercise- no now       Lives alone   Caffeine- 2 teas daily   Social Drivers of Health   Financial Resource Strain: Low Risk  (09/01/2023)   Overall Financial Resource Strain (CARDIA)    Difficulty of Paying Living Expenses: Not hard at all  Food Insecurity: No Food Insecurity (09/01/2023)   Hunger Vital Sign    Worried About Running Out of Food in the Last Year: Never true    Ran Out of Food in the Last Year: Never true  Transportation Needs: No Transportation Needs (09/01/2023)   PRAPARE - Administrator, Civil Service (Medical): No    Lack of Transportation (Non-Medical): No  Physical Activity: Insufficiently Active (09/01/2023)   Exercise Vital Sign    Days of Exercise per Week: 1 day    Minutes of Exercise per Session: 20 min  Stress: Stress Concern Present (09/01/2023)   [employer] of Occupational Health - Occupational Stress Questionnaire    Feeling of Stress : To some extent  Social Connections: Unknown (09/01/2023)   Social Connection and Isolation Panel    Frequency of Communication with Friends and Family: Three times a week    Frequency of Social Gatherings with Friends and Family: Twice a week    Attends Religious Services: Patient declined    Database administrator or Organizations: No    Attends Engineer, structural: Not on file    Marital Status: Widowed     FAMILY HISTORY:  We  obtained a detailed, 4-generation family history.  Significant diagnoses are listed below: Family History  Problem Relation Age of Onset   Pancreatic cancer Mother 65   Colon cancer Father 50   Alcoholism Father    Breast cancer Sister 97   Lung cancer Sister 78   Melanoma Sister 65       back   Thyroid  disease Sister    Melanoma Sister 13   Breast cancer Sister 78   Breast cancer Maternal Aunt 45       bilateral   Heart attack Maternal Aunt 60   Cancer Maternal Uncle 67       mouth   Stomach cancer Maternal Uncle 55  Heart attack Maternal Grandmother 50   Heart disease Maternal Grandmother    Colon cancer Paternal Grandmother 21   Cancer Maternal Cousin 58       female cancer   Prostate cancer Neg Hx    Ovarian cancer Neg Hx    Endometrial cancer Neg Hx     [name] [name] is unaware of previous family history of genetic testing for hereditary cancer risks. Patient's maternal ancestors are of Irish/Scottish descent, and paternal ancestors are of Irish/Scottish descent. There is no reported Ashkenazi Jewish ancestry.     GENETIC COUNSELING ASSESSMENT: [name] [name] is a 78 y.o. female with a personal and family history of cancer which is suggestive of a hereditary predisposition to cancer given the loss of MLH1 and PMS2 protein on IHC analysis of endometrial cancer, family history of a first degree relative with colon cancer diagnosed under the age of 60, family history of a first degree relative with pancreatic cancer, and family history of three maternal first and second degree relatives with breast cancer. We, therefore, discussed and recommended the following at today's visit.   DISCUSSION: We discussed that 5 - 10% of cancer is hereditary, with most cases of endometrial cancer associated with Lynch syndrome.  There are other genes that can be associated with hereditary cancer syndromes.  We discussed that testing is beneficial for several reasons including knowing how to follow  individuals after completing their treatment, identifying whether potential treatment options would be beneficial, and understanding if other family members could be at risk for cancer and allowing them to undergo genetic testing.   We discussed that Aldona GORMAN Durwin had IHC screening completed on the endometrial cancer, which did identify loss of the MLH1 and PMS2 protein(s). IHC screening looks to see if certain proteins are absent in the tumor sample, indicating that one of the Lynch syndrome genes might not be working properly. Lynch syndrome mutations affect whether the MLH1, MSH2, MSH6, and PMS2 proteins are made and located in the correct place. Loss of MLH1/PMS2 on IHC is suggestive of Lynch syndrome, but is not diagnostic testing. Additionally, it is unclear if MLH1 promoter hypermethylation or BRAF analysis was completed. Germline testing is needed to confirm if the loss of MLH1/PMS2 protein is caused by sporadic mutations in the cancer or if LIELLE VANDERVORT was born with a mutation that predisposed them to cancer. If born with a mutation, this would be consistent with a diagnosis of Lynch syndrome.   Lynch syndrome is an inherited cancer syndrome that can increase the risk for colorectal, endometrial, ovarian, gastric, pancreatic, and other cancers. It is caused by germline (present at birth) mutations in MLH1, MSH2, MSH6, PMS2 or EPCAM. We also discussed how the identification for a mutation would impact medical care per NCCN guidelines. Lynch syndrome mutations are inherited in an autosomal dominant fashion. This means that children, siblings, and parents of individuals with Lynch syndrome have a 50% (1 in 2) chance of having the same mutation.  We reviewed the characteristics, features and inheritance patterns of hereditary cancer syndromes. We also discussed genetic testing, including the appropriate family members to test, the process of testing, insurance coverage and turn-around-time for  results. We discussed the implications of a negative, positive, carrier and/or variant of uncertain significant result. We recommended [name] [name] pursue genetic testing for a panel that includes genes associated with endometrial, colon, breast, and pancreatic cancer.   [name] [name]  was offered a common hereditary cancer panel (40 genes) and an  expanded pan-cancer panel (77 genes). [name] [name] was informed of the benefits and limitations of each panel, including that expanded pan-cancer panels contain genes that do not have clear management guidelines at this point in time.  We also discussed that as the number of genes included on a panel increases, the chances of variants of uncertain significance increases. After considering the benefits and limitations of each gene panel, [name] [name] elected to have [facility] CancerNext-Expanded +RNAinsight panel. The CancerNext-Expanded gene panel offered by [facility] and includes sequencing, rearrangement, and RNA analysis for the following 77 genes: AIP, ALK, APC, ATM, AXIN2, BAP1, BARD1, BMPR1A, BRCA1, BRCA2, BRIP1, CDC73, CDH1, CDK4, CDKN1B, CDKN2A, CEBPA, CHEK2, CTNNA1, DDX41, DICER1, EGFR, EPCAM, ETV6, FH, FLCN, GATA2, GREM1, HOXB13, KIT, LZTR1, MAX, MBD4, MEN1, MET, MITF, MLH1, MSH2, MSH3, MSH6, MUTYH, NF1, NF2, NTHL1, PALB2, PDGFRA, PHOX2B, PMS2, POLD1, POLE, POT1, PRKAR1A, PTCH1, PTEN, RAD51C, RAD51D, RB1, RET, RPS20, RUNX1, SDHA, SDHAF2, SDHB, SDHC, SDHD, SMAD4, SMARCA4, SMARCB1, SMARCE1, STK11, SUFU, TMEM127, TP53, TSC1, TSC2, VHL, WT1.    Based on [name] [name]'s personal and family history of cancer, she meets medical criteria for genetic testing. Despite that she meets criteria, she may still have an out of pocket cost. We discussed that if her out of pocket cost for testing is over $100, the laboratory should contact them to discuss self-pay prices, patient pay assistance programs, if applicable, and other billing options.  PLAN: After considering the risks,  benefits, and limitations, Ms. Riches provided informed consent to pursue genetic testing and the blood sample was sent to [facility] for analysis of the CancerNext-Expanded +RNAinsight analysis. Results should be available within approximately 2-3 weeks' time, at which point they will be disclosed by telephone to [name] [name], as will any additional recommendations warranted by these results. [name] [name] will receive a summary of her genetic counseling visit and a copy of her results once available. This information will also be available in Epic.   Lastly, we encouraged [name] [name] to remain in contact with cancer genetics annually so that we can continuously update the family history and inform her of any changes in cancer genetics and testing that may be of benefit for this family.   [name] [name] questions were answered to her satisfaction today. Our contact information was provided should additional questions or concerns arise. Thank you for the referral and allowing us  to share in the care of your patient.   Burnard Ogren, MS, [facility] Licensed, Retail [email or web address] phone: [phone]   60 minutes were spent on the date of the encounter in service to the patient including preparation, face-to-face consultation, documentation and care coordination.  The patient was seen alone.  Drs. Gudena and/or Lanny were available to discuss this case as needed.   _______________________________________________________________________ For Office Staff:  Number of people involved in session: 1 Was an Intern/ student involved with case: no

## 2024-01-22 ENCOUNTER — Telehealth: Payer: Self-pay | Admitting: Oncology

## 2024-01-22 NOTE — Telephone Encounter (Signed)
 Called [facility] and also faxed request to send slides for accession [id number] to [facility] for MSI and MLH1 Promoter Hypermethylation testing.

## 2024-01-26 ENCOUNTER — Encounter: Payer: Self-pay | Admitting: Family Medicine

## 2024-01-27 MED ORDER — LOPERAMIDE HCL 2 MG PO CAPS: 2.0000 mg | ORAL_CAPSULE | Freq: Two times a day (BID) | ORAL | 1 refills | Status: AC

## 2024-02-05 ENCOUNTER — Encounter: Payer: Self-pay | Admitting: Oncology

## 2024-02-05 NOTE — Progress Notes (Signed)
 Called [name] [name] at [facility] and canceled MSI testing on speciman ID [id number]. Testing is not needed since we have MLH1 results.

## 2024-02-11 ENCOUNTER — Ambulatory Visit: Payer: Self-pay | Admitting: Genetic Counselor

## 2024-02-11 ENCOUNTER — Telehealth: Payer: Self-pay | Admitting: Genetic Counselor

## 2024-02-11 DIAGNOSIS — Z1379 Encounter for other screening for genetic and chromosomal anomalies: Secondary | ICD-10-CM | POA: Insufficient documentation

## 2024-02-11 NOTE — Telephone Encounter (Signed)
 I spoke to [name] [name] to review results of genetic testing. Genetic testing completed with [facility] CancerNext-Expanded +RNAinsight panel. 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 We discussed that there is not enough evidence to determine if individuals with heterozygous variants in MSH3 have a risk for cancer over that of the general population. We reviewed risk to relatives and reproductive risks.   Discussed that loss of MLH1/PMS2 on IHC analysis of cancer specimen is likely a sporadic finding. Further testing revealed hypermethylation of MLH1 and no germline mutation identified that would be consistent with Lynch syndrome.   Please see counseling note for further detail on this result. Portion of result included below.

## 2024-02-13 ENCOUNTER — Encounter: Payer: Self-pay | Admitting: Genetic Counselor

## 2024-02-13 NOTE — Progress Notes (Signed)
 HPI: [name] [name] was previously seen in the [facility] [facility] clinic due to a family of cancer and concerns regarding a hereditary predisposition to cancer. Please refer to our prior cancer genetics clinic note for more information regarding [name] [name]'s medical, social and family histories, and our assessment and recommendations, at the time. [name] [name] recent genetic test results were disclosed to her, as were recommendations warranted by these results. These results and recommendations are discussed in more detail below.   Results were disclosed to [name] [name] by phone on 02/11/24.    FAMILY HISTORY:  We obtained a detailed, 4-generation family history.  Significant diagnoses are listed below: Family History  Problem Relation Age of Onset   Pancreatic cancer Mother 54   Colon cancer Father 21   Alcoholism Father    Breast cancer Sister 25   Lung cancer Sister 60   Melanoma Sister 16       back   Thyroid  disease Sister    Melanoma Sister 109   Breast cancer Sister 73   Breast cancer Maternal Aunt 45       bilateral   Heart attack Maternal Aunt 60   Cancer Maternal Uncle 67       mouth   Stomach cancer Maternal Uncle 62   Heart attack Maternal Grandmother 50   Heart disease Maternal Grandmother    Colon cancer Paternal Grandmother 31   Cancer Maternal Cousin 43       female cancer   Prostate cancer Neg Hx    Ovarian cancer Neg Hx    Endometrial cancer Neg Hx    [name] [name] is unaware of previous family history of genetic testing for hereditary cancer risks. Patient's maternal ancestors are of Irish/Scottish descent, and paternal ancestors are of Irish/Scottish descent. There is no reported Ashkenazi Jewish ancestry.       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 Since [name] [name] has only one pathogenic mutation in MSH3, she is NOT affected with MSH3-associated  polyposis, but instead is a carrier.    No other pathogenic variants were identified in the CancerNext-Expanded +RNAinsight Panel.  Report date is 02/09/24.  The CancerNext-Expanded gene panel offered by [facility] and includes sequencing, rearrangement, and RNA analysis for the following 77 genes: AIP, ALK, APC, ATM, AXIN2, BAP1, BARD1, BMPR1A, BRCA1, BRCA2, BRIP1, CDC73, CDH1, CDK4, CDKN1B, CDKN2A, CEBPA, CHEK2, CTNNA1, DDX41, DICER1, EGFR, EPCAM, ETV6, FH, FLCN, GATA2, GREM1, HOXB13, KIT, LZTR1, MAX, MBD4, MEN1, MET, MITF, MLH1, MSH2, MSH3, MSH6, MUTYH, NF1, NF2, NTHL1, PALB2, PDGFRA, PHOX2B, PMS2, POLD1, POLE, POT1, PRKAR1A, PTCH1, PTEN, RAD51C, RAD51D, RB1, RET, RPS20, RUNX1, SDHA, SDHAF2, SDHB, SDHC, SDHD, SMAD4, SMARCA4, SMARCB1, SMARCE1, STK11, SUFU, TMEM127, TP53, TSC1, TSC2, VHL, WT1.     A copy of the test report has been scanned into Epic and is located under the Molecular Pathology section of the Results Review tab. A section of the report is included below.      Testing did not identify any mutations in the genes associated with Lynch syndrome, suggesting the loss of MLH1/PMS2 identified on IHC testing of the cancer specimen occurred sporadically. Additionally, since our original counseling visit, results were received for MLH1 methylation analysis, which did detect hypermethylation of the MLH1 promater. Hypermethylation of the MLH1 promoter is a characteristic of sporadic cancer.     Of note, this result does not explain [name] [name]'s personal or family history of cancer.  Even  though a pathogenic variant related to the history was not identified, possible explanations for the cancer in the family may include: The cancers in [name] [name] and/or her family may not be hereditary but rather sporadic/familial or due to other genetic and environmental factors. There may be a gene mutation in one of these genes that current testing methods cannot detect (but that chance is small). There could be  another gene that has not yet been discovered, or that we have not yet tested, that is responsible for the cancer diagnoses in the family.  It is also possible there is a hereditary cause for the cancer in the family that [name] [name] did not inherit.   Therefore, it is important to remain in touch with cancer genetics in the future so that we can continue to offer [name] [name] the most up to date genetic testing.      MSH3 HETEROZYGOUS RECOMMENDATIONS:  We discussed that MSH3-associated polyposis is autosomal recessive, meaning that individuals with two, biallelic mutaitons (one from each parent, mutation in both copies of their MS) are at risk for more than 30 lifetime adenomas and are recommended to have more frequent colonoscopies beginning at age 34-30.   [name] [name] was found to have only one pathogenic variant in MSH3, which is insufficient to cause MSH3-associated polyposis.   MSH3 heterozygote (one mutation) cancer risks are unclear at this time.  No management changes are recommended based on this result alone.   [name] [name] should continue to follow cancer screening guidelines as recommended by their oncologist, GI and PCP.   This information is based on current understanding of the MSH3 gene and may chang in the future.     FAMILY MEMBERS  For there to be a risk of MSH3-associated polyposis in offspring, both the patient and their partner would each have to carry a pathogenic variant in MSH3. If both parents are carriers, the risk of having an affected child is 25%.  Genetic testing is available for relatives who wish to know their MSH3 status.  We will be happy to meet with any of the family members or refer them to a genetic counselor in their local area. To locate genetic counselors in other cities, individuals can visit the website of the [employer] ([email or web address]) and Financial controller for a Veterinary surgeon by zip code.    Individuals in this family might be at some increased  risk of developing cancer, over the general population risk, due to the family history of uterine, colon, pancreas, and breast cancer.  Individuals in the family should notify their providers of the family history of cancer. We recommend women in this family have a yearly mammogram beginning at age 40, or 36 years younger than the earliest onset of cancer, an annual clinical breast exam, and perform monthly breast self-exams.     We strongly encouraged [name] [name] to remain in contact with us  in cancer genetics on an annual basis so we can update [name] [name]'s personal and family histories, and inform her of advances in cancer genetics that may be of benefit for the entire family. [name] [name] knows she is also welcome to call with any questions or concerns, at any time.    Burnard Ogren, MS, [facility] Licensed, [email or web address] phone: [phone]

## 2024-02-16 ENCOUNTER — Encounter: Payer: Self-pay | Admitting: Obstetrics and Gynecology

## 2024-02-25 ENCOUNTER — Other Ambulatory Visit: Payer: Self-pay | Admitting: Family Medicine

## 2024-02-25 DIAGNOSIS — E039 Hypothyroidism, unspecified: Secondary | ICD-10-CM

## 2024-02-25 DIAGNOSIS — E785 Hyperlipidemia, unspecified: Secondary | ICD-10-CM

## 2024-03-01 ENCOUNTER — Ambulatory Visit (HOSPITAL_BASED_OUTPATIENT_CLINIC_OR_DEPARTMENT_OTHER)
Admission: RE | Admit: 2024-03-01 | Discharge: 2024-03-01 | Disposition: A | Discharge status: Home / Self Care | Source: Ambulatory Visit | Attending: Acute Care | Admitting: Acute Care

## 2024-03-01 ENCOUNTER — Encounter: Payer: Self-pay | Admitting: Family Medicine

## 2024-03-01 ENCOUNTER — Ambulatory Visit (INDEPENDENT_AMBULATORY_CARE_PROVIDER_SITE_OTHER): Payer: Self-pay | Admitting: Family Medicine

## 2024-03-01 ENCOUNTER — Ambulatory Visit (HOSPITAL_BASED_OUTPATIENT_CLINIC_OR_DEPARTMENT_OTHER): Payer: Self-pay

## 2024-03-01 VITALS — BP 118/76 | HR 64 | Temp 98.2°F | Resp 18 | Ht 60.0 in | Wt 205.4 lb

## 2024-03-01 DIAGNOSIS — E785 Hyperlipidemia, unspecified: Secondary | ICD-10-CM

## 2024-03-01 DIAGNOSIS — E1169 Type 2 diabetes mellitus with other specified complication: Secondary | ICD-10-CM

## 2024-03-01 DIAGNOSIS — Z87891 Personal history of nicotine dependence: Secondary | ICD-10-CM | POA: Insufficient documentation

## 2024-03-01 DIAGNOSIS — Z122 Encounter for screening for malignant neoplasm of respiratory organs: Secondary | ICD-10-CM | POA: Insufficient documentation

## 2024-03-01 DIAGNOSIS — Z7984 Long term (current) use of oral hypoglycemic drugs: Secondary | ICD-10-CM

## 2024-03-01 DIAGNOSIS — E039 Hypothyroidism, unspecified: Secondary | ICD-10-CM | POA: Diagnosis not present

## 2024-03-01 DIAGNOSIS — E559 Vitamin D deficiency, unspecified: Secondary | ICD-10-CM

## 2024-03-01 DIAGNOSIS — E1165 Type 2 diabetes mellitus with hyperglycemia: Secondary | ICD-10-CM | POA: Insufficient documentation

## 2024-03-01 DIAGNOSIS — J432 Centrilobular emphysema: Secondary | ICD-10-CM | POA: Insufficient documentation

## 2024-03-01 LAB — CBC WITH DIFFERENTIAL/PLATELET
Basophils Absolute: 0 K/uL (ref 0.0–0.1)
Basophils Relative: 0.9 % (ref 0.0–3.0)
Eosinophils Absolute: 0.1 K/uL (ref 0.0–0.7)
Eosinophils Relative: 2 % (ref 0.0–5.0)
HCT: 41 % (ref 36.0–46.0)
Hemoglobin: 13.4 g/dL (ref 12.0–15.0)
Lymphocytes Relative: 19.2 % (ref 12.0–46.0)
Lymphs Abs: 0.7 K/uL (ref 0.7–4.0)
MCHC: 32.8 g/dL (ref 30.0–36.0)
MCV: 92.2 fl (ref 78.0–100.0)
Monocytes Absolute: 0.3 K/uL (ref 0.1–1.0)
Monocytes Relative: 7.1 % (ref 3.0–12.0)
Neutro Abs: 2.6 K/uL (ref 1.4–7.7)
Neutrophils Relative %: 70.8 % (ref 43.0–77.0)
Platelets: 157 K/uL (ref 150.0–400.0)
RBC: 4.45 Mil/uL (ref 3.87–5.11)
RDW: 14.6 % (ref 11.5–15.5)
WBC: 3.6 K/uL — ABNORMAL LOW (ref 4.0–10.5)

## 2024-03-01 LAB — COMPREHENSIVE METABOLIC PANEL WITH GFR
ALT: 11 U/L (ref 0–35)
AST: 18 U/L (ref 0–37)
Albumin: 4 g/dL (ref 3.5–5.2)
Alkaline Phosphatase: 70 U/L (ref 39–117)
BUN: 18 mg/dL (ref 6–23)
CO2: 30 meq/L (ref 19–32)
Calcium: 8.9 mg/dL (ref 8.4–10.5)
Chloride: 104 meq/L (ref 96–112)
Creatinine, Ser: 0.63 mg/dL (ref 0.40–1.20)
GFR: 86.57 mL/min (ref >60.00)
Glucose, Bld: 82 mg/dL (ref 70–99)
Potassium: 4.4 meq/L (ref 3.5–5.1)
Sodium: 142 meq/L (ref 135–145)
Total Bilirubin: 0.4 mg/dL (ref 0.2–1.2)
Total Protein: 5.9 g/dL — ABNORMAL LOW (ref 6.0–8.3)

## 2024-03-01 LAB — LIPID PANEL
Cholesterol: 159 mg/dL (ref 0–200)
HDL: 68.2 mg/dL (ref >39.00)
LDL Cholesterol: 73 mg/dL (ref 0–99)
NonHDL: 90.48
Total CHOL/HDL Ratio: 2
Triglycerides: 85 mg/dL (ref 0.0–149.0)
VLDL: 17 mg/dL (ref 0.0–40.0)

## 2024-03-01 LAB — TSH: TSH: 2.7 u[IU]/mL (ref 0.35–5.50)

## 2024-03-01 LAB — HEMOGLOBIN A1C: Hgb A1c MFr Bld: 6 % (ref 4.6–6.5)

## 2024-03-01 NOTE — Assessment & Plan Note (Signed)
On synthroid Check labs 

## 2024-03-01 NOTE — Assessment & Plan Note (Signed)
 Tolerating statin, encouraged heart healthy diet, avoid trans fats, minimize simple carbs and saturated fats. Increase exercise as tolerated

## 2024-03-01 NOTE — Progress Notes (Signed)
 Subjective:    Patient ID: [name] [name], female    DOB: 01-27-1946, 78 y.o.   MRN: [id number]  Chief Complaint  Patient presents with   Diabetes   Hypothyroidism   Hyperlipidemia   Follow-up    -                              Patient is in today for f/u dm, chol and thyroid .   Discussed the use of AI scribe software for clinical note transcription with the patient, who gave verbal consent to proceed.  History of Present Illness [name] [name] is a 78 year old female who presents for a six-month follow-up to recheck her kidney function tests.  Previously, her kidney function tests showed abnormalities with elevated BUN and low creatinine levels. She was hospitalized in May, during which her labs were repeated and found to be within normal range. She is here to ensure that her kidney function remains stable.  She recalls that during her last visit, her kidney function tests were 'way out of whack,' which prompted further investigation.  She is currently not on any medication for her kidney function and does not report any new symptoms or concerns related to her kidney health.  She is currently navigating her healthcare coverage with Medicaid and is trying to understand her responsibilities regarding payments for visits and labs.    Past Medical History:  Diagnosis Date   Allergic rhinitis    Anxiety    Arthritis    Cataract    Depression    Diabetes mellitus without complication (HCC)    borderline   Emphysema lung (HCC)    Early stage   Endometrial polyp    Heart murmur    as a child   Hyperlipidemia    Hyperlipidemia    on simvastatin    Hypertension    Hypothyroidism    IBS (irritable bowel syndrome)    Obesity    Osteoarthritis    Seasonal allergies    Sleep apnea    no cpap, borderline diagnosis   Uterine cancer (HCC)    Vertigo 09/2022    Past Surgical History:  Procedure Laterality Date   BUNIONECTOMY  2009   Dr.Duda,  left   CARPAL TUNNEL RELEASE Right 11/28/2021   Procedure: RIGHT CARPAL TUNNEL RELEASE;  Surgeon: Romona Harari, MD;  Location: [facility];  Service: Orthopedics;  Laterality: Right;   EYE SURGERY     HYSTEROSCOPY WITH D & C  04/24/2011   Procedure: DILATATION AND CURETTAGE (D&C) /HYSTEROSCOPY;  Surgeon: Shanda SHAUNNA Muscat, MD;  Location: [facility];  Service: Gynecology;  Laterality: N/A;   INJECTION, FOR SENTINEL LYMPH NODE IDENTIFICATION N/A 12/03/2023   Procedure: INJECTION, FOR SENTINEL LYMPH NODE IDENTIFICATION;  Surgeon: Viktoria Comer SAUNDERS, MD;  Location: [facility];  Service: Gynecology;  Laterality: N/A;   LYMPH NODE BIOPSY N/A 12/03/2023   Procedure: LYMPH NODE BIOPSY;  Surgeon: Viktoria Comer SAUNDERS, MD;  Location: [facility];  Service: Gynecology;  Laterality: N/A;  POSSIBLE PELVIC LYMPHADENECTOMY, POSSIBLE LAPAROTOMY   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   Dr Carlie FESTER FASCIA SURGERY  1994   Dr Beverley   POLYPECTOMY  04/24/2011   Procedure: POLYPECTOMY;  Surgeon: Shanda SHAUNNA Muscat, MD;  Location: [facility];  Service: Gynecology;  Laterality: N/A;   ROBOTIC ASSISTED TOTAL HYSTERECTOMY WITH BILATERAL SALPINGO OOPHERECTOMY Bilateral 12/03/2023   Procedure: HYSTERECTOMY, TOTAL, ROBOT-ASSISTED, LAPAROSCOPIC,  WITH BILATERAL SALPINGO-OOPHORECTOMY;  Surgeon: Viktoria Comer SAUNDERS, MD;  Location: [facility];  Service: Gynecology;  Laterality: Bilateral;   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   Procedure: TOTAL KNEE ARTHROPLASTY;  Surgeon: Melodi Lerner, MD;  Location: [facility];  Service: Orthopedics;  Laterality: Right;   TOTAL KNEE ARTHROPLASTY Left 10/07/2022   Procedure: TOTAL KNEE ARTHROPLASTY;  Surgeon: Melodi Lerner, MD;  Location: [facility];  Service: Orthopedics;  Laterality: Left;    Family History  Problem Relation Age of Onset   Pancreatic cancer Mother 30   Colon cancer Father 31   Alcoholism Father    Breast cancer Sister 40   Lung cancer Sister 59   Melanoma Sister 39        back   Thyroid  disease Sister    Melanoma Sister 33   Breast cancer Sister 42   Breast cancer Maternal Aunt 45       bilateral   Heart attack Maternal Aunt 60   Cancer Maternal Uncle 67       mouth   Stomach cancer Maternal Uncle 62   Heart attack Maternal Grandmother 50   Heart disease Maternal Grandmother    Colon cancer Paternal Grandmother 59   Cancer Maternal Cousin 56       female cancer   Prostate cancer Neg Hx    Ovarian cancer Neg Hx    Endometrial cancer Neg Hx     Social History   Socioeconomic History   Marital status: Widowed    Spouse name: Emmilee Reamer   Number of children: 0   Years of education: Not on file   Highest education level: Master's degree (e.g., MA, MS, MEng, MEd, MSW, MBA)  Occupational History   Occupation: Daycare p/t    Comment: retired  Tobacco Use   Smoking status: Former    Current packs/day: 0.00    Average packs/day: 2.0 packs/day for 42.0 years (84.0 ttl pk-yrs)    Types: Cigarettes    Start date: 07/28/1970    Quit date: 07/28/2012    Years since quitting: 11.6    Passive exposure: Never   Smokeless tobacco: Never  Vaping Use   Vaping status: Former  Substance and Sexual Activity   Alcohol  use: Yes    Comment: rare  2-3 x a year   Drug use: Never   Sexual activity: Yes    Partners: Male    Birth control/protection: Post-menopausal  Other Topics Concern   Not on file  Social History Narrative   Regular exercise- no now       Lives alone   Caffeine- 2 teas daily   Social Drivers of Health   Financial Resource Strain: Low Risk  (02/23/2024)   Overall Financial Resource Strain (CARDIA)    Difficulty of Paying Living Expenses: Not hard at all  Food Insecurity: No Food Insecurity (02/23/2024)   Hunger Vital Sign    Worried About Running Out of Food in the Last Year: Never true    Ran Out of Food in the Last Year: Never true  Transportation Needs: No Transportation Needs (02/23/2024)   PRAPARE - Doctor, general practice (Medical): No    Lack of Transportation (Non-Medical): No  Physical Activity: Insufficiently Active (02/23/2024)   Exercise Vital Sign    Days of Exercise per Week: 1 day    Minutes of Exercise per Session: 10 min  Stress: Stress Concern Present (02/23/2024)   [employer] of Occupational Health - Occupational Stress Questionnaire  Feeling of Stress: Rather much  Social Connections: Moderately Isolated (02/23/2024)   Social Connection and Isolation Panel    Frequency of Communication with Friends and Family: Three times a week    Frequency of Social Gatherings with Friends and Family: Once a week    Attends Religious Services: 1 to 4 times per year    Active Member of [employer] or Organizations: No    Attends Banker Meetings: Not on file    Marital Status: Widowed  Intimate Partner Violence: Not At Risk (10/07/2022)   Humiliation, Afraid, Rape, and Kick questionnaire    Fear of Current or Ex-Partner: No    Emotionally Abused: No    Physically Abused: No    Sexually Abused: No    Outpatient Medications Prior to Visit  Medication Sig Dispense Refill   acetaminophen  (TYLENOL ) 500 MG tablet Take 500-1,000 mg by mouth every 6 (six) hours as needed (pain).     Biotin 5000 MCG TABS Take 10,000 mcg by mouth every evening.     Collagen-Vitamin C-Biotin (COLLAGEN 1500/C PO) Take 1 Scoop by mouth in the morning. With coffee     cycloSPORINE  (RESTASIS ) 0.05 % ophthalmic emulsion Place 1 drop into both eyes 2 (two) times daily.     diphenhydramine -acetaminophen  (TYLENOL  PM) 25-500 MG TABS tablet Take 1 tablet by mouth at bedtime as needed (pain/sleep).     ibuprofen  (ADVIL ) 200 MG tablet Take 200-400 mg by mouth every 8 (eight) hours as needed (pain.).     Ibuprofen -diphenhydrAMINE  HCl (ADVIL  PM) 200-25 MG CAPS Take 1 tablet by mouth at bedtime as needed (pain/sleep).     levothyroxine  (SYNTHROID ) 125 MCG tablet TAKE 1 TABLET DAILY BEFORE BREAKFAST 90 tablet 3    loperamide  (IMODIUM ) 2 MG capsule Take 1 capsule (2 mg total) by mouth 2 (two) times daily. 180 capsule 1   Melatonin 3 MG CAPS Take 3 mg by mouth at bedtime.     metFORMIN  (GLUCOPHAGE ) 500 MG tablet TAKE 1 TABLET DAILY WITH BREAKFAST 90 tablet 3   Multiple Vitamins-Minerals (CENTRUM SILVER) tablet Take 1 tablet by mouth every evening.     Propylene Glycol (SYSTANE COMPLETE) 0.6 % SOLN Place 1 drop into both eyes 4 (four) times daily as needed (dry/irritated eyes.).     sertraline  (ZOLOFT ) 100 MG tablet 1 1/2 po every day (Patient taking differently: Take 100 mg by mouth in the morning.) 135 tablet 3   simvastatin  (ZOCOR ) 20 MG tablet TAKE 1 TABLET AT BEDTIME 90 tablet 3   Vitamin D , Ergocalciferol , (DRISDOL ) 1.25 MG (50000 UNIT) CAPS capsule Take 1 capsule (50,000 Units total) by mouth every 7 (seven) days. (Patient taking differently: Take 50,000 Units by mouth every Wednesday.) 13 capsule 3   oxyCODONE  (OXY IR/ROXICODONE ) 5 MG immediate release tablet Take 1 tablet (5 mg total) by mouth every 4 (four) hours as needed for severe pain (pain score 7-10). For AFTER surgery only, do not take and drive 10 tablet 0   senna-docusate (SENOKOT-S) 8.6-50 MG tablet Take 2 tablets by mouth at bedtime. For AFTER surgery, do not take if having diarrhea 30 tablet 0   No facility-administered medications prior to visit.    Allergies  Allergen Reactions   Tape Other (See Comments)    Red, swollen, itching    Review of Systems  Constitutional:  Negative for chills, fever and malaise/fatigue.  HENT:  Negative for congestion and hearing loss.   Eyes:  Negative for discharge.  Respiratory:  Negative for cough,  sputum production and shortness of breath.   Cardiovascular:  Negative for chest pain, palpitations and leg swelling.  Gastrointestinal:  Negative for abdominal pain, blood in stool, constipation, diarrhea, heartburn, nausea and vomiting.  Genitourinary:  Negative for dysuria, frequency, hematuria and  urgency.  Musculoskeletal:  Negative for back pain, falls and myalgias.  Skin:  Negative for rash.  Neurological:  Negative for dizziness, sensory change, loss of consciousness, weakness and headaches.  Endo/Heme/Allergies:  Negative for environmental allergies. Does not bruise/bleed easily.  Psychiatric/Behavioral:  Negative for depression and suicidal ideas. The patient is not nervous/anxious and does not have insomnia.        Objective:    Physical Exam Vitals and nursing note reviewed.  Constitutional:      General: She is not in acute distress.    Appearance: Normal appearance. She is well-developed.  HENT:     Head: Normocephalic and atraumatic.  Eyes:     General: No scleral icterus.       Right eye: No discharge.        Left eye: No discharge.  Cardiovascular:     Rate and Rhythm: Normal rate and regular rhythm.     Heart sounds: No murmur heard. Pulmonary:     Effort: Pulmonary effort is normal. No respiratory distress.     Breath sounds: Normal breath sounds.  Musculoskeletal:        General: Normal range of motion.     Cervical back: Normal range of motion and neck supple.     Right lower leg: No edema.     Left lower leg: No edema.  Skin:    General: Skin is warm and dry.  Neurological:     Mental Status: She is alert and oriented to person, place, and time.  Psychiatric:        Mood and Affect: Mood normal.        Behavior: Behavior normal.        Thought Content: Thought content normal.        Judgment: Judgment normal.     BP 118/76 (BP Location: Right Arm, Patient Position: Sitting, Cuff Size: Large)   Pulse 64   Temp 98.2 F (36.8 C) (Oral)   Resp 18   Ht 5' (1.524 m)   Wt 205 lb 6.4 oz (93.2 kg)   SpO2 99%   BMI 40.11 kg/m  Wt Readings from Last 3 Encounters:  03/01/24 205 lb 6.4 oz (93.2 kg)  12/25/23 203 lb 12.8 oz (92.4 kg)  12/03/23 199 lb (90.3 kg)    Diabetic Foot Exam - Simple   No data filed    Lab Results  Component Value  Date   WBC 4.3 11/28/2023   HGB 13.7 11/28/2023   HCT 42.3 11/28/2023   PLT 155 11/28/2023   GLUCOSE 97 11/28/2023   CHOL 181 09/02/2023   TRIG 86 09/02/2023   HDL 86 09/02/2023   LDLDIRECT 129.1 06/13/2011   LDLCALC 78 09/02/2023   ALT 14 11/28/2023   AST 25 11/28/2023   NA 137 11/28/2023   K 3.9 11/28/2023   CL 103 11/28/2023   CREATININE 0.63 11/28/2023   BUN 20 11/28/2023   CO2 25 11/28/2023   TSH 2.99 09/02/2023   HGBA1C 5.1 11/28/2023   MICROALBUR 0.3 09/02/2023    Lab Results  Component Value Date   TSH 2.99 09/02/2023   Lab Results  Component Value Date   WBC 4.3 11/28/2023   HGB 13.7 11/28/2023   HCT  42.3 11/28/2023   MCV 94.8 11/28/2023   PLT 155 11/28/2023   Lab Results  Component Value Date   NA 137 11/28/2023   K 3.9 11/28/2023   CO2 25 11/28/2023   GLUCOSE 97 11/28/2023   BUN 20 11/28/2023   CREATININE 0.63 11/28/2023   BILITOT 0.7 11/28/2023   ALKPHOS 69 11/28/2023   AST 25 11/28/2023   ALT 14 11/28/2023   PROT 6.5 11/28/2023   ALBUMIN 4.2 11/28/2023   CALCIUM 9.1 11/28/2023   ANIONGAP 9 11/28/2023   GFR 88.57 03/03/2023   Lab Results  Component Value Date   CHOL 181 09/02/2023   Lab Results  Component Value Date   HDL 86 09/02/2023   Lab Results  Component Value Date   LDLCALC 78 09/02/2023   Lab Results  Component Value Date   TRIG 86 09/02/2023   Lab Results  Component Value Date   CHOLHDL 2.1 09/02/2023   Lab Results  Component Value Date   HGBA1C 5.1 11/28/2023       Assessment & Plan:  Hyperlipidemia, unspecified hyperlipidemia type -     CBC with Differential/Platelet -     Comprehensive metabolic panel with GFR -     Lipid panel  Hypothyroidism, unspecified type Assessment & Plan: On synthroid  Check labs   Orders: -     CBC with Differential/Platelet -     TSH  Vitamin D  deficiency  Type 2 diabetes mellitus with hyperglycemia, without long-term current use of insulin  (HCC) Assessment &  Plan: Check labs  hgba1c to be checked , minimize simple carbs. Increase exercise as tolerated. Continue current meds   Orders: -     CBC with Differential/Platelet -     Comprehensive metabolic panel with GFR -     Hemoglobin A1c  Centrilobular emphysema (HCC) Assessment & Plan: Stable  Seen by pulmonary   Morbid obesity (HCC)  Hyperlipidemia associated with type 2 diabetes mellitus (HCC) Assessment & Plan: Tolerating statin, encouraged heart healthy diet, avoid trans fats, minimize simple carbs and saturated fats. Increase exercise as tolerated    Assessment and Plan Assessment & Plan Abnormal kidney function tests   Previous tests showed elevated BUN and low creatinine levels, potentially indicating dehydration or other kidney issues. GFR was within normal range. The abnormal results may have been due to insufficient water  intake, as she had an IV at the time of the previous test. Repeat kidney function tests to assess current status.    Vinessa Macconnell R Lowne Chase, DO

## 2024-03-01 NOTE — Assessment & Plan Note (Signed)
 Stable  Seen by pulmonary

## 2024-03-01 NOTE — Assessment & Plan Note (Signed)
Check labs  hgba1c to be checked, minimize simple carbs. Increase exercise as tolerated. Continue current meds  

## 2024-03-14 ENCOUNTER — Ambulatory Visit: Payer: Self-pay | Admitting: Family Medicine

## 2024-03-17 ENCOUNTER — Telehealth: Payer: Self-pay

## 2024-03-17 ENCOUNTER — Other Ambulatory Visit: Payer: Self-pay

## 2024-03-17 DIAGNOSIS — Z122 Encounter for screening for malignant neoplasm of respiratory organs: Secondary | ICD-10-CM

## 2024-03-17 DIAGNOSIS — Z87891 Personal history of nicotine dependence: Secondary | ICD-10-CM

## 2024-03-17 NOTE — Telephone Encounter (Signed)
 Called and spoke with patient. Reviewed recent lung CT results. She will complete an annual Lung CT again next year. Patient declines an appt with one of our ILD specialists at this time. Advises she will follow up with PCP and call our office back if she changes her mind. She is on statin therapy and takes it daily. Annual Lung CT order placed. NFN.   IMPRESSION: 1. Lung-RADS 2, benign appearance or behavior. Continue annual screening with low-dose chest CT without contrast in 12 months. 2. Bibasilar interstitial lung abnormality. 3.  Aortic atherosclerosis (ICD10-I70.0). 4.  Emphysema (ICD10-J43.9).

## 2024-03-30 ENCOUNTER — Inpatient Hospital Stay (HOSPITAL_BASED_OUTPATIENT_CLINIC_OR_DEPARTMENT_OTHER): Admission: RE | Admit: 2024-03-30 | Payer: Self-pay | Source: Ambulatory Visit

## 2024-04-12 ENCOUNTER — Ambulatory Visit (HOSPITAL_BASED_OUTPATIENT_CLINIC_OR_DEPARTMENT_OTHER)
Admission: RE | Admit: 2024-04-12 | Discharge: 2024-04-12 | Disposition: A | Discharge status: Home / Self Care | Payer: Self-pay | Source: Ambulatory Visit | Attending: Family Medicine | Admitting: Family Medicine

## 2024-04-12 ENCOUNTER — Encounter (HOSPITAL_BASED_OUTPATIENT_CLINIC_OR_DEPARTMENT_OTHER): Payer: Self-pay

## 2024-04-12 DIAGNOSIS — Z1231 Encounter for screening mammogram for malignant neoplasm of breast: Secondary | ICD-10-CM | POA: Diagnosis present

## 2024-05-07 ENCOUNTER — Encounter: Payer: Self-pay | Admitting: Family Medicine

## 2024-05-07 DIAGNOSIS — E559 Vitamin D deficiency, unspecified: Secondary | ICD-10-CM

## 2024-05-07 DIAGNOSIS — F418 Other specified anxiety disorders: Secondary | ICD-10-CM

## 2024-05-07 MED ORDER — VITAMIN D (ERGOCALCIFEROL) 1.25 MG (50000 UNIT) PO CAPS: 50000.0000 [IU] | ORAL_CAPSULE | ORAL | 3 refills | Status: AC

## 2024-05-07 MED ORDER — SERTRALINE HCL 100 MG PO TABS: 100.0000 mg | ORAL_TABLET | Freq: Every morning | ORAL | 1 refills | Status: AC

## 2024-05-07 NOTE — Telephone Encounter (Signed)
 Okay to refill cycloSPORINE  (RESTASIS )? Med under historical provider

## 2024-06-24 ENCOUNTER — Ambulatory Visit: Payer: Self-pay | Attending: Gynecologic Oncology | Admitting: Gynecologic Oncology

## 2024-06-24 ENCOUNTER — Encounter: Payer: Self-pay | Admitting: Gynecologic Oncology

## 2024-06-24 VITALS — BP 122/70 | HR 74 | Temp 98.3°F | Resp 19 | Wt 205.0 lb

## 2024-06-24 DIAGNOSIS — Z90722 Acquired absence of ovaries, bilateral: Secondary | ICD-10-CM | POA: Insufficient documentation

## 2024-06-24 DIAGNOSIS — Z90710 Acquired absence of both cervix and uterus: Secondary | ICD-10-CM | POA: Diagnosis not present

## 2024-06-24 DIAGNOSIS — Z8542 Personal history of malignant neoplasm of other parts of uterus: Secondary | ICD-10-CM | POA: Diagnosis not present

## 2024-06-24 DIAGNOSIS — N3941 Urge incontinence: Secondary | ICD-10-CM | POA: Diagnosis not present

## 2024-06-24 DIAGNOSIS — Z9079 Acquired absence of other genital organ(s): Secondary | ICD-10-CM | POA: Diagnosis not present

## 2024-06-24 DIAGNOSIS — C541 Malignant neoplasm of endometrium: Secondary | ICD-10-CM

## 2024-06-24 NOTE — Progress Notes (Signed)
 Gynecologic Oncology Return Clinic Visit  06/24/2024  Reason for Visit: follow-up   Treatment History: Developed vaginal bleeding in February.  Describes this as pink spotting initially that then increased in quantity.  Denies any heavy bleeding ever. 09/26/23: Pelvic ultrasound - uterus 6 x 3.8 x 2.7 cm with endometrium measuring 13.3 mm.  11/06/23: D&C. 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  She endorses about 2 weeks of gradually decreasing bleeding after her biopsy.   12/03/23: Robotic-assisted laparoscopic total hysterectomy with bilateral salpingo-oophorectomy, SLN biopsy bilaterally, cystoscopy, repair vaginal laceration.   Interval History: Doing well.  Denies any vaginal bleeding, abdominal or pelvic pain.  Has some urge urinary incontinence since surgery, not very bothered at this time.  Bowel function is at baseline.  Past Medical/Surgical History: Past Medical History:  Diagnosis Date   Allergic rhinitis    Anxiety    Arthritis    Cataract    Depression    Diabetes mellitus without complication (HCC)    borderline   Emphysema lung (HCC)    Early stage   Endometrial polyp    Heart murmur    as a child   Hyperlipidemia    Hyperlipidemia    on simvastatin    Hypertension    Hypothyroidism    IBS (irritable bowel syndrome)    Obesity    Osteoarthritis    Seasonal allergies    Sleep apnea    no cpap, borderline diagnosis   Uterine cancer (HCC)    Vertigo 09/2022    Past Surgical History:  Procedure Laterality Date   BUNIONECTOMY  2009   Dr.Duda, left   CARPAL TUNNEL RELEASE Right 11/28/2021   Procedure: RIGHT CARPAL TUNNEL RELEASE;  Surgeon: Romona Harari, MD;  Location: [facility];  Service: Orthopedics;  Laterality: Right;   EYE SURGERY     HYSTEROSCOPY WITH D & C  04/24/2011   Procedure: DILATATION AND CURETTAGE (D&C) /HYSTEROSCOPY;   Surgeon: Shanda SHAUNNA Muscat, MD;  Location: [facility];  Service: Gynecology;  Laterality: N/A;   INJECTION, FOR SENTINEL LYMPH NODE IDENTIFICATION N/A 12/03/2023   Procedure: INJECTION, FOR SENTINEL LYMPH NODE IDENTIFICATION;  Surgeon: Viktoria Comer SAUNDERS, MD;  Location: [facility];  Service: Gynecology;  Laterality: N/A;   LYMPH NODE BIOPSY N/A 12/03/2023   Procedure: LYMPH NODE BIOPSY;  Surgeon: Viktoria Comer SAUNDERS, MD;  Location: [facility];  Service: Gynecology;  Laterality: N/A;  POSSIBLE PELVIC LYMPHADENECTOMY, POSSIBLE LAPAROTOMY   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   Dr Carlie FESTER FASCIA SURGERY  1994   Dr Beverley   POLYPECTOMY  04/24/2011   Procedure: POLYPECTOMY;  Surgeon: Shanda SHAUNNA Muscat, MD;  Location: [facility];  Service: Gynecology;  Laterality: N/A;   ROBOTIC ASSISTED TOTAL HYSTERECTOMY WITH BILATERAL SALPINGO OOPHERECTOMY Bilateral 12/03/2023   Procedure: HYSTERECTOMY, TOTAL, ROBOT-ASSISTED, LAPAROSCOPIC, WITH BILATERAL SALPINGO-OOPHORECTOMY;  Surgeon: Viktoria Comer SAUNDERS, MD;  Location: [facility];  Service: Gynecology;  Laterality: Bilateral;   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   Procedure: TOTAL KNEE ARTHROPLASTY;  Surgeon: Melodi Lerner, MD;  Location: [facility];  Service: Orthopedics;  Laterality: Right;   TOTAL KNEE ARTHROPLASTY Left 10/07/2022   Procedure: TOTAL KNEE ARTHROPLASTY;  Surgeon: Melodi Lerner, MD;  Location: [facility];  Service: Orthopedics;  Laterality: Left;    Family History  Problem Relation Age of Onset   Pancreatic cancer Mother 86   Colon cancer Father 30   Alcoholism  Father    Breast cancer Sister 70   Lung cancer Sister 68   Melanoma Sister 36       back   Thyroid  disease Sister    Melanoma Sister 53   Breast cancer Sister 57   Breast cancer Maternal Aunt 45       bilateral   Heart attack Maternal Aunt 60   Cancer Maternal Uncle 67       mouth   Stomach cancer Maternal Uncle 62   Heart attack Maternal Grandmother 50   Heart disease Maternal  Grandmother    Colon cancer Paternal Grandmother 34   Cancer Maternal Cousin 1       female cancer   Prostate cancer Neg Hx    Ovarian cancer Neg Hx    Endometrial cancer Neg Hx     Social History   Socioeconomic History   Marital status: Widowed    Spouse name: Kleigh Hoelzer   Number of children: 0   Years of education: Not on file   Highest education level: Master's degree (e.g., MA, MS, MEng, MEd, MSW, MBA)  Occupational History   Occupation: Daycare p/t    Comment: retired  Tobacco Use   Smoking status: Former    Current packs/day: 0.00    Average packs/day: 2.0 packs/day for 42.0 years (84.0 ttl pk-yrs)    Types: Cigarettes    Start date: 07/28/1970    Quit date: 07/28/2012    Years since quitting: 11.9    Passive exposure: Never   Smokeless tobacco: Never  Vaping Use   Vaping status: Former  Substance and Sexual Activity   Alcohol  use: Yes    Comment: rare  2-3 x a year   Drug use: Never   Sexual activity: Yes    Partners: Male    Birth control/protection: Post-menopausal  Other Topics Concern   Not on file  Social History Narrative   Regular exercise- no now       Lives alone   Caffeine- 2 teas daily   Social Drivers of Health   Tobacco Use: Medium Risk (06/24/2024)   Patient History    Smoking Tobacco Use: Former    Smokeless Tobacco Use: Never    Passive Exposure: Never  Physicist, Medical Strain: Low Risk (02/23/2024)   Overall Financial Resource Strain (CARDIA)    Difficulty of Paying Living Expenses: Not hard at all  Food Insecurity: No Food Insecurity (02/23/2024)   Epic    Worried About Programme Researcher, Broadcasting/film/video in the Last Year: Never true    Ran Out of Food in the Last Year: Never true  Transportation Needs: No Transportation Needs (02/23/2024)   Epic    Lack of Transportation (Medical): No    Lack of Transportation (Non-Medical): No  Physical Activity: Insufficiently Active (02/23/2024)   Exercise Vital Sign    Days of Exercise per Week: 1 day     Minutes of Exercise per Session: 10 min  Stress: Stress Concern Present (02/23/2024)   [employer] of Occupational Health - Occupational Stress Questionnaire    Feeling of Stress: Rather much  Social Connections: Moderately Isolated (02/23/2024)   Social Connection and Isolation Panel    Frequency of Communication with Friends and Family: Three times a week    Frequency of Social Gatherings with Friends and Family: Once a week    Attends Religious Services: 1 to 4 times per year    Active Member of [employer] or Organizations: No    Attends Club or  Organization Meetings: Not on file    Marital Status: Widowed  Depression (PHQ2-9): Medium Risk (09/02/2023)   Depression (PHQ2-9)    PHQ-2 Score: 8  Alcohol  Screen: Low Risk (02/23/2024)   Alcohol  Screen    Last Alcohol  Screening Score (AUDIT): 1  Housing: Low Risk (02/23/2024)   Epic    Unable to Pay for Housing in the Last Year: No    Number of Times Moved in the Last Year: 0    Homeless in the Last Year: No  Utilities: Not At Risk (10/07/2022)   [facility] Utilities    Threatened with loss of utilities: No  Health Literacy: Not on file    Current Medications: Current Medications[1]  Review of Systems: + incontinence, depression Denies appetite changes, fevers, chills, fatigue, unexplained weight changes. Denies hearing loss, neck lumps or masses, mouth sores, ringing in ears or voice changes. Denies cough or wheezing.  Denies shortness of breath. Denies chest pain or palpitations. Denies leg swelling. Denies abdominal distention, pain, blood in stools, constipation, diarrhea, nausea, vomiting, or early satiety. Denies pain with intercourse, dysuria, frequency, hematuria. Denies hot flashes, pelvic pain, vaginal bleeding or vaginal discharge.   Denies joint pain, back pain or muscle pain/cramps. Denies itching, rash, or wounds. Denies dizziness, headaches, numbness or seizures. Denies swollen lymph nodes or glands, denies easy bruising  or bleeding. Denies anxiety, confusion, or decreased concentration.  Physical Exam: BP (!) 158/75 (BP Location: Left Arm, Patient Position: Sitting)   Pulse 74   Temp 98.3 F (36.8 C) (Oral)   Resp 19   Wt 205 lb (93 kg)   SpO2 99%   BMI 40.04 kg/m  General: Alert, oriented, no acute distress. HEENT: Posterior oropharynx clear, sclera anicteric. Chest: Clear to auscultation bilaterally.  No wheezes or rhonchi. Cardiovascular: Regular rate and rhythm, no murmurs. Abdomen: Obese, soft, nontender.  Normoactive bowel sounds.  No masses or hepatosplenomegaly appreciated.  Well-healed incisions. Extremities: Grossly normal range of motion.  Warm, well perfused.  No edema bilaterally. Skin: No rashes or lesions noted. Lymphatics: No cervical, supraclavicular, or inguinal adenopathy. GU: Normal appearing external genitalia without erythema, excoriation, or lesions.  Speculum exam reveals moderately atrophic vaginal mucosa, some narrowing at the vaginal apex, cuff is normal in appearance without lesions.  Bimanual exam reveals cuff intact, no nodularity or masses appreciated.  Rectovaginal exam deferred.  Laboratory & Radiologic Studies: None new  Assessment & Plan: RAYME BUI is a 78 y.o. 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  Did not meet HIR criteria. 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    Doing well, NED on exam.  Gust urinary symptoms.  Reviewed medication treatment options for urge urinary incontinence.  Offered referral to urogynecology.  The patient would like to think about this and will let me know if symptoms worsen or she would like a referral.   Per NCCN surveillance recommendations, we will plan on visits every 6 months alternating between my office and her OB/GYN.  We will see her for follow-up in 12 months.  We discussed signs and symptoms that should prompt a phone call between  visits.  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  Cancer risk unknown, no specific recommendations at this time.  Routine cancer screening guidelines recommended.  22 minutes of total time was spent for this patient encounter, including preparation, face-to-face counseling with the patient and coordination of care, and documentation of the  encounter.  Comer Dollar, MD  Division of Gynecologic Oncology  Department of Obstetrics and Gynecology  [facility]      [1]  Current Outpatient Medications:    acetaminophen  (TYLENOL ) 500 MG tablet, Take 500-1,000 mg by mouth every 6 (six) hours as needed (pain)., Disp: , Rfl:    Biotin 5000 MCG TABS, Take 10,000 mcg by mouth every evening., Disp: , Rfl:    Collagen-Vitamin C-Biotin (COLLAGEN 1500/C PO), Take 1 Scoop by mouth in the morning. With coffee, Disp: , Rfl:    cycloSPORINE  (RESTASIS ) 0.05 % ophthalmic emulsion, Place 1 drop into both eyes 2 (two) times daily., Disp: , Rfl:    diphenhydramine -acetaminophen  (TYLENOL  PM) 25-500 MG TABS tablet, Take 1 tablet by mouth at bedtime as needed (pain/sleep)., Disp: , Rfl:    ibuprofen  (ADVIL ) 200 MG tablet, Take 200-400 mg by mouth every 8 (eight) hours as needed (pain.)., Disp: , Rfl:    Ibuprofen -diphenhydrAMINE  HCl (ADVIL  PM) 200-25 MG CAPS, Take 1 tablet by mouth at bedtime as needed (pain/sleep)., Disp: , Rfl:    levothyroxine  (SYNTHROID ) 125 MCG tablet, TAKE 1 TABLET DAILY BEFORE BREAKFAST, Disp: 90 tablet, Rfl: 3   loperamide  (IMODIUM ) 2 MG capsule, Take 1 capsule (2 mg total) by mouth 2 (two) times daily., Disp: 180 capsule, Rfl: 1   Melatonin 3 MG CAPS, Take 3 mg by mouth at bedtime., Disp: , Rfl:    metFORMIN  (GLUCOPHAGE ) 500 MG tablet, TAKE 1 TABLET DAILY WITH BREAKFAST, Disp: 90 tablet, Rfl: 3   Multiple Vitamins-Minerals (CENTRUM SILVER) tablet, Take 1 tablet by mouth every evening., Disp: , Rfl:    Propylene Glycol (SYSTANE COMPLETE) 0.6 % SOLN, Place 1  drop into both eyes 4 (four) times daily as needed (dry/irritated eyes.)., Disp: , Rfl:    sertraline  (ZOLOFT ) 100 MG tablet, Take 1 tablet (100 mg total) by mouth in the morning., Disp: 90 tablet, Rfl: 1   simvastatin  (ZOCOR ) 20 MG tablet, TAKE 1 TABLET AT BEDTIME, Disp: 90 tablet, Rfl: 3   Vitamin D , Ergocalciferol , (DRISDOL ) 1.25 MG (50000 UNIT) CAPS capsule, Take 1 capsule (50,000 Units total) by mouth every 7 (seven) days., Disp: 13 capsule, Rfl: 3

## 2024-06-24 NOTE — Patient Instructions (Signed)
 It was good to see you today.  I do not see or feel any evidence of cancer recurrence on your exam.  We will see you for follow-up in 12 months.  Please be sure to see your OB/GYN in 6 months.  As always, if you develop any new and concerning symptoms before your next visit, please call to see me sooner.

## 2024-07-12 ENCOUNTER — Encounter: Payer: Self-pay | Admitting: Family Medicine

## 2024-07-12 DIAGNOSIS — M21611 Bunion of right foot: Secondary | ICD-10-CM

## 2024-07-19 NOTE — Addendum Note (Signed)
 Addended by: ELOUISE POWELL HERO on: 07/19/2024 04:08 PM   Modules accepted: Orders

## 2024-07-26 ENCOUNTER — Ambulatory Visit (INDEPENDENT_AMBULATORY_CARE_PROVIDER_SITE_OTHER): Admitting: Podiatry

## 2024-07-26 ENCOUNTER — Encounter: Payer: Self-pay | Admitting: Podiatry

## 2024-07-26 DIAGNOSIS — L600 Ingrowing nail: Secondary | ICD-10-CM

## 2024-07-26 MED ORDER — MUPIROCIN 2 % EX OINT: 1.0000 | TOPICAL_OINTMENT | Freq: Two times a day (BID) | CUTANEOUS | 2 refills | Status: AC

## 2024-07-26 NOTE — Patient Instructions (Signed)
 Place 1/4 cup of epsom salts in a quart of warm tap water .  Submerge your foot or feet in the solution and soak for 20 minutes.  This soak should be done twice a day.  Next, remove your foot or feet from solution, blot dry the affected area. Apply ointment and cover with a bandaid if instructed by your doctor.   IF YOUR SKIN BECOMES IRRITATED WHILE USING THESE INSTRUCTIONS, IT IS OKAY TO SWITCH TO  WHITE VINEGAR AND WATER .  As another alternative soak, you may use antibacterial soap and water .  Monitor for any signs/symptoms of infection. Call the office immediately if any occur or go directly to the emergency room. Call with any questions/concerns.   For the first 5 to 7 days, you can apply small amount of antibacterial ointment to the ingrown toenail site.  After that time discontinue the use of any ointment.  Continue to soak and bandage the toe until dry scab forms.

## 2024-07-26 NOTE — Progress Notes (Unsigned)
" °   °  °  Subjective:  Patient ID: [name] [name], female    DOB: 1946/06/24,  MRN: [id number]  [name] [name] presents to clinic today for:  Chief Complaint  Patient presents with   Nail Problem    R great to nail top lateral border sore. X 3 weeks. Hurts when wearing shoes Minimal redness. No drainage.  Patient comes in for above complaint.  Relates that this first occurred after a pedicure.  Pain has been mildly improving over the last 2 weeks.  Does deal with some inflammation around the cuticle.  She is diabetic.  Well-controlled.  Last A1c 6.0 back in August.  PCP is Antonio Meth, [place] R, DO.  Allergies[1]  Review of Systems: Negative except as noted in the HPI.  Objective:  There were no vitals filed for this visit.  ROY SNUFFER is a pleasant 79 y.o. female in NAD. AAO x 3.  Vascular Examination: Capillary refill time is 3-5 seconds to toes bilateral. Palpable pedal pulses b/l LE. Digital hair present b/l. No pedal edema b/l. Skin temperature gradient WNL b/l. No varicosities b/l. No cyanosis or clubbing noted b/l.   Dermatological Examination: There is incurvation of the right hallux lateral nail border.  There is pain on palpation of the affected nail border.  Inflammation noted about the cuticle.  No significant erythema or edema.  Neurological Examination: Protective sensation intact with Semmes-Weinstein 10 gram monofilament b/l LE. Vibratory sensation intact b/l LE.     Latest Ref Rng & Units 03/01/2024   11:51 AM 11/28/2023    8:51 AM 09/02/2023    2:25 PM  Hemoglobin A1C  Hemoglobin-A1c 4.6 - 6.5 % 6.0  5.1  5.5      Assessment/Plan: 1. Ingrown toenail of right foot     No orders of the defined types were placed in this encounter.   Discussed patient's condition today.  After obtaining patient consent, the *** was anesthetized with a 50:50 mixture of 1% lidocaine  plain and 0.5% bupivacaine  plain for a total of 3cc's administered.  Upon confirmation of  anesthesia, a freer elevator was utilized to free the *** nail border from the nail bed.  The nail border was then avulsed proximal to the eponychium and removed in toto.  The area was inspected for any remaining spicules.  A chemical matrixectomy was performed with NaOH and neutralized with acetic acid solution.  Antibiotic ointment and a DSD were applied, followed by a Coban dressing.  Patient tolerated the anesthetic and procedure well and will f/u in 2-3 weeks for recheck.  Patient given post-procedure instructions for daily 20-minute Epsom salt soaks, antibiotic ointment and daily use of Bandaids until toe starts to dry / form eschar.    No follow-ups on file.   Ethan Saddler, DPM, AACFAS [facility]     [address] [place], [place] [address]                Office [phone]  Fax [phone]     [1]  Allergies Allergen Reactions   Tape Other (See Comments)    Red, swollen, itching   "

## 2024-08-09 ENCOUNTER — Ambulatory Visit: Admitting: Podiatry

## 2024-08-13 ENCOUNTER — Ambulatory Visit: Admitting: Podiatry

## 2024-08-13 DIAGNOSIS — M21619 Bunion of unspecified foot: Secondary | ICD-10-CM

## 2024-08-13 DIAGNOSIS — M2041 Other hammer toe(s) (acquired), right foot: Secondary | ICD-10-CM | POA: Diagnosis not present

## 2024-08-13 DIAGNOSIS — L600 Ingrowing nail: Secondary | ICD-10-CM | POA: Diagnosis not present

## 2024-08-13 NOTE — Patient Instructions (Signed)
 More silicone and felt pads can be purchased from:  [email or web address]  Look for foam toe spacers, you can also find these on amazon.

## 2024-08-13 NOTE — Progress Notes (Unsigned)
 Right hallux lateral border doing well Some scab Also complains of some mild pain with bunion associated hammertoe deformity right foot over the toes pes cavus return to gait toe pad   Follow-up as needed for diabetic footcare follow-up as needed for bunion

## 2024-08-16 ENCOUNTER — Encounter: Payer: Self-pay | Admitting: Podiatry

## 2024-09-02 ENCOUNTER — Ambulatory Visit

## 2024-09-08 ENCOUNTER — Ambulatory Visit: Admitting: Podiatry

## 2025-06-14 ENCOUNTER — Inpatient Hospital Stay: Admitting: Gynecologic Oncology
